# Patient Record
Sex: Female | Born: 1955
Health system: Southern US, Community
[De-identification: ages and names within clinical notes are randomized; demographics above are authoritative.]

## PROBLEM LIST (undated history)

## (undated) DIAGNOSIS — Z21 Asymptomatic human immunodeficiency virus [HIV] infection status: Secondary | ICD-10-CM

## (undated) DIAGNOSIS — I700 Atherosclerosis of aorta: Secondary | ICD-10-CM

## (undated) DIAGNOSIS — Z95828 Presence of other vascular implants and grafts: Secondary | ICD-10-CM

## (undated) DIAGNOSIS — I10 Essential (primary) hypertension: Secondary | ICD-10-CM

## (undated) DIAGNOSIS — K760 Fatty (change of) liver, not elsewhere classified: Secondary | ICD-10-CM

## (undated) DIAGNOSIS — B20 Human immunodeficiency virus [HIV] disease: Secondary | ICD-10-CM

## (undated) DIAGNOSIS — Z853 Personal history of malignant neoplasm of breast: Secondary | ICD-10-CM

## (undated) DIAGNOSIS — C50919 Malignant neoplasm of unspecified site of unspecified female breast: Secondary | ICD-10-CM

## (undated) HISTORY — DX: Malignant neoplasm of unspecified site of unspecified female breast: C50.919

## (undated) HISTORY — DX: Personal history of malignant neoplasm of breast: Z85.3

## (undated) HISTORY — DX: Essential (primary) hypertension: I10

## (undated) HISTORY — PX: BREAST LUMPECTOMY: SHX2

## (undated) HISTORY — DX: Human immunodeficiency virus (HIV) disease: B20

## (undated) HISTORY — DX: Asymptomatic human immunodeficiency virus (hiv) infection status: Z21

---

## 1991-11-03 HISTORY — PX: OTHER SURGICAL HISTORY: SHX169

## 1998-11-07 ENCOUNTER — Other Ambulatory Visit: Admission: RE | Admit: 1998-11-07 | Discharge: 1998-11-07 | Payer: Self-pay | Admitting: Obstetrics and Gynecology

## 2000-01-29 ENCOUNTER — Other Ambulatory Visit: Admission: RE | Admit: 2000-01-29 | Discharge: 2000-01-29 | Payer: Self-pay | Admitting: Obstetrics and Gynecology

## 2001-02-15 ENCOUNTER — Other Ambulatory Visit: Admission: RE | Admit: 2001-02-15 | Discharge: 2001-02-15 | Payer: Self-pay | Admitting: Obstetrics and Gynecology

## 2002-02-28 ENCOUNTER — Other Ambulatory Visit: Admission: RE | Admit: 2002-02-28 | Discharge: 2002-02-28 | Payer: Self-pay | Admitting: Obstetrics and Gynecology

## 2002-11-30 ENCOUNTER — Ambulatory Visit (HOSPITAL_COMMUNITY): Admission: RE | Admit: 2002-11-30 | Discharge: 2002-11-30 | Payer: Self-pay | Admitting: General Surgery

## 2002-11-30 ENCOUNTER — Encounter (INDEPENDENT_AMBULATORY_CARE_PROVIDER_SITE_OTHER): Payer: Self-pay | Admitting: *Deleted

## 2003-08-06 ENCOUNTER — Other Ambulatory Visit: Admission: RE | Admit: 2003-08-06 | Discharge: 2003-08-06 | Payer: Self-pay | Admitting: Obstetrics and Gynecology

## 2005-04-07 ENCOUNTER — Encounter: Admission: RE | Admit: 2005-04-07 | Discharge: 2005-04-07 | Payer: Self-pay | Admitting: Obstetrics and Gynecology

## 2005-04-27 ENCOUNTER — Ambulatory Visit: Payer: Self-pay | Admitting: Oncology

## 2005-05-18 ENCOUNTER — Ambulatory Visit (HOSPITAL_COMMUNITY): Admission: RE | Admit: 2005-05-18 | Discharge: 2005-05-18 | Payer: Self-pay | Admitting: Oncology

## 2005-05-26 ENCOUNTER — Ambulatory Visit (HOSPITAL_COMMUNITY): Admission: RE | Admit: 2005-05-26 | Discharge: 2005-05-26 | Payer: Self-pay | Admitting: Infectious Diseases

## 2005-05-26 ENCOUNTER — Ambulatory Visit: Payer: Self-pay | Admitting: Infectious Diseases

## 2005-05-26 ENCOUNTER — Encounter (INDEPENDENT_AMBULATORY_CARE_PROVIDER_SITE_OTHER): Payer: Self-pay | Admitting: *Deleted

## 2005-06-04 ENCOUNTER — Ambulatory Visit: Payer: Self-pay | Admitting: Infectious Diseases

## 2005-06-19 ENCOUNTER — Ambulatory Visit: Payer: Self-pay | Admitting: Infectious Diseases

## 2005-08-18 ENCOUNTER — Ambulatory Visit (HOSPITAL_COMMUNITY): Admission: RE | Admit: 2005-08-18 | Discharge: 2005-08-18 | Payer: Self-pay | Admitting: Infectious Diseases

## 2005-08-18 ENCOUNTER — Ambulatory Visit: Payer: Self-pay | Admitting: Infectious Diseases

## 2005-09-17 ENCOUNTER — Ambulatory Visit: Payer: Self-pay | Admitting: Infectious Diseases

## 2005-11-09 ENCOUNTER — Ambulatory Visit: Payer: Self-pay | Admitting: Infectious Diseases

## 2005-11-09 ENCOUNTER — Encounter (INDEPENDENT_AMBULATORY_CARE_PROVIDER_SITE_OTHER): Payer: Self-pay | Admitting: *Deleted

## 2005-11-09 ENCOUNTER — Ambulatory Visit (HOSPITAL_COMMUNITY): Admission: RE | Admit: 2005-11-09 | Discharge: 2005-11-09 | Payer: Self-pay | Admitting: Infectious Diseases

## 2005-11-09 LAB — CONVERTED CEMR LAB: HIV 1 RNA Quant: 399 copies/mL

## 2005-11-26 ENCOUNTER — Ambulatory Visit: Payer: Self-pay | Admitting: Infectious Diseases

## 2006-01-13 ENCOUNTER — Ambulatory Visit: Payer: Self-pay | Admitting: Infectious Diseases

## 2006-02-23 ENCOUNTER — Ambulatory Visit: Payer: Self-pay | Admitting: Infectious Diseases

## 2006-02-23 ENCOUNTER — Encounter (INDEPENDENT_AMBULATORY_CARE_PROVIDER_SITE_OTHER): Payer: Self-pay | Admitting: *Deleted

## 2006-02-23 ENCOUNTER — Encounter: Admission: RE | Admit: 2006-02-23 | Discharge: 2006-02-23 | Payer: Self-pay | Admitting: Infectious Diseases

## 2006-02-23 LAB — CONVERTED CEMR LAB: CD4 Count: 390 microliters

## 2006-03-11 ENCOUNTER — Ambulatory Visit: Payer: Self-pay | Admitting: Infectious Diseases

## 2006-04-09 ENCOUNTER — Ambulatory Visit: Payer: Self-pay | Admitting: Infectious Diseases

## 2006-06-09 ENCOUNTER — Encounter: Admission: RE | Admit: 2006-06-09 | Discharge: 2006-06-09 | Payer: Self-pay | Admitting: Infectious Diseases

## 2006-06-09 ENCOUNTER — Ambulatory Visit: Payer: Self-pay | Admitting: Infectious Diseases

## 2006-07-15 ENCOUNTER — Ambulatory Visit: Payer: Self-pay | Admitting: Infectious Diseases

## 2006-10-25 ENCOUNTER — Encounter: Admission: RE | Admit: 2006-10-25 | Discharge: 2006-10-25 | Payer: Self-pay | Admitting: Gastroenterology

## 2006-10-25 ENCOUNTER — Encounter (INDEPENDENT_AMBULATORY_CARE_PROVIDER_SITE_OTHER): Payer: Self-pay | Admitting: *Deleted

## 2006-10-25 ENCOUNTER — Ambulatory Visit: Payer: Self-pay | Admitting: Infectious Diseases

## 2006-10-25 LAB — CONVERTED CEMR LAB
AST: 28 units/L (ref 0–37)
Albumin: 4.2 g/dL (ref 3.5–5.2)
BUN: 14 mg/dL (ref 6–23)
Basophils Absolute: 0 10*3/uL (ref 0.0–0.1)
Basophils Relative: 0 % (ref 0–1)
CD4 Count: 350 microliters
CO2: 21 meq/L (ref 19–32)
Calcium: 9 mg/dL (ref 8.4–10.5)
Cholesterol: 174 mg/dL (ref 0–200)
Creatinine, Ser: 0.76 mg/dL (ref 0.40–1.20)
Glucose, Bld: 88 mg/dL (ref 70–99)
HCT: 39.1 % (ref 36.0–46.0)
HIV-1 RNA Quant, Log: <1.7 (ref <1.70)
Lymphs Abs: 1.3 10*3/uL (ref 0.7–3.3)
MCHC: 32 g/dL (ref 30.0–36.0)
Neutro Abs: 1.4 10*3/uL — ABNORMAL LOW (ref 1.7–7.7)
Platelets: 185 10*3/uL (ref 150–400)
Potassium: 3.9 meq/L (ref 3.5–5.3)
RBC: 4.41 M/uL (ref 3.87–5.11)
RDW: 14.7 % — ABNORMAL HIGH (ref 11.5–14.0)
WBC: 2.9 10*3/uL — ABNORMAL LOW (ref 4.0–10.5)

## 2006-11-11 ENCOUNTER — Ambulatory Visit: Payer: Self-pay | Admitting: Infectious Diseases

## 2006-12-27 ENCOUNTER — Encounter (INDEPENDENT_AMBULATORY_CARE_PROVIDER_SITE_OTHER): Payer: Self-pay | Admitting: *Deleted

## 2006-12-27 LAB — CONVERTED CEMR LAB

## 2007-01-09 ENCOUNTER — Encounter (INDEPENDENT_AMBULATORY_CARE_PROVIDER_SITE_OTHER): Payer: Self-pay | Admitting: *Deleted

## 2007-03-29 ENCOUNTER — Ambulatory Visit: Payer: Self-pay | Admitting: Infectious Diseases

## 2007-03-29 ENCOUNTER — Encounter: Admission: RE | Admit: 2007-03-29 | Discharge: 2007-03-29 | Payer: Self-pay | Admitting: Infectious Diseases

## 2007-03-29 LAB — CONVERTED CEMR LAB
ALT: 24 units/L (ref 0–35)
Basophils Relative: 0 % (ref 0–1)
Chloride: 107 meq/L (ref 96–112)
Eosinophils Relative: 2 % (ref 0–5)
Glucose, Bld: 99 mg/dL (ref 70–99)
HCT: 37.7 % (ref 36.0–46.0)
Lymphocytes Relative: 43 % (ref 12–46)
Lymphs Abs: 1.3 10*3/uL (ref 0.7–3.3)
MCHC: 32.9 g/dL (ref 30.0–36.0)
MCV: 87.3 fL (ref 78.0–100.0)
Monocytes Relative: 4 % (ref 3–11)
Neutro Abs: 1.5 10*3/uL — ABNORMAL LOW (ref 1.7–7.7)

## 2007-04-14 ENCOUNTER — Ambulatory Visit: Payer: Self-pay | Admitting: Infectious Diseases

## 2007-04-14 DIAGNOSIS — B20 Human immunodeficiency virus [HIV] disease: Secondary | ICD-10-CM

## 2007-04-25 ENCOUNTER — Ambulatory Visit (HOSPITAL_COMMUNITY): Admission: RE | Admit: 2007-04-25 | Discharge: 2007-04-25 | Payer: Self-pay | Admitting: Gastroenterology

## 2007-07-28 ENCOUNTER — Ambulatory Visit: Payer: Self-pay | Admitting: Infectious Diseases

## 2007-07-28 ENCOUNTER — Encounter: Admission: RE | Admit: 2007-07-28 | Discharge: 2007-07-28 | Payer: Self-pay | Admitting: Infectious Diseases

## 2007-07-28 LAB — CONVERTED CEMR LAB
ALT: 22 units/L (ref 0–35)
AST: 21 units/L (ref 0–37)
Alkaline Phosphatase: 149 units/L — ABNORMAL HIGH (ref 39–117)
CO2: 20 meq/L (ref 19–32)
Calcium: 9.3 mg/dL (ref 8.4–10.5)
Chloride: 109 meq/L (ref 96–112)
Eosinophils Absolute: 0.1 10*3/uL (ref 0.0–0.7)
Glucose, Bld: 95 mg/dL (ref 70–99)
HCT: 37.4 % (ref 36.0–46.0)
Lymphocytes Relative: 53 % — ABNORMAL HIGH (ref 12–46)
Lymphs Abs: 1.7 10*3/uL (ref 0.7–3.3)
Monocytes Relative: 6 % (ref 3–11)
Neutro Abs: 1.3 10*3/uL — ABNORMAL LOW (ref 1.7–7.7)
Neutrophils Relative %: 39 % — ABNORMAL LOW (ref 43–77)
Potassium: 4.1 meq/L (ref 3.5–5.3)
RBC: 4.36 M/uL (ref 3.87–5.11)
Sodium: 139 meq/L (ref 135–145)
Total Bilirubin: 0.5 mg/dL (ref 0.3–1.2)

## 2007-08-11 ENCOUNTER — Ambulatory Visit: Payer: Self-pay | Admitting: Infectious Diseases

## 2007-08-11 DIAGNOSIS — I10 Essential (primary) hypertension: Secondary | ICD-10-CM

## 2007-11-01 ENCOUNTER — Encounter: Payer: Self-pay | Admitting: Infectious Diseases

## 2007-12-07 ENCOUNTER — Ambulatory Visit: Payer: Self-pay | Admitting: Infectious Diseases

## 2007-12-07 ENCOUNTER — Encounter: Admission: RE | Admit: 2007-12-07 | Discharge: 2007-12-07 | Payer: Self-pay | Admitting: Infectious Diseases

## 2007-12-07 LAB — CONVERTED CEMR LAB
ALT: 26 units/L (ref 0–35)
BUN: 22 mg/dL (ref 6–23)
CO2: 25 meq/L (ref 19–32)
Cholesterol: 185 mg/dL (ref 0–200)
Eosinophils Absolute: 0.1 10*3/uL (ref 0.0–0.7)
Glucose, Bld: 89 mg/dL (ref 70–99)
HIV 1 RNA Quant: 98 copies/mL — ABNORMAL HIGH (ref <50)
HIV-1 RNA Quant, Log: 1.99 — ABNORMAL HIGH (ref <1.70)
MCHC: 33 g/dL (ref 30.0–36.0)
Monocytes Absolute: 0.2 10*3/uL (ref 0.1–1.0)
Neutro Abs: 1.9 10*3/uL (ref 1.7–7.7)
Platelets: 247 10*3/uL (ref 150–400)
Potassium: 4 meq/L (ref 3.5–5.3)
RBC: 4.48 M/uL (ref 3.87–5.11)
Total Bilirubin: 0.3 mg/dL (ref 0.3–1.2)
Total Protein: 8 g/dL (ref 6.0–8.3)
VLDL: 11 mg/dL (ref 0–40)

## 2007-12-08 ENCOUNTER — Telehealth: Payer: Self-pay | Admitting: Infectious Diseases

## 2007-12-22 ENCOUNTER — Ambulatory Visit: Payer: Self-pay | Admitting: Infectious Diseases

## 2008-06-11 ENCOUNTER — Encounter: Admission: RE | Admit: 2008-06-11 | Discharge: 2008-06-11 | Payer: Self-pay | Admitting: Infectious Diseases

## 2008-06-11 ENCOUNTER — Ambulatory Visit: Payer: Self-pay | Admitting: Infectious Diseases

## 2008-06-11 LAB — CONVERTED CEMR LAB
ALT: 23 units/L (ref 0–35)
AST: 27 units/L (ref 0–37)
Basophils Relative: 0 % (ref 0–1)
Calcium: 9 mg/dL (ref 8.4–10.5)
Chloride: 107 meq/L (ref 96–112)
Eosinophils Relative: 1 % (ref 0–5)
HCT: 36.5 % (ref 36.0–46.0)
HIV-1 RNA Quant, Log: 1.91 — ABNORMAL HIGH (ref <1.70)
Hemoglobin: 11.9 g/dL — ABNORMAL LOW (ref 12.0–15.0)
Lymphs Abs: 1.2 10*3/uL (ref 0.7–4.0)
MCV: 88.8 fL (ref 78.0–100.0)
Neutrophils Relative %: 58 % (ref 43–77)
RBC: 4.11 M/uL (ref 3.87–5.11)
Total Bilirubin: 0.2 mg/dL — ABNORMAL LOW (ref 0.3–1.2)
WBC: 3.4 10*3/uL — ABNORMAL LOW (ref 4.0–10.5)

## 2008-06-28 ENCOUNTER — Ambulatory Visit: Payer: Self-pay | Admitting: Infectious Diseases

## 2008-11-13 ENCOUNTER — Encounter: Payer: Self-pay | Admitting: Infectious Diseases

## 2008-11-13 ENCOUNTER — Encounter (INDEPENDENT_AMBULATORY_CARE_PROVIDER_SITE_OTHER): Payer: Self-pay | Admitting: *Deleted

## 2008-11-13 LAB — CONVERTED CEMR LAB: Pap Smear: NORMAL

## 2008-11-14 ENCOUNTER — Encounter: Payer: Self-pay | Admitting: Infectious Diseases

## 2009-05-02 ENCOUNTER — Ambulatory Visit: Payer: Self-pay | Admitting: Infectious Diseases

## 2009-05-02 LAB — CONVERTED CEMR LAB
BUN: 17 mg/dL (ref 6–23)
Basophils Relative: 0 % (ref 0–1)
CO2: 23 meq/L (ref 19–32)
Creatinine, Ser: 0.83 mg/dL (ref 0.40–1.20)
Eosinophils Absolute: 0 10*3/uL (ref 0.0–0.7)
Glucose, Bld: 96 mg/dL (ref 70–99)
HDL: 86 mg/dL (ref >39)
HIV-1 RNA Quant, Log: <1.68 (ref <1.68)
Hemoglobin: 11.8 g/dL — ABNORMAL LOW (ref 12.0–15.0)
LDL Cholesterol: 79 mg/dL (ref 0–99)
Lymphs Abs: 1.4 10*3/uL (ref 0.7–4.0)
Monocytes Absolute: 0.2 10*3/uL (ref 0.1–1.0)
Potassium: 4.3 meq/L (ref 3.5–5.3)
Sodium: 144 meq/L (ref 135–145)
Total Bilirubin: 0.3 mg/dL (ref 0.3–1.2)
Total CHOL/HDL Ratio: 2
Total Protein: 7.2 g/dL (ref 6.0–8.3)
Triglycerides: 40 mg/dL (ref <150)

## 2009-05-16 ENCOUNTER — Ambulatory Visit: Payer: Self-pay | Admitting: Infectious Diseases

## 2009-10-17 ENCOUNTER — Encounter (INDEPENDENT_AMBULATORY_CARE_PROVIDER_SITE_OTHER): Payer: Self-pay | Admitting: *Deleted

## 2009-10-17 ENCOUNTER — Ambulatory Visit: Payer: Self-pay | Admitting: Infectious Diseases

## 2009-10-17 LAB — CONVERTED CEMR LAB
BUN: 11 mg/dL (ref 6–23)
Basophils Absolute: 0 10*3/uL (ref 0.0–0.1)
Calcium: 8.9 mg/dL (ref 8.4–10.5)
Cholesterol: 189 mg/dL (ref 0–200)
Creatinine, Ser: 0.74 mg/dL (ref 0.40–1.20)
Glucose, Bld: 89 mg/dL (ref 70–99)
HCT: 37.3 % (ref 36.0–46.0)
HDL: 94 mg/dL (ref >39)
HIV-1 RNA Quant, Log: 2.1 — ABNORMAL HIGH (ref <1.68)
Hemoglobin: 12.7 g/dL (ref 12.0–15.0)
LDL Cholesterol: 85 mg/dL (ref 0–99)
MCV: 85.6 fL (ref >=78.0)
Neutrophils Relative %: 33 % — ABNORMAL LOW (ref 43–77)
Sodium: 142 meq/L (ref 135–145)
Total Bilirubin: 0.3 mg/dL (ref 0.3–1.2)
VLDL: 10 mg/dL (ref 0–40)

## 2010-02-18 ENCOUNTER — Ambulatory Visit: Payer: Self-pay | Admitting: Infectious Diseases

## 2010-02-18 LAB — CONVERTED CEMR LAB
AST: 30 units/L (ref 0–37)
Albumin: 4.1 g/dL (ref 3.5–5.2)
Alkaline Phosphatase: 155 units/L — ABNORMAL HIGH (ref 39–117)
CO2: 21 meq/L (ref 19–32)
Calcium: 9.1 mg/dL (ref 8.4–10.5)
Cholesterol: 204 mg/dL — ABNORMAL HIGH (ref 0–200)
Creatinine, Ser: 0.81 mg/dL (ref 0.40–1.20)
Eosinophils Absolute: 0.1 10*3/uL (ref 0.0–0.7)
Eosinophils Relative: 2 % (ref 0–5)
HIV 1 RNA Quant: <48 copies/mL (ref <48)
HIV-1 RNA Quant, Log: <1.68 (ref <1.68)
LDL Cholesterol: 97 mg/dL (ref 0–99)
Monocytes Relative: 5 % (ref 3–12)
Platelets: 214 10*3/uL (ref 150–400)
RDW: 15.8 % — ABNORMAL HIGH (ref 11.5–15.5)
Sodium: 141 meq/L (ref 135–145)
Total Bilirubin: 0.3 mg/dL (ref 0.3–1.2)
Total Protein: 7.3 g/dL (ref 6.0–8.3)
Triglycerides: 57 mg/dL (ref <150)

## 2010-03-20 ENCOUNTER — Ambulatory Visit: Payer: Self-pay | Admitting: Infectious Diseases

## 2010-11-24 ENCOUNTER — Encounter (INDEPENDENT_AMBULATORY_CARE_PROVIDER_SITE_OTHER): Payer: Self-pay | Admitting: *Deleted

## 2010-12-02 NOTE — Assessment & Plan Note (Signed)
Summary: 4month f/u/vs   CC:  follow-up visit.  History of Present Illness: Victoria Coffey continues to be very well controled on Atriple withmost recent [id number] and HIV<48 copies. Other blood work is normal and she feels perfectly healthy without complaints.   Preventive Screening-Counseling & Management  Alcohol-Tobacco     Alcohol drinks/day: 0     Smoking Status: never  Caffeine-Diet-Exercise     Caffeine use/day: 0     Does Patient Exercise: yes     Type of exercise: spinning     Exercise (avg: min/session): >60     Times/week: 5  Hep-HIV-STD-Contraception     HIV Risk: no risk noted  Safety-Violence-Falls     Seat Belt Use: yes  Comments: declined condoms      Sexual History:  currently monogamous.        Drug Use:  never.     Current Allergies (reviewed today): No known allergies  Social History: Sexual History:  currently monogamous  Vital Signs:  Patient profile:   55 year old female Menstrual status:  postmenopausal Height:      61 inches (154.94 cm) Weight:      128 pounds (58.18 kg) BMI:     24.27 Temp:     97.6 degrees F (36.44 degrees C) oral Pulse rate:   65 / minute BP sitting:   154 / 89  (left arm) Cuff size:   regular  Vitals Entered By: Jennet Maduro RN (Mar 20, 2010 9:50 AM) CC: follow-up visit Is Patient Diabetic? No Pain Assessment Patient in pain? no      Nutritional Status BMI of 19 -24 = normal Nutritional Status Detail appetite "GOOD"  Have you ever been in a relationship where you felt threatened, hurt or afraid?No   Does patient need assistance? Functional Status Self care Ambulation Normal Comments no missed doses of meds   Physical Exam  General:  alert and well-developed.   Mouth:  good dentition and pharynx pink and moist.   Neck:  supple, full ROM, and no masses.   Lungs:  normal respiratory effort, no intercostal retractions, and no accessory muscle use.   Heart:  normal rate and no murmur.     Impression &  Recommendations:  Problem # 1:  HIV DISEASE (ICD-042)  Orders: Est. Patient Level IV (04540) Est. Patient Level III (99213)Future Orders: T-CBC w/Diff ([id number]) ... 08/04/2010 T-CD4SP ([facility]) (CD4SP) ... 08/04/2010 T-Comprehensive Metabolic Panel [phone]) ... 08/04/2010 T-HIV Viral Load [phone]) ... 08/04/2010 Controlled and contiiue [name]. RTC 5 monhts.  Patient Instructions: 1)  Please schedule a follow-up appointment in 4-5 months. 2)  Be sure to return for lab work one (1) week before your next appointment as scheduled.    Process Orders Check Orders Results:     [facility]: ABN not required for this insurance Tests Sent for requisitioning (Mar 20, 2010 12:45 PM):     08/04/2010: [facility] -- T-CBC w/Diff [[id number]] (signed)     08/04/2010: [facility] -- T-Comprehensive Metabolic Panel [[id number]] (signed)     08/04/2010: [facility] -- T-HIV Viral Load [phone] (signed)

## 2010-12-02 NOTE — Miscellaneous (Signed)
Summary: Orders Update - labs  Clinical Lists Changes  Problems: Added new problem of ENCOUNTER FOR LONG-TERM USE OF OTHER MEDICATIONS (ICD-V58.69) Orders: Added new Test order of T-CBC w/Diff [phone]) - Signed Added new Test order of T-CD4SP [facility]) (CD4SP) - Signed Added new Test order of T-Comprehensive Metabolic Panel [phone]) - Signed Added new Test order of T-HIV Viral Load [phone]) - Signed Added new Test order of T-RPR (Syphilis) [phone]) - Signed Added new Test order of T-Lipid Profile ([id number]) - Signed     Process Orders Check Orders Results:     [facility]: ABN not required for this insurance Order queued for requisitioning for [facility]: February 18, 2010 8:36 AM  Tests Sent for requisitioning (February 18, 2010 8:36 AM):     02/18/2010: [facility] -- T-CBC w/Diff [[id number]] (signed)     02/18/2010: [facility] -- T-Comprehensive Metabolic Panel [[id number]] (signed)     02/18/2010: [facility] -- T-HIV Viral Load [phone] (signed)     02/18/2010: [facility] -- T-RPR (Syphilis) [phone] (signed)     02/18/2010: [facility] -- T-Lipid Profile [phone] (signed)

## 2010-12-04 NOTE — Miscellaneous (Signed)
Summary: RW Update  Clinical Lists Changes  Observations: Added new observation of INCOMESOURCE: unknown (11/24/2010 12:00) Added new observation of HOUSEINCOME: 0  (11/24/2010 12:00)

## 2011-01-20 LAB — T-HELPER CELL (CD4) - (RCID CLINIC ONLY)
CD4 % Helper T Cell: 35 % (ref 33–55)
CD4 T Cell Abs: 540 uL (ref 400–2700)

## 2011-02-02 LAB — T-HELPER CELL (CD4) - (RCID CLINIC ONLY): CD4 % Helper T Cell: 36 % (ref 33–55)

## 2011-02-08 LAB — T-HELPER CELL (CD4) - (RCID CLINIC ONLY): CD4 % Helper T Cell: 35 % (ref 33–55)

## 2011-03-20 NOTE — Op Note (Signed)
   NAME:  Victoria Coffey, Victoria Coffey                           ACCOUNT NO.:  [id number]   MEDICAL RECORD NO.:  [id number]                   PATIENT TYPE:  OIB   LOCATION:  2899                                 FACILITY:  [facility]   PHYSICIAN:  Leonie Man, M.D.                DATE OF BIRTH:  03-06-1956   DATE OF PROCEDURE:  11/30/2002  DATE OF DISCHARGE:                                 OPERATIVE REPORT   PREOPERATIVE DIAGNOSIS:  Left cervical lymphadenopathy, rule out lymphoma.   POSTOPERATIVE DIAGNOSIS:  Left cervical lymphadenopathy, rule out lymphoma.  Pathology pending.   OPERATION PERFORMED:  Excisional biopsy of left cervical lymph nodes.   SURGEON:  Leonie Man, M.D.   ASSISTANT:  Nurse.   ANESTHESIA:  General.   INDICATIONS FOR PROCEDURE:  The patient is a 55 year old woman who is status  post left-sided breast cancer approximately ten years ago and had been on  Tamoxifen and did well.  She presents now with bilateral cervical  lymphadenopathy without pain, treated briefly with antibiotics without any  resolution and comes now to the operating room for biopsy of lymph nodes on  the left side.  The risks and potential benefits of surgery have been fully  discussed with the patient and her spouse.  They understand these risks.  All questions answered and consent obtained.   DESCRIPTION OF PROCEDURE:  Following induction of satisfactory general  anesthesia with the head and neck turned to the right, the neck was prepped  and draped to be included in a sterile operative field.  I infiltrated the  region over the lymph nodes with 0.5% Marcaine with epinephrine.  I made a  transverse incision in the skin and deepened this through the skin and  subcutaneous tissue carrying dissection down to the region of the lymph  nodes.  A lymph nodes was dissected free on all sides and removed, forwarded  for pathologic evaluation.  Two separate lymph nodes were removed.  Hemostasis was assured with  electrocautery.  Sponge and instrument count  verified.  The subcutaneous tissues were then closed with interrupted 3-0  Vicryl sutures and the skin closed with a running 5-0 Monocryl.  This was  then reinforced with Steri-Strips.  A sterile dressing applied.  Anesthetic  was reversed and the patient removed from the operating room to the recovery  room in stable condition, having tolerated the procedure well.                                                Leonie Man, M.D.    PB/MEDQ  D:  11/30/2002  T:  11/30/2002  Job:  [id number]

## 2011-04-01 ENCOUNTER — Other Ambulatory Visit: Payer: Self-pay | Admitting: *Deleted

## 2011-04-01 DIAGNOSIS — B20 Human immunodeficiency virus [HIV] disease: Secondary | ICD-10-CM

## 2011-04-01 DIAGNOSIS — Z21 Asymptomatic human immunodeficiency virus [HIV] infection status: Secondary | ICD-10-CM

## 2011-04-01 MED ORDER — EFAVIRENZ-EMTRICITAB-TENOFOVIR 600-200-300 MG PO TABS: 1.0000 | ORAL_TABLET | Freq: Every day | ORAL | Status: DC

## 2011-06-25 ENCOUNTER — Other Ambulatory Visit: Payer: Self-pay | Admitting: Infectious Diseases

## 2011-06-25 ENCOUNTER — Other Ambulatory Visit: Payer: Self-pay

## 2011-06-25 DIAGNOSIS — B20 Human immunodeficiency virus [HIV] disease: Secondary | ICD-10-CM

## 2011-06-25 LAB — CBC WITH DIFFERENTIAL/PLATELET
Basophils Absolute: 0 10*3/uL (ref 0.0–0.1)
HCT: 38.7 % (ref 36.0–46.0)
Lymphocytes Relative: 40 % (ref 12–46)
Lymphs Abs: 1.3 10*3/uL (ref 0.7–4.0)
Monocytes Absolute: 0.2 10*3/uL (ref 0.1–1.0)
Neutro Abs: 1.6 10*3/uL — ABNORMAL LOW (ref 1.7–7.7)
RBC: 4.43 MIL/uL (ref 3.87–5.11)
RDW: 15.2 % (ref 11.5–15.5)
WBC: 3.2 10*3/uL — ABNORMAL LOW (ref 4.0–10.5)

## 2011-06-25 LAB — COMPLETE METABOLIC PANEL WITH GFR
ALT: 21 U/L (ref 0–35)
AST: 27 U/L (ref 0–37)
Albumin: 4 g/dL (ref 3.5–5.2)
BUN: 17 mg/dL (ref 6–23)
Calcium: 9.5 mg/dL (ref 8.4–10.5)
Chloride: 107 mEq/L (ref 96–112)
Potassium: 4.8 mEq/L (ref 3.5–5.3)

## 2011-06-26 LAB — HIV-1 RNA QUANT-NO REFLEX-BLD
HIV 1 RNA Quant: <20 copies/mL (ref <20)
HIV-1 RNA Quant, Log: <1.3 {Log} (ref <1.30)

## 2011-07-31 LAB — T-HELPER CELL (CD4) - (RCID CLINIC ONLY)
CD4 % Helper T Cell: 33
CD4 T Cell Abs: 350 — ABNORMAL LOW

## 2011-08-13 LAB — T-HELPER CELL (CD4) - (RCID CLINIC ONLY): CD4 % Helper T Cell: 36

## 2011-08-27 ENCOUNTER — Encounter: Payer: Self-pay | Admitting: Infectious Diseases

## 2011-08-27 ENCOUNTER — Ambulatory Visit: Payer: 59 | Admitting: Infectious Diseases

## 2011-08-27 VITALS — BP 158/86 | HR 66 | Temp 97.5°F | Wt 128.0 lb

## 2011-08-27 DIAGNOSIS — B20 Human immunodeficiency virus [HIV] disease: Secondary | ICD-10-CM

## 2011-08-27 NOTE — Patient Instructions (Signed)
Please return in 4-5 months, labs 2 weeks prior

## 2011-09-28 ENCOUNTER — Other Ambulatory Visit: Payer: Self-pay | Admitting: *Deleted

## 2011-09-28 DIAGNOSIS — B20 Human immunodeficiency virus [HIV] disease: Secondary | ICD-10-CM

## 2011-09-28 MED ORDER — EFAVIRENZ-EMTRICITAB-TENOFOVIR 600-200-300 MG PO TABS: 1.0000 | ORAL_TABLET | Freq: Every day | ORAL | Status: DC

## 2012-01-27 ENCOUNTER — Other Ambulatory Visit: Payer: Self-pay | Admitting: Infectious Diseases

## 2012-01-27 DIAGNOSIS — Z79899 Other long term (current) drug therapy: Secondary | ICD-10-CM

## 2012-01-27 DIAGNOSIS — Z113 Encounter for screening for infections with a predominantly sexual mode of transmission: Secondary | ICD-10-CM

## 2012-02-05 ENCOUNTER — Other Ambulatory Visit: Payer: Self-pay | Admitting: *Deleted

## 2012-02-05 DIAGNOSIS — Z113 Encounter for screening for infections with a predominantly sexual mode of transmission: Secondary | ICD-10-CM

## 2012-03-31 ENCOUNTER — Other Ambulatory Visit: Payer: Self-pay | Admitting: *Deleted

## 2012-03-31 ENCOUNTER — Other Ambulatory Visit: Payer: 59

## 2012-03-31 DIAGNOSIS — Z113 Encounter for screening for infections with a predominantly sexual mode of transmission: Secondary | ICD-10-CM

## 2012-03-31 DIAGNOSIS — B20 Human immunodeficiency virus [HIV] disease: Secondary | ICD-10-CM

## 2012-03-31 DIAGNOSIS — Z79899 Other long term (current) drug therapy: Secondary | ICD-10-CM

## 2012-03-31 LAB — CBC WITH DIFFERENTIAL/PLATELET
Basophils Absolute: 0 K/uL (ref 0.0–0.1)
Basophils Relative: 1 % (ref 0–1)
Eosinophils Absolute: 0.1 K/uL (ref 0.0–0.7)
Eosinophils Relative: 1 % (ref 0–5)
HCT: 37.9 % (ref 36.0–46.0)
Hemoglobin: 12.2 g/dL (ref 12.0–15.0)
Lymphocytes Relative: 40 % (ref 12–46)
Lymphs Abs: 1.4 K/uL (ref 0.7–4.0)
MCH: 28.6 pg (ref 26.0–34.0)
MCHC: 32.2 g/dL (ref 30.0–36.0)
MCV: 89 fL (ref 78.0–100.0)
Monocytes Absolute: 0.1 K/uL (ref 0.1–1.0)
Monocytes Relative: 3 % (ref 3–12)
Neutro Abs: 2 K/uL (ref 1.7–7.7)
Neutrophils Relative %: 55 % (ref 43–77)
Platelets: 229 K/uL (ref 150–400)
RBC: 4.26 MIL/uL (ref 3.87–5.11)
RDW: 15.6 % — ABNORMAL HIGH (ref 11.5–15.5)
WBC: 3.6 K/uL — ABNORMAL LOW (ref 4.0–10.5)

## 2012-03-31 LAB — LIPID PANEL
HDL: 77 mg/dL (ref >39)
LDL Cholesterol: 108 mg/dL — ABNORMAL HIGH (ref 0–99)
Total CHOL/HDL Ratio: 2.5 Ratio
Triglycerides: 44 mg/dL (ref <150)
VLDL: 9 mg/dL (ref 0–40)

## 2012-03-31 LAB — COMPLETE METABOLIC PANEL WITH GFR
ALT: 17 U/L (ref 0–35)
AST: 21 U/L (ref 0–37)
Albumin: 4.1 g/dL (ref 3.5–5.2)
Alkaline Phosphatase: 141 U/L — ABNORMAL HIGH (ref 39–117)
Calcium: 9 mg/dL (ref 8.4–10.5)
Chloride: 107 mEq/L (ref 96–112)
Potassium: 4.2 mEq/L (ref 3.5–5.3)
Sodium: 139 mEq/L (ref 135–145)
Total Protein: 6.9 g/dL (ref 6.0–8.3)

## 2012-03-31 MED ORDER — EFAVIRENZ-EMTRICITAB-TENOFOVIR 600-200-300 MG PO TABS: 1.0000 | ORAL_TABLET | Freq: Every day | ORAL | Status: DC

## 2012-03-31 NOTE — Telephone Encounter (Signed)
Patient in today for labs and advised she needs refills.

## 2012-04-01 LAB — HIV-1 RNA QUANT-NO REFLEX-BLD: HIV 1 RNA Quant: <20 copies/mL (ref <20)

## 2012-04-14 ENCOUNTER — Ambulatory Visit: Payer: 59 | Admitting: Infectious Diseases

## 2012-04-14 ENCOUNTER — Ambulatory Visit (INDEPENDENT_AMBULATORY_CARE_PROVIDER_SITE_OTHER): Payer: 59 | Admitting: Infectious Diseases

## 2012-04-14 ENCOUNTER — Encounter: Payer: Self-pay | Admitting: Infectious Diseases

## 2012-04-14 ENCOUNTER — Other Ambulatory Visit: Payer: Self-pay | Admitting: Infectious Diseases

## 2012-04-14 VITALS — BP 162/92 | HR 76 | Temp 97.2°F | Ht 61.0 in | Wt 130.0 lb

## 2012-04-14 DIAGNOSIS — B20 Human immunodeficiency virus [HIV] disease: Secondary | ICD-10-CM

## 2012-04-14 DIAGNOSIS — I10 Essential (primary) hypertension: Secondary | ICD-10-CM

## 2012-04-14 NOTE — Progress Notes (Signed)
Patient ID: Victoria Coffey, female   DOB: 04-07-1956, 56 y.o.   MRN: [id number] HIV f/u note   Victoria Coffey is perfectly healthy w/o complaints. She remains HIV suppressed on [name] and her screening labs and lipids are all acceptable. She does however have "white coat HTN" On arrival today her BP was 162/92 which I have documented on occasion in past and she never continued on meds. Her BPs in her gyn physician office have been more normal. She wants to wait on Rx and see if she can lose some weight. Suggested [employer] on line. Exam:BP 162/92  Pulse 76  Temp 97.2 F (36.2 C) (Oral)  Ht 5\' 1"  (1.549 m)  Wt 130 lb (58.968 kg)  BMI 24.56 kg/m2 No adenopathy. No rashes or thrush Impression Suppressed HIV and will continue [name] and f/u labs in 4 months and simply follow BP. [name]

## 2012-04-15 ENCOUNTER — Encounter: Payer: Self-pay | Admitting: *Deleted

## 2012-04-15 NOTE — Progress Notes (Signed)
Patient ID: Victoria Coffey, female   DOB: June 03, 1956, 56 y.o.   MRN: [id number] Documentation from Dr. Noland Fordyce that pt's annual PAP smear on 01/28/12 was negative.  Shared with Dr. Maurice March who signed the document.

## 2012-07-07 ENCOUNTER — Other Ambulatory Visit: Payer: 59

## 2012-07-07 DIAGNOSIS — B20 Human immunodeficiency virus [HIV] disease: Secondary | ICD-10-CM

## 2012-07-07 LAB — CBC WITH DIFFERENTIAL/PLATELET
Eosinophils Absolute: 0 10*3/uL (ref 0.0–0.7)
Eosinophils Relative: 1 % (ref 0–5)
Lymphs Abs: 1.4 10*3/uL (ref 0.7–4.0)
MCH: 28.9 pg (ref 26.0–34.0)
MCV: 86.3 fL (ref 78.0–100.0)
Monocytes Absolute: 0.2 10*3/uL (ref 0.1–1.0)
Monocytes Relative: 5 % (ref 3–12)
Platelets: 251 10*3/uL (ref 150–400)
RBC: 4.6 MIL/uL (ref 3.87–5.11)

## 2012-07-07 LAB — COMPREHENSIVE METABOLIC PANEL
Alkaline Phosphatase: 165 U/L — ABNORMAL HIGH (ref 39–117)
Glucose, Bld: 98 mg/dL (ref 70–99)
Sodium: 137 mEq/L (ref 135–145)
Total Bilirubin: 0.3 mg/dL (ref 0.3–1.2)
Total Protein: 7.2 g/dL (ref 6.0–8.3)

## 2012-07-08 LAB — T-HELPER CELL (CD4) - (RCID CLINIC ONLY): CD4 % Helper T Cell: 41 % (ref 33–55)

## 2012-07-22 ENCOUNTER — Ambulatory Visit (INDEPENDENT_AMBULATORY_CARE_PROVIDER_SITE_OTHER): Payer: 59 | Admitting: Infectious Diseases

## 2012-07-22 ENCOUNTER — Encounter: Payer: Self-pay | Admitting: Infectious Diseases

## 2012-07-22 VITALS — BP 142/91 | HR 74 | Temp 97.3°F | Ht 61.0 in | Wt 125.0 lb

## 2012-07-22 DIAGNOSIS — Z23 Encounter for immunization: Secondary | ICD-10-CM

## 2012-07-22 NOTE — Progress Notes (Signed)
Patient ID: Victoria Coffey, female   DOB: [birth year], 56 y.o.   MRN: [id number] HIV f/u  [name] has been well without any complaints and her preventive care is up to date and received flu vaccine today. She is entirely adherent to her [name] and her CD4 is 520 and HIV <20 copies which has been the pattern for the past several years. She has hypertension and takes ACEI + low dose HCTZ and visit BPs are always borderline at 140 +/80+ but are normal when taken at home.  BP 142/91  Pulse 74  Temp 97.3 F (36.3 C) (Oral)  Ht 5\' 1"  (1.549 m)  Wt 125 lb (56.7 kg)  BMI 23.62 kg/m2 Assessment HIV controlled and continue present regimen and same for hypertension. She may need more aggressive BP control as she ages. [name] will be cared for in the future by my new ID partner, Dr. Judyann Munson, anticipating my full retirement next summer. Scheduled for revisit 5-6 months with Dr. Drue Second. [name]

## 2012-07-22 NOTE — Patient Instructions (Addendum)
Please schedule 5 month ov with Dr. Drue Second, labs 2 weeks prior

## 2012-11-18 ENCOUNTER — Other Ambulatory Visit: Payer: 59

## 2012-12-02 ENCOUNTER — Ambulatory Visit: Payer: 59 | Admitting: Infectious Diseases

## 2012-12-06 ENCOUNTER — Other Ambulatory Visit (INDEPENDENT_AMBULATORY_CARE_PROVIDER_SITE_OTHER): Payer: 59

## 2012-12-06 DIAGNOSIS — B20 Human immunodeficiency virus [HIV] disease: Secondary | ICD-10-CM

## 2012-12-06 LAB — COMPREHENSIVE METABOLIC PANEL
AST: 34 U/L (ref 0–37)
Albumin: 4.1 g/dL (ref 3.5–5.2)
BUN: 15 mg/dL (ref 6–23)
Calcium: 9.6 mg/dL (ref 8.4–10.5)
Chloride: 108 mEq/L (ref 96–112)
Glucose, Bld: 92 mg/dL (ref 70–99)
Potassium: 4.8 mEq/L (ref 3.5–5.3)
Sodium: 140 mEq/L (ref 135–145)
Total Protein: 6.8 g/dL (ref 6.0–8.3)

## 2012-12-06 LAB — CBC WITH DIFFERENTIAL/PLATELET
HCT: 36.8 % (ref 36.0–46.0)
Hemoglobin: 12.4 g/dL (ref 12.0–15.0)
Lymphocytes Relative: 43 % (ref 12–46)
Monocytes Absolute: 0.2 10*3/uL (ref 0.1–1.0)
Monocytes Relative: 5 % (ref 3–12)
Neutro Abs: 1.6 10*3/uL — ABNORMAL LOW (ref 1.7–7.7)
RBC: 4.3 MIL/uL (ref 3.87–5.11)
WBC: 3.2 10*3/uL — ABNORMAL LOW (ref 4.0–10.5)

## 2012-12-07 LAB — T-HELPER CELL (CD4) - (RCID CLINIC ONLY)
CD4 % Helper T Cell: 39 % (ref 33–55)
CD4 T Cell Abs: 560 uL (ref 400–2700)

## 2012-12-07 LAB — HIV-1 RNA QUANT-NO REFLEX-BLD: HIV 1 RNA Quant: 41 copies/mL — ABNORMAL HIGH (ref <20)

## 2012-12-20 ENCOUNTER — Encounter: Payer: Self-pay | Admitting: Internal Medicine

## 2012-12-20 ENCOUNTER — Ambulatory Visit (INDEPENDENT_AMBULATORY_CARE_PROVIDER_SITE_OTHER): Payer: 59 | Admitting: Internal Medicine

## 2012-12-20 VITALS — BP 165/99 | HR 66 | Temp 97.5°F | Ht 60.0 in | Wt 133.0 lb

## 2012-12-20 DIAGNOSIS — B20 Human immunodeficiency virus [HIV] disease: Secondary | ICD-10-CM

## 2012-12-20 DIAGNOSIS — Z853 Personal history of malignant neoplasm of breast: Secondary | ICD-10-CM | POA: Insufficient documentation

## 2012-12-20 DIAGNOSIS — Z21 Asymptomatic human immunodeficiency virus [HIV] infection status: Secondary | ICD-10-CM

## 2012-12-20 MED ORDER — BENAZEPRIL-HYDROCHLOROTHIAZIDE 10-12.5 MG PO TABS: 1.0000 | ORAL_TABLET | Freq: Every day | ORAL | Status: DC

## 2012-12-20 MED ORDER — EFAVIRENZ-EMTRICITAB-TENOFOVIR 600-200-300 MG PO TABS: 1.0000 | ORAL_TABLET | Freq: Every day | ORAL | Status: DC

## 2012-12-20 NOTE — Progress Notes (Signed)
[facility]  RFV: routine visit Subjective:    Patient ID: Victoria Coffey, female    DOB: 1956-07-24, 57 y.o.   MRN: [id number]  HPI Victoria Coffey is a 57yo F with HIV and hx of breast ca,CD 4 count of 560/ VL 41, previously < 20, currently on atripla, 100% adherence. Started taking ARt at time of diagnosis. No Side effects associated with atripla.  Also has HTN, for which she has been trying to do diet control.  Increasing sterssor: her father passed away 3 years ago and her mother still having difficulty adjusting. Her mother resides in [place] but has been staying with her and her sister most recently.  Current Outpatient Prescriptions on File Prior to Visit  Medication Sig Dispense Refill  . efavirenz-emtrictabine-tenofovir ([name]) 600-200-300 MG per tablet Take 1 tablet by mouth daily.  30 tablet  11  . benazepril-hydrochlorthiazide (LOTENSIN HCT) 10-12.5 MG per tablet Take 1 tablet by mouth daily.         No current facility-administered medications on file prior to visit.   Active Ambulatory Problems    Diagnosis Date Noted  . HIV DISEASE 04/14/2007  . HYPERTENSION, BENIGN ESSENTIAL 08/11/2007   Resolved Ambulatory Problems    Diagnosis Date Noted  . No Resolved Ambulatory Problems   No Additional Past Medical History     Review of Systems     Objective:   Physical Exam BP 165/99  Pulse 66  Temp(Src) 97.5 F (36.4 C) (Oral)  Ht 5' (1.524 m)  Wt 133 lb (60.328 kg)  BMI 25.97 kg/m2 Physical Exam  Constitutional:  oriented to person, place, and time. appears well-developed and well-nourished. No distress.  HENT:  Mouth/Throat: Oropharynx is clear and moist. No oropharyngeal exudate.  Cardiovascular: Normal rate, regular rhythm and normal heart sounds. Exam reveals no gallop and no friction rub.  No murmur heard.  Pulmonary/Chest: Effort normal and breath sounds normal. No respiratory distress.  no wheezes.  Abdominal: Soft. Bowel sounds are normal. He exhibits no  distension. There is no tenderness.  Lymphadenopathy: mild subcm non tendner, mobile LN left ant cervical chain, upper Skin: Skin is warm and dry. No rash noted. No erythema.        Assessment & Plan:  HIV= continue with atripla  htn =start medication lisinopril 10/hctz 12.5. She plans to follow up with pcp in 4-6wk to adjust dose  Health maintenance = getting pap and mammo  in May; will need to get colon, prefers someone other than [facility]  rtc 6months   [facility] -> getting new pcp since her physician left/retired

## 2013-06-06 ENCOUNTER — Other Ambulatory Visit (INDEPENDENT_AMBULATORY_CARE_PROVIDER_SITE_OTHER): Payer: 59

## 2013-06-06 DIAGNOSIS — B20 Human immunodeficiency virus [HIV] disease: Secondary | ICD-10-CM

## 2013-06-06 LAB — CBC WITH DIFFERENTIAL/PLATELET
Basophils Relative: 0 % (ref 0–1)
Eosinophils Absolute: 0.1 10*3/uL (ref 0.0–0.7)
Eosinophils Relative: 3 % (ref 0–5)
Hemoglobin: 13 g/dL (ref 12.0–15.0)
MCH: 28.8 pg (ref 26.0–34.0)
MCHC: 34.2 g/dL (ref 30.0–36.0)
MCV: 84.3 fL (ref 78.0–100.0)
Monocytes Relative: 4 % (ref 3–12)
Neutrophils Relative %: 42 % — ABNORMAL LOW (ref 43–77)

## 2013-06-06 LAB — COMPLETE METABOLIC PANEL WITH GFR
ALT: 23 U/L (ref 0–35)
AST: 27 U/L (ref 0–37)
Alkaline Phosphatase: 130 U/L — ABNORMAL HIGH (ref 39–117)
Creat: 0.97 mg/dL (ref 0.50–1.10)
Total Bilirubin: 0.3 mg/dL (ref 0.3–1.2)

## 2013-06-07 LAB — HIV-1 RNA QUANT-NO REFLEX-BLD: HIV-1 RNA Quant, Log: <1.3 {Log} (ref <1.30)

## 2013-06-07 LAB — T-HELPER CELL (CD4) - (RCID CLINIC ONLY): CD4 T Cell Abs: 590 uL (ref 400–2700)

## 2013-06-20 ENCOUNTER — Encounter: Payer: Self-pay | Admitting: Internal Medicine

## 2013-06-20 ENCOUNTER — Ambulatory Visit (INDEPENDENT_AMBULATORY_CARE_PROVIDER_SITE_OTHER): Payer: 59 | Admitting: Internal Medicine

## 2013-06-20 VITALS — BP 125/76 | HR 78 | Temp 97.2°F | Wt 131.0 lb

## 2013-06-20 DIAGNOSIS — I10 Essential (primary) hypertension: Secondary | ICD-10-CM

## 2013-06-20 DIAGNOSIS — B20 Human immunodeficiency virus [HIV] disease: Secondary | ICD-10-CM

## 2013-06-20 DIAGNOSIS — Z21 Asymptomatic human immunodeficiency virus [HIV] infection status: Secondary | ICD-10-CM

## 2013-06-20 MED ORDER — EFAVIRENZ-EMTRICITAB-TENOFOVIR 600-200-300 MG PO TABS: 1.0000 | ORAL_TABLET | Freq: Every day | ORAL | Status: DC

## 2013-06-20 NOTE — Progress Notes (Signed)
[facility]  RFV: routine, 6 mo visit Subjective:    Patient ID: Victoria Coffey, female    DOB: August 21, 1956, 57 y.o.   MRN: [id number]  HPI Victoria Coffey is 57 yo F with HIV, CD 4 count of 590/VL<20, currently on [name]. Well controlled with excellent adherence. She denies any difficulties with her sleep in association with atripla. Doing well overall. No fever, chills, nightsweats, headache, weight loss, n/v/d, arthralgias  Current Outpatient Prescriptions on File Prior to Visit  Medication Sig Dispense Refill  . benazepril-hydrochlorthiazide (LOTENSIN HCT) 10-12.5 MG per tablet Take 1 tablet by mouth daily.  30 tablet  11  . efavirenz-emtricitabine-tenofovir ([name]) 600-200-300 MG per tablet Take 1 tablet by mouth daily.  60 tablet  6   No current facility-administered medications on file prior to visit.   Active Ambulatory Problems    Diagnosis Date Noted  . HIV DISEASE 04/14/2007  . HYPERTENSION, BENIGN ESSENTIAL 08/11/2007  . History of breast cancer 12/20/2012   Resolved Ambulatory Problems    Diagnosis Date Noted  . No Resolved Ambulatory Problems   No Additional Past Medical History     Review of Systems Review of Systems  Constitutional: Negative for fever, chills, diaphoresis, activity change, appetite change, fatigue and unexpected weight change.  HENT: Negative for congestion, sore throat, rhinorrhea, sneezing, trouble swallowing and sinus pressure.  Eyes: Negative for photophobia and visual disturbance.  Respiratory: Negative for cough, chest tightness, shortness of breath, wheezing and stridor.  Cardiovascular: Negative for chest pain, palpitations and leg swelling.  Gastrointestinal: Negative for nausea, vomiting, abdominal pain, diarrhea, constipation, blood in stool, abdominal distention and anal bleeding.  Genitourinary: Negative for dysuria, hematuria, flank pain and difficulty urinating.  Musculoskeletal: Negative for myalgias, back pain, joint swelling,  arthralgias and gait problem.  Skin: Negative for color change, pallor, rash and wound.  Neurological: Negative for dizziness, tremors, weakness and light-headedness.  Hematological: Negative for adenopathy. Does not bruise/bleed easily.  Psychiatric/Behavioral: Negative for behavioral problems, confusion, sleep disturbance, dysphoric mood, decreased concentration and agitation.       Objective:   Physical Exam BP 125/76  Pulse 78  Temp(Src) 97.2 F (36.2 C) (Oral)  Wt 131 lb (59.421 kg)  BMI 25.58 kg/m2  Constitutional: oriented to person, place, and time. appears well-developed and well-nourished. No distress.  HENT:  Mouth/Throat: Oropharynx is clear and moist. No oropharyngeal exudate.  Cardiovascular: Normal rate, regular rhythm and normal heart sounds. Exam reveals no gallop and no friction rub.  No murmur heard.  Pulmonary/Chest: Effort normal and breath sounds normal. No respiratory distress. no wheezes.  Abdominal: Soft. Bowel sounds are normal. exhibits no distension. There is no tenderness.  Lymphadenopathy:  no cervical adenopathy.  Neurological:  alert and oriented to person, place, and time.  Skin: Skin is warm and dry. No rash noted. No erythema.  Psychiatric:  a normal mood and affect.  behavior is normal.          Assessment & Plan:  HIV = continue with atripla  Health maintenance=  Will need to get flu vaccine in the Fall. Also will check if she is up for tetanus. Will need to schedule colonoscopy. Had 1st colonoscopy at 57yo. In setting of  Diagnosis/treatment of breast ca. Has been doing fecal occult blood cards in last 5 years.  Women's health/breast ca surveillance = getting pap and mammo through gyn/Dr. Yolanda Bonine in early Sept  HTN =well controlled on current medication  Return in 2-3 months for flu shot, then  to switch over to q 4month visits

## 2013-09-07 ENCOUNTER — Other Ambulatory Visit: Payer: Self-pay

## 2013-09-13 ENCOUNTER — Other Ambulatory Visit: Payer: 59

## 2013-09-13 DIAGNOSIS — Z113 Encounter for screening for infections with a predominantly sexual mode of transmission: Secondary | ICD-10-CM

## 2013-09-13 DIAGNOSIS — Z79899 Other long term (current) drug therapy: Secondary | ICD-10-CM

## 2013-09-13 DIAGNOSIS — B20 Human immunodeficiency virus [HIV] disease: Secondary | ICD-10-CM

## 2013-09-13 LAB — COMPREHENSIVE METABOLIC PANEL
Albumin: 4.1 g/dL (ref 3.5–5.2)
Alkaline Phosphatase: 144 U/L — ABNORMAL HIGH (ref 39–117)
CO2: 25 mEq/L (ref 19–32)
Calcium: 9.6 mg/dL (ref 8.4–10.5)
Chloride: 108 mEq/L (ref 96–112)
Glucose, Bld: 96 mg/dL (ref 70–99)
Potassium: 4.2 mEq/L (ref 3.5–5.3)
Sodium: 140 mEq/L (ref 135–145)
Total Protein: 7.1 g/dL (ref 6.0–8.3)

## 2013-09-13 LAB — CBC WITH DIFFERENTIAL/PLATELET
Basophils Absolute: 0 10*3/uL (ref 0.0–0.1)
Eosinophils Relative: 2 % (ref 0–5)
HCT: 37.3 % (ref 36.0–46.0)
Hemoglobin: 12.6 g/dL (ref 12.0–15.0)
Lymphocytes Relative: 55 % — ABNORMAL HIGH (ref 12–46)
Lymphs Abs: 1.6 10*3/uL (ref 0.7–4.0)
MCH: 29.1 pg (ref 26.0–34.0)
MCV: 86.1 fL (ref 78.0–100.0)
Monocytes Absolute: 0.2 10*3/uL (ref 0.1–1.0)
Monocytes Relative: 5 % (ref 3–12)
Neutro Abs: 1.1 10*3/uL — ABNORMAL LOW (ref 1.7–7.7)
Neutrophils Relative %: 38 % — ABNORMAL LOW (ref 43–77)
RBC: 4.33 MIL/uL (ref 3.87–5.11)

## 2013-09-13 LAB — LIPID PANEL
Cholesterol: 205 mg/dL — ABNORMAL HIGH (ref 0–200)
LDL Cholesterol: 103 mg/dL — ABNORMAL HIGH (ref 0–99)
Total CHOL/HDL Ratio: 2.2 Ratio
Triglycerides: 48 mg/dL (ref <150)

## 2013-09-13 LAB — RPR

## 2013-09-14 LAB — T-HELPER CELL (CD4) - (RCID CLINIC ONLY)
CD4 % Helper T Cell: 38 % (ref 33–55)
CD4 T Cell Abs: 670 /uL (ref 400–2700)

## 2013-09-15 LAB — HIV-1 RNA QUANT-NO REFLEX-BLD
HIV 1 RNA Quant: 45 copies/mL — ABNORMAL HIGH (ref <20)
HIV-1 RNA Quant, Log: 1.65 {Log} — ABNORMAL HIGH (ref <1.30)

## 2013-09-26 ENCOUNTER — Ambulatory Visit: Payer: 59 | Admitting: Internal Medicine

## 2013-10-04 ENCOUNTER — Encounter: Payer: Self-pay | Admitting: Internal Medicine

## 2013-10-04 ENCOUNTER — Ambulatory Visit (INDEPENDENT_AMBULATORY_CARE_PROVIDER_SITE_OTHER): Payer: 59 | Admitting: Internal Medicine

## 2013-10-04 VITALS — BP 148/88 | HR 69 | Temp 97.9°F | Wt 131.0 lb

## 2013-10-04 DIAGNOSIS — B20 Human immunodeficiency virus [HIV] disease: Secondary | ICD-10-CM

## 2013-10-04 NOTE — Progress Notes (Signed)
   Subjective:    Patient ID: Victoria Coffey, female    DOB: [birth year], 57 y.o.   MRN: [id number]  HPI Victoria Coffey is 57 yo F with HIV, CD 4 count of 670/VL<20, currently on [name]. Well controlled with excellent adherence. She denies any difficulties with her sleep in association with atripla. Doing well overall. No fever, chills, nightsweats, headache, weight loss, n/v/d, arthralgias. She has been following up with her pcp and oncologist for cancer screening. She states that her imaging has all been normal including pap smear.  Current Outpatient Prescriptions on File Prior to Visit  Medication Sig Dispense Refill  . benazepril-hydrochlorthiazide (LOTENSIN HCT) 10-12.5 MG per tablet Take 1 tablet by mouth daily.  30 tablet  11  . efavirenz-emtricitabine-tenofovir ([name]) 600-200-300 MG per tablet Take 1 tablet by mouth daily.  30 tablet  11   No current facility-administered medications on file prior to visit.   Active Ambulatory Problems    Diagnosis Date Noted  . HIV DISEASE 04/14/2007  . HYPERTENSION, BENIGN ESSENTIAL 08/11/2007  . History of breast cancer 12/20/2012   Resolved Ambulatory Problems    Diagnosis Date Noted  . No Resolved Ambulatory Problems   No Additional Past Medical History      Review of Systems   Constitutional: Negative for fever, chills, diaphoresis, activity change, appetite change, fatigue and unexpected weight change.  HENT: Negative for congestion, sore throat, rhinorrhea, sneezing, trouble swallowing and sinus pressure.  Eyes: Negative for photophobia and visual disturbance.  Respiratory: Negative for cough, chest tightness, shortness of breath, wheezing and stridor.  Cardiovascular: Negative for chest pain, palpitations and leg swelling.  Gastrointestinal: Negative for nausea, vomiting, abdominal pain, diarrhea, constipation, blood in stool, abdominal distention and anal bleeding.  Genitourinary: Negative for dysuria, hematuria, flank pain and  difficulty urinating.  Musculoskeletal: Negative for myalgias, back pain, joint swelling, arthralgias and gait problem.  Skin: Negative for color change, pallor, rash and wound.  Neurological: Negative for dizziness, tremors, weakness and light-headedness.  Hematological: Negative for adenopathy. Does not bruise/bleed easily.  Psychiatric/Behavioral: Negative for behavioral problems, confusion, sleep disturbance, dysphoric mood, decreased concentration and agitation.       Objective:   Physical Exam BP 148/88  Pulse 69  Temp(Src) 97.9 F (36.6 C) (Oral)  Wt 131 lb (59.421 kg) Physical Exam  Constitutional: oriented to person, place, and time. appears well-developed and well-nourished. No distress.  HENT:  Mouth/Throat: Oropharynx is clear and moist. No oropharyngeal exudate.  Cardiovascular: Normal rate, regular rhythm and normal heart sounds. Exam reveals no gallop and no friction rub.  No murmur heard.  Pulmonary/Chest: Effort normal and breath sounds normal. No respiratory distress. no wheezes.  Abdominal: Soft. Bowel sounds are normal. He exhibits no distension. There is no tenderness.  Lymphadenopathy: no cervical adenopathy.  Neurological:  alert and oriented to person, place, and time.  Skin: Skin is warm and dry. No rash noted. No erythema.  Psychiatric:a normal mood and affect.  behavior is normal.          Assessment & Plan:  hiv = continue on atripla. Stable.  Health maintenance = mammo and pap completed , csy pending, all arranged through PCP.   imms = flu received at pcp. tdap  Due next year. Getting through PCP

## 2013-11-06 ENCOUNTER — Telehealth: Payer: Self-pay | Admitting: *Deleted

## 2013-11-06 NOTE — Telephone Encounter (Signed)
Rash appeared on arms, legs 5 days ago. Bumpy "like a bumpy cucumber," denies SOB or swelling in face or neck.  Pt endorses eating a lot of ruby red grapefruits and using scented warming cubes in her home since Christmas.  Patient notified Rn that she is allergic to pomegranates, perfumes, shellfish, and potentially codeine.  Pt started over the counter Benadryl Friday, taking 1-2 tablets 2-3 times a day, states that the rash looks like it is drying out, but wants to know if she "can come in for a shot." Spoke with Dr. Tommy Medal.  Patient not in immediate danger, should continue the benadryl as the rash is drying up; pt should avoid so much grapefruit as it has many interactions with medications.  Offered patient appointment, given 10:15 on 1/7 with Dr. Johnnye Sima.  Patient will continue OTC benadryl, zantac as directed. Landis Gandy, RN

## 2013-11-08 ENCOUNTER — Ambulatory Visit (INDEPENDENT_AMBULATORY_CARE_PROVIDER_SITE_OTHER): Payer: 59 | Admitting: Infectious Diseases

## 2013-11-08 ENCOUNTER — Encounter: Payer: Self-pay | Admitting: Infectious Diseases

## 2013-11-08 VITALS — BP 170/93 | HR 81 | Temp 98.2°F | Ht 60.0 in | Wt 133.0 lb

## 2013-11-08 DIAGNOSIS — R21 Rash and other nonspecific skin eruption: Secondary | ICD-10-CM

## 2013-11-08 DIAGNOSIS — I10 Essential (primary) hypertension: Secondary | ICD-10-CM

## 2013-11-08 DIAGNOSIS — B20 Human immunodeficiency virus [HIV] disease: Secondary | ICD-10-CM

## 2013-11-08 DIAGNOSIS — Z853 Personal history of malignant neoplasm of breast: Secondary | ICD-10-CM

## 2013-11-08 NOTE — Assessment & Plan Note (Signed)
Had normal mammo last year

## 2013-11-08 NOTE — Assessment & Plan Note (Signed)
She will restart her rx today. Has been asx.

## 2013-11-08 NOTE — Progress Notes (Signed)
   Subjective:    Patient ID: Victoria Coffey, female    DOB: 05-27-1956, 58 y.o.   MRN: [id number]  Rash Pertinent negatives include no cough, diarrhea, fever, shortness of breath or sore throat.  58 yo F with hx of HIV+, HTN. Has been out of anit-htn (her pharmacy was out of rx). Has developed rash for last 7 days due to "too many ruby red grapefruits". No itching or burning. Just has "cucmber skin". On trunk and arms. Has tried zantac and benadryl with rash drying up somewhat.  Has been on atripla for many years. No new medications.  Had normal PAP September 2014  HIV 1 RNA Quant (copies/mL)  Date Value  09/13/2013 45*  06/06/2013 <20   12/06/2012 41*     CD4 T Cell Abs (/uL)  Date Value  09/13/2013 670   06/06/2013 590   12/06/2012 560       Review of Systems  Constitutional: Negative for fever and chills.  HENT: Negative for mouth sores and sore throat.   Respiratory: Negative for cough and shortness of breath.   Cardiovascular: Negative for chest pain.  Gastrointestinal: Negative for diarrhea and constipation.  Genitourinary: Negative for difficulty urinating.  Skin: Positive for rash.  Neurological: Negative for headaches.       Objective:   Physical Exam  Constitutional: She appears well-developed and well-nourished.  HENT:  Mouth/Throat: No oropharyngeal exudate.  Eyes: EOM are normal. Pupils are equal, round, and reactive to light.  Neck: Neck supple.  Cardiovascular: Normal rate, regular rhythm and normal heart sounds.   Pulmonary/Chest: Effort normal and breath sounds normal.  Abdominal: Soft. Bowel sounds are normal. She exhibits no distension. There is no tenderness.  Lymphadenopathy:    She has no cervical adenopathy.  Skin: Rash noted. Rash is papular.          Assessment & Plan:

## 2013-11-08 NOTE — Assessment & Plan Note (Signed)
I asked her to continue to take benadryl and pepcid for 1 week. I offered her a course of steroids, however she and I are both equivocal about this. She has no SOB, cough, dysphagia and feels her rash is improving. Given this, will defer steroids unless she has worsening. She will call if any change in sx. I have added "grapefruit" to her allergy list.

## 2013-11-08 NOTE — Assessment & Plan Note (Signed)
She is doing well. She is offered, refuses condoms. She will f/u with Dr Baxter Flattery as previously scheduled.

## 2014-03-20 ENCOUNTER — Other Ambulatory Visit (INDEPENDENT_AMBULATORY_CARE_PROVIDER_SITE_OTHER): Payer: 59

## 2014-03-20 DIAGNOSIS — B20 Human immunodeficiency virus [HIV] disease: Secondary | ICD-10-CM

## 2014-03-20 LAB — CBC WITH DIFFERENTIAL/PLATELET
BASOS PCT: 0 % (ref 0–1)
Basophils Absolute: 0 10*3/uL (ref 0.0–0.1)
EOS ABS: 0.1 10*3/uL (ref 0.0–0.7)
EOS PCT: 2 % (ref 0–5)
HEMATOCRIT: 36.7 % (ref 36.0–46.0)
Hemoglobin: 12.3 g/dL (ref 12.0–15.0)
LYMPHS ABS: 1.2 10*3/uL (ref 0.7–4.0)
Lymphocytes Relative: 45 % (ref 12–46)
MCH: 29.1 pg (ref 26.0–34.0)
MCHC: 33.5 g/dL (ref 30.0–36.0)
MCV: 86.8 fL (ref 78.0–100.0)
MONO ABS: 0.2 10*3/uL (ref 0.1–1.0)
Monocytes Relative: 8 % (ref 3–12)
Neutro Abs: 1.2 10*3/uL — ABNORMAL LOW (ref 1.7–7.7)
Neutrophils Relative %: 45 % (ref 43–77)
Platelets: 241 10*3/uL (ref 150–400)
RBC: 4.23 MIL/uL (ref 3.87–5.11)
RDW: 15.8 % — ABNORMAL HIGH (ref 11.5–15.5)
WBC: 2.7 10*3/uL — ABNORMAL LOW (ref 4.0–10.5)

## 2014-03-20 LAB — COMPLETE METABOLIC PANEL WITH GFR
ALK PHOS: 149 U/L — AB (ref 39–117)
ALT: 19 U/L (ref 0–35)
AST: 24 U/L (ref 0–37)
Albumin: 3.9 g/dL (ref 3.5–5.2)
BUN: 16 mg/dL (ref 6–23)
CO2: 28 mEq/L (ref 19–32)
CREATININE: 0.81 mg/dL (ref 0.50–1.10)
Calcium: 9.2 mg/dL (ref 8.4–10.5)
Chloride: 105 mEq/L (ref 96–112)
GFR, Est Non African American: 81 mL/min
GLUCOSE: 93 mg/dL (ref 70–99)
Potassium: 4.5 mEq/L (ref 3.5–5.3)
SODIUM: 138 meq/L (ref 135–145)
TOTAL PROTEIN: 6.9 g/dL (ref 6.0–8.3)
Total Bilirubin: 0.3 mg/dL (ref 0.2–1.2)

## 2014-03-21 LAB — HIV-1 RNA QUANT-NO REFLEX-BLD: HIV-1 RNA Quant, Log: <1.3 {Log} (ref <1.30)

## 2014-03-21 LAB — T-HELPER CELL (CD4) - (RCID CLINIC ONLY)
CD4 % Helper T Cell: 41 % (ref 33–55)
CD4 T CELL ABS: 570 /uL (ref 400–2700)

## 2014-04-03 ENCOUNTER — Ambulatory Visit (INDEPENDENT_AMBULATORY_CARE_PROVIDER_SITE_OTHER): Payer: 59 | Admitting: Internal Medicine

## 2014-04-03 ENCOUNTER — Encounter: Payer: Self-pay | Admitting: Internal Medicine

## 2014-04-03 VITALS — BP 142/88 | HR 84 | Temp 98.0°F | Wt 130.0 lb

## 2014-04-03 DIAGNOSIS — Z21 Asymptomatic human immunodeficiency virus [HIV] infection status: Secondary | ICD-10-CM

## 2014-04-03 DIAGNOSIS — B20 Human immunodeficiency virus [HIV] disease: Secondary | ICD-10-CM

## 2014-04-03 MED ORDER — EFAVIRENZ-EMTRICITAB-TENOFOVIR 600-200-300 MG PO TABS: 1.0000 | ORAL_TABLET | Freq: Every day | ORAL | Status: DC

## 2014-04-03 NOTE — Progress Notes (Signed)
   Subjective:    Patient ID: Victoria Coffey, female    DOB: [birth year], 58 y.o.   MRN: [id number]  HPI Victoria Coffey isa 58yo F with HIV, CD 4 count of 570/VL<20, on atripla, last seen six months ago. She is doing well. No complaints about her health. Did not get sick this past winter. Needs refill on ART  Current Outpatient Prescriptions on File Prior to Visit  Medication Sig Dispense Refill  . benazepril-hydrochlorthiazide (LOTENSIN HCT) 10-12.5 MG per tablet Take 1 tablet by mouth daily.  30 tablet  11  . efavirenz-emtricitabine-tenofovir ([name]) 600-200-300 MG per tablet Take 1 tablet by mouth daily.  30 tablet  11   No current facility-administered medications on file prior to visit.    Active Ambulatory Problems    Diagnosis Date Noted  . HIV DISEASE 04/14/2007  . HYPERTENSION, BENIGN ESSENTIAL 08/11/2007  . History of breast cancer 12/20/2012  . Rash and nonspecific skin eruption 11/08/2013   Resolved Ambulatory Problems    Diagnosis Date Noted  . No Resolved Ambulatory Problems   No Additional Past Medical History      Review of Systems Review of Systems  Constitutional: Negative for fever, chills, diaphoresis, activity change, appetite change, fatigue and unexpected weight change.  HENT: Negative for congestion, sore throat, rhinorrhea, sneezing, trouble swallowing and sinus pressure.  Eyes: Negative for photophobia and visual disturbance.  Respiratory: Negative for cough, chest tightness, shortness of breath, wheezing and stridor.  Cardiovascular: Negative for chest pain, palpitations and leg swelling.  Gastrointestinal: Negative for nausea, vomiting, abdominal pain, diarrhea, constipation, blood in stool, abdominal distention and anal bleeding.  Genitourinary: Negative for dysuria, hematuria, flank pain and difficulty urinating.  Musculoskeletal: Negative for myalgias, back pain, joint swelling, arthralgias and gait problem.  Skin: Negative for color change, pallor, rash  and wound.  Neurological: Negative for dizziness, tremors, weakness and light-headedness.  Hematological: Negative for adenopathy. Does not bruise/bleed easily.  Psychiatric/Behavioral: Negative for behavioral problems, confusion, sleep disturbance, dysphoric mood, decreased concentration and agitation.        Objective:   Physical Exam BP 142/88  Pulse 84  Temp(Src) 98 F (36.7 C) (Oral)  Wt 130 lb (58.968 kg)  Constitutional:  oriented to person, place, and time. appears well-developed and well-nourished. No distress.  HENT:  Mouth/Throat: Oropharynx is clear and moist. No oropharyngeal exudate.  Cardiovascular: Normal rate, regular rhythm and normal heart sounds. Exam reveals no gallop and no friction rub.  No murmur heard.  Soft. Bowel sounds are normal.  exhibits no distension. There is no tenderness.  Pulmonary/Chest: Effort normal and breath sounds normal. No respiratory distress.  has no wheezes.  Abdominal: Lymphadenopathy: no cervical adenopathy.  Neurological: alert and oriented to person, place, and time.  Skin: Skin is warm and dry. No rash noted. No erythema.  Psychiatric: a normal mood and affect.s behavior is normal.         Assessment & Plan:  hiv = well controlled. Will get refills and co pay card  Health maintenance = will need mammo in June and pap this summer, flu shot in the fall

## 2014-04-05 ENCOUNTER — Ambulatory Visit: Payer: 59 | Admitting: Internal Medicine

## 2014-08-28 ENCOUNTER — Other Ambulatory Visit (INDEPENDENT_AMBULATORY_CARE_PROVIDER_SITE_OTHER): Payer: 59

## 2014-08-28 DIAGNOSIS — B20 Human immunodeficiency virus [HIV] disease: Secondary | ICD-10-CM

## 2014-08-28 DIAGNOSIS — Z79899 Other long term (current) drug therapy: Secondary | ICD-10-CM

## 2014-08-28 DIAGNOSIS — Z113 Encounter for screening for infections with a predominantly sexual mode of transmission: Secondary | ICD-10-CM

## 2014-08-28 LAB — CBC WITH DIFFERENTIAL/PLATELET
BASOS PCT: 0 % (ref 0–1)
Basophils Absolute: 0 10*3/uL (ref 0.0–0.1)
EOS ABS: 0.1 10*3/uL (ref 0.0–0.7)
EOS PCT: 2 % (ref 0–5)
HCT: 38.2 % (ref 36.0–46.0)
Hemoglobin: 12.5 g/dL (ref 12.0–15.0)
Lymphocytes Relative: 46 % (ref 12–46)
Lymphs Abs: 1.2 10*3/uL (ref 0.7–4.0)
MCH: 28.6 pg (ref 26.0–34.0)
MCHC: 32.7 g/dL (ref 30.0–36.0)
MCV: 87.4 fL (ref 78.0–100.0)
Monocytes Absolute: 0.2 10*3/uL (ref 0.1–1.0)
Monocytes Relative: 7 % (ref 3–12)
NEUTROS PCT: 45 % (ref 43–77)
Neutro Abs: 1.2 10*3/uL — ABNORMAL LOW (ref 1.7–7.7)
PLATELETS: 262 10*3/uL (ref 150–400)
RBC: 4.37 MIL/uL (ref 3.87–5.11)
RDW: 16 % — AB (ref 11.5–15.5)
WBC: 2.7 10*3/uL — ABNORMAL LOW (ref 4.0–10.5)

## 2014-08-28 LAB — COMPLETE METABOLIC PANEL WITH GFR
ALK PHOS: 149 U/L — AB (ref 39–117)
ALT: 22 U/L (ref 0–35)
AST: 26 U/L (ref 0–37)
Albumin: 4.2 g/dL (ref 3.5–5.2)
BILIRUBIN TOTAL: 0.3 mg/dL (ref 0.2–1.2)
BUN: 15 mg/dL (ref 6–23)
CO2: 27 mEq/L (ref 19–32)
Calcium: 9.4 mg/dL (ref 8.4–10.5)
Chloride: 107 mEq/L (ref 96–112)
Creat: 0.84 mg/dL (ref 0.50–1.10)
GFR, Est African American: 89 mL/min
GFR, Est Non African American: 77 mL/min
Glucose, Bld: 92 mg/dL (ref 70–99)
Potassium: 5.1 mEq/L (ref 3.5–5.3)
Sodium: 141 mEq/L (ref 135–145)
Total Protein: 7.1 g/dL (ref 6.0–8.3)

## 2014-08-28 LAB — LIPID PANEL
CHOL/HDL RATIO: 2.3 ratio
Cholesterol: 201 mg/dL — ABNORMAL HIGH (ref 0–200)
HDL: 88 mg/dL (ref >39)
LDL CALC: 104 mg/dL — AB (ref 0–99)
TRIGLYCERIDES: 45 mg/dL (ref <150)
VLDL: 9 mg/dL (ref 0–40)

## 2014-08-28 LAB — RPR

## 2014-08-29 LAB — T-HELPER CELL (CD4) - (RCID CLINIC ONLY)
CD4 % Helper T Cell: 39 % (ref 33–55)
CD4 T Cell Abs: 590 /uL (ref 400–2700)

## 2014-08-29 LAB — HIV-1 RNA QUANT-NO REFLEX-BLD: HIV-1 RNA Quant, Log: <1.3 {Log} (ref <1.30)

## 2014-09-13 ENCOUNTER — Ambulatory Visit (INDEPENDENT_AMBULATORY_CARE_PROVIDER_SITE_OTHER): Payer: 59 | Admitting: Internal Medicine

## 2014-09-13 ENCOUNTER — Encounter: Payer: Self-pay | Admitting: Internal Medicine

## 2014-09-13 ENCOUNTER — Ambulatory Visit (INDEPENDENT_AMBULATORY_CARE_PROVIDER_SITE_OTHER): Payer: 59 | Admitting: *Deleted

## 2014-09-13 VITALS — BP 148/80 | HR 81 | Temp 97.7°F | Wt 130.0 lb

## 2014-09-13 DIAGNOSIS — Z23 Encounter for immunization: Secondary | ICD-10-CM

## 2014-09-13 DIAGNOSIS — I10 Essential (primary) hypertension: Secondary | ICD-10-CM

## 2014-09-13 DIAGNOSIS — Z21 Asymptomatic human immunodeficiency virus [HIV] infection status: Secondary | ICD-10-CM

## 2014-09-13 DIAGNOSIS — B20 Human immunodeficiency virus [HIV] disease: Secondary | ICD-10-CM

## 2014-09-13 MED ORDER — EFAVIRENZ-EMTRICITAB-TENOFOVIR 600-200-300 MG PO TABS: 1.0000 | ORAL_TABLET | Freq: Every day | ORAL | Status: DC

## 2014-09-13 MED ORDER — HYDROCHLOROTHIAZIDE 12.5 MG PO CAPS
12.5000 mg | ORAL_CAPSULE | Freq: Every day | ORAL | Status: DC
Start: 2014-09-13 — End: 2015-10-03

## 2014-09-13 NOTE — Progress Notes (Signed)
Patient ID: Victoria Coffey, female   DOB: Apr 14, 1956, 58 y.o.   MRN: [id number]       Patient ID: Victoria Coffey, female   DOB: 12-Nov-1955, 58 y.o.   MRN: [id number]  HPI Victoria Coffey is a 58yo F with hiv disease and HTN, she has well controlled her HIV disease, CD 4 count of 590/VL<20 on atripla. Stopped taking bp meds since when she checks her BP at [facility], it appears that it is normal range without meds. She also checks her bp at home and is usually 120/70s she states. She has started to increase her exercise as well as including yoga. In good health, denies any recent health problems  She recently had mammogram which she reports as normal.  She has upcoming pap next week  Outpatient Encounter Prescriptions as of 09/13/2014  Medication Sig  . benazepril-hydrochlorthiazide (LOTENSIN HCT) 10-12.5 MG per tablet Take 1 tablet by mouth daily.  Marland Kitchen efavirenz-emtricitabine-tenofovir ([name]) 600-200-300 MG per tablet Take 1 tablet by mouth daily.     Patient Active Problem List   Diagnosis Date Noted  . Rash and nonspecific skin eruption 11/08/2013  . History of breast cancer 12/20/2012  . HYPERTENSION, BENIGN ESSENTIAL 08/11/2007  . HIV DISEASE 04/14/2007     Health Maintenance Due  Topic Date Due  . TETANUS/TDAP  07/22/1975  . MAMMOGRAM  07/21/2006  . COLONOSCOPY  07/21/2006  . PAP SMEAR  11/14/2011  . INFLUENZA VACCINE  06/02/2014     Review of Systems Review of Systems  Constitutional: Negative for fever, chills, diaphoresis, activity change, appetite change, fatigue and unexpected weight change.  HENT: Negative for congestion, sore throat, rhinorrhea, sneezing, trouble swallowing and sinus pressure.  Eyes: Negative for photophobia and visual disturbance.  Respiratory: Negative for cough, chest tightness, shortness of breath, wheezing and stridor.  Cardiovascular: Negative for chest pain, palpitations and leg swelling.  Gastrointestinal: Negative for nausea, vomiting, abdominal pain,  diarrhea, constipation, blood in stool, abdominal distention and anal bleeding.  Genitourinary: Negative for dysuria, hematuria, flank pain and difficulty urinating.  Musculoskeletal: Negative for myalgias, back pain, joint swelling, arthralgias and gait problem.  Skin: Negative for color change, pallor, rash and wound.  Neurological: Negative for dizziness, tremors, weakness and light-headedness.  Hematological: Negative for adenopathy. Does not bruise/bleed easily.  Psychiatric/Behavioral: Negative for behavioral problems, confusion, sleep disturbance, dysphoric mood, decreased concentration and agitation.    Physical Exam  BP 159/94 mmHg  Pulse 81  Temp(Src) 97.7 F (36.5 C) (Oral)  Wt 130 lb (58.968 kg)  Physical Exam  Constitutional:  oriented to person, place, and time. appears well-developed and well-nourished. No distress.  HENT:  Mouth/Throat: Oropharynx is clear and moist. No oropharyngeal exudate.  Cardiovascular: Normal rate, regular rhythm and normal heart sounds. Exam reveals no gallop and no friction rub.  No murmur heard.  Pulmonary/Chest: Effort normal and breath sounds normal. No respiratory distress.  has no wheezes.  Abdominal: Soft. Bowel sounds are normal.  exhibits no distension. There is no tenderness.  Lymphadenopathy: no cervical adenopathy.  Neurological: alert and oriented to person, place, and time.  Skin: Skin is warm and dry. No rash noted. No erythema.  Psychiatric: a normal mood and affect.  behavior is normal.   Lab Results  Component Value Date   CD4TCELL 39 08/28/2014   Lab Results  Component Value Date   CD4TABS 590 08/28/2014   CD4TABS 570 03/20/2014   CD4TABS 670 09/13/2013   Lab Results  Component Value Date   HIV1RNAQUANT <  20 08/28/2014   Lab Results  Component Value Date   HEPBSAB NO 12/27/2006   No results found for: RPR  CBC Lab Results  Component Value Date   WBC 2.7* 08/28/2014   RBC 4.37 08/28/2014   HGB 12.5  08/28/2014   HCT 38.2 08/28/2014   PLT 262 08/28/2014   MCV 87.4 08/28/2014   MCH 28.6 08/28/2014   MCHC 32.7 08/28/2014   RDW 16.0* 08/28/2014   LYMPHSABS 1.2 08/28/2014   MONOABS 0.2 08/28/2014   EOSABS 0.1 08/28/2014   BASOSABS 0.0 08/28/2014   BMET Lab Results  Component Value Date   NA 141 08/28/2014   K 5.1 08/28/2014   CL 107 08/28/2014   CO2 27 08/28/2014   GLUCOSE 92 08/28/2014   BUN 15 08/28/2014   CREATININE 0.84 08/28/2014   CALCIUM 9.4 08/28/2014   GFRNONAA 77 08/28/2014   GFRAA 89 08/28/2014     Assessment and Plan   hiv =  Well controlled, continue with atripla  htn = rechecked at end of visit, and it was 148/40, and reports that she is not taking her medications since at [facility] it has been controlled for the past 2 months. We will start her on hct 12.5mg  daily. Have her come back in next 1-2 wk for bp check.  Health maintenance = will need flu shot today. She has had her mammo for the year as well as getting pap next week.  rtc in 6 months

## 2015-08-27 ENCOUNTER — Other Ambulatory Visit: Payer: Self-pay

## 2015-08-27 DIAGNOSIS — Z113 Encounter for screening for infections with a predominantly sexual mode of transmission: Secondary | ICD-10-CM

## 2015-08-27 DIAGNOSIS — Z79899 Other long term (current) drug therapy: Secondary | ICD-10-CM

## 2015-08-27 DIAGNOSIS — B20 Human immunodeficiency virus [HIV] disease: Secondary | ICD-10-CM

## 2015-08-27 LAB — CBC WITH DIFFERENTIAL/PLATELET
BASOS ABS: 0 10*3/uL (ref 0.0–0.1)
Basophils Relative: 0 % (ref 0–1)
Eosinophils Absolute: 0 10*3/uL (ref 0.0–0.7)
Eosinophils Relative: 1 % (ref 0–5)
HCT: 37.5 % (ref 36.0–46.0)
HEMOGLOBIN: 12.1 g/dL (ref 12.0–15.0)
LYMPHS ABS: 1.4 10*3/uL (ref 0.7–4.0)
LYMPHS PCT: 38 % (ref 12–46)
MCH: 28.5 pg (ref 26.0–34.0)
MCHC: 32.3 g/dL (ref 30.0–36.0)
MCV: 88.2 fL (ref 78.0–100.0)
MONOS PCT: 5 % (ref 3–12)
MPV: 10.5 fL (ref 8.6–12.4)
Monocytes Absolute: 0.2 10*3/uL (ref 0.1–1.0)
NEUTROS ABS: 2.1 10*3/uL (ref 1.7–7.7)
NEUTROS PCT: 56 % (ref 43–77)
PLATELETS: 241 10*3/uL (ref 150–400)
RBC: 4.25 MIL/uL (ref 3.87–5.11)
RDW: 15.3 % (ref 11.5–15.5)
WBC: 3.8 10*3/uL — ABNORMAL LOW (ref 4.0–10.5)

## 2015-08-27 LAB — COMPLETE METABOLIC PANEL WITH GFR
ALBUMIN: 3.9 g/dL (ref 3.6–5.1)
ALK PHOS: 151 U/L — AB (ref 33–130)
ALT: 21 U/L (ref 6–29)
AST: 28 U/L (ref 10–35)
BILIRUBIN TOTAL: 0.3 mg/dL (ref 0.2–1.2)
BUN: 21 mg/dL (ref 7–25)
CO2: 24 mmol/L (ref 20–31)
CREATININE: 0.83 mg/dL (ref 0.50–1.05)
Calcium: 9.1 mg/dL (ref 8.6–10.4)
Chloride: 107 mmol/L (ref 98–110)
GFR, EST AFRICAN AMERICAN: 89 mL/min (ref >=60)
GFR, EST NON AFRICAN AMERICAN: 77 mL/min (ref >=60)
GLUCOSE: 96 mg/dL (ref 65–99)
Potassium: 4.2 mmol/L (ref 3.5–5.3)
Sodium: 142 mmol/L (ref 135–146)
TOTAL PROTEIN: 6.8 g/dL (ref 6.1–8.1)

## 2015-08-27 LAB — LIPID PANEL
CHOL/HDL RATIO: 2 ratio (ref <=5.0)
CHOLESTEROL: 208 mg/dL — AB (ref 125–200)
HDL: 103 mg/dL (ref >=46)
LDL Cholesterol: 96 mg/dL (ref <130)
Triglycerides: 44 mg/dL (ref <150)
VLDL: 9 mg/dL (ref <30)

## 2015-08-28 LAB — HIV-1 RNA QUANT-NO REFLEX-BLD: HIV-1 RNA Quant, Log: <1.3 Log copies/mL (ref <1.30)

## 2015-08-28 LAB — RPR

## 2015-08-29 LAB — T-HELPER CELL (CD4) - (RCID CLINIC ONLY)
CD4 % Helper T Cell: 40 % (ref 33–55)
CD4 T Cell Abs: 590 /uL (ref 400–2700)

## 2015-09-04 LAB — HLA B*5701: HLA-B 5701 W/RFLX HLA-B HIGH: NEGATIVE

## 2015-09-10 ENCOUNTER — Encounter: Payer: Self-pay | Admitting: Internal Medicine

## 2015-09-10 ENCOUNTER — Ambulatory Visit (INDEPENDENT_AMBULATORY_CARE_PROVIDER_SITE_OTHER): Payer: 59 | Admitting: Internal Medicine

## 2015-09-10 VITALS — BP 139/88 | HR 69 | Temp 97.3°F | Wt 133.0 lb

## 2015-09-10 DIAGNOSIS — Z23 Encounter for immunization: Secondary | ICD-10-CM

## 2015-09-10 DIAGNOSIS — B20 Human immunodeficiency virus [HIV] disease: Secondary | ICD-10-CM

## 2015-09-10 MED ORDER — ELVITEG-COBIC-EMTRICIT-TENOFAF 150-150-200-10 MG PO TABS: 1.0000 | ORAL_TABLET | Freq: Every day | ORAL | Status: DC

## 2015-09-10 NOTE — Progress Notes (Signed)
Patient ID: Victoria Coffey, female   DOB: 02-25-1956, 59 y.o.   MRN: [id number]       Patient ID: Victoria Coffey, female   DOB: [birth year], 59 y.o.   MRN: [id number]  HPI  Verlisa is a 59yo F with HIV disease, CD 4 count 590/VL<20, on atripla. Last seen on her annual exam last Fall. She states that she has been in good health. She has not had any recent illnesses. Does not have any difficulty with medications. No insomnia relating to her atripla. No hx of depression. She brings with her copies of her paperwork from PAP and mammo. Outpatient Encounter Prescriptions as of 09/10/2015  Medication Sig  . efavirenz-emtricitabine-tenofovir ([name]) 600-200-300 MG per tablet Take 1 tablet by mouth daily.  . hydrochlorothiazide (MICROZIDE) 12.5 MG capsule Take 1 capsule (12.5 mg total) by mouth daily.   No facility-administered encounter medications on file as of 09/10/2015.     Patient Active Problem List   Diagnosis Date Noted  . Rash and nonspecific skin eruption 11/08/2013  . History of breast cancer 12/20/2012  . HYPERTENSION, BENIGN ESSENTIAL 08/11/2007  . HIV DISEASE 04/14/2007     Health Maintenance Due  Topic Date Due  . TETANUS/TDAP  07/22/1975  . MAMMOGRAM  07/21/2006  . COLONOSCOPY  07/21/2006  . PAP SMEAR  11/14/2011  . INFLUENZA VACCINE  06/03/2015     Review of Systems Review of Systems  Constitutional: Negative for fever, chills, diaphoresis, activity change, appetite change, fatigue and unexpected weight change.  HENT: Negative for congestion, sore throat, rhinorrhea, sneezing, trouble swallowing and sinus pressure.  Eyes: Negative for photophobia and visual disturbance.  Respiratory: Negative for cough, chest tightness, shortness of breath, wheezing and stridor.  Cardiovascular: Negative for chest pain, palpitations and leg swelling.  Gastrointestinal: Negative for nausea, vomiting, abdominal pain, diarrhea, constipation, blood in stool, abdominal distention and anal  bleeding.  Genitourinary: Negative for dysuria, hematuria, flank pain and difficulty urinating.  Musculoskeletal: Negative for myalgias, back pain, joint swelling, arthralgias and gait problem.  Skin: Negative for color change, pallor, rash and wound.  Neurological: Negative for dizziness, tremors, weakness and light-headedness.  Hematological: Negative for adenopathy. Does not bruise/bleed easily.  Psychiatric/Behavioral: Negative for behavioral problems, confusion, sleep disturbance, dysphoric mood, decreased concentration and agitation.    Physical Exam   BP 139/88 mmHg  Pulse 69  Temp(Src) 97.3 F (36.3 C) (Oral)  Wt 133 lb (60.328 kg) Physical Exam  Constitutional:  oriented to person, place, and time. appears well-developed and well-nourished. No distress.  HENT: [place]/AT, PERRLA, no scleral icterus Mouth/Throat: Oropharynx is clear and moist. No oropharyngeal exudate.  Cardiovascular: Normal rate, regular rhythm and normal heart sounds. Exam reveals no gallop and no friction rub.  No murmur heard.  Pulmonary/Chest: Effort normal and breath sounds normal. No respiratory distress.  has no wheezes.  Neck = supple, no JVD Lymphadenopathy: no cervical adenopathy. No axillary adenopathy Neurological: alert and oriented to person, place, and time.  Skin: Skin is warm and dry. No rash noted. No erythema.  Psychiatric: a normal mood and affect.  behavior is normal.   Lab Results  Component Value Date   CD4TCELL 40 08/27/2015   Lab Results  Component Value Date   CD4TABS 590 08/27/2015   CD4TABS 590 08/28/2014   CD4TABS 570 03/20/2014   Lab Results  Component Value Date   HIV1RNAQUANT <20 08/27/2015   Lab Results  Component Value Date   HEPBSAB NO 12/27/2006   No results found  for: RPR  CBC Lab Results  Component Value Date   WBC 3.8* 08/27/2015   RBC 4.25 08/27/2015   HGB 12.1 08/27/2015   HCT 37.5 08/27/2015   PLT 241 08/27/2015   MCV 88.2 08/27/2015   MCH 28.5  08/27/2015   MCHC 32.3 08/27/2015   RDW 15.3 08/27/2015   LYMPHSABS 1.4 08/27/2015   MONOABS 0.2 08/27/2015   EOSABS 0.0 08/27/2015   BASOSABS 0.0 08/27/2015   BMET Lab Results  Component Value Date   NA 142 08/27/2015   K 4.2 08/27/2015   CL 107 08/27/2015   CO2 24 08/27/2015   GLUCOSE 96 08/27/2015   BUN 21 08/27/2015   CREATININE 0.83 08/27/2015   CALCIUM 9.1 08/27/2015   GFRNONAA 77 08/27/2015   GFRAA 89 08/27/2015   Lipid Panel     Component Value Date/Time   CHOL 208* 08/27/2015 0939   TRIG 44 08/27/2015 0939   HDL 103 08/27/2015 0939   CHOLHDL 2.0 08/27/2015 0939   VLDL 9 08/27/2015 0939   LDLCALC 96 08/27/2015 0939    Women's health = pap normal this Fall by Dr. Pamala Hurry. Also had mammo last week which was normal  Health maintenance = had flu vaccine this am  Assessment and Plan  hiv disease = well controlled, will switch her to TAF based regimen, switch to genvoya. We will have check repeat labs in jan/feb  Health maintenance = up to date

## 2015-09-17 ENCOUNTER — Other Ambulatory Visit: Payer: Self-pay | Admitting: *Deleted

## 2015-09-17 DIAGNOSIS — B20 Human immunodeficiency virus [HIV] disease: Secondary | ICD-10-CM

## 2015-09-17 MED ORDER — ELVITEG-COBIC-EMTRICIT-TENOFAF 150-150-200-10 MG PO TABS: 1.0000 | ORAL_TABLET | Freq: Every day | ORAL | Status: DC

## 2015-10-03 ENCOUNTER — Other Ambulatory Visit: Payer: Self-pay | Admitting: Internal Medicine

## 2015-10-03 DIAGNOSIS — I10 Essential (primary) hypertension: Secondary | ICD-10-CM

## 2015-10-07 ENCOUNTER — Telehealth: Payer: Self-pay | Admitting: *Deleted

## 2015-10-07 DIAGNOSIS — B20 Human immunodeficiency virus [HIV] disease: Secondary | ICD-10-CM

## 2015-10-07 MED ORDER — ELVITEG-COBIC-EMTRICIT-TENOFAF 150-150-200-10 MG PO TABS: 1.0000 | ORAL_TABLET | Freq: Every day | ORAL | Status: DC

## 2015-10-07 MED ORDER — EFAVIRENZ-EMTRICITAB-TENOFOVIR 600-200-300 MG PO TABS: 1.0000 | ORAL_TABLET | Freq: Every day | ORAL | Status: DC

## 2015-10-07 NOTE — Telephone Encounter (Addendum)
PA approved through [facility][id number], 10/06/2017.   Pt notified of approved.  She will call for a refill for the medication throug [facility].

## 2015-10-07 NOTE — Addendum Note (Signed)
Addended by: Lorne Skeens D on: 10/07/2015 11:30 AM   Modules accepted: Orders, Medications

## 2015-10-07 NOTE — Telephone Encounter (Signed)
PA for [facility] - completed and faxed back to [facility].

## 2015-11-05 ENCOUNTER — Other Ambulatory Visit: Payer: Self-pay | Admitting: *Deleted

## 2015-11-05 ENCOUNTER — Telehealth: Payer: Self-pay | Admitting: *Deleted

## 2015-11-05 DIAGNOSIS — B20 Human immunodeficiency virus [HIV] disease: Secondary | ICD-10-CM

## 2015-11-05 MED ORDER — EFAVIRENZ-EMTRICITAB-TENOFOVIR 600-200-300 MG PO TABS: 1.0000 | ORAL_TABLET | Freq: Every day | ORAL | Status: DC

## 2015-11-05 NOTE — Telephone Encounter (Signed)
Sent rx, notified patient

## 2015-11-05 NOTE — Telephone Encounter (Signed)
Patient feels the [name] is causing her to gain weight, would like to switch back to [name].  Please advise. Landis Gandy, RN

## 2015-11-05 NOTE — Telephone Encounter (Signed)
Switch back to atripla would be fine

## 2015-12-03 ENCOUNTER — Other Ambulatory Visit: Payer: 59

## 2015-12-03 DIAGNOSIS — B20 Human immunodeficiency virus [HIV] disease: Secondary | ICD-10-CM

## 2015-12-03 LAB — COMPLETE METABOLIC PANEL WITH GFR
ALBUMIN: 3.6 g/dL (ref 3.6–5.1)
ALT: 21 U/L (ref 6–29)
AST: 25 U/L (ref 10–35)
Alkaline Phosphatase: 133 U/L — ABNORMAL HIGH (ref 33–130)
BUN: 11 mg/dL (ref 7–25)
CALCIUM: 9.1 mg/dL (ref 8.6–10.4)
CHLORIDE: 105 mmol/L (ref 98–110)
CO2: 27 mmol/L (ref 20–31)
CREATININE: 0.86 mg/dL (ref 0.50–1.05)
GFR, Est African American: 86 mL/min (ref >=60)
GFR, Est Non African American: 74 mL/min (ref >=60)
GLUCOSE: 97 mg/dL (ref 65–99)
POTASSIUM: 4.3 mmol/L (ref 3.5–5.3)
SODIUM: 140 mmol/L (ref 135–146)
Total Bilirubin: 0.3 mg/dL (ref 0.2–1.2)
Total Protein: 6.9 g/dL (ref 6.1–8.1)

## 2015-12-03 LAB — CBC WITH DIFFERENTIAL/PLATELET
Basophils Absolute: 0 10*3/uL (ref 0.0–0.1)
Basophils Relative: 0 % (ref 0–1)
EOS PCT: 1 % (ref 0–5)
Eosinophils Absolute: 0 10*3/uL (ref 0.0–0.7)
HEMATOCRIT: 38.6 % (ref 36.0–46.0)
HEMOGLOBIN: 12.5 g/dL (ref 12.0–15.0)
LYMPHS PCT: 36 % (ref 12–46)
Lymphs Abs: 1.3 10*3/uL (ref 0.7–4.0)
MCH: 28.6 pg (ref 26.0–34.0)
MCHC: 32.4 g/dL (ref 30.0–36.0)
MCV: 88.3 fL (ref 78.0–100.0)
MONO ABS: 0.2 10*3/uL (ref 0.1–1.0)
MONOS PCT: 5 % (ref 3–12)
MPV: 10.2 fL (ref 8.6–12.4)
NEUTROS ABS: 2.1 10*3/uL (ref 1.7–7.7)
Neutrophils Relative %: 58 % (ref 43–77)
Platelets: 245 10*3/uL (ref 150–400)
RBC: 4.37 MIL/uL (ref 3.87–5.11)
RDW: 16.1 % — AB (ref 11.5–15.5)
WBC: 3.7 10*3/uL — AB (ref 4.0–10.5)

## 2015-12-04 LAB — T-HELPER CELL (CD4) - (RCID CLINIC ONLY)
CD4 T CELL HELPER: 38 % (ref 33–55)
CD4 T Cell Abs: 500 /uL (ref 400–2700)

## 2015-12-04 LAB — HIV-1 RNA QUANT-NO REFLEX-BLD
HIV 1 RNA QUANT: 45 {copies}/mL — AB (ref <20)
HIV-1 RNA Quant, Log: 1.65 Log copies/mL — ABNORMAL HIGH (ref <1.30)

## 2016-03-03 ENCOUNTER — Encounter: Payer: Self-pay | Admitting: Internal Medicine

## 2016-03-03 ENCOUNTER — Ambulatory Visit (INDEPENDENT_AMBULATORY_CARE_PROVIDER_SITE_OTHER): Payer: 59 | Admitting: Internal Medicine

## 2016-03-03 VITALS — BP 155/88 | HR 72 | Temp 98.1°F | Ht 62.0 in | Wt 134.0 lb

## 2016-03-03 DIAGNOSIS — I10 Essential (primary) hypertension: Secondary | ICD-10-CM | POA: Diagnosis not present

## 2016-03-03 DIAGNOSIS — B20 Human immunodeficiency virus [HIV] disease: Secondary | ICD-10-CM | POA: Diagnosis not present

## 2016-03-03 LAB — COMPLETE METABOLIC PANEL WITH GFR
ALBUMIN: 3.9 g/dL (ref 3.6–5.1)
ALK PHOS: 142 U/L — AB (ref 33–130)
ALT: 26 U/L (ref 6–29)
AST: 37 U/L — ABNORMAL HIGH (ref 10–35)
BILIRUBIN TOTAL: 0.2 mg/dL (ref 0.2–1.2)
BUN: 16 mg/dL (ref 7–25)
CO2: 27 mmol/L (ref 20–31)
Calcium: 9.1 mg/dL (ref 8.6–10.4)
Chloride: 107 mmol/L (ref 98–110)
Creat: 0.88 mg/dL (ref 0.50–1.05)
GFR, EST AFRICAN AMERICAN: 83 mL/min (ref >=60)
GFR, EST NON AFRICAN AMERICAN: 72 mL/min (ref >=60)
Glucose, Bld: 107 mg/dL — ABNORMAL HIGH (ref 65–99)
POTASSIUM: 4.4 mmol/L (ref 3.5–5.3)
Sodium: 142 mmol/L (ref 135–146)
TOTAL PROTEIN: 6.5 g/dL (ref 6.1–8.1)

## 2016-03-03 LAB — CBC WITH DIFFERENTIAL/PLATELET
BASOS ABS: 0 {cells}/uL (ref 0–200)
Basophils Relative: 0 %
EOS ABS: 36 {cells}/uL (ref 15–500)
Eosinophils Relative: 1 %
HCT: 38.5 % (ref 35.0–45.0)
Hemoglobin: 12.4 g/dL (ref 11.7–15.5)
Lymphocytes Relative: 35 %
Lymphs Abs: 1260 cells/uL (ref 850–3900)
MCH: 28.4 pg (ref 27.0–33.0)
MCHC: 32.2 g/dL (ref 32.0–36.0)
MCV: 88.3 fL (ref 80.0–100.0)
MONOS PCT: 4 %
MPV: 10.4 fL (ref 7.5–12.5)
Monocytes Absolute: 144 cells/uL — ABNORMAL LOW (ref 200–950)
NEUTROS ABS: 2160 {cells}/uL (ref 1500–7800)
NEUTROS PCT: 60 %
PLATELETS: 239 10*3/uL (ref 140–400)
RBC: 4.36 MIL/uL (ref 3.80–5.10)
RDW: 16.1 % — ABNORMAL HIGH (ref 11.0–15.0)
WBC: 3.6 10*3/uL — ABNORMAL LOW (ref 3.8–10.8)

## 2016-03-03 MED ORDER — EFAVIRENZ-EMTRICITAB-TENOFOVIR 600-200-300 MG PO TABS: 1.0000 | ORAL_TABLET | Freq: Every day | ORAL | Status: DC

## 2016-03-03 NOTE — Progress Notes (Signed)
Patient ID: Victoria Coffey, female   DOB: Mar 16, 1956, 60 y.o.   MRN: [id number]     RFV: 6 month visit for hiv management  Patient ID: Victoria Coffey, female   DOB: 08-Jul-1956, 60 y.o.   MRN: [id number]  HPI  60yo F with Cd 4 count of 500/VL 45. On atripla. Previously tried to switch to TAF based regimen, genvoya, but she did not tolerate new regimen, thus switched back to [place] at end of the January. She states that within a week of taking genvoya, she noted to have abdominal bloating, water retention, weight gain. Since switching back to [place]. She feels back to her baseline.   Outpatient Encounter Prescriptions as of 03/03/2016  Medication Sig  . efavirenz-emtricitabine-tenofovir ([name]) 600-200-300 MG tablet Take 1 tablet by mouth at bedtime.  . hydrochlorothiazide (MICROZIDE) 12.5 MG capsule TAKE 1 CAPSULE (12.5 MG TOTAL) BY MOUTH DAILY.   No facility-administered encounter medications on file as of 03/03/2016.     Patient Active Problem List   Diagnosis Date Noted  . Rash and nonspecific skin eruption 11/08/2013  . History of breast cancer 12/20/2012  . HYPERTENSION, BENIGN ESSENTIAL 08/11/2007  . HIV DISEASE 04/14/2007     Health Maintenance Due  Topic Date Due  . TETANUS/TDAP  07/22/1975  . MAMMOGRAM  07/21/2006  . COLONOSCOPY  07/21/2006  . PAP SMEAR  11/14/2011     Review of Systems  Constitutional: Negative for fever, chills, diaphoresis, activity change, appetite change, fatigue and + weight change.  HENT: Negative for congestion, sore throat, rhinorrhea, sneezing, trouble swallowing and sinus pressure.  Eyes: Negative for photophobia and visual disturbance.  Respiratory: Negative for cough, chest tightness, shortness of breath, wheezing and stridor.  Cardiovascular: Negative for chest pain, palpitations and leg swelling.  Gastrointestinal: Negative for nausea, vomiting, abdominal pain, diarrhea, constipation, blood in stool, abdominal distention and anal bleeding.    Genitourinary: Negative for dysuria, hematuria, flank pain and difficulty urinating.  Musculoskeletal: Negative for myalgias, back pain, joint swelling, arthralgias and gait problem.  Skin: Negative for color change, pallor, rash and wound.  Neurological: Negative for dizziness, tremors, weakness and light-headedness.  Hematological: Negative for adenopathy. Does not bruise/bleed easily.  Psychiatric/Behavioral: Negative for behavioral problems, confusion, sleep disturbance, dysphoric mood, decreased concentration and agitation.    Physical Exam   BP 155/88 mmHg  Pulse 72  Temp(Src) 98.1 F (36.7 C) (Oral)  Ht 5\' 2"  (1.575 m)  Wt 134 lb (60.782 kg)  BMI 24.50 kg/m2 Physical Exam  Constitutional:  oriented to person, place, and time. appears well-developed and well-nourished. No distress.  HENT: [place]/AT, PERRLA, no scleral icterus Mouth/Throat: Oropharynx is clear and moist. No oropharyngeal exudate.  Cardiovascular: Normal rate, regular rhythm and normal heart sounds. Exam reveals no gallop and no friction rub.  No murmur heard.  Pulmonary/Chest: Effort normal and breath sounds normal. No respiratory distress.  has no wheezes.  Neck = supple, no nuchal rigidity Abdominal: Soft. Bowel sounds are normal.  exhibits no distension. There is no tenderness.  Lymphadenopathy: no cervical adenopathy. No axillary adenopathy Neurological: alert and oriented to person, place, and time.  Skin: Skin is warm and dry. No rash noted. No erythema.  Psychiatric: a normal mood and affect.  behavior is normal.   Lab Results  Component Value Date   CD4TCELL 38 12/03/2015   Lab Results  Component Value Date   CD4TABS 500 12/03/2015   CD4TABS 590 08/27/2015   CD4TABS 590 08/28/2014   Lab Results  Component Value Date   HIV1RNAQUANT 45* 12/03/2015   Lab Results  Component Value Date   HEPBSAB NO 12/27/2006   No results found for: RPR  CBC Lab Results  Component Value Date   WBC 3.7*  12/03/2015   RBC 4.37 12/03/2015   HGB 12.5 12/03/2015   HCT 38.6 12/03/2015   PLT 245 12/03/2015   MCV 88.3 12/03/2015   MCH 28.6 12/03/2015   MCHC 32.4 12/03/2015   RDW 16.1* 12/03/2015   LYMPHSABS 1.3 12/03/2015   MONOABS 0.2 12/03/2015   EOSABS 0.0 12/03/2015   BASOSABS 0.0 12/03/2015   BMET Lab Results  Component Value Date   NA 140 12/03/2015   K 4.3 12/03/2015   CL 105 12/03/2015   CO2 27 12/03/2015   GLUCOSE 97 12/03/2015   BUN 11 12/03/2015   CREATININE 0.86 12/03/2015   CALCIUM 9.1 12/03/2015   GFRNONAA 74 12/03/2015   GFRAA 86 12/03/2015     Assessment and Plan  hiv disease = unsure why she did not tolerate genvoya, possibly integrase inhibitor is also new or cobi aspect of the regimen. Since she is now back to her baseline with atripla. We will continue to monitor her viral laod as well as cr function and bone health  Health maitenance = flu vaccine in teh fall. Check status of mammo and pap at next visit  htn = above goal at this visit, but she states that she did not take her htn meds yet. Will not adjust at this time. Continue to monitor at next visit

## 2016-03-04 LAB — T-HELPER CELL (CD4) - (RCID CLINIC ONLY)
CD4 % Helper T Cell: 39 % (ref 33–55)
CD4 T Cell Abs: 510 /uL (ref 400–2700)

## 2016-03-04 LAB — HIV-1 RNA QUANT-NO REFLEX-BLD
HIV 1 RNA Quant: 114 copies/mL — ABNORMAL HIGH (ref <20)
HIV-1 RNA Quant, Log: 2.06 Log copies/mL — ABNORMAL HIGH (ref <1.30)

## 2016-03-06 LAB — VITAMIN D 1,25 DIHYDROXY
VITAMIN D3 1, 25 (OH): 73 pg/mL
Vitamin D 1, 25 (OH)2 Total: 73 pg/mL — ABNORMAL HIGH (ref 18–72)

## 2016-12-29 ENCOUNTER — Other Ambulatory Visit: Payer: Self-pay | Admitting: Internal Medicine

## 2016-12-29 ENCOUNTER — Other Ambulatory Visit: Payer: 59

## 2016-12-29 DIAGNOSIS — B20 Human immunodeficiency virus [HIV] disease: Secondary | ICD-10-CM

## 2016-12-29 LAB — CBC WITH DIFFERENTIAL/PLATELET
BASOS ABS: 0 {cells}/uL (ref 0–200)
Basophils Relative: 0 %
EOS PCT: 1 %
Eosinophils Absolute: 41 cells/uL (ref 15–500)
HCT: 41.3 % (ref 35.0–45.0)
Hemoglobin: 13.2 g/dL (ref 11.7–15.5)
LYMPHS PCT: 36 %
Lymphs Abs: 1476 cells/uL (ref 850–3900)
MCH: 28.8 pg (ref 27.0–33.0)
MCHC: 32 g/dL (ref 32.0–36.0)
MCV: 90.2 fL (ref 80.0–100.0)
MONOS PCT: 6 %
MPV: 10.4 fL (ref 7.5–12.5)
Monocytes Absolute: 246 cells/uL (ref 200–950)
NEUTROS ABS: 2337 {cells}/uL (ref 1500–7800)
NEUTROS PCT: 57 %
PLATELETS: 240 10*3/uL (ref 140–400)
RBC: 4.58 MIL/uL (ref 3.80–5.10)
RDW: 15.3 % — AB (ref 11.0–15.0)
WBC: 4.1 10*3/uL (ref 3.8–10.8)

## 2016-12-29 LAB — COMPREHENSIVE METABOLIC PANEL
ALT: 26 U/L (ref 6–29)
AST: 24 U/L (ref 10–35)
Albumin: 3.9 g/dL (ref 3.6–5.1)
Alkaline Phosphatase: 133 U/L — ABNORMAL HIGH (ref 33–130)
BUN: 17 mg/dL (ref 7–25)
CHLORIDE: 106 mmol/L (ref 98–110)
CO2: 27 mmol/L (ref 20–31)
CREATININE: 0.83 mg/dL (ref 0.50–0.99)
Calcium: 9.2 mg/dL (ref 8.6–10.4)
Glucose, Bld: 100 mg/dL — ABNORMAL HIGH (ref 65–99)
POTASSIUM: 4 mmol/L (ref 3.5–5.3)
SODIUM: 141 mmol/L (ref 135–146)
Total Bilirubin: 0.3 mg/dL (ref 0.2–1.2)
Total Protein: 7 g/dL (ref 6.1–8.1)

## 2016-12-30 LAB — RPR

## 2016-12-31 LAB — T-HELPER CELLS (CD4) COUNT (NOT AT ARMC)
CD4 % Helper T Cell: 30 % — ABNORMAL LOW (ref 33–55)
CD4 T CELL ABS: 440 /uL (ref 400–2700)

## 2016-12-31 LAB — HIV-1 RNA QUANT-NO REFLEX-BLD
HIV 1 RNA QUANT: 27 {copies}/mL — AB
HIV-1 RNA QUANT, LOG: 1.43 {Log_copies}/mL — AB

## 2017-01-19 ENCOUNTER — Ambulatory Visit: Payer: 59 | Admitting: Internal Medicine

## 2017-03-10 ENCOUNTER — Telehealth: Payer: Self-pay

## 2017-03-10 NOTE — Telephone Encounter (Signed)
Patient is calling to request samples of [name].  Her husbands business folded and they are without health insurance.  She called last month and was told what documents would be needed to apply for ADAP and or [employer]. AT this point she has not applied.  She states her husband controls the finances and she does not have access to the information.  Explained to patient financial information is confidential and she will not qualify without financial documentation which means she will be with out her life saving medication.   Laverle Patter, RN

## 2017-03-15 ENCOUNTER — Encounter: Payer: Self-pay | Admitting: Internal Medicine

## 2017-03-15 ENCOUNTER — Ambulatory Visit: Payer: Self-pay

## 2017-03-23 ENCOUNTER — Other Ambulatory Visit: Payer: Self-pay | Admitting: *Deleted

## 2017-03-23 ENCOUNTER — Telehealth: Payer: Self-pay | Admitting: Internal Medicine

## 2017-03-23 DIAGNOSIS — B20 Human immunodeficiency virus [HIV] disease: Secondary | ICD-10-CM

## 2017-03-23 MED ORDER — EFAVIRENZ-EMTRICITAB-TENOFOVIR 600-200-300 MG PO TABS: 1.0000 | ORAL_TABLET | Freq: Every day | ORAL | 1 refills | Status: DC

## 2017-03-23 NOTE — Telephone Encounter (Signed)
Sent. thanks

## 2017-03-23 NOTE — Telephone Encounter (Signed)
Patient's adap has been approved.  [facility] is needing prescription sent in please.

## 2017-05-03 ENCOUNTER — Other Ambulatory Visit: Payer: Self-pay | Admitting: Internal Medicine

## 2017-05-03 DIAGNOSIS — B20 Human immunodeficiency virus [HIV] disease: Secondary | ICD-10-CM

## 2017-05-24 ENCOUNTER — Ambulatory Visit: Payer: 59

## 2017-05-24 ENCOUNTER — Telehealth: Payer: Self-pay | Admitting: Internal Medicine

## 2017-05-24 DIAGNOSIS — B20 Human immunodeficiency virus [HIV] disease: Secondary | ICD-10-CM

## 2017-05-24 MED ORDER — EFAVIRENZ-EMTRICITAB-TENOFOVIR 600-200-300 MG PO TABS: 1.0000 | ORAL_TABLET | Freq: Every day | ORAL | 0 refills | Status: DC

## 2017-05-24 NOTE — Telephone Encounter (Signed)
Patient has no more refills left on her [name].  She goes to [facility] on [address].

## 2017-05-24 NOTE — Telephone Encounter (Signed)
Patient's last appt was 03/03/2016.  Needs lab and MD appts.

## 2017-05-27 ENCOUNTER — Encounter: Payer: Self-pay | Admitting: Internal Medicine

## 2017-06-10 ENCOUNTER — Other Ambulatory Visit: Payer: Self-pay

## 2017-06-10 DIAGNOSIS — B20 Human immunodeficiency virus [HIV] disease: Secondary | ICD-10-CM

## 2017-06-10 LAB — COMPLETE METABOLIC PANEL WITH GFR
ALT: 15 U/L (ref 6–29)
AST: 18 U/L (ref 10–35)
Albumin: 4.1 g/dL (ref 3.6–5.1)
Alkaline Phosphatase: 127 U/L (ref 33–130)
BUN: 9 mg/dL (ref 7–25)
CO2: 27 mmol/L (ref 20–32)
Calcium: 9.5 mg/dL (ref 8.6–10.4)
Chloride: 106 mmol/L (ref 98–110)
Creat: 0.86 mg/dL (ref 0.50–0.99)
GFR, EST AFRICAN AMERICAN: 85 mL/min (ref >=60)
GFR, EST NON AFRICAN AMERICAN: 74 mL/min (ref >=60)
GLUCOSE: 95 mg/dL (ref 65–99)
POTASSIUM: 3.9 mmol/L (ref 3.5–5.3)
SODIUM: 141 mmol/L (ref 135–146)
Total Bilirubin: 0.3 mg/dL (ref 0.2–1.2)
Total Protein: 6.8 g/dL (ref 6.1–8.1)

## 2017-06-10 LAB — CBC WITH DIFFERENTIAL/PLATELET
BASOS ABS: 30 {cells}/uL (ref 0–200)
Basophils Relative: 1 %
EOS ABS: 60 {cells}/uL (ref 15–500)
EOS PCT: 2 %
HCT: 39.3 % (ref 35.0–45.0)
HEMOGLOBIN: 12.7 g/dL (ref 11.7–15.5)
LYMPHS ABS: 960 {cells}/uL (ref 850–3900)
Lymphocytes Relative: 32 %
MCH: 28.9 pg (ref 27.0–33.0)
MCHC: 32.3 g/dL (ref 32.0–36.0)
MCV: 89.5 fL (ref 80.0–100.0)
MONOS PCT: 5 %
MPV: 10.4 fL (ref 7.5–12.5)
Monocytes Absolute: 150 cells/uL — ABNORMAL LOW (ref 200–950)
NEUTROS ABS: 1800 {cells}/uL (ref 1500–7800)
Neutrophils Relative %: 60 %
PLATELETS: 255 10*3/uL (ref 140–400)
RBC: 4.39 MIL/uL (ref 3.80–5.10)
RDW: 15.5 % — ABNORMAL HIGH (ref 11.0–15.0)
WBC: 3 10*3/uL — ABNORMAL LOW (ref 3.8–10.8)

## 2017-06-11 LAB — T-HELPER CELL (CD4) - (RCID CLINIC ONLY)
CD4 T CELL ABS: 390 /uL — AB (ref 400–2700)
CD4 T CELL HELPER: 37 % (ref 33–55)

## 2017-06-14 LAB — HIV-1 RNA QUANT-NO REFLEX-BLD
HIV 1 RNA QUANT: 28 {copies}/mL — AB
HIV-1 RNA Quant, Log: 1.45 Log copies/mL — ABNORMAL HIGH

## 2017-06-24 ENCOUNTER — Ambulatory Visit (INDEPENDENT_AMBULATORY_CARE_PROVIDER_SITE_OTHER): Payer: Self-pay | Admitting: Internal Medicine

## 2017-06-24 VITALS — BP 137/84 | HR 61 | Temp 98.0°F | Wt 127.0 lb

## 2017-06-24 DIAGNOSIS — Z0000 Encounter for general adult medical examination without abnormal findings: Secondary | ICD-10-CM

## 2017-06-24 DIAGNOSIS — I10 Essential (primary) hypertension: Secondary | ICD-10-CM

## 2017-06-24 DIAGNOSIS — B20 Human immunodeficiency virus [HIV] disease: Secondary | ICD-10-CM

## 2017-06-24 MED ORDER — EFAVIRENZ-EMTRICITAB-TENOFOVIR 600-200-300 MG PO TABS: 1.0000 | ORAL_TABLET | Freq: Every day | ORAL | 11 refills | Status: DC

## 2017-06-24 NOTE — Progress Notes (Signed)
Rfv: hiv disease follow up  Patient ID: Victoria Coffey, female   DOB: 01-08-1956, 61 y.o.   MRN: [id number]  HPI 61 yo F, with well controlled hiv disease, cd 4ct 390/VL 26, currently atripla. She has attempted to change to newer ART but did not tolerate taking genvoya. She is otherwise in good health. She denies any recent illnesses. Tolerates atripla without difficulty. She denies any insomnia, denies any signs of depression. She has upcoming mammogram scheduled next month.  Outpatient Encounter Prescriptions as of 06/24/2017  Medication Sig  . efavirenz-emtricitabine-tenofovir ([name]) 600-200-300 MG tablet Take 1 tablet by mouth at bedtime.  . hydrochlorothiazide (MICROZIDE) 12.5 MG capsule TAKE 1 CAPSULE (12.5 MG TOTAL) BY MOUTH DAILY.   No facility-administered encounter medications on file as of 06/24/2017.      Patient Active Problem List   Diagnosis Date Noted  . Rash and nonspecific skin eruption 11/08/2013  . History of breast cancer 12/20/2012  . HYPERTENSION, BENIGN ESSENTIAL 08/11/2007  . HIV DISEASE 04/14/2007     Health Maintenance Due  Topic Date Due  . TETANUS/TDAP  07/22/1975  . MAMMOGRAM  07/21/2006  . COLONOSCOPY  07/21/2006  . PAP SMEAR  11/14/2011  . INFLUENZA VACCINE  06/02/2017    Social History  Substance Use Topics  . Smoking status: Never Smoker  . Smokeless tobacco: Never Used  . Alcohol use No   Review of Systems Review of Systems  Constitutional: Negative for fever, chills, diaphoresis, activity change, appetite change, fatigue and unexpected weight change.  HENT: Negative for congestion, sore throat, rhinorrhea, sneezing, trouble swallowing and sinus pressure.  Eyes: Negative for photophobia and visual disturbance.  Respiratory: Negative for cough, chest tightness, shortness of breath, wheezing and stridor.  Cardiovascular: Negative for chest pain, palpitations and leg swelling.  Gastrointestinal: Negative for nausea, vomiting, abdominal  pain, diarrhea, constipation, blood in stool, abdominal distention and anal bleeding.  Genitourinary: Negative for dysuria, hematuria, flank pain and difficulty urinating.  Musculoskeletal: Negative for myalgias, back pain, joint swelling, arthralgias and gait problem.  Skin: Negative for color change, pallor, rash and wound.  Neurological: Negative for dizziness, tremors, weakness and light-headedness.  Hematological: Negative for adenopathy. Does not bruise/bleed easily.  Psychiatric/Behavioral: Negative for behavioral problems, confusion, sleep disturbance, dysphoric mood, decreased concentration and agitation.    Physical Exam   BP (!) 152/88   Wt 127 lb (57.6 kg)   BMI 23.23 kg/m  Physical Exam  Constitutional:  oriented to person, place, and time. appears well-developed and well-nourished. No distress.  HENT: [place]/AT, PERRLA, no scleral icterus Mouth/Throat: Oropharynx is clear and moist. No oropharyngeal exudate.  Cardiovascular: Normal rate, regular rhythm and normal heart sounds. Exam reveals no gallop and no friction rub.  No murmur heard.  Pulmonary/Chest: Effort normal and breath sounds normal. No respiratory distress.  has no wheezes.  Neck = supple, no nuchal rigidity Lymphadenopathy: no cervical adenopathy. No axillary adenopathy Neurological: alert and oriented to person, place, and time.  Skin: Skin is warm and dry. No rash noted. No erythema.  Psychiatric: a normal mood and affect.  behavior is normal.    Lab Results  Component Value Date   CD4TCELL 37 06/10/2017   Lab Results  Component Value Date   CD4TABS 390 (L) 06/10/2017   CD4TABS 440 12/29/2016   CD4TABS 510 03/03/2016   Lab Results  Component Value Date   HIV1RNAQUANT 28 (H) 06/10/2017   Lab Results  Component Value Date   HEPBSAB NO 12/27/2006  Lab Results  Component Value Date   LABRPR NON REAC 12/29/2016    CBC Lab Results  Component Value Date   WBC 3.0 (L) 06/10/2017   RBC 4.39  06/10/2017   HGB 12.7 06/10/2017   HCT 39.3 06/10/2017   PLT 255 06/10/2017   MCV 89.5 06/10/2017   MCH 28.9 06/10/2017   MCHC 32.3 06/10/2017   RDW 15.5 (H) 06/10/2017   LYMPHSABS 960 06/10/2017   MONOABS 150 (L) 06/10/2017   EOSABS 60 06/10/2017    BMET Lab Results  Component Value Date   NA 141 06/10/2017   K 3.9 06/10/2017   CL 106 06/10/2017   CO2 27 06/10/2017   GLUCOSE 95 06/10/2017   BUN 9 06/10/2017   CREATININE 0.86 06/10/2017   CALCIUM 9.5 06/10/2017   GFRNONAA 74 06/10/2017   GFRAA 85 06/10/2017   Assessment and Plan   HIV disease= spent the majority of hte visit discussing change in therapy predominantly getting her off of TDF and switching to TAF. Either as descovy plus efavirenz vs newer agents such as biktarvy. She will be thinking about this over the next few months.   Health maintenance = she has mammo scheduled for next month. Plan for flu shot in the Fall  prehypertension = repeat BP in clinic suggests that the initial reading was artificially elevated/white coat syndrome.  Spent 35 min with patient with greater than 50% in face to face counseling regarding hiv disease

## 2017-07-26 ENCOUNTER — Ambulatory Visit (INDEPENDENT_AMBULATORY_CARE_PROVIDER_SITE_OTHER): Payer: Self-pay | Admitting: *Deleted

## 2017-07-26 DIAGNOSIS — Z23 Encounter for immunization: Secondary | ICD-10-CM

## 2017-11-22 ENCOUNTER — Ambulatory Visit: Payer: Self-pay

## 2017-11-25 ENCOUNTER — Encounter: Payer: Self-pay | Admitting: Internal Medicine

## 2017-12-16 ENCOUNTER — Other Ambulatory Visit: Payer: Self-pay | Admitting: *Deleted

## 2017-12-16 DIAGNOSIS — B20 Human immunodeficiency virus [HIV] disease: Secondary | ICD-10-CM

## 2017-12-16 NOTE — Progress Notes (Signed)
ric

## 2017-12-21 ENCOUNTER — Other Ambulatory Visit: Payer: Self-pay

## 2017-12-21 DIAGNOSIS — B20 Human immunodeficiency virus [HIV] disease: Secondary | ICD-10-CM

## 2017-12-22 LAB — CBC WITH DIFFERENTIAL/PLATELET
BASOS ABS: 8 {cells}/uL (ref 0–200)
BASOS PCT: 0.2 %
EOS ABS: 21 {cells}/uL (ref 15–500)
Eosinophils Relative: 0.5 %
HCT: 37.7 % (ref 35.0–45.0)
Hemoglobin: 12.7 g/dL (ref 11.7–15.5)
Lymphs Abs: 1201 cells/uL (ref 850–3900)
MCH: 29.2 pg (ref 27.0–33.0)
MCHC: 33.7 g/dL (ref 32.0–36.0)
MCV: 86.7 fL (ref 80.0–100.0)
MPV: 10.8 fL (ref 7.5–12.5)
Monocytes Relative: 4.8 %
NEUTROS PCT: 65.9 %
Neutro Abs: 2768 cells/uL (ref 1500–7800)
PLATELETS: 221 10*3/uL (ref 140–400)
RBC: 4.35 10*6/uL (ref 3.80–5.10)
RDW: 13.9 % (ref 11.0–15.0)
Total Lymphocyte: 28.6 %
WBC: 4.2 10*3/uL (ref 3.8–10.8)
WBCMIX: 202 {cells}/uL (ref 200–950)

## 2017-12-22 LAB — COMPLETE METABOLIC PANEL WITH GFR
AG Ratio: 1.5 (calc) (ref 1.0–2.5)
ALKALINE PHOSPHATASE (APISO): 147 U/L — AB (ref 33–130)
ALT: 18 U/L (ref 6–29)
AST: 21 U/L (ref 10–35)
Albumin: 3.9 g/dL (ref 3.6–5.1)
BILIRUBIN TOTAL: 0.3 mg/dL (ref 0.2–1.2)
BUN: 15 mg/dL (ref 7–25)
CHLORIDE: 108 mmol/L (ref 98–110)
CO2: 26 mmol/L (ref 20–32)
CREATININE: 0.85 mg/dL (ref 0.50–0.99)
Calcium: 9.1 mg/dL (ref 8.6–10.4)
GFR, Est African American: 86 mL/min/{1.73_m2} (ref >=60)
GFR, Est Non African American: 74 mL/min/{1.73_m2} (ref >=60)
GLOBULIN: 2.6 g/dL (ref 1.9–3.7)
Glucose, Bld: 95 mg/dL (ref 65–99)
POTASSIUM: 4.5 mmol/L (ref 3.5–5.3)
SODIUM: 141 mmol/L (ref 135–146)
Total Protein: 6.5 g/dL (ref 6.1–8.1)

## 2017-12-22 LAB — T-HELPER CELL (CD4) - (RCID CLINIC ONLY)
CD4 T CELL HELPER: 35 % (ref 33–55)
CD4 T Cell Abs: 410 /uL (ref 400–2700)

## 2017-12-22 LAB — RPR: RPR: NONREACTIVE

## 2017-12-23 LAB — HIV-1 RNA QUANT-NO REFLEX-BLD
HIV 1 RNA QUANT: 40 {copies}/mL — AB
HIV-1 RNA QUANT, LOG: 1.6 {Log_copies}/mL — AB

## 2018-01-04 ENCOUNTER — Encounter: Payer: Self-pay | Admitting: Internal Medicine

## 2018-01-04 ENCOUNTER — Ambulatory Visit (INDEPENDENT_AMBULATORY_CARE_PROVIDER_SITE_OTHER): Payer: Self-pay | Admitting: Internal Medicine

## 2018-01-04 VITALS — BP 146/89 | HR 82 | Temp 98.3°F | Ht 60.0 in | Wt 138.0 lb

## 2018-01-04 DIAGNOSIS — B20 Human immunodeficiency virus [HIV] disease: Secondary | ICD-10-CM

## 2018-01-04 DIAGNOSIS — R635 Abnormal weight gain: Secondary | ICD-10-CM

## 2018-01-04 DIAGNOSIS — I10 Essential (primary) hypertension: Secondary | ICD-10-CM

## 2018-01-04 NOTE — Progress Notes (Signed)
RFV: follow up for hiv disease  Patient ID: Victoria Coffey, female   DOB: 18-Mar-1956, 62 y.o.   MRN: [id number]  HPI Yazleemar is a 62yo F with well controlled hiv disease, CD 4 count of 410/VL 40, currently on atripla. She reports having sinus infection since 3 days . Using netty pot and vicks inhaler  Hx of breast ca.   Colonoscopy in the past - 49yr.   Outpatient Encounter Medications as of 01/04/2018  Medication Sig  . efavirenz-emtricitabine-tenofovir ([name]) 600-200-300 MG tablet Take 1 tablet by mouth at bedtime.  . hydrochlorothiazide (MICROZIDE) 12.5 MG capsule TAKE 1 CAPSULE (12.5 MG TOTAL) BY MOUTH DAILY. (Patient not taking: Reported on 06/24/2017)   No facility-administered encounter medications on file as of 01/04/2018.      Patient Active Problem List   Diagnosis Date Noted  . Rash and nonspecific skin eruption 11/08/2013  . History of breast cancer 12/20/2012  . HYPERTENSION, BENIGN ESSENTIAL 08/11/2007  . HIV DISEASE 04/14/2007     Health Maintenance Due  Topic Date Due  . TETANUS/TDAP  07/22/1975  . MAMMOGRAM  07/21/2006  . COLONOSCOPY  07/21/2006  . PAP SMEAR  11/14/2011     Review of Systems Review of Systems  Constitutional: Negative for fever, chills, diaphoresis, activity change, appetite change, fatigue and unexpected weight change.  HENT: positive for congestion and sinus pressure otherwise negative for sore throat, rhinorrhea, sneezing, trouble swallowing  Eyes: Negative for photophobia and visual disturbance.  Respiratory: Negative for cough, chest tightness, shortness of breath, wheezing and stridor.  Cardiovascular: Negative for chest pain, palpitations and leg swelling.  Gastrointestinal: Negative for nausea, vomiting, abdominal pain, diarrhea, constipation, blood in stool, abdominal distention and anal bleeding.  Genitourinary: Negative for dysuria, hematuria, flank pain and difficulty urinating.  Musculoskeletal: Negative for myalgias, back  pain, joint swelling, arthralgias and gait problem.  Skin: Negative for color change, pallor, rash and wound.  Neurological: Negative for dizziness, tremors, weakness and light-headedness.  Hematological: Negative for adenopathy. Does not bruise/bleed easily.  Psychiatric/Behavioral: Negative for behavioral problems, confusion, sleep disturbance, dysphoric mood, decreased concentration and agitation.    Physical Exam   BP (!) 146/89   Pulse 82   Temp 98.3 F (36.8 C) (Oral)   Ht 5' (1.524 m)   Wt 138 lb (62.6 kg)   BMI 26.95 kg/m   Physical Exam  Constitutional:  oriented to person, place, and time. appears well-developed and well-nourished. No distress.  HENT: [place]/AT, PERRLA, no scleral icterus Mouth/Throat: Oropharynx is clear and moist. No oropharyngeal exudate.  Cardiovascular: Normal rate, regular rhythm and normal heart sounds. Exam reveals no gallop and no friction rub.  No murmur heard.  Pulmonary/Chest: Effort normal and breath sounds normal. No respiratory distress.  has no wheezes.  Neck = supple, no nuchal rigidity Abdominal: Soft. Bowel sounds are normal.  exhibits no distension. There is no tenderness.  Lymphadenopathy: no cervical adenopathy. No axillary adenopathy Neurological: alert and oriented to person, place, and time.  Skin: Skin is warm and dry. No rash noted. No erythema.  Psychiatric: a normal mood and affect.  behavior is normal.   Lab Results  Component Value Date   CD4TCELL 35 12/21/2017   Lab Results  Component Value Date   CD4TABS 410 12/21/2017   CD4TABS 390 (L) 06/10/2017   CD4TABS 440 12/29/2016   Lab Results  Component Value Date   HIV1RNAQUANT 40 (H) 12/21/2017   Lab Results  Component Value Date   HEPBSAB NO 12/27/2006  Lab Results  Component Value Date   LABRPR NON-REACTIVE 12/21/2017    CBC Lab Results  Component Value Date   WBC 4.2 12/21/2017   RBC 4.35 12/21/2017   HGB 12.7 12/21/2017   HCT 37.7 12/21/2017   PLT 221  12/21/2017   MCV 86.7 12/21/2017   MCH 29.2 12/21/2017   MCHC 33.7 12/21/2017   RDW 13.9 12/21/2017   LYMPHSABS 1,201 12/21/2017   MONOABS 150 (L) 06/10/2017   EOSABS 21 12/21/2017    BMET Lab Results  Component Value Date   NA 141 12/21/2017   K 4.5 12/21/2017   CL 108 12/21/2017   CO2 26 12/21/2017   GLUCOSE 95 12/21/2017   BUN 15 12/21/2017   CREATININE 0.85 12/21/2017   CALCIUM 9.1 12/21/2017   GFRNONAA 74 12/21/2017   GFRAA 86 12/21/2017      Assessment and Plan  weigt gain = will keep current BP meds/ plan with exercise  hiv disease = well controlled. Continue on atripla. Discussed to consider switch to biktarvy vs. TAF-Emtricitabine-EFV formulation  htn = slightly elevated but previously well controlled. thought to be due ot weight gain. Will see at next visit if it is back to normal limits with weight loss plna

## 2018-04-21 ENCOUNTER — Other Ambulatory Visit: Payer: Self-pay

## 2018-04-21 DIAGNOSIS — B20 Human immunodeficiency virus [HIV] disease: Secondary | ICD-10-CM

## 2018-04-21 LAB — CBC WITH DIFFERENTIAL/PLATELET
BASOS ABS: 9 {cells}/uL (ref 0–200)
Basophils Relative: 0.3 %
EOS ABS: 39 {cells}/uL (ref 15–500)
Eosinophils Relative: 1.3 %
HCT: 37.7 % (ref 35.0–45.0)
HEMOGLOBIN: 12.6 g/dL (ref 11.7–15.5)
Lymphs Abs: 1155 cells/uL (ref 850–3900)
MCH: 28.8 pg (ref 27.0–33.0)
MCHC: 33.4 g/dL (ref 32.0–36.0)
MCV: 86.1 fL (ref 80.0–100.0)
MPV: 11.4 fL (ref 7.5–12.5)
Monocytes Relative: 5.3 %
NEUTROS ABS: 1638 {cells}/uL (ref 1500–7800)
Neutrophils Relative %: 54.6 %
Platelets: 226 10*3/uL (ref 140–400)
RBC: 4.38 10*6/uL (ref 3.80–5.10)
RDW: 14 % (ref 11.0–15.0)
Total Lymphocyte: 38.5 %
WBC mixed population: 159 cells/uL — ABNORMAL LOW (ref 200–950)
WBC: 3 10*3/uL — ABNORMAL LOW (ref 3.8–10.8)

## 2018-04-21 LAB — COMPLETE METABOLIC PANEL WITH GFR
AG RATIO: 1.4 (calc) (ref 1.0–2.5)
ALBUMIN MSPROF: 3.9 g/dL (ref 3.6–5.1)
ALT: 18 U/L (ref 6–29)
AST: 23 U/L (ref 10–35)
Alkaline phosphatase (APISO): 159 U/L — ABNORMAL HIGH (ref 33–130)
BUN: 10 mg/dL (ref 7–25)
CALCIUM: 9.2 mg/dL (ref 8.6–10.4)
CO2: 25 mmol/L (ref 20–32)
Chloride: 107 mmol/L (ref 98–110)
Creat: 0.88 mg/dL (ref 0.50–0.99)
GFR, EST AFRICAN AMERICAN: 82 mL/min/{1.73_m2} (ref >=60)
GFR, EST NON AFRICAN AMERICAN: 71 mL/min/{1.73_m2} (ref >=60)
GLOBULIN: 2.8 g/dL (ref 1.9–3.7)
Glucose, Bld: 101 mg/dL — ABNORMAL HIGH (ref 65–99)
Potassium: 3.9 mmol/L (ref 3.5–5.3)
SODIUM: 141 mmol/L (ref 135–146)
TOTAL PROTEIN: 6.7 g/dL (ref 6.1–8.1)
Total Bilirubin: 0.3 mg/dL (ref 0.2–1.2)

## 2018-04-22 LAB — T-HELPER CELL (CD4) - (RCID CLINIC ONLY)
CD4 T CELL HELPER: 38 % (ref 33–55)
CD4 T Cell Abs: 480 /uL (ref 400–2700)

## 2018-04-25 ENCOUNTER — Other Ambulatory Visit: Payer: Self-pay

## 2018-04-25 LAB — HIV-1 RNA QUANT-NO REFLEX-BLD
HIV 1 RNA QUANT: DETECTED {copies}/mL — AB
HIV-1 RNA QUANT, LOG: DETECTED {Log_copies}/mL — AB

## 2018-05-09 ENCOUNTER — Ambulatory Visit: Payer: Self-pay

## 2018-05-09 ENCOUNTER — Ambulatory Visit (INDEPENDENT_AMBULATORY_CARE_PROVIDER_SITE_OTHER): Payer: Self-pay | Admitting: Internal Medicine

## 2018-05-09 VITALS — BP 148/85 | HR 73 | Temp 97.8°F | Wt 125.0 lb

## 2018-05-09 DIAGNOSIS — Z23 Encounter for immunization: Secondary | ICD-10-CM

## 2018-05-09 DIAGNOSIS — R634 Abnormal weight loss: Secondary | ICD-10-CM

## 2018-05-09 DIAGNOSIS — Z0000 Encounter for general adult medical examination without abnormal findings: Secondary | ICD-10-CM

## 2018-05-09 DIAGNOSIS — B20 Human immunodeficiency virus [HIV] disease: Secondary | ICD-10-CM

## 2018-05-09 DIAGNOSIS — I10 Essential (primary) hypertension: Secondary | ICD-10-CM

## 2018-05-09 MED ORDER — EMTRICITAB-RILPIVIR-TENOFOV AF 200-25-25 MG PO TABS: 1.0000 | ORAL_TABLET | Freq: Every day | ORAL | 11 refills | Status: DC

## 2018-05-09 NOTE — Progress Notes (Signed)
RFV: follow up for hiv disease  Patient ID: Victoria Coffey, female   DOB: [birth year], 62 y.o.   MRN: [id number]  HPI Victoria Coffey is a 62yo F with HIV disease, CD 4 count of 480/VL<20, on atripla. In good health. Had family stressors with mother and brother being ill. She remains to have great adherence with her atripla. She saw some "class action" advisement regarding atripla.   ROS: 12 point ros is negative  Outpatient Encounter Medications as of 05/09/2018  Medication Sig  . efavirenz-emtricitabine-tenofovir ([name]) 600-200-300 MG tablet Take 1 tablet by mouth at bedtime.  . hydrochlorothiazide (MICROZIDE) 12.5 MG capsule TAKE 1 CAPSULE (12.5 MG TOTAL) BY MOUTH DAILY. (Patient not taking: Reported on 05/09/2018)   No facility-administered encounter medications on file as of 05/09/2018.      Patient Active Problem List   Diagnosis Date Noted  . Rash and nonspecific skin eruption 11/08/2013  . History of breast cancer 12/20/2012  . HYPERTENSION, BENIGN ESSENTIAL 08/11/2007  . HIV DISEASE 04/14/2007     Health Maintenance Due  Topic Date Due  . TETANUS/TDAP  07/22/1975  . MAMMOGRAM  07/21/2006  . COLONOSCOPY  07/21/2006  . PAP SMEAR  11/14/2011    Social History   Tobacco Use  . Smoking status: Never Smoker  . Smokeless tobacco: Never Used  Substance Use Topics  . Alcohol use: No  . Drug use: No   Physical Exam   BP (!) 148/85   Pulse 73   Temp 97.8 F (36.6 C) (Oral)   Wt 125 lb (56.7 kg)   BMI 24.41 kg/m   Physical Exam  Constitutional:  oriented to person, place, and time. appears well-developed and well-nourished. No distress.  HENT: [place]/AT, PERRLA, no scleral icterus Mouth/Throat: Oropharynx is clear and moist. No oropharyngeal exudate.  Cardiovascular: Normal rate, regular rhythm and normal heart sounds. Exam reveals no gallop and no friction rub.  No murmur heard.  Pulmonary/Chest: Effort normal and breath sounds normal. No respiratory distress.  has no wheezes.    Neck = supple, no nuchal rigidity Lymphadenopathy: no cervical adenopathy. No axillary adenopathy Neurological: alert and oriented to person, place, and time.  Skin: Skin is warm and dry. No rash noted. No erythema.  Psychiatric: a normal mood and affect.  behavior is normal.   Lab Results  Component Value Date   CD4TCELL 38 04/21/2018   Lab Results  Component Value Date   CD4TABS 480 04/21/2018   CD4TABS 410 12/21/2017   CD4TABS 390 (L) 06/10/2017   Lab Results  Component Value Date   HIV1RNAQUANT <20 DETECTED (A) 04/21/2018   Lab Results  Component Value Date   HEPBSAB NO 12/27/2006   Lab Results  Component Value Date   LABRPR NON-REACTIVE 12/21/2017    CBC Lab Results  Component Value Date   WBC 3.0 (L) 04/21/2018   RBC 4.38 04/21/2018   HGB 12.6 04/21/2018   HCT 37.7 04/21/2018   PLT 226 04/21/2018   MCV 86.1 04/21/2018   MCH 28.8 04/21/2018   MCHC 33.4 04/21/2018   RDW 14.0 04/21/2018   LYMPHSABS 1,155 04/21/2018   MONOABS 150 (L) 06/10/2017   EOSABS 39 04/21/2018    BMET Lab Results  Component Value Date   NA 141 04/21/2018   K 3.9 04/21/2018   CL 107 04/21/2018   CO2 25 04/21/2018   GLUCOSE 101 (H) 04/21/2018   BUN 10 04/21/2018   CREATININE 0.88 04/21/2018   CALCIUM 9.2 04/21/2018   GFRNONAA 71 04/21/2018  GFRAA 82 04/21/2018      Assessment and Plan  HIV disease - will switch to odefsey. Has 2 more weeks of atripla. Will repeat her viral load in 2 months  HTN - slightly above goal. Will ask her to take her BP meds prior to next visit to repeat BP check and see if need to increase dose.  Weight loss, intentional - continue with decrease portion sizes, decreased carb intake. Increase exercise  Health maintenance -will get mammo scholarship - pneumovaccine-prevnar 13 today

## 2018-05-09 NOTE — Patient Instructions (Signed)
Come back to do lab work in 2 months

## 2018-05-13 ENCOUNTER — Encounter: Payer: Self-pay | Admitting: Internal Medicine

## 2018-07-06 ENCOUNTER — Other Ambulatory Visit: Payer: Self-pay | Admitting: Internal Medicine

## 2018-07-06 ENCOUNTER — Telehealth: Payer: Self-pay

## 2018-07-06 DIAGNOSIS — B20 Human immunodeficiency virus [HIV] disease: Secondary | ICD-10-CM

## 2018-07-06 NOTE — Telephone Encounter (Signed)
Patient called today requesting refills on [name]. Informed patient that she was switched to [facility] during her last visit on 7/8 with [name]. Patient stated that the pharmacy needs a prescription called in for her to get her medication.  Called [facility] on [address] to see if they received the prescription order for [facility] from 7/8. Pharmacist stated they had the prescription and will fill medication for patient to pick up today. [place]

## 2018-07-25 ENCOUNTER — Other Ambulatory Visit: Payer: Self-pay

## 2018-08-10 ENCOUNTER — Encounter: Payer: Self-pay | Admitting: Internal Medicine

## 2018-08-10 ENCOUNTER — Ambulatory Visit: Payer: Self-pay

## 2018-08-19 ENCOUNTER — Other Ambulatory Visit: Payer: Self-pay

## 2018-08-19 DIAGNOSIS — B20 Human immunodeficiency virus [HIV] disease: Secondary | ICD-10-CM

## 2018-08-19 LAB — T-HELPER CELL (CD4) - (RCID CLINIC ONLY)
CD4 T CELL HELPER: 32 % — AB (ref 33–55)
CD4 T Cell Abs: 370 /uL — ABNORMAL LOW (ref 400–2700)

## 2018-08-23 LAB — HIV-1 RNA QUANT-NO REFLEX-BLD
HIV 1 RNA QUANT: DETECTED {copies}/mL — AB
HIV-1 RNA Quant, Log: <1.3 Log copies/mL — AB

## 2018-09-12 ENCOUNTER — Encounter: Payer: Self-pay | Admitting: Internal Medicine

## 2018-09-12 ENCOUNTER — Ambulatory Visit (INDEPENDENT_AMBULATORY_CARE_PROVIDER_SITE_OTHER): Payer: Self-pay | Admitting: Internal Medicine

## 2018-09-12 VITALS — BP 153/79 | HR 76 | Temp 98.2°F | Ht 61.0 in | Wt 127.0 lb

## 2018-09-12 DIAGNOSIS — Z79899 Other long term (current) drug therapy: Secondary | ICD-10-CM

## 2018-09-12 DIAGNOSIS — J069 Acute upper respiratory infection, unspecified: Secondary | ICD-10-CM

## 2018-09-12 DIAGNOSIS — I10 Essential (primary) hypertension: Secondary | ICD-10-CM

## 2018-09-12 DIAGNOSIS — B20 Human immunodeficiency virus [HIV] disease: Secondary | ICD-10-CM

## 2018-09-12 NOTE — Progress Notes (Signed)
RFV  hiv disease Patient ID: Victoria Coffey, female   DOB: [birth year], 62 y.o.   MRN: [id number]  HPI Victoria Coffey is a 62yo F with hiv disease, on 80., CD 4 count of 370/VL<20, doing well with adherence. She reports that she has cold,congestion for nearly 2 weeks. Denies fever or chills. No productive cough. Feeling that she is improving.  Outpatient Encounter Medications as of 09/12/2018  Medication Sig  . emtricitabine-rilpivir-tenofovir AF (ODEFSEY) 200-25-25 MG TABS tablet Take 1 tablet by mouth daily with breakfast.  . hydrochlorothiazide (MICROZIDE) 12.5 MG capsule TAKE 1 CAPSULE (12.5 MG TOTAL) BY MOUTH DAILY.   No facility-administered encounter medications on file as of 09/12/2018.      Patient Active Problem List   Diagnosis Date Noted  . Rash and nonspecific skin eruption 11/08/2013  . History of breast cancer 12/20/2012  . HYPERTENSION, BENIGN ESSENTIAL 08/11/2007  . HIV DISEASE 04/14/2007     Health Maintenance Due  Topic Date Due  . TETANUS/TDAP  07/22/1975  . MAMMOGRAM  07/21/2006  . COLONOSCOPY  07/21/2006  . PAP SMEAR  11/14/2011  . INFLUENZA VACCINE  06/02/2018    Social History   Tobacco Use  . Smoking status: Never Smoker  . Smokeless tobacco: Never Used  Substance Use Topics  . Alcohol use: No  . Drug use: No   Review of Systems Review of Systems  Constitutional: Negative for fever, chills, diaphoresis, activity change, appetite change, fatigue and unexpected weight change.  HENT: nasal congestion Eyes: Negative for photophobia and visual disturbance.  Respiratory: Negative for cough, chest tightness, shortness of breath, wheezing and stridor.  Cardiovascular: Negative for chest pain, palpitations and leg swelling.  Gastrointestinal: Negative for nausea, vomiting, abdominal pain, diarrhea, constipation, blood in stool, abdominal distention and anal bleeding.  Genitourinary: Negative for dysuria, hematuria, flank pain and difficulty urinating.    Musculoskeletal: Negative for myalgias, back pain, joint swelling, arthralgias and gait problem.  Skin: Negative for color change, pallor, rash and wound.  Neurological: Negative for dizziness, tremors, weakness and light-headedness.  Hematological: Negative for adenopathy. Does not bruise/bleed easily.  Psychiatric/Behavioral: Negative for behavioral problems, confusion, sleep disturbance, dysphoric mood, decreased concentration and agitation.    Physical Exam   BP (!) 153/79   Pulse 76   Temp 98.2 F (36.8 C)   Ht 5\' 1"  (1.549 m)   Wt 127 lb (57.6 kg)   BMI 24.00 kg/m   Physical Exam  Constitutional:  oriented to person, place, and time. appears well-developed and well-nourished. No distress.  HENT: [place]/AT, PERRLA, no scleral icterus Mouth/Throat: Oropharynx is clear and moist. No oropharyngeal exudate.  Cardiovascular: Normal rate, regular rhythm and normal heart sounds. Exam reveals no gallop and no friction rub.  No murmur heard.  Pulmonary/Chest: Effort normal and breath sounds normal. No respiratory distress.  has no wheezes.  Neck = supple, no nuchal rigidity Lymphadenopathy: no cervical adenopathy. No axillary adenopathy Neurological: alert and oriented to person, place, and time.  Skin: Skin is warm and dry. No rash noted. No erythema.  Psychiatric: a normal mood and affect.  behavior is normal.   Lab Results  Component Value Date   CD4TCELL 32 (L) 08/19/2018   Lab Results  Component Value Date   CD4TABS 370 (L) 08/19/2018   CD4TABS 480 04/21/2018   CD4TABS 410 12/21/2017   Lab Results  Component Value Date   HIV1RNAQUANT <20 DETECTED (A) 08/19/2018   Lab Results  Component Value Date   HEPBSAB NO 12/27/2006  Lab Results  Component Value Date   LABRPR NON-REACTIVE 12/21/2017    CBC Lab Results  Component Value Date   WBC 3.0 (L) 04/21/2018   RBC 4.38 04/21/2018   HGB 12.6 04/21/2018   HCT 37.7 04/21/2018   PLT 226 04/21/2018   MCV 86.1  04/21/2018   MCH 28.8 04/21/2018   MCHC 33.4 04/21/2018   RDW 14.0 04/21/2018   LYMPHSABS 1,155 04/21/2018   MONOABS 150 (L) 06/10/2017   EOSABS 39 04/21/2018    BMET Lab Results  Component Value Date   NA 141 04/21/2018   K 3.9 04/21/2018   CL 107 04/21/2018   CO2 25 04/21/2018   GLUCOSE 101 (H) 04/21/2018   BUN 10 04/21/2018   CREATININE 0.88 04/21/2018   CALCIUM 9.2 04/21/2018   GFRNONAA 71 04/21/2018   GFRAA 82 04/21/2018      Assessment and Plan  [place]- slowly resolving; continue with symptomatic management with OTC meds  hiv = well controlled; tolerating odesfey- plan to refill  Long term medication management = cr is stable. Will repeat BMP to see that cr is stable  Health maintenance = has had flu shot; otherwise up to date  rtc 6 months

## 2018-11-10 ENCOUNTER — Ambulatory Visit: Payer: Self-pay

## 2018-11-30 ENCOUNTER — Encounter: Payer: Self-pay | Admitting: Internal Medicine

## 2019-02-20 ENCOUNTER — Telehealth: Payer: Self-pay | Admitting: Behavioral Health

## 2019-02-20 NOTE — Telephone Encounter (Signed)
Patient called requesting to be switched back to [name].  Patient states she has been taking Odefsey and she has gain an significant amount of weight in her Mid section and she feels it is due to [facility].  Patient states she has continued to try to control weight with Diet and Exercise but states it is not helping.  Informed her [name] would be made aware.  Also patient is aware that she has a lab appointment 03/13/2019 and an office visit appointment with [name] 04/03/2019. Pricilla Riffle RN

## 2019-03-10 ENCOUNTER — Telehealth: Payer: Self-pay | Admitting: Internal Medicine

## 2019-03-10 NOTE — Telephone Encounter (Signed)
COVID-19 Pre-Screening Questions: ° °Do you currently have a fever (>100 °F), chills or unexplained body aches? No  ° °Are you currently experiencing new cough, shortness of breath, sore throat, runny nose? No  °•  °Have you recently travelled outside the state of [place] in the last 14 days? No  °•  °1. Have you been in contact with someone that is currently pending confirmation of Covid19 testing or has been confirmed to have the Covid19 virus?  No  ° °

## 2019-03-13 ENCOUNTER — Other Ambulatory Visit: Payer: Self-pay

## 2019-03-13 DIAGNOSIS — B20 Human immunodeficiency virus [HIV] disease: Secondary | ICD-10-CM

## 2019-03-14 LAB — T-HELPER CELL (CD4) - (RCID CLINIC ONLY)
CD4 % Helper T Cell: 40 % (ref 33–65)
CD4 T Cell Abs: 422 /uL (ref 400–1790)

## 2019-03-20 LAB — COMPLETE METABOLIC PANEL WITH GFR
AG Ratio: 1.4 (calc) (ref 1.0–2.5)
ALT: 21 U/L (ref 6–29)
AST: 19 U/L (ref 10–35)
Albumin: 3.8 g/dL (ref 3.6–5.1)
Alkaline phosphatase (APISO): 114 U/L (ref 37–153)
BUN: 19 mg/dL (ref 7–25)
CO2: 27 mmol/L (ref 20–32)
Calcium: 9.2 mg/dL (ref 8.6–10.4)
Chloride: 108 mmol/L (ref 98–110)
Creat: 0.82 mg/dL (ref 0.50–0.99)
GFR, Est African American: 89 mL/min/{1.73_m2} (ref >=60)
GFR, Est Non African American: 77 mL/min/{1.73_m2} (ref >=60)
Globulin: 2.7 g/dL (calc) (ref 1.9–3.7)
Glucose, Bld: 100 mg/dL — ABNORMAL HIGH (ref 65–99)
Potassium: 4.2 mmol/L (ref 3.5–5.3)
Sodium: 140 mmol/L (ref 135–146)
Total Bilirubin: 0.3 mg/dL (ref 0.2–1.2)
Total Protein: 6.5 g/dL (ref 6.1–8.1)

## 2019-03-20 LAB — CBC WITH DIFFERENTIAL/PLATELET
Absolute Monocytes: 252 cells/uL (ref 200–950)
Basophils Absolute: 10 cells/uL (ref 0–200)
Basophils Relative: 0.3 %
Eosinophils Absolute: 61 cells/uL (ref 15–500)
Eosinophils Relative: 1.8 %
HCT: 38.8 % (ref 35.0–45.0)
Hemoglobin: 13 g/dL (ref 11.7–15.5)
Lymphs Abs: 1200 cells/uL (ref 850–3900)
MCH: 28.6 pg (ref 27.0–33.0)
MCHC: 33.5 g/dL (ref 32.0–36.0)
MCV: 85.5 fL (ref 80.0–100.0)
MPV: 11.5 fL (ref 7.5–12.5)
Monocytes Relative: 7.4 %
Neutro Abs: 1877 cells/uL (ref 1500–7800)
Neutrophils Relative %: 55.2 %
Platelets: 236 10*3/uL (ref 140–400)
RBC: 4.54 10*6/uL (ref 3.80–5.10)
RDW: 14.9 % (ref 11.0–15.0)
Total Lymphocyte: 35.3 %
WBC: 3.4 10*3/uL — ABNORMAL LOW (ref 3.8–10.8)

## 2019-03-20 LAB — HIV-1 RNA QUANT-NO REFLEX-BLD
HIV 1 RNA Quant: <20 copies/mL — AB
HIV-1 RNA Quant, Log: <1.3 Log copies/mL — AB

## 2019-03-20 LAB — RPR: RPR Ser Ql: NONREACTIVE

## 2019-04-03 ENCOUNTER — Encounter: Payer: Self-pay | Admitting: Internal Medicine

## 2019-04-03 ENCOUNTER — Other Ambulatory Visit: Payer: Self-pay

## 2019-04-03 ENCOUNTER — Ambulatory Visit (INDEPENDENT_AMBULATORY_CARE_PROVIDER_SITE_OTHER): Payer: Self-pay | Admitting: Internal Medicine

## 2019-04-03 DIAGNOSIS — B20 Human immunodeficiency virus [HIV] disease: Secondary | ICD-10-CM

## 2019-04-03 DIAGNOSIS — Z79899 Other long term (current) drug therapy: Secondary | ICD-10-CM

## 2019-04-03 DIAGNOSIS — R635 Abnormal weight gain: Secondary | ICD-10-CM

## 2019-04-03 NOTE — Progress Notes (Signed)
RFV: follow up visit for HIV disease/ tele visit  Patient ID: Victoria Coffey, female   DOB: 1956/04/24, 63 y.o.   MRN: [id number]  HPI Victoria Coffey is a 63yo F for hiv disease, cd 4 ct 422/ vl <20. Decreased activity during in quarantine.  - husband is in good health - staying in shelter 135 at home scale - 100months 127lb. Outpatient Encounter Medications as of 04/03/2019  Medication Sig  . emtricitabine-rilpivir-tenofovir AF (ODEFSEY) 200-25-25 MG TABS tablet Take 1 tablet by mouth daily with breakfast.  . hydrochlorothiazide (MICROZIDE) 12.5 MG capsule TAKE 1 CAPSULE (12.5 MG TOTAL) BY MOUTH DAILY.   No facility-administered encounter medications on file as of 04/03/2019.      Patient Active Problem List   Diagnosis Date Noted  . Rash and nonspecific skin eruption 11/08/2013  . History of breast cancer 12/20/2012  . HYPERTENSION, BENIGN ESSENTIAL 08/11/2007  . HIV DISEASE 04/14/2007     Health Maintenance Due  Topic Date Due  . TETANUS/TDAP  07/22/1975  . MAMMOGRAM  07/21/2006  . COLONOSCOPY  07/21/2006  . PAP SMEAR-Modifier  11/14/2011    Social History   Tobacco Use  . Smoking status: Never Smoker  . Smokeless tobacco: Never Used  Substance Use Topics  . Alcohol use: No  . Drug use: No   Review of Systems Review of Systems  Constitutional: + weight gain; Negative for fever, chills, diaphoresis, activity change, appetite change, fatigue and unexpected weight change.  HENT: Negative for congestion, sore throat, rhinorrhea, sneezing, trouble swallowing and sinus pressure.  Eyes: Negative for photophobia and visual disturbance.  Respiratory: Negative for cough, chest tightness, shortness of breath, wheezing and stridor.  Cardiovascular: Negative for chest pain, palpitations and leg swelling.  Gastrointestinal: Negative for nausea, vomiting, abdominal pain, diarrhea, constipation, blood in stool, abdominal distention and anal bleeding.  Genitourinary: Negative for dysuria,  hematuria, flank pain and difficulty urinating.  Musculoskeletal: Negative for myalgias, back pain, joint swelling, arthralgias and gait problem.  Skin: Negative for color change, pallor, rash and wound.  Neurological: Negative for dizziness, tremors, weakness and light-headedness.  Hematological: Negative for adenopathy. Does not bruise/bleed easily.  Psychiatric/Behavioral: Negative for behavioral problems, confusion, sleep disturbance, dysphoric mood, decreased concentration and agitation.    Physical Exam   There were no vitals taken for this visit. Physical Exam  Constitutional:  oriented to person, place, and time. appears well-developed and well-nourished. No distress.  HENT: [place]/AT, PERRLA, no scleral icterus Mouth/Throat: Oropharynx is clear and moist. No oropharyngeal exudate.  Cardiovascular: Normal rate, regular rhythm and normal heart sounds. Exam reveals no gallop and no friction rub.  No murmur heard.  Pulmonary/Chest: Effort normal and breath sounds normal. No respiratory distress.  has no wheezes.  Neck = supple, no nuchal rigidity Abdominal: Soft. Bowel sounds are normal.  exhibits no distension. There is no tenderness.  Lymphadenopathy: no cervical adenopathy. No axillary adenopathy Neurological: alert and oriented to person, place, and time.  Skin: Skin is warm and dry. No rash noted. No erythema.  Psychiatric: a normal mood and affect.  behavior is normal.   Lab Results  Component Value Date   CD4TCELL 40 03/13/2019   Lab Results  Component Value Date   CD4TABS 422 03/13/2019   CD4TABS 370 (L) 08/19/2018   CD4TABS 480 04/21/2018   Lab Results  Component Value Date   HIV1RNAQUANT <20 DETECTED (A) 03/13/2019   Lab Results  Component Value Date   HEPBSAB NO 12/27/2006   Lab Results  Component Value Date   LABRPR NON-REACTIVE 03/13/2019    CBC Lab Results  Component Value Date   WBC 3.4 (L) 03/13/2019   RBC 4.54 03/13/2019   HGB 13.0 03/13/2019    HCT 38.8 03/13/2019   PLT 236 03/13/2019   MCV 85.5 03/13/2019   MCH 28.6 03/13/2019   MCHC 33.5 03/13/2019   RDW 14.9 03/13/2019   LYMPHSABS 1,200 03/13/2019   MONOABS 150 (L) 06/10/2017   EOSABS 61 03/13/2019    BMET Lab Results  Component Value Date   NA 140 03/13/2019   K 4.2 03/13/2019   CL 108 03/13/2019   CO2 27 03/13/2019   GLUCOSE 100 (H) 03/13/2019   BUN 19 03/13/2019   CREATININE 0.82 03/13/2019   CALCIUM 9.2 03/13/2019   GFRNONAA 77 03/13/2019   GFRAA 89 03/13/2019      Assessment and Plan Weight gain = - continuing diet and increased activity, intermittent fasting   hiv disease=  Well controlled. Continue on current regimen  Long term medication management = cr is stable. No need to adjust medications  49months rtc

## 2019-05-18 ENCOUNTER — Ambulatory Visit: Payer: Self-pay

## 2019-05-18 ENCOUNTER — Other Ambulatory Visit: Payer: Self-pay

## 2019-05-24 ENCOUNTER — Other Ambulatory Visit: Payer: Self-pay | Admitting: Internal Medicine

## 2019-05-24 ENCOUNTER — Encounter: Payer: Self-pay | Admitting: Internal Medicine

## 2019-05-24 DIAGNOSIS — B20 Human immunodeficiency virus [HIV] disease: Secondary | ICD-10-CM

## 2019-07-05 ENCOUNTER — Other Ambulatory Visit: Payer: Self-pay

## 2019-07-05 ENCOUNTER — Encounter: Payer: Self-pay | Admitting: Internal Medicine

## 2019-07-05 ENCOUNTER — Ambulatory Visit (INDEPENDENT_AMBULATORY_CARE_PROVIDER_SITE_OTHER): Payer: Self-pay | Admitting: Internal Medicine

## 2019-07-05 VITALS — BP 150/89 | HR 69 | Temp 97.7°F

## 2019-07-05 DIAGNOSIS — I10 Essential (primary) hypertension: Secondary | ICD-10-CM

## 2019-07-05 DIAGNOSIS — Z23 Encounter for immunization: Secondary | ICD-10-CM

## 2019-07-05 DIAGNOSIS — B20 Human immunodeficiency virus [HIV] disease: Secondary | ICD-10-CM

## 2019-07-05 NOTE — Progress Notes (Signed)
RFV; follow up for hiv disease  Patient ID: Victoria Coffey, female   DOB: [birth year], 63 y.o.   MRN: [id number]  HPI Kikuyo is a 63yo F with hiv disease, currently on odefsey, CD4 count of 522/VL<20 ( may 2020)  Doing well with adherence. Concern about weight gain. Spoke most of the visit about situation depression since covid Outpatient Encounter Medications as of 07/05/2019  Medication Sig  . hydrochlorothiazide (MICROZIDE) 12.5 MG capsule TAKE 1 CAPSULE (12.5 MG TOTAL) BY MOUTH DAILY.  Marland Kitchen ODEFSEY 200-25-25 MG TABS tablet TAKE 1 TABLET BY MOUTH DAILY WITH REAKFAST   No facility-administered encounter medications on file as of 07/05/2019.      Patient Active Problem List   Diagnosis Date Noted  . Rash and nonspecific skin eruption 11/08/2013  . History of breast cancer 12/20/2012  . HYPERTENSION, BENIGN ESSENTIAL 08/11/2007  . HIV DISEASE 04/14/2007     Health Maintenance Due  Topic Date Due  . TETANUS/TDAP  07/22/1975  . MAMMOGRAM  07/21/2006  . COLONOSCOPY  07/21/2006  . PAP SMEAR-Modifier  11/14/2011  . INFLUENZA VACCINE  06/03/2019    Social History   Tobacco Use  . Smoking status: Never Smoker  . Smokeless tobacco: Never Used  Substance Use Topics  . Alcohol use: No  . Drug use: No    Review of Systems 12 pont ros is negative except what is mentioned above Physical Exam   BP (!) 150/89   Pulse 69   Temp 97.7 F (36.5 C) (Oral)  Physical Exam  Constitutional:  oriented to person, place, and time. appears well-developed and well-nourished. No distress.  HENT: [place]/AT, PERRLA, no scleral icterus Mouth/Throat: Oropharynx is clear and moist. No oropharyngeal exudate.  Cardiovascular: Normal rate, regular rhythm and normal heart sounds. Exam reveals no gallop and no friction rub.  No murmur heard.  Pulmonary/Chest: Effort normal and breath sounds normal. No respiratory distress.  has no wheezes.  Neck = supple, no nuchal rigidity Abdominal: Soft. Bowel sounds are  normal.  exhibits no distension. There is no tenderness.  Lymphadenopathy: no cervical adenopathy. No axillary adenopathy Neurological: alert and oriented to person, place, and time.  Skin: Skin is warm and dry. No rash noted. No erythema.  Psychiatric: a normal mood and affect.  behavior is normal.    Lab Results  Component Value Date   CD4TCELL 40 03/13/2019   Lab Results  Component Value Date   CD4TABS 422 03/13/2019   CD4TABS 370 (L) 08/19/2018   CD4TABS 480 04/21/2018   Lab Results  Component Value Date   HIV1RNAQUANT <20 DETECTED (A) 03/13/2019   Lab Results  Component Value Date   HEPBSAB NO 12/27/2006   Lab Results  Component Value Date   LABRPR NON-REACTIVE 03/13/2019    CBC Lab Results  Component Value Date   WBC 3.4 (L) 03/13/2019   RBC 4.54 03/13/2019   HGB 13.0 03/13/2019   HCT 38.8 03/13/2019   PLT 236 03/13/2019   MCV 85.5 03/13/2019   MCH 28.6 03/13/2019   MCHC 33.5 03/13/2019   RDW 14.9 03/13/2019   LYMPHSABS 1,200 03/13/2019   MONOABS 150 (L) 06/10/2017   EOSABS 61 03/13/2019    BMET Lab Results  Component Value Date   NA 140 03/13/2019   K 4.2 03/13/2019   CL 108 03/13/2019   CO2 27 03/13/2019   GLUCOSE 100 (H) 03/13/2019   BUN 19 03/13/2019   CREATININE 0.82 03/13/2019   CALCIUM 9.2 03/13/2019   GFRNONAA  77 03/13/2019   GFRAA 89 03/13/2019      Assessment and Plan  Depression = engaging more with family(therapist in her family) getting back to her old routines, exercise  hiv = will check labs  htn = will recheck and have her check at home, she is to call back in 2 wk. If still high, will increase to hctz 25 daily  Health maintenance = will get flu shot today, and to call dr bertrand's office for [facility] [place]

## 2019-07-06 LAB — T-HELPER CELL (CD4) - (RCID CLINIC ONLY)
CD4 % Helper T Cell: 41 % (ref 33–65)
CD4 T Cell Abs: 520 /uL (ref 400–1790)

## 2019-07-08 LAB — CBC WITH DIFFERENTIAL/PLATELET
Absolute Monocytes: 267 cells/uL (ref 200–950)
Basophils Absolute: 21 cells/uL (ref 0–200)
Basophils Relative: 0.5 %
Eosinophils Absolute: 78 cells/uL (ref 15–500)
Eosinophils Relative: 1.9 %
HCT: 41.6 % (ref 35.0–45.0)
Hemoglobin: 13.4 g/dL (ref 11.7–15.5)
Lymphs Abs: 1529 cells/uL (ref 850–3900)
MCH: 27.3 pg (ref 27.0–33.0)
MCHC: 32.2 g/dL (ref 32.0–36.0)
MCV: 84.9 fL (ref 80.0–100.0)
MPV: 11.4 fL (ref 7.5–12.5)
Monocytes Relative: 6.5 %
Neutro Abs: 2206 cells/uL (ref 1500–7800)
Neutrophils Relative %: 53.8 %
Platelets: 243 10*3/uL (ref 140–400)
RBC: 4.9 10*6/uL (ref 3.80–5.10)
RDW: 14.2 % (ref 11.0–15.0)
Total Lymphocyte: 37.3 %
WBC: 4.1 10*3/uL (ref 3.8–10.8)

## 2019-07-08 LAB — COMPLETE METABOLIC PANEL WITH GFR
AG Ratio: 1.3 (calc) (ref 1.0–2.5)
ALT: 16 U/L (ref 6–29)
AST: 19 U/L (ref 10–35)
Albumin: 4.3 g/dL (ref 3.6–5.1)
Alkaline phosphatase (APISO): 149 U/L (ref 37–153)
BUN: 20 mg/dL (ref 7–25)
CO2: 29 mmol/L (ref 20–32)
Calcium: 9.7 mg/dL (ref 8.6–10.4)
Chloride: 107 mmol/L (ref 98–110)
Creat: 0.84 mg/dL (ref 0.50–0.99)
GFR, Est African American: 86 mL/min/{1.73_m2} (ref >=60)
GFR, Est Non African American: 74 mL/min/{1.73_m2} (ref >=60)
Globulin: 3.2 g/dL (calc) (ref 1.9–3.7)
Glucose, Bld: 102 mg/dL — ABNORMAL HIGH (ref 65–99)
Potassium: 4.2 mmol/L (ref 3.5–5.3)
Sodium: 141 mmol/L (ref 135–146)
Total Bilirubin: 0.3 mg/dL (ref 0.2–1.2)
Total Protein: 7.5 g/dL (ref 6.1–8.1)

## 2019-07-08 LAB — HIV-1 RNA QUANT-NO REFLEX-BLD
HIV 1 RNA Quant: <20 copies/mL — AB
HIV-1 RNA Quant, Log: <1.3 Log copies/mL — AB

## 2019-10-14 ENCOUNTER — Other Ambulatory Visit: Payer: Self-pay | Admitting: Internal Medicine

## 2019-10-14 DIAGNOSIS — B20 Human immunodeficiency virus [HIV] disease: Secondary | ICD-10-CM

## 2019-11-06 ENCOUNTER — Ambulatory Visit (INDEPENDENT_AMBULATORY_CARE_PROVIDER_SITE_OTHER): Payer: Self-pay | Admitting: Internal Medicine

## 2019-11-06 ENCOUNTER — Encounter: Payer: Self-pay | Admitting: Internal Medicine

## 2019-11-06 ENCOUNTER — Other Ambulatory Visit: Payer: Self-pay

## 2019-11-06 VITALS — BP 169/98 | HR 89 | Temp 98.3°F | Wt 167.0 lb

## 2019-11-06 DIAGNOSIS — R635 Abnormal weight gain: Secondary | ICD-10-CM

## 2019-11-06 DIAGNOSIS — I10 Essential (primary) hypertension: Secondary | ICD-10-CM

## 2019-11-06 DIAGNOSIS — B20 Human immunodeficiency virus [HIV] disease: Secondary | ICD-10-CM

## 2019-11-06 MED ORDER — HYDROCHLOROTHIAZIDE 25 MG PO TABS: 25.0000 mg | ORAL_TABLET | Freq: Every day | ORAL | 11 refills | Status: DC

## 2019-11-06 MED ORDER — ODEFSEY 200-25-25 MG PO TABS: 1.0000 | ORAL_TABLET | Freq: Every day | ORAL | 11 refills | Status: AC

## 2019-11-06 NOTE — Progress Notes (Signed)
RFV: follow up for hiv disease  Patient ID: Victoria Coffey, female   DOB: [birth year], 64 y.o.   MRN: [id number]  HPI Victoria Coffey is a 64yo F with well controlled hiv disease, CD 4 count of 520/VL<20 (sep 2020) on odefsey. Doing well. She noticed some weight gain over these last few months.  No covid exposure. Quiet holidays  Outpatient Encounter Medications as of 11/06/2019  Medication Sig  . hydrochlorothiazide (MICROZIDE) 12.5 MG capsule TAKE 1 CAPSULE (12.5 MG TOTAL) BY MOUTH DAILY.  Marland Kitchen ODEFSEY 200-25-25 MG TABS tablet TAKE 1 TABLET BY MOUTH DAILY WITH REAKFAST   No facility-administered encounter medications on file as of 11/06/2019.     Patient Active Problem List   Diagnosis Date Noted  . Rash and nonspecific skin eruption 11/08/2013  . History of breast cancer 12/20/2012  . HYPERTENSION, BENIGN ESSENTIAL 08/11/2007  . HIV DISEASE 04/14/2007     Health Maintenance Due  Topic Date Due  . TETANUS/TDAP  07/22/1975  . MAMMOGRAM  07/21/2006  . COLONOSCOPY  07/21/2006  . PAP SMEAR-Modifier  11/14/2011    Social History   Tobacco Use  . Smoking status: Never Smoker  . Smokeless tobacco: Never Used  Substance Use Topics  . Alcohol use: No  . Drug use: No   Review of Systems Review of Systems  Constitutional: Negative for fever, chills, diaphoresis, activity change, appetite change, fatigue and + weight gain.  HENT: Negative for congestion, sore throat, rhinorrhea, sneezing, trouble swallowing and sinus pressure.  Eyes: Negative for photophobia and visual disturbance.  Respiratory: Negative for cough, chest tightness, shortness of breath, wheezing and stridor.  Cardiovascular: Negative for chest pain, palpitations and leg swelling.  Gastrointestinal: Negative for nausea, vomiting, abdominal pain, diarrhea, constipation, blood in stool, abdominal distention and anal bleeding.  Genitourinary: Negative for dysuria, hematuria, flank pain and difficulty urinating.    Musculoskeletal: Negative for myalgias, back pain, joint swelling, arthralgias and gait problem.  Skin: Negative for color change, pallor, rash and wound.  Neurological: Negative for dizziness, tremors, weakness and light-headedness.  Hematological: Negative for adenopathy. Does not bruise/bleed easily.  Psychiatric/Behavioral: Negative for behavioral problems, confusion, sleep disturbance, dysphoric mood, decreased concentration and agitation.    Physical Exam   BP (!) 174/102   Pulse 91   Temp 98.3 F (36.8 C) (Oral)   Wt 167 lb (75.8 kg)   BMI 31.55 kg/m   Physical Exam  Constitutional:  oriented to person, place, and time. appears well-developed and well-nourished. No distress.  HENT: [place]/AT, PERRLA, no scleral icterus Mouth/Throat: Oropharynx is clear and moist. No oropharyngeal exudate.  Cardiovascular: Normal rate, regular rhythm and normal heart sounds. Exam reveals no gallop and no friction rub.  No murmur heard.  Pulmonary/Chest: Effort normal and breath sounds normal. No respiratory distress.  has no wheezes.  Neck = supple, no nuchal rigidity Abdominal: Soft. Bowel sounds are normal.  exhibits no distension. There is no tenderness.  Lymphadenopathy: no cervical adenopathy. No axillary adenopathy Neurological: alert and oriented to person, place, and time.  Skin: Skin is warm and dry. No rash noted. No erythema.  Psychiatric: a normal mood and affect.  behavior is normal.   Lab Results  Component Value Date   CD4TCELL 41 07/05/2019   Lab Results  Component Value Date   CD4TABS 520 07/05/2019   CD4TABS 422 03/13/2019   CD4TABS 370 (L) 08/19/2018   Lab Results  Component Value Date   HIV1RNAQUANT <20 DETECTED (A) 07/05/2019   Lab Results  Component Value Date   HEPBSAB NO 12/27/2006   Lab Results  Component Value Date   LABRPR NON-REACTIVE 03/13/2019    CBC Lab Results  Component Value Date   WBC 4.1 07/05/2019   RBC 4.90 07/05/2019   HGB 13.4  07/05/2019   HCT 41.6 07/05/2019   PLT 243 07/05/2019   MCV 84.9 07/05/2019   MCH 27.3 07/05/2019   MCHC 32.2 07/05/2019   RDW 14.2 07/05/2019   LYMPHSABS 1,529 07/05/2019   MONOABS 150 (L) 06/10/2017   EOSABS 78 07/05/2019    BMET Lab Results  Component Value Date   NA 141 07/05/2019   K 4.2 07/05/2019   CL 107 07/05/2019   CO2 29 07/05/2019   GLUCOSE 102 (H) 07/05/2019   BUN 20 07/05/2019   CREATININE 0.84 07/05/2019   CALCIUM 9.7 07/05/2019   GFRNONAA 74 07/05/2019   GFRAA 86 07/05/2019      Assessment and Plan  hiv disease = continue to take odesfey, we will get hiv labs today  htn = start on hctz 56m dialy  Weight gain = discussed increasing exercise and consider intermittent fasting

## 2019-11-07 LAB — T-HELPER CELL (CD4) - (RCID CLINIC ONLY)
CD4 % Helper T Cell: 40 % (ref 33–65)
CD4 T Cell Abs: 532 /uL (ref 400–1790)

## 2019-11-10 LAB — CBC WITH DIFFERENTIAL/PLATELET
Absolute Monocytes: 258 cells/uL (ref 200–950)
Basophils Absolute: 21 cells/uL (ref 0–200)
Basophils Relative: 0.5 %
Eosinophils Absolute: 148 cells/uL (ref 15–500)
Eosinophils Relative: 3.6 %
HCT: 40.5 % (ref 35.0–45.0)
Hemoglobin: 13.2 g/dL (ref 11.7–15.5)
Lymphs Abs: 1538 cells/uL (ref 850–3900)
MCH: 27.7 pg (ref 27.0–33.0)
MCHC: 32.6 g/dL (ref 32.0–36.0)
MCV: 84.9 fL (ref 80.0–100.0)
MPV: 11.4 fL (ref 7.5–12.5)
Monocytes Relative: 6.3 %
Neutro Abs: 2136 cells/uL (ref 1500–7800)
Neutrophils Relative %: 52.1 %
Platelets: 238 10*3/uL (ref 140–400)
RBC: 4.77 10*6/uL (ref 3.80–5.10)
RDW: 13.9 % (ref 11.0–15.0)
Total Lymphocyte: 37.5 %
WBC: 4.1 10*3/uL (ref 3.8–10.8)

## 2019-11-10 LAB — COMPLETE METABOLIC PANEL WITH GFR
AG Ratio: 1.2 (calc) (ref 1.0–2.5)
ALT: 17 U/L (ref 6–29)
AST: 17 U/L (ref 10–35)
Albumin: 4.1 g/dL (ref 3.6–5.1)
Alkaline phosphatase (APISO): 136 U/L (ref 37–153)
BUN: 15 mg/dL (ref 7–25)
CO2: 26 mmol/L (ref 20–32)
Calcium: 9.8 mg/dL (ref 8.6–10.4)
Chloride: 104 mmol/L (ref 98–110)
Creat: 0.84 mg/dL (ref 0.50–0.99)
GFR, Est African American: 86 mL/min/{1.73_m2} (ref >=60)
GFR, Est Non African American: 74 mL/min/{1.73_m2} (ref >=60)
Globulin: 3.3 g/dL (calc) (ref 1.9–3.7)
Glucose, Bld: 108 mg/dL — ABNORMAL HIGH (ref 65–99)
Potassium: 4 mmol/L (ref 3.5–5.3)
Sodium: 139 mmol/L (ref 135–146)
Total Bilirubin: 0.3 mg/dL (ref 0.2–1.2)
Total Protein: 7.4 g/dL (ref 6.1–8.1)

## 2019-11-10 LAB — HIV-1 RNA QUANT-NO REFLEX-BLD
HIV 1 RNA Quant: 21 copies/mL — ABNORMAL HIGH
HIV-1 RNA Quant, Log: 1.32 Log copies/mL — ABNORMAL HIGH

## 2020-02-08 ENCOUNTER — Other Ambulatory Visit: Payer: Self-pay | Admitting: *Deleted

## 2020-02-08 DIAGNOSIS — B20 Human immunodeficiency virus [HIV] disease: Secondary | ICD-10-CM

## 2020-02-09 ENCOUNTER — Other Ambulatory Visit: Payer: Self-pay

## 2020-02-09 ENCOUNTER — Telehealth: Payer: Self-pay

## 2020-02-09 DIAGNOSIS — Z0000 Encounter for general adult medical examination without abnormal findings: Secondary | ICD-10-CM

## 2020-02-09 DIAGNOSIS — Z1231 Encounter for screening mammogram for malignant neoplasm of breast: Secondary | ICD-10-CM

## 2020-02-09 DIAGNOSIS — B20 Human immunodeficiency virus [HIV] disease: Secondary | ICD-10-CM

## 2020-02-09 LAB — T-HELPER CELL (CD4) - (RCID CLINIC ONLY)
CD4 % Helper T Cell: 37 % (ref 33–65)
CD4 T Cell Abs: 617 /uL (ref 400–1790)

## 2020-02-09 NOTE — Telephone Encounter (Signed)
Patient in office for lab work. Per patient, after speaking with [facility] mammography she needs a referral from provider to be seen for mammo. Most recent imaging from 2016 stated routine screening is appropriate. Orders placed for mammo, awaiting provider cosign.   Abbas Beyene Lorita Officer, RN

## 2020-02-11 LAB — CBC WITH DIFFERENTIAL/PLATELET
Absolute Monocytes: 369 cells/uL (ref 200–950)
Basophils Absolute: 18 cells/uL (ref 0–200)
Basophils Relative: 0.4 %
Eosinophils Absolute: 108 cells/uL (ref 15–500)
Eosinophils Relative: 2.4 %
HCT: 41.1 % (ref 35.0–45.0)
Hemoglobin: 13.6 g/dL (ref 11.7–15.5)
Lymphs Abs: 1692 cells/uL (ref 850–3900)
MCH: 28 pg (ref 27.0–33.0)
MCHC: 33.1 g/dL (ref 32.0–36.0)
MCV: 84.6 fL (ref 80.0–100.0)
MPV: 11 fL (ref 7.5–12.5)
Monocytes Relative: 8.2 %
Neutro Abs: 2313 cells/uL (ref 1500–7800)
Neutrophils Relative %: 51.4 %
Platelets: 298 10*3/uL (ref 140–400)
RBC: 4.86 10*6/uL (ref 3.80–5.10)
RDW: 14.1 % (ref 11.0–15.0)
Total Lymphocyte: 37.6 %
WBC: 4.5 10*3/uL (ref 3.8–10.8)

## 2020-02-11 LAB — COMPLETE METABOLIC PANEL WITH GFR
AG Ratio: 1.2 (calc) (ref 1.0–2.5)
ALT: 64 U/L — ABNORMAL HIGH (ref 6–29)
AST: 40 U/L — ABNORMAL HIGH (ref 10–35)
Albumin: 4.1 g/dL (ref 3.6–5.1)
Alkaline phosphatase (APISO): 159 U/L — ABNORMAL HIGH (ref 37–153)
BUN: 14 mg/dL (ref 7–25)
CO2: 26 mmol/L (ref 20–32)
Calcium: 9.5 mg/dL (ref 8.6–10.4)
Chloride: 104 mmol/L (ref 98–110)
Creat: 0.82 mg/dL (ref 0.50–0.99)
GFR, Est African American: 88 mL/min/{1.73_m2} (ref >=60)
GFR, Est Non African American: 76 mL/min/{1.73_m2} (ref >=60)
Globulin: 3.3 g/dL (calc) (ref 1.9–3.7)
Glucose, Bld: 105 mg/dL — ABNORMAL HIGH (ref 65–99)
Potassium: 3.8 mmol/L (ref 3.5–5.3)
Sodium: 140 mmol/L (ref 135–146)
Total Bilirubin: 0.2 mg/dL (ref 0.2–1.2)
Total Protein: 7.4 g/dL (ref 6.1–8.1)

## 2020-02-11 LAB — HIV-1 RNA QUANT-NO REFLEX-BLD
HIV 1 RNA Quant: 69 copies/mL — ABNORMAL HIGH
HIV-1 RNA Quant, Log: 1.84 Log copies/mL — ABNORMAL HIGH

## 2020-02-15 NOTE — Telephone Encounter (Signed)
Patient called office to follow up on referral to [facility]. States she noticed hard lump on left breast. Would like to have this looked at since she is a cancer survivor. Will forward message to MD. Victoria Coffey, [place]

## 2020-02-20 ENCOUNTER — Other Ambulatory Visit: Payer: Self-pay

## 2020-02-20 ENCOUNTER — Other Ambulatory Visit: Payer: Self-pay | Admitting: Family

## 2020-02-20 DIAGNOSIS — Z1231 Encounter for screening mammogram for malignant neoplasm of breast: Secondary | ICD-10-CM

## 2020-02-20 NOTE — Telephone Encounter (Signed)
Faxed referral slip for Mammogram Digital Diagnostic Bilateral   DX N63.0 written on Prescription pad by Terri Piedra NP to [facility] at [phone] to schedule Delcie Roch a Mammogram

## 2020-02-26 ENCOUNTER — Encounter: Payer: Self-pay | Admitting: Internal Medicine

## 2020-02-26 ENCOUNTER — Telehealth: Payer: Self-pay | Admitting: *Deleted

## 2020-02-26 ENCOUNTER — Ambulatory Visit (INDEPENDENT_AMBULATORY_CARE_PROVIDER_SITE_OTHER): Payer: Self-pay | Admitting: Internal Medicine

## 2020-02-26 ENCOUNTER — Other Ambulatory Visit: Payer: Self-pay

## 2020-02-26 VITALS — Wt 169.0 lb

## 2020-02-26 DIAGNOSIS — N6320 Unspecified lump in the left breast, unspecified quadrant: Secondary | ICD-10-CM

## 2020-02-26 DIAGNOSIS — Z853 Personal history of malignant neoplasm of breast: Secondary | ICD-10-CM

## 2020-02-26 DIAGNOSIS — B20 Human immunodeficiency virus [HIV] disease: Secondary | ICD-10-CM

## 2020-02-26 DIAGNOSIS — Z79899 Other long term (current) drug therapy: Secondary | ICD-10-CM

## 2020-02-26 NOTE — Telephone Encounter (Signed)
Patient is uninsured with a history of breast cancer and new hardening/pain in her left breast. Per Dr Baxter Flattery, RN called to get patient seen as soon as possible for evaluation. Orders entered. Per Judson Roch at [facility], patient 1) needs to be screened by [facility] program first, 2) needs all records couriered from [facility] for comparison.  RN spoke with Nevin Bloodgood at [place]. She will screen patient over the phone now and will schedule patient for [place] clinic appointment 4/27 at 8:30. [facility] will then [facility]. RN left message with local [facility] asking for a call back to request the records be couriered. [phone]. [place], [place] spoke with [facility] to see about scheduling patient with them.  [facility]S would prefer to get the referral directly from [facility].

## 2020-02-26 NOTE — Progress Notes (Signed)
RFV: follow up for hiv disease  Patient ID: Victoria Coffey, female   DOB: 1956/02/07, 64 y.o.   MRN: [id number]  HPI Victoria Coffey is a 64yo F with hiv disease and htn, [name] she started to notice breast asymmetry roughly 6-7 weeks ago then her left breast she has noticed firm, discoloration, decreasing in size due to density over the last 6 weeks ago. No drainage from nipple. Firm, shrunken tissue - now her left breast is roughly 1/4 size of right breast. Sharp pains -for which she takes 400mg  twice a day  She did not get her mammogram last year due to covid  Hx of breast ca in late 1900s where she had lumpectomy, left breast- had been on tamoxifen Dr Isaiah Blakes -  May 12th - at [place] mammography  Outpatient Encounter Medications as of 02/26/2020  Medication Sig  . emtricitabine-rilpivir-tenofovir AF (ODEFSEY) 200-25-25 MG TABS tablet Take 1 tablet by mouth daily with breakfast.  . hydrochlorothiazide (HYDRODIURIL) 25 MG tablet Take 1 tablet (25 mg total) by mouth daily.   No facility-administered encounter medications on file as of 02/26/2020.     Patient Active Problem List   Diagnosis Date Noted  . Rash and nonspecific skin eruption 11/08/2013  . History of breast cancer 12/20/2012  . HYPERTENSION, BENIGN ESSENTIAL 08/11/2007  . HIV DISEASE 04/14/2007     Health Maintenance Due  Topic Date Due  . COVID-19 Vaccine (1) Never done  . TETANUS/TDAP  Never done  . MAMMOGRAM  Never done  . COLONOSCOPY  Never done  . PAP SMEAR-Modifier  11/14/2011     Review of Systems 12 point ros is negative except what is mentioned above Physical Exam   Wt 169 lb (76.7 kg)   BMI 31.93 kg/m    Physical Exam  Constitutional:  oriented to person, place, and time. appears well-developed and well-nourished. No distress.  HENT: [place]/AT, PERRLA, no scleral icterus Mouth/Throat: Oropharynx is clear and moist. No oropharyngeal exudate.  Cardiovascular: Normal rate, regular  rhythm and normal heart sounds. Exam reveals no gallop and no friction rub.  No murmur heard.  Pulmonary/Chest: Effort normal and breath sounds normal. No respiratory distress.  has no wheezes.  Neck = supple, no nuchal rigidity Abdominal: Soft. Bowel sounds are normal.  exhibits no distension. There is no tenderness.  Lymphadenopathy: no cervical adenopathy. No axillary adenopathy Neurological: alert and oriented to person, place, and time.  Skin: Left breast is roughly 1/4th of size of right breast. Dense, hyper pigmentation, dryness to aereola, no drainage, + LN to axilla ? Peau d'orange changes noted Psychiatric: a normal mood and affect.  behavior is normal.   Lab Results  Component Value Date   CD4TCELL 37 02/09/2020   Lab Results  Component Value Date   CD4TABS 617 02/09/2020   CD4TABS 532 11/06/2019   CD4TABS 520 07/05/2019   Lab Results  Component Value Date   HIV1RNAQUANT 69 (H) 02/09/2020   Lab Results  Component Value Date   HEPBSAB NO 12/27/2006   Lab Results  Component Value Date   LABRPR NON-REACTIVE 03/13/2019    CBC Lab Results  Component Value Date   WBC 4.5 02/09/2020   RBC 4.86 02/09/2020   HGB 13.6 02/09/2020   HCT 41.1 02/09/2020   PLT 298 02/09/2020   MCV 84.6 02/09/2020   MCH 28.0 02/09/2020   MCHC 33.1 02/09/2020   RDW 14.1 02/09/2020   LYMPHSABS 1,692 02/09/2020   MONOABS 150 (  L) 06/10/2017   EOSABS 108 02/09/2020    BMET Lab Results  Component Value Date   NA 140 02/09/2020   K 3.8 02/09/2020   CL 104 02/09/2020   CO2 26 02/09/2020   GLUCOSE 105 (H) 02/09/2020   BUN 14 02/09/2020   CREATININE 0.82 02/09/2020   CALCIUM 9.5 02/09/2020   GFRNONAA 76 02/09/2020   GFRAA 88 02/09/2020      Assessment and Plan  Breast mass/changes = concern for breast cancer recurrence. Patient does not have health insurance at the time being but will be in effect next month. Will get scholarship for mammo diagnostics plus u/s  Then breast  center to refer to [place] [facility]  hiv disease = - wonder if she is having breakthrough, low level viremia at 69 copies. Will recheck VL in 1 month  Spent 60 min with patient in attempts to coordinate care for work up on breast changes in getting imaging in the setting of lack of insurance. Also discussed what work up would consist of for her.

## 2020-02-26 NOTE — Progress Notes (Signed)
Per MD called [facility] to move appointment to earlier day/time. Scheduling was able to move appointment to 4/28 at 7:45 A:m.  Aundria Rud, [place]

## 2020-02-27 ENCOUNTER — Other Ambulatory Visit: Payer: Self-pay

## 2020-02-27 ENCOUNTER — Encounter: Payer: Self-pay | Admitting: Women's Health

## 2020-02-27 ENCOUNTER — Telehealth: Payer: Self-pay

## 2020-02-27 ENCOUNTER — Ambulatory Visit: Payer: Self-pay | Admitting: Women's Health

## 2020-02-27 VITALS — BP 146/90 | Temp 98.2°F | Wt 167.0 lb

## 2020-02-27 DIAGNOSIS — Z1239 Encounter for other screening for malignant neoplasm of breast: Secondary | ICD-10-CM

## 2020-02-27 NOTE — Telephone Encounter (Signed)
(  took place @ 9:25 am) Patient came to her appointment with [facility] today, per Dr. Crissie Figures request for assistance with mammogram coverage. Patient has a history of left breast cancer, and is having discomfort, pain, hardness, decrease in size of left breast. Patient was initially scheduled to go to [facility] per Dr. Graylon Good, but was not able to be seen until 03/04/2020. I contacted Cherish @ the [facility], who states there were no immediate openings for a diagnostic today, but would call if there was cancellation. Patient then stated she had been to [facility] in the past. I contacted [facility], per [facility], patient was scheduled for diagnostic mammogram on 02/28/2020 @ 7:45 am. Patient then received a call while in office, was told per the [facility], can reschedule for 02/29/2020. I verified with [facility] @ Dr. Crissie Figures office that since patient was already going to [facility] for the last 6 years, should go to [place]. Luis verified with the RN, and was told it was okay to go to [place]. Patient elected to go to [facility], cancelled the [facility] appointment.

## 2020-02-27 NOTE — Telephone Encounter (Signed)
Received call from [facility] with the [facility] program regarding plan of care for patient. Would like to know if patient should be seen by [facility] or [facility] for [facility]. Per Md patient needs to be seen ASAP. Will see [facility] since she has appt tomorrow; Breast center cannot see until 5/3. [place]

## 2020-02-27 NOTE — Patient Instructions (Signed)
Breast Self-Awareness Breast self-awareness means being familiar with how your breasts look and feel. It involves checking your breasts regularly and reporting any changes to your health care provider. Practicing breast self-awareness is important. Sometimes changes may not be harmful (are benign), but sometimes a change in your breasts can be a sign of a serious medical problem. It is important to learn how to do this procedure correctly so that you can catch problems early, when treatment is more likely to be successful. All women should practice breast self-awareness, including women who have had breast implants. What you need:  A mirror.  A well-lit room. How to do a breast self-exam A breast self-exam is one way to learn what is normal for your breasts and whether your breasts are changing. To do a breast self-exam: Look for changes  1. Remove all the clothing above your waist. 2. Stand in front of a mirror in a room with good lighting. 3. Put your hands on your hips. 4. Push your hands firmly downward. 5. Compare your breasts in the mirror. Look for differences between them (asymmetry), such as: ? Differences in shape. ? Differences in size. ? Puckers, dips, and bumps in one breast and not the other. 6. Look at each breast for changes in the skin, such as: ? Redness. ? Scaly areas. 7. Look for changes in your nipples, such as: ? Discharge. ? Bleeding. ? Dimpling. ? Redness. ? A change in position. Feel for changes Carefully feel your breasts for lumps and changes. It is best to do this while lying on your back on the floor, and again while sitting or standing in the tub or shower with soapy water on your skin. Feel each breast in the following way: 1. Place the arm on the side of the breast you are examining above your head. 2. Feel your breast with the other hand. 3. Start in the nipple area and make -inch (2 cm) overlapping circles to feel your breast. Use the pads of your  three middle fingers to do this. Apply light pressure, then medium pressure, then firm pressure. The light pressure will allow you to feel the tissue closest to the skin. The medium pressure will allow you to feel the tissue that is a little deeper. The firm pressure will allow you to feel the tissue close to the ribs. 4. Continue the overlapping circles, moving downward over the breast until you feel your ribs below your breast. 5. Move one finger-width toward the center of the body. Continue to use the -inch (2 cm) overlapping circles to feel your breast as you move slowly up toward your collarbone. 6. Continue the up-and-down exam using all three pressures until you reach your armpit.  Write down what you find Writing down what you find can help you remember what to discuss with your health care provider. Write down:  What is normal for each breast.  Any changes that you find in each breast, including: ? The kind of changes you find. ? Any pain or tenderness. ? Size and location of any lumps.  Where you are in your menstrual cycle, if you are still menstruating. General tips and recommendations  Examine your breasts every month.  If you are breastfeeding, the best time to examine your breasts is after a feeding or after using a breast pump.  If you menstruate, the best time to examine your breasts is 5-7 days after your period. Breasts are generally lumpier during menstrual periods, and it may  be more difficult to notice changes.  With time and practice, you will become more familiar with the variations in your breasts and more comfortable with the exam. Contact a health care provider if you:  See a change in the shape or size of your breasts or nipples.  See a change in the skin of your breast or nipples, such as a reddened or scaly area.  Have unusual discharge from your nipples.  Find a lump or thick area that was not there before.  Have pain in your breasts.  Have any  concerns related to your breast health. Summary  Breast self-awareness includes looking for physical changes in your breasts, as well as feeling for any changes within your breasts.  Breast self-awareness should be performed in front of a mirror in a well-lit room.  You should examine your breasts every month. If you menstruate, the best time to examine your breasts is 5-7 days after your menstrual period.  Let your health care provider know of any changes you notice in your breasts, including changes in size, changes on the skin, pain or tenderness, or unusual fluid from your nipples. This information is not intended to replace advice given to you by your health care provider. Make sure you discuss any questions you have with your health care provider. Document Revised: 06/07/2018 Document Reviewed: 06/07/2018 Elsevier Patient Education  2020 Elsevier Inc.  

## 2020-02-27 NOTE — Progress Notes (Signed)
[name] is a 64 y.o. female who presents to [facility] clinic today with complaint of hardening of entire left breast without nipple inversion but with a red discoloration. Patient denies drainage, but reports pain that she rates 8/10. Patient does endorse a history of cancer in the left breast. Patient also endorses recent growth of right breast and decrease in size in of left breast. Patient reports these symptoms started about 2-3 weeks ago.   Pap Smear: Pap not smear completed today due to patient request. Last Pap smear was 2017 at Dr. [facility] Many clinic and was normal. Per patient has no history of an abnormal Pap smear. Last Pap smear result is not available in Epic. Patient reports she has not had another Pap since that time because she was told she did not need to return for any additional testing because of her age. Patient encouraged to schedule exam with [facility] for Pap in the next couple of months.   Physical exam: Breasts Breasts asymmetrical. Left breast significantly smaller than right, entire breast is hard, immobile, reddened skin, dry, cracked nipple and areola with prominent veins on skin surface surrounding areola. There is a scar above the left breast that patient reports is from a lumpectomy in the 90s for her initial diagnosis of breast cancer. No skin abnormalities right breast. No nipple retraction bilateral breasts. No nipple discharge bilateral breasts. No lymphadenopathy. No lumps palpated bilateral breasts.       Pelvic/Bimanual Pap is not indicated today    Smoking History: Patient has never smoked not referred to quit line.    Patient Navigation: Patient education provided. Access to services provided for patient through [facility] program. No interpreter provided. No transportation provided   Colorectal Cancer Screening: Per patient has never had colonoscopy completed, but reports FIT testing. No complaints today.    Breast and Cervical Cancer Risk  Assessment: Patient does not have family history of breast cancer, known genetic mutations, or radiation treatment to the chest before age 13. Patient does not have history of cervical dysplasia, immunocompromised, or DES exposure in-utero.  Risk Assessment    Risk Scores      02/27/2020   Last edited by: Demetrius Revel, LPN   5-year risk: 1.6 %   Lifetime risk: 6.3 %          A: [facility] exam without pap smear Complaint of hardening of entire left breast without nipple inversion but with a red discoloration. Patient denies drainage, but reports pain that she rates 8/10. Patient does endorse a history of cancer in the left breast.  P: Referred patient to the [facility] for a diagnostic mammogram and ultrasound. Appointment scheduled 03/04/2020.  Clarisa Fling, NP 02/27/2020 10:23 AM

## 2020-02-29 ENCOUNTER — Telehealth: Payer: Self-pay | Admitting: *Deleted

## 2020-02-29 ENCOUNTER — Other Ambulatory Visit: Payer: Self-pay

## 2020-02-29 NOTE — Telephone Encounter (Signed)
RN called [facility] for an update. Patient is set up for biopsy on 4/30 at 9:45 at [facility] with Dr [name]. She will call patient with results by Monday. [facility] will initiate medicaid application.  [facility] will connect patient with cancer center pending results. [facility] unable to fax the report, but will scan it into her chart this morning. Landis Gandy, RN

## 2020-03-01 ENCOUNTER — Other Ambulatory Visit: Payer: Self-pay | Admitting: Radiology

## 2020-03-04 ENCOUNTER — Telehealth: Payer: Self-pay | Admitting: Hematology

## 2020-03-04 ENCOUNTER — Other Ambulatory Visit: Payer: Self-pay

## 2020-03-04 ENCOUNTER — Telehealth: Payer: Self-pay

## 2020-03-04 NOTE — Telephone Encounter (Signed)
I called to follow-up with patient regarding her breast cancer diagnosis, and to see if patient would need to apply for [facility] Medicaid. Patient declined Medicaid, states she now has BCBS coverage.

## 2020-03-04 NOTE — Telephone Encounter (Signed)
Spoke to patient to confirm afternoon [facility] appointment for 5/5, patient will pick up packet

## 2020-03-04 NOTE — Progress Notes (Signed)
[place]   Telephone:([phone]) [phone] Fax:([phone]) [place] Note   Patient Care Team: [name], MD as PCP - General (Infectious Diseases) [name], MD as PCP - Infectious Diseases (Infectious Diseases) [name], MD as Consulting Physician (General Surgery) [name], MD as Consulting Physician (Hematology) [name], MD as Consulting Physician (Radiation Oncology) [name], RN as Oncology Nurse Navigator [name], RN as Oncology Nurse Navigator  Date of Service:  03/06/2020   CHIEF COMPLAINTS/PURPOSE OF CONSULTATION:  Newly Diagnosed Malignant neoplasm of upper-outer quadrant of left breast    Oncology History  Malignant neoplasm of upper-outer quadrant of left breast in female, estrogen receptor positive ([place])  02/28/2020 Mammogram   Diagnostic Mammogram 02/28/20  IMPRESSION The 3.9 cm ill defined mass in the left breast posterior depth central 4.3cm to the nipple seen on the mediolateral oblique view onlt with associated difssue skin thickening is highly suspicious for malignancy.    03/01/2020 Cancer Staging   Staging form: Breast, AJCC 8th Edition - Clinical stage from 03/01/2020: Stage IV (cT4, cN0, pM1, G3, ER+, PR-, HER2-) - Signed by [name], MD on 03/06/2020   03/01/2020 Initial Biopsy   Diagnosis 03/01/20  1. Breast, left, needle core biopsy, 9 o'clock, 3-4cmfn - INVASIVE DUCTAL CARCINOMA. SEE NOTE 2. Lymph node, needle/core biopsy, right axilla - INVASIVE DUCTAL CARCINOMA. SEE NOTE Diagnosis Note 1. Invasive carcinoma measures 1.5 cm in greatest linear dimension and appears grade 3. Dr. [name] reviewed the case and concurs with the diagnosis. A breast prognostic profile (ER, PR, Ki-67 and HER2) is pending and will be reported in an addendum. Dr. [name] was notified on 03/04/2020. 2. Carcinoma measures 0.6 cm in greatest linear dimension and appears grade 3. Lymphoid tissue is not identified - this may  represent a completely replaced lymph node. A breast prognostic profile (ER, PR, Ki-67 and HER2) is pending and will be reported in an addendum. Dr. [name] reviewed the case and concurs with the diagnosis. Dr. [name] was notified on 03/04/2020.   03/01/2020 Receptors her2   1. PROGNOSTIC INDICATORS Results: IMMUNOHISTOCHEMICAL AND MORPHOMETRIC ANALYSIS PERFORMED MANUALLY The tumor cells are NEGATIVE for Her2 (0). Estrogen Receptor: 20%, POSITIVE, WEAK STAINING INTENSITY Progesterone Receptor: 0%, NEGATIVE Proliferation Marker Ki67: 40% COMMENT: The negative hormone receptor study(ies) in this case has an internal positive control.    2. PROGNOSTIC INDICATORS Results: IMMUNOHISTOCHEMICAL AND MORPHOMETRIC ANALYSIS PERFORMED MANUALLY The tumor cells are NEGATIVE for Her2 (1+). Estrogen Receptor: 10%, POSITIVE, WEAK STAINING INTENSITY Progesterone Receptor: 0%, NEGATIVE Proliferation Marker Ki67: 40% COMMENT: The negative hormone receptor study(ies) in this case has no internal positive control.   03/05/2020 Initial Diagnosis   Malignant neoplasm of upper-outer quadrant of left breast in female, estrogen receptor positive ([place])      HISTORY OF PRESENTING ILLNESS:  Victoria Coffey 64 y.o. female is a here because of newly diagnosed left breast cancer. The patient presents to the clinic today accompanied by her husband. I treated her mother for anemia in the past.   She had left breast cancer in 1993 treated at [facility] in [place] by Dr. Sonny Dandy and surgeon Dr Weyman Rodney. She was treated with left lumpectomy with SNLB and cancer was in the LN. She denies lymphedema. She also had weekly chemo, RT and Tamoxifen for 5 years. She has been doing yearly mammogram at [facility] but stopped after 2017 due to loss insurance. She noticed she gained weight since COVID and her breasts were  getting bigger. She notes 3 weeks ago her left breast felt different with warm to touch, smaller and harder. Her right  breast feels bigger still. She did not feel any lump of breast pain. She was seen by Dr Graylon Good who ordered mammogram. Since her biopsy she had breast pain. She denies nay hip, back pain. She has been trying to lose weight and has baseline energy. She denies abdominal or GI issues.   Socially she is married with an adult son and daughter. She is a non-smoker and does not drink. 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   They have a PMHx of HIV in 1993 and HTN. She has been seeing Dr Graylon Good for her HIV and has been under treatment since then and well controlled. She has C-section for 2 children. She has no family history of cancer. I reviewed her medication list with her. Her last period occurred when she was on Tamoxifen.    GYN HISTORY  Menarchal: 12 LMP: Stopped after taking Tamoxifen Contraceptive: 1979-1992  HRT: No G2P2 - first at 64 years old, no breast feeding    REVIEW OF SYSTEMS:    Constitutional: Denies fevers, chills or abnormal night sweats Eyes: Denies blurriness of vision, double vision or watery eyes Ears, nose, mouth, throat, and face: Denies mucositis or sore throat Respiratory: Denies cough, dyspnea or wheezes Cardiovascular: Denies palpitation, chest discomfort or lower extremity swelling Gastrointestinal:  Denies nausea, heartburn or change in bowel habits Skin: Denies abnormal skin rashes Lymphatics: Denies new lymphadenopathy or easy bruising Neurological:Denies numbness, tingling or new weaknesses Behavioral/Psych: Mood is stable, no new changes  Breast: (+) Left breast firmness, smaller and recent pain (+) right breast bigger  All other systems were reviewed with the patient and are negative.   MEDICAL HISTORY:  Past Medical History:  Diagnosis Date  . Breast cancer ([place])    left  . History of left breast cancer   . HIV infection ([place])   . Hypertension     SURGICAL HISTORY: Past Surgical History:  Procedure Laterality Date  . BREAST  LUMPECTOMY    . CESAREAN SECTION    . left breast lumpectomy  1993    SOCIAL HISTORY: Social History   Socioeconomic History  . Marital status: Married    Spouse name: Not on file  . Number of children: 2  . Years of education: Not on file  . Highest education level: Bachelor's degree (e.g., BA, AB, BS)  Occupational History  . Not on file  Tobacco Use  . Smoking status: Never Smoker  . Smokeless tobacco: Never Used  Substance and Sexual Activity  . Alcohol use: No  . Drug use: No  . Sexual activity: Not Currently  Other Topics Concern  . Not on file  Social History Narrative  . Not on file   Social Determinants of Health   Financial Resource Strain:   . Difficulty of Paying Living Expenses:   Food Insecurity:   . Worried About Charity fundraiser in the Last Year:   . Arboriculturist in the Last Year:   Transportation Needs: No Transportation Needs  . Lack of Transportation (Medical): No  . Lack of Transportation (Non-Medical): No  Physical Activity:   . Days of Exercise per Week:   . Minutes of Exercise per Session:   Stress:   . Feeling of Stress :   Social Connections:   . Frequency of Communication with Friends and Family:   . Frequency  of Social Gatherings with Friends and Family:   . Attends Religious Services:   . Active Member of Clubs or Organizations:   . Attends Archivist Meetings:   Marland Kitchen Marital Status:   Intimate Partner Violence:   . Fear of Current or Ex-Partner:   . Emotionally Abused:   Marland Kitchen Physically Abused:   . Sexually Abused:     FAMILY HISTORY: Family History  Problem Relation Age of Onset  . Hypertension Mother   . Congestive Heart Failure Mother   . Diabetes Mother   . Congestive Heart Failure Father   . Diabetes Sister   . Diabetes Brother   . Diabetes Brother     ALLERGIES:  is allergic to codeine; other; grapefruit bioflavonoid complex; pomegranate [punica]; and shellfish allergy.  MEDICATIONS:  Current  Outpatient Medications  Medication Sig Dispense Refill  . emtricitabine-rilpivir-tenofovir AF (ODEFSEY) 200-25-25 MG TABS tablet Take 1 tablet by mouth daily with breakfast. 30 tablet 11  . hydrochlorothiazide (HYDRODIURIL) 25 MG tablet Take 1 tablet (25 mg total) by mouth daily. 30 tablet 11   No current facility-administered medications for this visit.    PHYSICAL EXAMINATION: ECOG PERFORMANCE STATUS: 0 - Asymptomatic  Vitals:   03/06/20 1246  BP: (!) 149/95  Pulse: 91  Resp: 18  Temp: 99.1 F (37.3 C)  SpO2: 99%   Filed Weights   03/06/20 1246  Weight: 165 lb 11.2 oz (75.2 kg)    GENERAL:alert, no distress and comfortable SKIN: skin color, texture, turgor are normal, no rashes or significant lesions EYES: normal, Conjunctiva are pink and non-injected, sclera clear  NECK: supple, thyroid normal size, non-tender, without nodularity LYMPH:  no palpable lymphadenopathy in the cervical, axillary  LUNGS: clear to auscultation and percussion with normal breathing effort HEART: regular rate & rhythm and no murmurs and no lower extremity edema ABDOMEN:abdomen soft, non-tender and normal bowel sounds Musculoskeletal:no cyanosis of digits and no clubbing  NEURO: alert & oriented x 3 with fluent speech, no focal motor/sensory deficits BREAST: (+) Left breast much smaller, with diffuse skin discoloration and erythema and mild skin ulcer (+) Pinkish erythema of RUQ right breast with possible lump, tenderness  LABORATORY DATA:  I have reviewed the data as listed CBC Latest Ref Rng & Units 03/06/2020 02/09/2020 11/06/2019  WBC 4.0 - 10.5 K/uL 4.4 4.5 4.1  Hemoglobin 12.0 - 15.0 g/dL 13.3 13.6 13.2  Hematocrit 36.0 - 46.0 % 40.5 41.1 40.5  Platelets 150 - 400 K/uL 288 298 238    CMP Latest Ref Rng & Units 03/06/2020 02/09/2020 11/06/2019  Glucose 70 - 99 mg/dL 102(H) 105(H) 108(H)  BUN 8 - 23 mg/dL _0 Creatinine 0.44 - 1.00 mg/dL 0.87 0.82 0.84  Sodium 135 - 145 mmol/L 138 140 139    Potassium 3.5 - 5.1 mmol/L 3.4(L) 3.8 4.0  Chloride 98 - 111 mmol/L 103 104 104  CO2 22 - 32 mmol/L _1 Calcium 8.9 - 10.3 mg/dL 9.3 9.5 9.8  Total Protein 6.5 - 8.1 g/dL 8.0 7.4 7.4  Total Bilirubin 0.3 - 1.2 mg/dL 0.3 0.2 0.3  Alkaline Phos 38 - 126 U/L 213(H) - -  AST 15 - 41 U/L 46(H) 40(H) 17  ALT 0 - 44 U/L 61(H) 64(H) 17     RADIOGRAPHIC STUDIES: I have personally reviewed the radiological images as listed and agreed with the findings in the report. No results found.  ASSESSMENT & PLAN:  TREVOR DUTY is a 64 y.o.  African American female with a history of HIV (+), HTN    1. Malignant neoplasm of upper-outer quadrant of left breast, cT4N0Mx, possible stage IV with contralateral axillary node metastasis, ER weakly+, PR/HER2-, Grade III -We discussed her image findings and the biopsy results in great details. Her Mammogram showed an at least 4cm mass of left breast. Based on initial exam she probably has skin involvement and her entire left breast is occupied by cancer. Dr. [name] may do punch biopsy of skin for further evaluation. Her mammogram also showed right LN enlargement. 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 It is not definitive if her right LN are related to her left breast cancer, or if she has a second right breast cancer   -MRI breast is recommended along with CT CAP and bone scan for further evaluation and to complete staging. She is agreeable.  -If her right LN is from her left breast cancer, her cancer would be stage IV but may still be curative with multidisciplinary treatment if her left breast cancer is resectable after neoadjuvant chemotherapy. If she does have other distant metastasis, her cancer would no longer be curable, but still treatable.  -Given the inflammatory left breast cancer, upfront surgery with mastectomy is not offered at this time.  I recommend chemotherapy as first-line treatment.  If she  did not have Adriamycin for breast cancer in 1993, I would recommend Adriamycin and Cytoxan q2weeks for 4 cycles, followed by weekly carboplatin and Taxol for 12 weeks to downstage her cancer and hopefully she will become a candidate for surgical resection --She was also seen by radiation oncologist Dr. Lisbeth Renshaw today. Given her positive right LN, she would benefit from adjuvant radiation if she undergo breast surgeries.  -Given the weekly positive ER expression she still benefit from antiestrogen therapy. She is postmenopausal, so I would recommend AI or Tamoxifen.  -Given she would be on frequent chemotherapy, PAC placement is recommended. She is agreeable.  -She will also proceed with Chemo education class before she starts chemo. If she proceeds with Adriamycin which can effect her heart functions, I would obtain echo which will be repeated during treatment. -Labs reviewed, CBC and CMP WNL except K 3.4, BG 102, AST 46, ALT 61, Alk Phos 213. I recommend she increase potassium in her diet. Will evaluate her liver on CT scan.  -Her exam indicates left breast skin involvement and suspicion for right breast cancer. Her mass may have been growing over a few months.  -I reviewed the [facility] Resources available to her. I offered her seeing our SW to discussed her recent anxiety, she declined. I also strongly recommend she have COVID19 right after her scans next week.  -F/u open    2. H/o Left breast cancer, Dx in 1993 -Treated at [facility] in [place] by Dr. Sonny Dandy and surgeon Dr Weyman Rodney. She was treated with left lumpectomy with SNLB and cancer was in the LN. She denies lymphedema. She also had weekly (unknown) chemo, Adjuvant Radiation and Tamoxifen for 5 years.    3. Genetic Testing  -She has not family history of cancer but this is her second breast cancer so she is eligible for genetic testing. She is agreeable. I will send referral.  -Her husband has a high rate of breast cancer in his family. I  recommend their daughter starts breast Mammogram, and if needed MRIs, given the early age of the patient's first breast cancer.    4.  Comorbidities: HIV(+), HTN -She was diagnosed in 1993. Her HIV has been undetectable and well controlled. She is currently being seen by ID Dr Graylon Good  -She is on HCTZ for her HTN   PLAN:  -Send Genetic referral.  -MRI Breast in 1-2 weeks -PAC placement in 1-2 weeks  -CT CAP w contrast and Bone scan in 1-2 weeks  -echo in 1-2 weeks -chemo class in 1-2 weeks  -f/u after the above work up, to start her on chemo. If not other distant metastasis, I plan to offer [facility] followed by weekly carboplatin and Taxol. -I will check with pharmacy to see if we can find her chemo regiment in 1993   No orders of the defined types were placed in this encounter.   All questions were answered. The patient knows to call the clinic with any problems, questions or concerns. The total time spent in the appointment was 70 minutes.     [name], MD 03/06/2020 5:12 PM  I, Joslyn Devon, am acting as scribe for [name], MD.   I have reviewed the above documentation for accuracy and completeness, and I agree with the above.

## 2020-03-05 ENCOUNTER — Other Ambulatory Visit: Payer: Self-pay | Admitting: *Deleted

## 2020-03-05 ENCOUNTER — Telehealth: Payer: Self-pay | Admitting: *Deleted

## 2020-03-05 ENCOUNTER — Encounter: Payer: Self-pay | Admitting: *Deleted

## 2020-03-05 DIAGNOSIS — C50412 Malignant neoplasm of upper-outer quadrant of left female breast: Secondary | ICD-10-CM | POA: Insufficient documentation

## 2020-03-05 DIAGNOSIS — Z170 Estrogen receptor positive status [ER+]: Secondary | ICD-10-CM | POA: Insufficient documentation

## 2020-03-05 NOTE — Telephone Encounter (Signed)
Called pt to discuss questions she has prior to her attending [facility] tomorrow. No answer and unable to leave vm as inbox is full.

## 2020-03-06 ENCOUNTER — Other Ambulatory Visit: Payer: Self-pay

## 2020-03-06 ENCOUNTER — Inpatient Hospital Stay: Payer: BC Managed Care – PPO

## 2020-03-06 ENCOUNTER — Ambulatory Visit: Payer: BC Managed Care – PPO | Attending: General Surgery | Admitting: Physical Therapy

## 2020-03-06 ENCOUNTER — Ambulatory Visit
Admission: RE | Admit: 2020-03-06 | Discharge: 2020-03-06 | Disposition: A | Discharge status: Home / Self Care | Payer: BC Managed Care – PPO | Source: Ambulatory Visit | Attending: Radiation Oncology | Admitting: Radiation Oncology

## 2020-03-06 ENCOUNTER — Encounter: Payer: Self-pay | Admitting: Hematology

## 2020-03-06 ENCOUNTER — Other Ambulatory Visit: Payer: Self-pay | Admitting: General Surgery

## 2020-03-06 ENCOUNTER — Encounter: Payer: Self-pay | Admitting: *Deleted

## 2020-03-06 ENCOUNTER — Inpatient Hospital Stay: Payer: BC Managed Care – PPO | Attending: Hematology | Admitting: Hematology

## 2020-03-06 ENCOUNTER — Encounter: Payer: Self-pay | Admitting: Physical Therapy

## 2020-03-06 VITALS — BP 149/95 | HR 91 | Temp 99.1°F | Resp 18 | Ht 61.0 in | Wt 165.7 lb

## 2020-03-06 DIAGNOSIS — Z170 Estrogen receptor positive status [ER+]: Secondary | ICD-10-CM

## 2020-03-06 DIAGNOSIS — Z8249 Family history of ischemic heart disease and other diseases of the circulatory system: Secondary | ICD-10-CM | POA: Insufficient documentation

## 2020-03-06 DIAGNOSIS — C50412 Malignant neoplasm of upper-outer quadrant of left female breast: Secondary | ICD-10-CM | POA: Insufficient documentation

## 2020-03-06 DIAGNOSIS — I10 Essential (primary) hypertension: Secondary | ICD-10-CM | POA: Diagnosis not present

## 2020-03-06 DIAGNOSIS — R293 Abnormal posture: Secondary | ICD-10-CM | POA: Diagnosis present

## 2020-03-06 DIAGNOSIS — Z21 Asymptomatic human immunodeficiency virus [HIV] infection status: Secondary | ICD-10-CM | POA: Diagnosis not present

## 2020-03-06 DIAGNOSIS — Z833 Family history of diabetes mellitus: Secondary | ICD-10-CM | POA: Insufficient documentation

## 2020-03-06 DIAGNOSIS — Z5189 Encounter for other specified aftercare: Secondary | ICD-10-CM | POA: Diagnosis not present

## 2020-03-06 DIAGNOSIS — Z79899 Other long term (current) drug therapy: Secondary | ICD-10-CM | POA: Insufficient documentation

## 2020-03-06 DIAGNOSIS — Z5111 Encounter for antineoplastic chemotherapy: Secondary | ICD-10-CM | POA: Diagnosis present

## 2020-03-06 DIAGNOSIS — Z853 Personal history of malignant neoplasm of breast: Secondary | ICD-10-CM | POA: Diagnosis not present

## 2020-03-06 LAB — CBC WITH DIFFERENTIAL (CANCER CENTER ONLY)
Abs Immature Granulocytes: 0.01 10*3/uL (ref 0.00–0.07)
Basophils Absolute: 0 10*3/uL (ref 0.0–0.1)
Basophils Relative: 1 %
Eosinophils Absolute: 0.1 10*3/uL (ref 0.0–0.5)
Eosinophils Relative: 2 %
HCT: 40.5 % (ref 36.0–46.0)
Hemoglobin: 13.3 g/dL (ref 12.0–15.0)
Immature Granulocytes: 0 %
Lymphocytes Relative: 35 %
Lymphs Abs: 1.5 10*3/uL (ref 0.7–4.0)
MCH: 28.1 pg (ref 26.0–34.0)
MCHC: 32.8 g/dL (ref 30.0–36.0)
MCV: 85.6 fL (ref 80.0–100.0)
Monocytes Absolute: 0.3 10*3/uL (ref 0.1–1.0)
Monocytes Relative: 8 %
Neutro Abs: 2.4 10*3/uL (ref 1.7–7.7)
Neutrophils Relative %: 54 %
Platelet Count: 288 10*3/uL (ref 150–400)
RBC: 4.73 MIL/uL (ref 3.87–5.11)
RDW: 15.8 % — ABNORMAL HIGH (ref 11.5–15.5)
WBC Count: 4.4 10*3/uL (ref 4.0–10.5)
nRBC: 0 % (ref 0.0–0.2)

## 2020-03-06 LAB — CMP (CANCER CENTER ONLY)
ALT: 61 U/L — ABNORMAL HIGH (ref 0–44)
AST: 46 U/L — ABNORMAL HIGH (ref 15–41)
Albumin: 3.7 g/dL (ref 3.5–5.0)
Alkaline Phosphatase: 213 U/L — ABNORMAL HIGH (ref 38–126)
Anion gap: 9 (ref 5–15)
BUN: 13 mg/dL (ref 8–23)
CO2: 26 mmol/L (ref 22–32)
Calcium: 9.3 mg/dL (ref 8.9–10.3)
Chloride: 103 mmol/L (ref 98–111)
Creatinine: 0.87 mg/dL (ref 0.44–1.00)
GFR, Est AFR Am: >60 mL/min (ref >60)
GFR, Estimated: >60 mL/min (ref >60)
Glucose, Bld: 102 mg/dL — ABNORMAL HIGH (ref 70–99)
Potassium: 3.4 mmol/L — ABNORMAL LOW (ref 3.5–5.1)
Sodium: 138 mmol/L (ref 135–145)
Total Bilirubin: 0.3 mg/dL (ref 0.3–1.2)
Total Protein: 8 g/dL (ref 6.5–8.1)

## 2020-03-06 LAB — GENETIC SCREENING ORDER

## 2020-03-06 NOTE — Progress Notes (Signed)
Radiation Oncology         ([phone]) [phone] ________________________________  Name: Victoria Coffey        MRN: [id number]  Date of Service: 03/06/2020 DOB: 1956-10-27  [id number], No Pcp Per  [name], MD     REFERRING PHYSICIAN: [name], MD   DIAGNOSIS: The encounter diagnosis was Malignant neoplasm of upper-outer quadrant of left breast in female, estrogen receptor positive ([place]).   HISTORY OF PRESENT ILLNESS: Victoria Coffey is a 64 y.o. female seen in the multidisciplinary breast clinic for a new diagnosis of left breast cancer. The patient was noted to have a history of left breast cancer in 1993. She had lumpectomy, radiotherapy and chemotherapy at that time. She recently presented with a rock hard left breast and skin changes of the left breast. Diagnostic imaging revealed a very firm and dense appearing breast with trabecular thickening and central mass up to 3.9 cm on mammography, but on ultrasound this was difficult to classify. There was thickening at 9:00 and her axilla was negative. On the right by mammogram, she had skin thickening along the areola and her ultrasound showed three separate 7-8 mm right axillary nodes. Biopsies on 03/01/20 that revead an invaisve ductal carcinoma of the left breast at 9:00 which was a grade 3. A right axillary node was sampled and was consistent with a grade 3 invasive ductal carcinoma, both tumors were weakly ER positive at 10-20%, and her PR was negative, and HER2 was negative. Ki 67 was 40%. Functionally her prognostic markers are felt to be consistent with triple negative disease. She's seen today to discuss treatment recommendations for her cancer.    PREVIOUS RADIATION THERAPY: Yes  1993: The left breast was treated following lumpectomy at [facility]. She cannot recall who treated her but recalls about a 6 1/2 weeks.   PAST MEDICAL HISTORY:  Past Medical History:  Diagnosis Date  . HIV infection ([place])        PAST SURGICAL  HISTORY: Past Surgical History:  Procedure Laterality Date  . BREAST LUMPECTOMY       FAMILY HISTORY:  Family History  Problem Relation Age of Onset  . Hypertension Mother   . Congestive Heart Failure Mother   . Diabetes Mother   . Congestive Heart Failure Father   . Diabetes Sister   . Diabetes Brother   . Diabetes Brother      SOCIAL HISTORY:  reports that she has never smoked. She has never used smokeless tobacco. She reports that she does not drink alcohol or use drugs. She is married and lives in [place]. 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 She is originally from [place] but has lived in [place] since the early 1980s.    ALLERGIES: Codeine, Other, Grapefruit bioflavonoid complex, Pomegranate [punica], and Shellfish allergy   MEDICATIONS:  Current Outpatient Medications  Medication Sig Dispense Refill  . emtricitabine-rilpivir-tenofovir AF (ODEFSEY) 200-25-25 MG TABS tablet Take 1 tablet by mouth daily with breakfast. 30 tablet 11  . hydrochlorothiazide (HYDRODIURIL) 25 MG tablet Take 1 tablet (25 mg total) by mouth daily. 30 tablet 11   No current facility-administered medications for this visit.     REVIEW OF SYSTEMS: On review of systems, the patient reports that she is doing well overall. She denies any chest pain, shortness of breath, cough, fevers, chills, night sweats, unintended weight changes. She denies any bowel or bladder disturbances, and denies abdominal pain, nausea or vomiting. She denies any new  musculoskeletal or joint aches or pains. A complete review of systems is obtained and is otherwise negative.     PHYSICAL EXAM:  Wt Readings from Last 3 Encounters:  02/27/20 167 lb (75.8 kg)  02/26/20 169 lb (76.7 kg)  11/06/19 167 lb (75.8 kg)   Temp Readings from Last 3 Encounters:  02/27/20 98.2 F (36.8 C) (Temporal)  11/06/19 98.3 F (36.8 C) (Oral)  07/05/19 97.7 F (36.5 C) (Oral)   BP Readings from Last 3  Encounters:  02/27/20 (!) 146/90  11/06/19 (!) 169/98  07/05/19 (!) 150/89   Pulse Readings from Last 3 Encounters:  11/06/19 89  07/05/19 69  09/12/18 76    In general this is a well appearing African American female in no acute distress. She's alert and oriented x4 and appropriate throughout the examination. Cardiopulmonary assessment is negative for acute distress and she exhibits normal effort. Bilateral breast exam is deferred.    ECOG = 1  0 - Asymptomatic (Fully active, able to carry on all predisease activities without restriction)  1 - Symptomatic but completely ambulatory (Restricted in physically strenuous activity but ambulatory and able to carry out work of a light or sedentary nature. For example, light housework, office work)  2 - Symptomatic, <50% in bed during the day (Ambulatory and capable of all self care but unable to carry out any work activities. Up and about more than 50% of waking hours)  3 - Symptomatic, >50% in bed, but not bedbound (Capable of only limited self-care, confined to bed or chair 50% or more of waking hours)  4 - Bedbound (Completely disabled. Cannot carry on any self-care. Totally confined to bed or chair)  5 - Death   Eustace Pen MM, Creech RH, Tormey DC, et al. [phone]). "Toxicity and response criteria of the [facility]". [place] Oncol. 5 (6): 649-55    LABORATORY DATA:  Lab Results  Component Value Date   WBC 4.5 02/09/2020   HGB 13.6 02/09/2020   HCT 41.1 02/09/2020   MCV 84.6 02/09/2020   PLT 298 02/09/2020   Lab Results  Component Value Date   NA 140 02/09/2020   K 3.8 02/09/2020   CL 104 02/09/2020   CO2 26 02/09/2020   Lab Results  Component Value Date   ALT 64 (H) 02/09/2020   AST 40 (H) 02/09/2020   ALKPHOS 127 06/10/2017   BILITOT 0.2 02/09/2020      RADIOGRAPHY: No results found.     IMPRESSION/PLAN: 1. Stage IV, [id number] grade 3, ER weekly positive,  invasive ductal carcinoma of the  left breast with metastatic disease to the right axilla versus recurrent left breast cancer with a new lower stage right breast cancer. Dr. Lisbeth Renshaw discusses the pathology findings and reviews the nature of node positive breast disease. The consensus from the breast conference includes an MRI and punch biopsies of both breast areas with skin thickening. She will also have staging scans to rule out further metastatic disease. Dr. Burr Medico feels that she may in fact have a new mass in the right breast by exam so further imaging of the breast will be very helpful. She would likely proceed with neoadjuvant chemotherapy followed by surgical resection which is yet to be determined. She will likely need mastectomy on the left side. Her course with radiotherapy is still to be determined, but she may benefit from radiotherapy  if she has a separate right breast primary, to the breast or chest wall and regional  nodes. We discussed the risks, benefits, short, and long term effects of radiotherapy, and the patient will be followed to determine her surgical course and we will meet back if there is a role for radiotherapy to the right site.  2 HIV. The patient continues under the care of [name] and remains with an undetectable viral load. We will follow this expectantly.  In a visit lasting 60 minutes, greater than 50% of the time was spent face to face reviewing her case, as well as in preparation of, discussing, and coordinating the patient's care.  The above documentation reflects my direct findings during this shared patient visit. Please see the separate note by Dr. Lisbeth Renshaw on this date for the remainder of the patient's plan of care.    Carola Rhine, PAC

## 2020-03-06 NOTE — Patient Instructions (Signed)

## 2020-03-06 NOTE — Therapy (Signed)
[place], [place], [address] Phone: [phone]   Fax:  [phone]  Physical Therapy Evaluation  Patient Details  Name: Victoria Coffey MRN: [id number] Date of Birth: January 28, 1956 Referring Provider (PT): Dr. [name]   Encounter Date: 03/06/2020  PT End of Session - 03/06/20 1653    Visit Number  1    Number of Visits  2    Date for PT Re-Evaluation  09/06/20    PT Start Time  1502    PT Stop Time  1550    PT Time Calculation (min)  48 min    Activity Tolerance  Patient tolerated treatment well    Behavior During Therapy  [facility] for tasks assessed/performed       Past Medical History:  Diagnosis Date  . Breast cancer ([place])    left  . History of left breast cancer   . HIV infection ([place])   . Hypertension     Past Surgical History:  Procedure Laterality Date  . BREAST LUMPECTOMY    . CESAREAN SECTION    . left breast lumpectomy  1993    There were no vitals filed for this visit.   Subjective Assessment - 03/06/20 1645    Subjective  Patient reports she is here today to be seen by her medical team for her newly diagnosed left breast cancer.    Patient is accompained by:  Family member    Pertinent History  Patient was diagnosed on 02/28/2020 with left invasive ductal carcinoma breast cancer. The mass measures 3.9 cm and is located in the upper outer quadrant. It is ER positive, PR negative, and HER2 negative with a Ki67 of 40%. Her left breast appears to be retracted with significant skin changes. She also has 3 abnormal appearing nodes in the RIGHT axilla and 1 was biopsied and found to be positive for carcinoma. She has a history of left breast cancer in 1993 when she underwent a left lumpectomy, ALND, chemotherapy and radiation.    Patient Stated Goals  reduce lymphedema risk and learn post op shoulder ROM HEP    Currently in Pain?  No/denies         [facility] PT Assessment - 03/06/20 0001       Assessment   Medical Diagnosis  Left breast cancer    Referring Provider (PT)  Dr. [name]    Onset Date/Surgical Date  02/28/20    Hand Dominance  Right    Prior Therapy  none      Precautions   Precautions  Other (comment)    Precaution Comments  active cancer; left arm lymphedema risk      Restrictions   Weight Bearing Restrictions  No      Balance Screen   Has the patient fallen in the past 6 months  No    Has the patient had a decrease in activity level because of a fear of falling?   No    Is the patient reluctant to leave their home because of a fear of falling?   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    Leisure  She walks 2x/week for 1 hour      Cognition   Overall Cognitive Status  Within Functional Limits for tasks assessed      Posture/Postural Control   Posture/Postural Control  Postural limitations    Postural Limitations  Rounded Shoulders;Forward head      ROM / Strength   AROM / PROM / Strength  AROM;Strength      AROM   Overall AROM Comments  Cervical AROM is WNL    AROM Assessment Site  Shoulder    Right/Left Shoulder  Right;Left    Right Shoulder Extension  44 Degrees    Right Shoulder Flexion  147 Degrees    Right Shoulder ABduction  145 Degrees    Right Shoulder Internal Rotation  69 Degrees    Right Shoulder External Rotation  76 Degrees    Left Shoulder Extension  48 Degrees    Left Shoulder Flexion  140 Degrees    Left Shoulder ABduction  142 Degrees    Left Shoulder Internal Rotation  78 Degrees    Left Shoulder External Rotation  81 Degrees      Strength   Overall Strength  Within functional limits for tasks  performed        LYMPHEDEMA/ONCOLOGY QUESTIONNAIRE - 03/06/20 1651      Type   Cancer Type  Left breast cancer      Lymphedema Assessments   Lymphedema Assessments  Upper extremities      Right Upper Extremity Lymphedema   10 cm Proximal to Olecranon Process  33.3 cm    Olecranon Process  26 cm    10 cm Proximal to Ulnar Styloid Process  21.6 cm    Just Proximal to Ulnar Styloid Process  16.3 cm    Across Hand at [employer]  18.8 cm    At [place] of 2nd Digit  6.5 cm      Left Upper Extremity Lymphedema   10 cm Proximal to Olecranon Process  32.6 cm    Olecranon Process  26.5 cm    10 cm Proximal to Ulnar Styloid Process  20.9 cm    Just Proximal to Ulnar Styloid Process  15.8 cm    Across Hand at [employer]  19.2 cm    At [place] of 2nd Digit  6.2 cm          Quick Dash - 03/06/20 0001    Open a tight or new jar  No difficulty    Do heavy household chores (wash walls, wash floors)  No difficulty    Carry a shopping bag or briefcase  No difficulty    Wash your back  No difficulty    Use a knife to cut food  No difficulty    Recreational activities in which you take some force or impact through your arm, shoulder, or hand (golf, hammering, tennis)  No difficulty    During the past week, to what extent has your arm, shoulder or hand problem interfered with your normal social activities with family, friends, neighbors, or groups?  Not at all    During the past week, to what extent has your arm, shoulder or hand problem limited your work or other regular daily activities  Not at all    Arm, shoulder, or hand pain.  None    Tingling (pins and needles) in your arm, shoulder, or hand  None    Difficulty Sleeping  No difficulty  DASH Score  0 %        Objective measurements completed on examination: See above findings.       Patient was instructed today in a home exercise program today for post op shoulder range of motion. These included active assist shoulder  flexion in sitting, scapular retraction, wall walking with shoulder abduction, and hands behind head external rotation.  She was encouraged to do these twice a day, holding 3 seconds and repeating 5 times when permitted by her physician.          PT Education - 03/06/20 1652    Education Details  Lymphedema risk reduction and post op shoulder ROM HEP    Person(s) Educated  Patient;Spouse    Methods  Explanation;Demonstration;Handout    Comprehension  Returned demonstration;Verbalized understanding          PT Long Term Goals - 03/06/20 1658      PT LONG TERM GOAL #1   Title  Patient will demonstrate she has regained full shoulder ROM and function post operatively compared to baselines.    Time  6    Period  Months    Status  New    Target Date  09/06/20      Breast Clinic Goals - 03/06/20 1658      Patient will be able to verbalize understanding of pertinent lymphedema risk reduction practices relevant to her diagnosis specifically related to skin care.   Time  1    Period  Days    Status  Achieved      Patient will be able to return demonstrate and/or verbalize understanding of the post-op home exercise program related to regaining shoulder range of motion.   Time  1    Period  Days    Status  Achieved      Patient will be able to verbalize understanding of the importance of attending the postoperative After Breast Cancer Class for further lymphedema risk reduction education and therapeutic exercise.   Time  1    Period  Days    Status  Achieved            Plan - 03/06/20 1654    Clinical Impression Statement  Patient was diagnosed on 02/28/2020 with left invasive ductal carcinoma breast cancer. The mass measures 3.9 cm and is located in the upper outer quadrant. It is ER positive, PR negative, and HER2 negative with a Ki67 of 40%. Her left breast appears to be retracted with significant skin changes. She also has 3 abnormal appearing nodes in the RIGHT axilla  and 1 was biopsied and found to be positive for carcinoma. She has a history of left breast cancer in 1993 when she underwent a left lumpectomy, ALND, chemotherapy and radiation. Her multidisciplinary medical team met prior ot her assessments to determine a recommended treatment plan. She is planning to undergo staging scans, neoadjuvant chemotherapy, possibly a left mastectomy and anti-estrogen therapy. It is unknown if any treatment will need to include her right side until she undergoes further testing. This will be determine after staging scans. She will benefit from a post op PT reassessment to determine needs.    Personal Factors and Comorbidities  Comorbidity 1    Comorbidities  Previous left breast cancer; unknown extent of disease    Stability/Clinical Decision Making  Stable/Uncomplicated    Clinical Decision Making  Low    Rehab Potential  Good    PT Frequency  --   Eval and 1 f/u after  surgery to determine needs   PT Treatment/Interventions  ADLs/Self Care Home Management;Therapeutic exercise;Patient/family education    PT Next Visit Plan  Post op reassessment 3-4 weeks after surgery    PT Home Exercise Plan  Post op shoulder ROM HEP    Consulted and Agree with Plan of Care  Patient;Family member/caregiver    Family Member Consulted  husband       Patient will benefit from skilled therapeutic intervention in order to improve the following deficits and impairments:  Decreased knowledge of precautions, Impaired UE functional use, Pain, Postural dysfunction, Decreased range of motion  Visit Diagnosis: Malignant neoplasm of upper-outer quadrant of left breast in female, estrogen receptor positive ([place]) - Plan: PT plan of care cert/re-cert  Abnormal posture - Plan: PT plan of care cert/re-cert   The patient was assessed using the L-Dex machine today to produce a lymphedema index baseline score. The patient will be reassessed on a regular basis (typically every 3 months) to obtain new  L-Dex scores. If the score is > 6.5 points away from his/her baseline score indicating onset of subclinical lymphedema, it will be recommended to wear a compression garment for 4 weeks, 12 hours per day and then be reassessed. If the score continues to be > 6.5 points from baseline at reassessment, we will initiate lymphedema treatment. Assessing in this manner has a 95% rate of preventing clinically significant lymphedema.  Patient will follow up at outpatient cancer rehab 3-4 weeks following surgery.  If the patient requires physical therapy at that time, a specific plan will be dictated and sent to the referring physician for approval. The patient was educated today on appropriate basic range of motion exercises to begin post operatively and the importance of attending the After Breast Cancer class following surgery.  Patient was educated today on lymphedema risk reduction practices as it pertains to recommendations that will benefit the patient immediately following surgery.  She verbalized good understanding.      Problem List Patient Active Problem List   Diagnosis Date Noted  . Malignant neoplasm of upper-outer quadrant of left breast in female, estrogen receptor positive ([place]) 03/05/2020  . Rash and nonspecific skin eruption 11/08/2013  . History of breast cancer 12/20/2012  . HYPERTENSION, BENIGN ESSENTIAL 08/11/2007  . HIV DISEASE 04/14/2007   Annia Friendly, PT 03/06/20 5:00 PM  [place], [place], [address] Phone: [phone]   Fax:  [phone]  Name: THIA OLESEN MRN: [id number] Date of Birth: April 03, 1956

## 2020-03-07 ENCOUNTER — Encounter: Payer: Self-pay | Admitting: *Deleted

## 2020-03-07 ENCOUNTER — Encounter: Payer: Self-pay | Admitting: Hematology

## 2020-03-08 ENCOUNTER — Encounter: Payer: Self-pay | Admitting: General Practice

## 2020-03-08 NOTE — Progress Notes (Signed)
[place] Psychosocial Distress Screening Spiritual Care  Met with Victoria Coffey by phone following Breast Multidisciplinary Clinic to introduce [facility] team/resources, reviewing distress screen per protocol.  The patient scored a 8 on the Psychosocial Distress Thermometer which indicates severe distress. Also assessed for distress and other psychosocial needs.   ONCBCN DISTRESS SCREENING 03/08/2020  Screening Type Initial Screening  Distress experienced in past week (1-10) 8  Emotional problem type Nervousness/Anxiety;Adjusting to illness  Physical Problem type Pain  Referral to support programs Yes    Victoria Coffey was very appreciative of follow-up call, noting that she was better able to utilize the support after processing [name]. She notes that she is "reactivating" several coping tools, such as walking in nature and working to manage her perspective, in addition to her regular prayer for meaning-making and support. Provided empathic listening, emotional support, and prayer per request.  Follow up needed: No. Per [name], no other needs at this time, but she is aware of ongoing Support Team and programming availability.   [place], [place], [facility] Pager [phone] Voicemail [phone]

## 2020-03-12 ENCOUNTER — Telehealth: Payer: Self-pay

## 2020-03-12 NOTE — Telephone Encounter (Signed)
Nutrition Assessment  Reason for Assessment:  Pt attended Breast Clinic on 03/06/20 and was given nutrition packet by nurse navigator.   ASSESSMENT:  64 year old female with left breast cancer and possible right breast cancer.  History of left breast cancer in 1993.  Planning neoadjuvant chemotherapy, surgery, adjuvant radiation, antiestrogens  Spoke with patient via phone to introduce self and service at [facility].  Patient reports that she is unable to talk at this time.    Medications:  reviewed  Labs: reviewed  Anthropometrics:   Height: 61 inches Weight: 165 lb BMI: 31   NUTRITION DIAGNOSIS: Food and nutrition related knowledge deficit related to new diagnosis of breast cancer as evidenced by no prior need for nutrition related information.  INTERVENTION:   Patient will call RD back for further discussion.  Contact information provided.    MONITORING, EVALUATION, and GOAL: Pt will consume a healthy plant based diet to maintain lean body mass throughout treatment.   Victoria Coffey B. Zenia Resides, [place], [place] Registered Dietitian [phone] (pager)

## 2020-03-14 ENCOUNTER — Encounter: Payer: Self-pay | Admitting: *Deleted

## 2020-03-14 ENCOUNTER — Telehealth: Payer: Self-pay | Admitting: *Deleted

## 2020-03-14 ENCOUNTER — Other Ambulatory Visit: Payer: Self-pay | Admitting: Hematology

## 2020-03-14 DIAGNOSIS — Z170 Estrogen receptor positive status [ER+]: Secondary | ICD-10-CM

## 2020-03-14 MED ORDER — ONDANSETRON HCL 8 MG PO TABS: 8.0000 mg | ORAL_TABLET | Freq: Two times a day (BID) | ORAL | 1 refills | Status: DC | PRN

## 2020-03-14 MED ORDER — LIDOCAINE-PRILOCAINE 2.5-2.5 % EX CREA: TOPICAL_CREAM | CUTANEOUS | 3 refills | Status: DC

## 2020-03-14 MED ORDER — PROCHLORPERAZINE MALEATE 10 MG PO TABS: 10.0000 mg | ORAL_TABLET | Freq: Four times a day (QID) | ORAL | 1 refills | Status: DC | PRN

## 2020-03-14 NOTE — Progress Notes (Signed)
START ON PATHWAY REGIMEN - Breast     A cycle is every 14 days (cycles 1-4):     Doxorubicin      Cyclophosphamide      Pegfilgrastim-xxxx    A cycle is every 21 days (cycles 5-8):     Paclitaxel      Carboplatin   **Always confirm dose/schedule in your pharmacy ordering system**  Patient Characteristics: Preoperative or Nonsurgical Candidate (Clinical Staging), Neoadjuvant Therapy followed by Surgery, Invasive Disease, Chemotherapy, HER2 Negative/Unknown/Equivocal, ER Negative/Unknown, Platinum Therapy Indicated Therapeutic Status: Preoperative or Nonsurgical Candidate (Clinical Staging) AJCC M Category: cM0 AJCC Grade: G3 Breast Surgical Plan: Neoadjuvant Therapy followed by Surgery ER Status: Negative (-) AJCC 8 Stage Grouping: IIIC HER2 Status: Negative (-) AJCC T Category: cT4 AJCC N Category: cN1 PR Status: Negative (-) Type of Therapy: Platinum Therapy Indicated Intent of Therapy: Curative Intent, Discussed with Patient

## 2020-03-14 NOTE — Telephone Encounter (Signed)
Spoke to pt concerning [place] from 5.5.21. Denies questions regarding dx or treatment care plan. Discussed and confirmed further appts as well as contrast for CT scan. Encourage pt to call with further questions or needs. Received verbal understanding.

## 2020-03-15 ENCOUNTER — Telehealth: Payer: Self-pay | Admitting: *Deleted

## 2020-03-15 NOTE — Telephone Encounter (Signed)
Call pt and discussed port and 1st chemo will be on 5/21. Informed Dr. Marlowe Aschoff office will call with new information on time and location. Received verbal understanding. Denies further questions or needs at this time.

## 2020-03-15 NOTE — Telephone Encounter (Signed)
Spoke to pt concerning future appts. Confirmed chemo dates and times. Denies further needs or questions at this time.

## 2020-03-18 ENCOUNTER — Inpatient Hospital Stay: Payer: BC Managed Care – PPO

## 2020-03-18 ENCOUNTER — Ambulatory Visit (HOSPITAL_COMMUNITY)
Admission: RE | Admit: 2020-03-18 | Discharge: 2020-03-18 | Disposition: A | Discharge status: Home / Self Care | Payer: BC Managed Care – PPO | Source: Ambulatory Visit | Attending: Hematology | Admitting: Hematology

## 2020-03-18 ENCOUNTER — Other Ambulatory Visit: Payer: Self-pay

## 2020-03-18 DIAGNOSIS — C50412 Malignant neoplasm of upper-outer quadrant of left female breast: Secondary | ICD-10-CM | POA: Insufficient documentation

## 2020-03-18 DIAGNOSIS — Z170 Estrogen receptor positive status [ER+]: Secondary | ICD-10-CM | POA: Insufficient documentation

## 2020-03-18 MED ORDER — GADOBUTROL 1 MMOL/ML IV SOLN
7.0000 mL | Freq: Once | INTRAVENOUS | Status: AC | PRN
  Administered 2020-03-18: 7.5 mL via INTRAVENOUS

## 2020-03-18 NOTE — Progress Notes (Signed)
Pharmacist Chemotherapy Monitoring - Initial Assessment    Anticipated start date: 03/22/20  Regimen:  . Are orders appropriate based on the patient's diagnosis, regimen, and cycle? Yes . Does the plan date match the patient's scheduled date? Yes . Is the sequencing of drugs appropriate? Yes . Are the premedications appropriate for the patient's regimen? Yes . Prior Authorization for treatment is: Pending o If applicable, is the correct biosimilar selected based on the patient's insurance? not applicable  Organ Function and Labs: Marland Kitchen Are dose adjustments needed based on the patient's renal function, hepatic function, or hematologic function? No . Are appropriate labs ordered prior to the start of patient's treatment? Yes . Other organ system assessment, if indicated: anthracyclines: Echo/ MUGA . The following baseline labs, if indicated, have been ordered: N/A  Dose Assessment: . Are the drug doses appropriate? Yes . Are the following correct: o Drug concentrations Yes o IV fluid compatible with drug Yes o Administration routes Yes o Timing of therapy Yes . If applicable, does the patient have documented access for treatment and/or plans for port-a-cath placement? yes . If applicable, have lifetime cumulative doses been properly documented and assessed? yes Lifetime Dose Tracking  No doses have been documented on this patient for the following tracked chemicals: Doxorubicin, Epirubicin, Idarubicin, Daunorubicin, Mitoxantrone, Bleomycin, Oxaliplatin, Carboplatin, Liposomal Doxorubicin  o   Toxicity Monitoring/Prevention: . The patient has the following take home antiemetics prescribed: Ondansetron and Prochlorperazine . The patient has the following take home medications prescribed: N/A . Medication allergies and previous infusion related reactions, if applicable, have been reviewed and addressed. Yes . The patient's current medication list has been assessed for drug-drug interactions  with their chemotherapy regimen. no significant drug-drug interactions were identified on review.  Order Review: . Are the treatment plan orders signed? No . Is the patient scheduled to see a provider prior to their treatment? Yes  I verify that I have reviewed each item in the above checklist and answered each question accordingly.  Romualdo Bolk [facility] 03/18/2020 2:49 PM

## 2020-03-18 NOTE — Progress Notes (Signed)
[facility] [id number], [place] [address] Phone: [phone] Fax: [phone]      Your procedure is scheduled on Friday, Mar 22, 2020.  Report to [facility] Main Entrance "A" at 5:30 A.M., and check in at the Admitting office.  Call this number if you have problems the morning of surgery:  [phone]  Call [phone] if you have any questions prior to your surgery date Monday-Friday 8am-4pm    Remember:  Do not eat or drink after midnight the night before your surgery  You may drink clear liquids until 4:30 AM the morning of your surgery.   Clear liquids allowed are: Water, Non-Citrus Juices (without pulp), Carbonated Beverages, Clear Tea, Black Coffee Only, and Gatorade  Please complete your PRE-SURGERY ENSURE that was provided to you by 4:30 AM the morning of surgery.  Please, if able, drink it in one setting. DO NOT SIP.     Take these medicines the morning of surgery with A SIP OF WATER:  emtricitabine-rilpivir-tenofovir AF (ODEFSEY) acetaminophen (TYLENOL) - if needed prochlorperazine (COMPAZINE) - if needed  As of today, STOP taking any Aspirin (unless otherwise instructed by your surgeon) and Aspirin containing products, Aleve, Naproxen, Ibuprofen, Motrin, Advil, Goody's, BC's, all herbal medications, fish oil, and all vitamins.                      Do not wear jewelry, make up, or nail polish            Do not wear lotions, powders, perfumes, or deodorant.            Do not shave 48 hours prior to surgery.            Do not bring valuables to the hospital.            [facility] is not responsible for any belongings or valuables.  Do NOT Smoke (Tobacco/Vapping) or drink Alcohol 24 hours prior to your procedure If you use a CPAP at night, you may bring all equipment for your overnight stay.   Contacts, glasses, dentures or bridgework may not be worn  into surgery.      For patients admitted to the hospital, discharge time will be determined by your treatment team.   Patients discharged the day of surgery will not be allowed to drive home, and someone needs to stay with them for 24 hours.    Special instructions:   [facility]- Preparing For Surgery  Before surgery, you can play an important role. Because skin is not sterile, your skin needs to be as free of germs as possible. You can reduce the number of germs on your skin by washing with CHG (chlorahexidine gluconate) Soap before surgery.  CHG is an antiseptic cleaner which kills germs and bonds with the skin to continue killing germs even after washing.    Oral Hygiene is also important to reduce your risk of infection.  Remember - BRUSH YOUR TEETH THE MORNING OF SURGERY WITH YOUR REGULAR TOOTHPASTE  Please do not use if you have an allergy to CHG or antibacterial soaps. If your skin becomes reddened/irritated stop using the CHG.  Do not shave (including legs and underarms) for at least 48 hours prior to first CHG shower. It is OK to shave your face.  Please follow these instructions carefully.   1. Shower the [employer] BEFORE SURGERY  and the MORNING OF SURGERY with CHG Soap.   2. If you chose to wash your hair, wash your hair first as usual with your normal shampoo.  3. After you shampoo, rinse your hair and body thoroughly to remove the shampoo.  4. Use CHG as you would any other liquid soap. You can apply CHG directly to the skin and wash gently with a scrungie or a clean washcloth.   5. Apply the CHG Soap to your body ONLY FROM THE NECK DOWN.  Do not use on open wounds or open sores. Avoid contact with your eyes, ears, mouth and genitals (private parts). Wash Face and genitals (private parts)  with your normal soap.   6. Wash thoroughly, paying special attention to the area where your surgery will be performed.  7. Thoroughly rinse your body with warm water from the neck  down.  8. DO NOT shower/wash with your normal soap after using and rinsing off the CHG Soap.  9. Pat yourself dry with a CLEAN TOWEL.  10. Wear CLEAN PAJAMAS to bed the night before surgery, wear comfortable clothes the morning of surgery  11. Place CLEAN SHEETS on your bed the night of your first shower and DO NOT SLEEP WITH PETS.   Day of Surgery:   Do not apply any deodorants/lotions.  Please wear clean clothes to the hospital/surgery center.   Remember to brush your teeth WITH YOUR REGULAR TOOTHPASTE.   Please read over the following fact sheets that you were given.

## 2020-03-19 ENCOUNTER — Encounter (HOSPITAL_COMMUNITY)
Admission: RE | Admit: 2020-03-19 | Discharge: 2020-03-19 | Disposition: A | Discharge status: Home / Self Care | Payer: BC Managed Care – PPO | Source: Ambulatory Visit | Attending: General Surgery | Admitting: General Surgery

## 2020-03-19 ENCOUNTER — Other Ambulatory Visit (HOSPITAL_COMMUNITY)
Admission: RE | Admit: 2020-03-19 | Discharge: 2020-03-19 | Disposition: A | Discharge status: Home / Self Care | Payer: BC Managed Care – PPO | Source: Ambulatory Visit | Attending: General Surgery | Admitting: General Surgery

## 2020-03-19 ENCOUNTER — Encounter (HOSPITAL_COMMUNITY): Payer: Self-pay

## 2020-03-19 ENCOUNTER — Other Ambulatory Visit: Payer: Self-pay

## 2020-03-19 DIAGNOSIS — Z01818 Encounter for other preprocedural examination: Secondary | ICD-10-CM | POA: Insufficient documentation

## 2020-03-19 DIAGNOSIS — Z20822 Contact with and (suspected) exposure to covid-19: Secondary | ICD-10-CM | POA: Diagnosis not present

## 2020-03-19 LAB — SARS CORONAVIRUS 2 (TAT 6-24 HRS): SARS Coronavirus 2: NEGATIVE

## 2020-03-19 LAB — BASIC METABOLIC PANEL
Anion gap: 13 (ref 5–15)
BUN: 12 mg/dL (ref 8–23)
CO2: 25 mmol/L (ref 22–32)
Calcium: 9.5 mg/dL (ref 8.9–10.3)
Chloride: 102 mmol/L (ref 98–111)
Creatinine, Ser: 0.91 mg/dL (ref 0.44–1.00)
GFR calc Af Amer: >60 mL/min (ref >60)
GFR calc non Af Amer: >60 mL/min (ref >60)
Glucose, Bld: 107 mg/dL — ABNORMAL HIGH (ref 70–99)
Potassium: 3.7 mmol/L (ref 3.5–5.1)
Sodium: 140 mmol/L (ref 135–145)

## 2020-03-19 LAB — CBC
HCT: 43.5 % (ref 36.0–46.0)
Hemoglobin: 13.9 g/dL (ref 12.0–15.0)
MCH: 28 pg (ref 26.0–34.0)
MCHC: 32 g/dL (ref 30.0–36.0)
MCV: 87.5 fL (ref 80.0–100.0)
Platelets: 367 10*3/uL (ref 150–400)
RBC: 4.97 MIL/uL (ref 3.87–5.11)
RDW: 16.4 % — ABNORMAL HIGH (ref 11.5–15.5)
WBC: 4.5 10*3/uL (ref 4.0–10.5)
nRBC: 0 % (ref 0.0–0.2)

## 2020-03-19 NOTE — H&P (Signed)
Victoria Coffey Appointment: 03/06/2020 1:00 PM Location: [facility] Patient #: [id number] DOB: 08/30/1956 Undefined / Language: English / Race: Black or African American Female   History of Present Illness (Faera Byerly MD; 03/06/2020 5:29 PM) The patient is a 63 year old female who presents with breast cancer.Pt is a 63 yo F who is referred by Dr. Snider for consultation with a new diagnosis of bilateral breast cancer. The patient has a history of left breast cancer in 1992. At that point, she had lumpectomy, ALND, chemo, and radiation. She presents with skin changes of redness and swelling and a firm breast on the left. Upon diagnostic imaging, the left breast had at least a 3.9 cm mass with skin thickening of around 6 mm. The mass was indistinct and difficult to image. Surprisingly, the right side had adenopathy seen, but no breast masses seen. It originally looked like a single node on mammo, but at ultrasound looked like three clumped nodes. The right side also had skin changes, but less than the left. She had core needle biopsy on both sides. 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 The right axillary LN biopsy was similar with grade 3 invasive ductal carcinoma that was weakly ER + /PR-, her 2-.   She has no family history of cancer. She is a G2P2 with first child at age 30. Menarche at age 12. She took hormonal contraception for 13 years, but stopped with her diagnosis of cancer. She had a colonoscopy in 1993. She has never had a bone density study, and last pap was 2017. Of note, the patient has HIV, but it is well controlled.   imaging is at [facility].  pathology is described above.  CMET and CBC performed today (03/06/2020) LFTs sl elevated (AST, ALT, Alk phos) CBC normal  HIV labs 4/9 T helper cells 617 HIV RNA 69 copies /mL    Past Surgical History (Faera Byerly, MD; 03/06/2020 5:00 PM) Breast Biopsy   Bilateral. Breast Mass; Local Excision  Left. Cesarean Section - Multiple   Diagnostic Studies History (Faera Byerly, MD; 03/06/2020 5:00 PM) Colonoscopy  >10 years ago Mammogram  >3 years ago Pap Smear  >5 years ago  Medication History (Wendy Smith, RN; 03/06/2020 7:53 AM) Medications Reconciled  Social History (Faera Byerly, MD; 03/06/2020 5:00 PM) Alcohol use  Occasional alcohol use. Caffeine use  Coffee, Tea. No drug use  Tobacco use  Never smoker.  Family History (Faera Byerly, MD; 03/06/2020 5:00 PM) Diabetes Mellitus  Mother. Heart Disease  Father, Mother. Hypertension  Father, Mother.  Pregnancy / Birth History (Faera Byerly, MD; 03/06/2020 5:00 PM) Age at menarche  12 years. Age of menopause  <45 Contraceptive History  Oral contraceptives. Gravida  2 Maternal age  26-30 Para  2  Other Problems (Faera Byerly, MD; 03/06/2020 5:00 PM) Breast Cancer  HIV-positive  Lump In Breast     Review of Systems (Faera Byerly MD; 03/06/2020 5:00 PM) General Present- Weight Gain. Not Present- Appetite Loss, Chills, Fatigue, Fever, Night Sweats and Weight Loss. Skin Not Present- Change in Wart/Mole, Dryness, Hives, Jaundice, New Lesions, Non-Healing Wounds, Rash and Ulcer. HEENT Not Present- Earache, Hearing Loss, Hoarseness, Nose Bleed, Oral Ulcers, Ringing in the Ears, Seasonal Allergies, Sinus Pain, Sore Throat, Visual Disturbances, Wears glasses/contact lenses and Yellow Eyes. Respiratory Present- Snoring. Not Present- Bloody sputum, Chronic Cough, Difficulty Breathing and Wheezing. Breast Present- Breast Mass, Breast Pain and   Skin Changes. Not Present- Nipple Discharge. Cardiovascular Not Present- Chest Pain, Difficulty Breathing Lying Down, Leg Cramps, Palpitations, Rapid Heart Rate, Shortness of Breath and Swelling of Extremities. Female Genitourinary Not Present- Frequency, Nocturia, Painful Urination, Pelvic Pain and Urgency. Musculoskeletal Not Present- Back  Pain, Joint Pain, Joint Stiffness, Muscle Pain, Muscle Weakness and Swelling of Extremities. Neurological Not Present- Decreased Memory, Fainting, Headaches, Numbness, Seizures, Tingling, Tremor, Trouble walking and Weakness. Psychiatric Not Present- Anxiety, Bipolar, Change in Sleep Pattern, Depression, Fearful and Frequent crying. Endocrine Not Present- Cold Intolerance, Excessive Hunger, Hair Changes, Heat Intolerance, Hot flashes and New Diabetes. Hematology Present- HIV. Not Present- Blood Thinners, Easy Bruising, Excessive bleeding, Gland problems and Persistent Infections.  Vitals (Faera Byerly MD; 03/06/2020 5:30 PM) 03/06/2020 5:29 PM Weight: 165.7 lb Height: 61in Body Surface Area: 1.74 m Body Mass Index: 31.31 kg/m  Temp.: 99.1F  Pulse: 91 (Regular)  Resp.: 18 (Unlabored)  BP: 149/95(Sitting, Left Arm, Standard)       Physical Exam (Faera Byerly MD; 03/06/2020 5:11 PM) General Mental Status-Alert. General Appearance-Consistent with stated age. Hydration-Well hydrated. Voice-Normal.  Head and Neck Head-normocephalic, atraumatic with no lesions or palpable masses. Trachea-midline. Thyroid Gland Characteristics - normal size and consistency.  Eye Eyeball - Bilateral-Extraocular movements intact. Sclera/Conjunctiva - Bilateral-No scleral icterus.  Chest and Lung Exam Chest and lung exam reveals -quiet, even and easy respiratory effort with no use of accessory muscles and on auscultation, normal breath sounds, no adventitious sounds and normal vocal resonance. Inspection Chest Wall - Normal. Back - normal.  Breast Note: left breast is very firm and hard. The mass feels like it is around 6 cm. The left breast is red and edematous. It is also seems fixed to the chest wall. The right breast does not have as significant skin change, but the lateral breast has some lymphedema on the right breast. There is no palpable  LAD.   Cardiovascular Cardiovascular examination reveals -normal heart sounds, regular rate and rhythm with no murmurs and normal pedal pulses bilaterally.  Abdomen Inspection Inspection of the abdomen reveals - No Hernias. Palpation/Percussion Palpation and Percussion of the abdomen reveal - Soft, Non Tender, No Rebound tenderness, No Rigidity (guarding) and No hepatosplenomegaly. Auscultation Auscultation of the abdomen reveals - Bowel sounds normal.  Neurologic Neurologic evaluation reveals -alert and oriented x 3 with no impairment of recent or remote memory. Mental Status-Normal.  Musculoskeletal Global Assessment -Note: no gross deformities.  Normal Exam - Left-Upper Extremity Strength Normal and Lower Extremity Strength Normal. Normal Exam - Right-Upper Extremity Strength Normal and Lower Extremity Strength Normal.  Lymphatic Head & Neck  General Head & Neck Lymphatics: Bilateral - Description - Normal. Axillary  General Axillary Region: Bilateral - Description - Normal. Tenderness - Non Tender. Femoral & Inguinal  Generalized Femoral & Inguinal Lymphatics: Bilateral - Description - No Generalized lymphadenopathy.    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 I will plan port placement for chemotherapy. I will plan punch biopsies at the time of port placement.  She will get staging scans and breast MR. We need to see the right breast to evaluate if there is disease in the breast and need to see if there is invasion into the pectoralis muscle on the left.  If she has widespread metastatic disease, she may not come to definitive surgery, but if she doesn't, we will   make final surgical plan at that time. Also, the punch biopsies will affect surgery if she has inflammatory breast  cancer.  The patient and her husband understand the plan. I reviewed surgery for the port including diagrams of anatomy. I discussed risks and benefits as well as recovery and restrictions.  Questions were answered. BREAST CANCER METASTASIZED TO AXILLARY LYMPH NODE, RIGHT (C50.911) Impression: See above. HIV, ASYMPTOMATIC (Z21) Impression: Well controlled.    Signed electronically by Faera Byerly, MD (03/06/2020 5:31 PM) 

## 2020-03-19 NOTE — Progress Notes (Signed)
PCP Baxter Flattery, MD Cardiologist - pt denies Oncologist - Burr Medico, MD  PPM/ICD - n/a  Chest x-ray - n/a EKG - 03/19/20 Stress Test - pt denies ECHO - scheduled 03/20/20 for pre-procedure Cardiac Cath - pt denies   Blood Thinner Instructions:n/a Aspirin Instructions:n/a  ERAS Protcol - yes PRE-SURGERY Ensure or G2- ensure  COVID TEST- 03/19/20  Coronavirus Screening  Have you experienced the following symptoms:  Cough yes/no: No Fever (>100.53F)  yes/no: No Runny nose yes/no: No Sore throat yes/no: No Difficulty breathing/shortness of breath  yes/no: No  Have you or a family member traveled in the last 14 days and where? yes/no: No   If the patient indicates "YES" to the above questions, their PAT will be rescheduled to limit the exposure to others and, the surgeon will be notified. THE PATIENT WILL NEED TO BE ASYMPTOMATIC FOR 14 DAYS.   If the patient is not experiencing any of these symptoms, the PAT nurse will instruct them to NOT bring anyone with them to their appointment since they may have these symptoms or traveled as well.   Please remind your patients and families that hospital visitation restrictions are in effect and the importance of the restrictions.   Physical exam: 02/27/20 08:30 - copied from Vernice Jefferson, NP (see notes) Breasts Breasts asymmetrical. Left breast significantly smaller than right, entire breast is hard, immobile, reddened skin, dry, cracked nipple and areola with prominent veins on skin surface surrounding areola. There is a scar above the left breast that patient reports is from a lumpectomy in the 90s for her initial diagnosis of breast cancer. No skin abnormalities right breast. No nipple retraction bilateral breasts. No nipple discharge bilateral breasts. No lymphadenopathy. No lumps palpated bilateral breasts.  Per pt documentation that was brought to PAT, surgical pathology report done 03/01/20 by Emmit Pomfret, MD @ [facility] -  biopsy of both breasts were done per document.   Upon instructions on CHG soap in PAT, pt said her breast was more irritated and "looked different" than when she got her biopsy on 4/30, she felt as if it was more painful and uncomfortable, per pt, skin felt tighter and felt more swelling than what it was prior to her biopsy. Pt breast assessed, L breast very hard, immobile, hot to the touch upon palpation, skin was reddened, areola was dry, cracked, nipples were dry and cracked, scab on 7 o'clock position on areola from biopsy per pt.  R breast has not changed much per pt. Only slight reddness developing and some warmth and hardening under the skin on palpation.   Pt was instructed to not apply CHG soap directly to her L breast prior to surgery.  Livia Snellen, RN spoken to at [facility] - pt sent over to [facility] for nurse to assess pt with Dr. [name] in the office to make sure everything was fine prior to surgery.    Anesthesia review: yes  Patient denies shortness of breath, fever, cough and chest pain at PAT appointment   All instructions explained to the patient, with a verbal understanding of the material. Patient agrees to go over the instructions while at home for a better understanding. Patient also instructed to self quarantine after being tested for COVID-19. The opportunity to ask questions was provided.

## 2020-03-20 ENCOUNTER — Encounter: Payer: Self-pay | Admitting: *Deleted

## 2020-03-20 ENCOUNTER — Ambulatory Visit (HOSPITAL_BASED_OUTPATIENT_CLINIC_OR_DEPARTMENT_OTHER)
Admission: RE | Admit: 2020-03-20 | Discharge: 2020-03-20 | Disposition: A | Discharge status: Home / Self Care | Payer: BC Managed Care – PPO | Source: Ambulatory Visit | Attending: Hematology | Admitting: Hematology

## 2020-03-20 ENCOUNTER — Encounter (HOSPITAL_COMMUNITY): Payer: Self-pay

## 2020-03-20 ENCOUNTER — Ambulatory Visit (HOSPITAL_COMMUNITY)
Admission: RE | Admit: 2020-03-20 | Discharge: 2020-03-20 | Disposition: A | Discharge status: Home / Self Care | Payer: BC Managed Care – PPO | Source: Ambulatory Visit | Attending: Hematology | Admitting: Hematology

## 2020-03-20 ENCOUNTER — Encounter (HOSPITAL_COMMUNITY)
Admission: RE | Admit: 2020-03-20 | Discharge: 2020-03-20 | Disposition: A | Discharge status: Home / Self Care | Payer: BC Managed Care – PPO | Source: Ambulatory Visit | Attending: Hematology | Admitting: Hematology

## 2020-03-20 DIAGNOSIS — C773 Secondary and unspecified malignant neoplasm of axilla and upper limb lymph nodes: Secondary | ICD-10-CM | POA: Diagnosis not present

## 2020-03-20 DIAGNOSIS — Z923 Personal history of irradiation: Secondary | ICD-10-CM | POA: Diagnosis not present

## 2020-03-20 DIAGNOSIS — C50112 Malignant neoplasm of central portion of left female breast: Secondary | ICD-10-CM | POA: Diagnosis not present

## 2020-03-20 DIAGNOSIS — C50412 Malignant neoplasm of upper-outer quadrant of left female breast: Secondary | ICD-10-CM

## 2020-03-20 DIAGNOSIS — Z9221 Personal history of antineoplastic chemotherapy: Secondary | ICD-10-CM | POA: Diagnosis not present

## 2020-03-20 DIAGNOSIS — Z170 Estrogen receptor positive status [ER+]: Secondary | ICD-10-CM

## 2020-03-20 DIAGNOSIS — E669 Obesity, unspecified: Secondary | ICD-10-CM | POA: Diagnosis not present

## 2020-03-20 DIAGNOSIS — C50911 Malignant neoplasm of unspecified site of right female breast: Secondary | ICD-10-CM | POA: Diagnosis not present

## 2020-03-20 DIAGNOSIS — Z21 Asymptomatic human immunodeficiency virus [HIV] infection status: Secondary | ICD-10-CM | POA: Diagnosis not present

## 2020-03-20 DIAGNOSIS — Z853 Personal history of malignant neoplasm of breast: Secondary | ICD-10-CM | POA: Diagnosis not present

## 2020-03-20 DIAGNOSIS — I10 Essential (primary) hypertension: Secondary | ICD-10-CM | POA: Diagnosis not present

## 2020-03-20 DIAGNOSIS — Z6830 Body mass index (BMI) 30.0-30.9, adult: Secondary | ICD-10-CM | POA: Diagnosis not present

## 2020-03-20 MED ORDER — IOHEXOL 300 MG/ML  SOLN
100.0000 mL | Freq: Once | INTRAMUSCULAR | Status: AC | PRN
  Administered 2020-03-20: 100 mL via INTRAVENOUS

## 2020-03-20 MED ORDER — SODIUM CHLORIDE (PF) 0.9 % IJ SOLN
INTRAMUSCULAR | Status: AC
  Filled 2020-03-20: qty 50

## 2020-03-20 MED ORDER — TECHNETIUM TC 99M MEDRONATE IV KIT
20.0000 | PACK | Freq: Once | INTRAVENOUS | Status: AC | PRN
  Administered 2020-03-20: 22 via INTRAVENOUS

## 2020-03-20 NOTE — Progress Notes (Signed)
  Echocardiogram 2D Echocardiogram has been performed.  Victoria Coffey 03/20/2020, 10:59 AM

## 2020-03-21 ENCOUNTER — Encounter (HOSPITAL_COMMUNITY): Payer: Self-pay | Admitting: General Surgery

## 2020-03-21 ENCOUNTER — Encounter: Payer: Self-pay | Admitting: *Deleted

## 2020-03-21 NOTE — Progress Notes (Signed)
Victoria Coffey   Telephone:([phone] Fax:([phone]) [phone]   Clinic Follow up Note   Patient Care Team: Victoria Basques, MD as PCP - General (Infectious Diseases) Victoria Basques, MD as PCP - Infectious Diseases (Infectious Diseases) Victoria Klein, MD as Consulting Physician (General Surgery) Victoria Merle, MD as Consulting Physician (Hematology) Victoria Rudd, MD as Consulting Physician (Radiation Oncology) Victoria Germany, RN as Oncology Nurse Navigator Victoria Kaufmann, RN as Oncology Nurse Navigator  Date of Service:  03/22/2020  CHIEF COMPLAINT: F/u of left breast cancer  SUMMARY OF ONCOLOGIC HISTORY: Oncology History  Malignant neoplasm of upper-outer quadrant of left breast in female, estrogen receptor positive ([place])  02/28/2020 Mammogram   Diagnostic Mammogram 02/28/20  IMPRESSION The 3.9 cm ill defined mass in the left breast posterior depth central 4.3cm to the nipple seen on the mediolateral oblique view onlt with associated difssue skin thickening is highly suspicious for malignancy.    03/01/2020 Cancer Staging   Staging form: Breast, AJCC 8th Edition - Clinical stage from 03/01/2020: Stage IV (cT4, cN0, pM1, G3, ER+, PR-, HER2-) - Signed by Victoria Merle, MD on 03/06/2020   03/01/2020 Initial Biopsy   Diagnosis 03/01/20  1. Breast, left, needle core biopsy, 9 o'clock, 3-4cmfn - INVASIVE DUCTAL CARCINOMA. SEE NOTE 2. Lymph node, needle/core biopsy, right axilla - INVASIVE DUCTAL CARCINOMA. SEE NOTE Diagnosis Note 1. Invasive carcinoma measures 1.5 cm in greatest linear dimension and appears grade 3. Dr. [name] reviewed the case and concurs with the diagnosis. A breast prognostic profile (ER, PR, Ki-67 and HER2) is pending and will be reported in an addendum. Dr. [name] was notified on 03/04/2020. 2. Carcinoma measures 0.6 cm in greatest linear dimension and appears grade 3. Lymphoid tissue is not identified - this may represent a completely replaced lymph node. A  breast prognostic profile (ER, PR, Ki-67 and HER2) is pending and will be reported in an addendum. Dr. [name] reviewed the case and concurs with the diagnosis. Dr. [name] was notified on 03/04/2020.   03/01/2020 Receptors her2   1. PROGNOSTIC INDICATORS Results: IMMUNOHISTOCHEMICAL AND MORPHOMETRIC ANALYSIS PERFORMED MANUALLY The tumor cells are NEGATIVE for Her2 (0). Estrogen Receptor: 20%, POSITIVE, WEAK STAINING INTENSITY Progesterone Receptor: 0%, NEGATIVE Proliferation Marker Ki67: 40% COMMENT: The negative hormone receptor study(ies) in this case has an internal positive control.    2. PROGNOSTIC INDICATORS Results: IMMUNOHISTOCHEMICAL AND MORPHOMETRIC ANALYSIS PERFORMED MANUALLY The tumor cells are NEGATIVE for Her2 (1+). Estrogen Receptor: 10%, POSITIVE, WEAK STAINING INTENSITY Progesterone Receptor: 0%, NEGATIVE Proliferation Marker Ki67: 40% COMMENT: The negative hormone receptor study(ies) in this case has no internal positive control.   03/05/2020 Initial Diagnosis   Malignant neoplasm of upper-outer quadrant of left breast in female, estrogen receptor positive ([place])   03/18/2020 Breast MRI   PENDING   03/20/2020 Echocardiogram   Baseline ECHO  IMPRESSIONS     1. The patient was reported to be in pain during the study and not able  to participate in this study. All that can be said is that the LV and RV  function are grossly normal. Would recommend to repeat this study when/if  the patient is able to tolerate  the exam.   2. Left ventricular ejection fraction, by estimation, is 60 to 65%. The  left ventricle has normal function. Left ventricular endocardial border  not optimally defined to evaluate regional wall motion. Left ventricular  diastolic function could not be  evaluated.   3. Right ventricular systolic function is normal. The right  ventricular  size is normal.   4. The mitral valve was not well visualized. No evidence of mitral valve  regurgitation.    5. The aortic valve was not well visualized. Aortic valve regurgitation  is not visualized.   6. The inferior vena cava is normal in size with greater than 50%  respiratory variability, suggesting right atrial pressure of 3 mmHg.    03/20/2020 Imaging   CT CAP W contrast  IMPRESSION: 1. Small right axillary/subpectoral lymph nodes. Difficult to exclude metastatic disease. 2. Subtle 6 mm low-attenuation lesion in the dome of the liver, not well seen previously. Lesion is too small to characterize. Further evaluation could be performed with MR abdomen without and with contrast, as clinically indicated. 3. Hepatic steatosis. 4. Aortic atherosclerosis (ICD10-I70.0). Coronary artery calcification.     03/20/2020 Imaging   Whole body bone scan  IMPRESSION: 1. Increased activity noted over the right breast, possibly related to the patient's right breast cancer.   2. Punctate area of increased activity over the left maxillary region, most likely related to sinus disease or dental disease. Increased activity noted over the left mandible also possibly related to dental disease. No other focal bony abnormalities to suggest metastatic disease identified.   03/22/2020 Procedure   PAC placement by Dr [name]    03/22/2020 -  Chemotherapy   AC q2weeks for 4 cycles starting 03/22/20 followed by weekly Taxol for 12 weeks.   03/22/2020 Pathology Results   Left breast skin Punch Biopsy PENDING       CURRENT THERAPY:  AC q2weeks for 4 cycles starting 03/22/20 followed by weekly Taxol for 12 weeks.  INTERVAL HISTORY:  Victoria Coffey is here for a follow up and treatment. She presents to the clinic alone. She notes her PAC placement went well. She notes new changes since last visit and no breast changes. She notes she has tried to have MRI 3 times due to issues with visuals on her right breast. She notes occasional movement of her left breast when she sleeps on or moves on her left side. She notes  she is drinking gallon of water this past week.    REVIEW OF SYSTEMS:   Constitutional: Denies fevers, chills or abnormal weight loss Eyes: Denies blurriness of vision Ears, nose, mouth, throat, and face: Denies mucositis or sore throat Respiratory: Denies cough, dyspnea or wheezes Cardiovascular: Denies palpitation, chest discomfort or lower extremity swelling Gastrointestinal:  Denies nausea, heartburn or change in bowel habits Skin: Denies abnormal skin rashes Lymphatics: Denies new lymphadenopathy or easy bruising Neurological:Denies numbness, tingling or new weaknesses Behavioral/Psych: Mood is stable, no new changes  All other systems were reviewed with the patient and are negative.  MEDICAL HISTORY:  Past Medical History:  Diagnosis Date  . Breast cancer ([place]) dx'd 1993   left  . History of left breast cancer   . HIV infection ([place])   . Hypertension     SURGICAL HISTORY: Past Surgical History:  Procedure Laterality Date  . BREAST LUMPECTOMY    . CESAREAN SECTION    . left breast lumpectomy  1993    I have reviewed the social history and family history with the patient and they are unchanged from previous note.  ALLERGIES:  is allergic to codeine; other; grapefruit bioflavonoid complex; pomegranate [punica]; and shellfish allergy.  MEDICATIONS:  Current Outpatient Medications  Medication Sig Dispense Refill  . acetaminophen (TYLENOL) 500 MG tablet Take 1,000 mg by mouth every 6 (six) hours as needed for  moderate pain.    Marland Kitchen emtricitabine-rilpivir-tenofovir AF (ODEFSEY) 200-25-25 MG TABS tablet Take 1 tablet by mouth daily with breakfast. 30 tablet 11  . hydrochlorothiazide (HYDRODIURIL) 25 MG tablet Take 1 tablet (25 mg total) by mouth daily. 30 tablet 11  . lidocaine-prilocaine (EMLA) cream Apply to affected area once 30 g 3  . ondansetron (ZOFRAN) 8 MG tablet Take 1 tablet (8 mg total) by mouth 2 (two) times daily as needed. Start on the third day after  chemotherapy. 30 tablet 1  . oxyCODONE (OXY IR/ROXICODONE) 5 MG immediate release tablet Take 1 tablet (5 mg total) by mouth every 6 (six) hours as needed for severe pain. 15 tablet 0  . prochlorperazine (COMPAZINE) 10 MG tablet Take 1 tablet (10 mg total) by mouth every 6 (six) hours as needed (Nausea or vomiting). 30 tablet 1   No current facility-administered medications for this visit.    PHYSICAL EXAMINATION: ECOG PERFORMANCE STATUS: 1 - Symptomatic but completely ambulatory  There were no vitals filed for this visit. There were no vitals filed for this visit.  Due to [place] we will limit examination to appearance. Patient had no complaints.  GENERAL:alert, no distress and comfortable SKIN: skin color normal, no rashes or significant lesions EYES: normal, Conjunctiva are pink and non-injected, sclera clear  NEURO: alert & oriented x 3 with fluent speech   LABORATORY DATA:  I have reviewed the data as listed CBC Latest Ref Rng & Units 03/22/2020 03/19/2020 03/06/2020  WBC 4.0 - 10.5 K/uL 4.2 4.5 4.4  Hemoglobin 12.0 - 15.0 g/dL 12.7 13.9 13.3  Hematocrit 36.0 - 46.0 % 38.9 43.5 40.5  Platelets 150 - 400 K/uL 324 367 288     CMP Latest Ref Rng & Units 03/22/2020 03/19/2020 03/06/2020  Glucose 70 - 99 mg/dL 129(H) 107(H) 102(H)  BUN 8 - 23 mg/dL _0 Creatinine 0.44 - 1.00 mg/dL 0.89 0.91 0.87  Sodium 135 - 145 mmol/L 140 140 138  Potassium 3.5 - 5.1 mmol/L 3.5 3.7 3.4(L)  Chloride 98 - 111 mmol/L 104 102 103  CO2 22 - 32 mmol/L _1 Calcium 8.9 - 10.3 mg/dL 9.3 9.5 9.3  Total Protein 6.5 - 8.1 g/dL 7.7 - 8.0  Total Bilirubin 0.3 - 1.2 mg/dL <0.2(L) - 0.3  Alkaline Phos 38 - 126 U/L 172(H) - 213(H)  AST 15 - 41 U/L 48(H) - 46(H)  ALT 0 - 44 U/L 52(H) - 61(H)      RADIOGRAPHIC STUDIES: I have personally reviewed the radiological images as listed and agreed with the findings in the report. DG CHEST PORT 1 VIEW  Result Date: 03/22/2020 CLINICAL DATA:  Port-A-Cath  insertion.  Breast carcinoma EXAM: PORTABLE CHEST 1 VIEW COMPARISON:  Chest CT Mar 20, 2020 FINDINGS: Port-A-Cath tip is in the superior vena cava. No pneumothorax. Lungs are clear. Heart size and pulmonary vascularity are normal. No adenopathy. Surgical clips are noted in the left axilla. No bone lesions. IMPRESSION: Port-A-Cath tip in superior vena cava. No pneumothorax. Lungs clear. Postoperative change on the left. Cardiac silhouette within normal limits. Electronically Signed   By: Lowella Grip III M.D.   On: 03/22/2020 09:32   DG Fluoro Guide CV Line-No Report  Result Date: 03/22/2020 Fluoroscopy was utilized by the requesting physician.  No radiographic interpretation.     ASSESSMENT & PLAN:  BRYNN MULGREW is a 64 y.o. female with    1. Malignant neoplasm of upper-outer quadrant of left breast, [id number],  possible stage IV with contralateral axillary node metastasis, ER weakly+, PR/HER2-, Grade III -She was recently diagnosed with left breast cancer in 02/2020. Her Mammogram showed an at least 4cm mass of left breast. Based on initial exam she probably has skin involvement and her entire left breast is occupied by cancer. Her mammogram also showed right LN enlargement. 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 It is not definitive if her right LN are related to her left breast cancer, or if she has a second right breast cancer   -Her breast MRI from 03/18/20 has not been read yet for further right breast and axillary evaluation. She may need additional biopsy based on results.  -I discussed her CT CAP and bone scan from 03/20/20 in great detail. Bone scan showed increased activity over the right breast, which will be better seen on pending MRI. Bone scan found Incidental findings of uptake in her jaw and sinus region likely dental related. Her CT scan shows 15m low density liver lesion, which appears benign to me but indeterminate. I recommend  MRI of liver for better imagine, She is agreeable.  -If her right LN is from her left breast cancer, her cancer would be stage IV but may still be curative with multidisciplinary treatment if her left breast cancer is resectable after neoadjuvant chemotherapy. -Given the likely inflammatory left breast cancer, upfront surgery with left mastectomy and right LN dissection is not offered at this time. She did have Punch biopsy done of her left breast today (03/22/20), results are pending.  -I recommend chemotherapy as first-line treatment with Adriamycin and Cytoxan q2weeks for 4 cycles, followed by weekly carboplatin and Taxol for 12 weeks to downstage her cancer and hopefully she will become a candidate for surgical resection. [facility]  -I spoke with Dr. KSonny Dandy she probably got CMF in 1993 for her breast cancer  -She had her PAC placed on 03/22/20. Baseline ECHO normal.  -Lab reviewed and adequately to proceed with cycle 1 AC today. I reviewed possible side effects and antiemetic use.  -f/u in 2 weeks for cycle 2.    2. H/o Left breast cancer, Dx in 1993 -[facility] [place] [facility] KSonny Dandy[facility] surgeon Dr BWeyman Rodney She was treated with left lumpectomy with SNLB and cancer was in the LN. She denies lymphedema. She had adjuvant chemo (probably CMF), Adjuvant Radiation and Tamoxifen for 5 years.    3. Genetic Testing  -She has not family history of cancer but this is her second breast cancer so she is eligible for genetic testing. She is agreeable. I will send referral.  -Her husband has a high rate of breast cancer in his family. I recommend their daughter starts breast Mammogram, and if needed MRIs, given the early age of the patient's first breast cancer.    4. Comorbidities: HIV(+), HTN -She was diagnosed in 1993. Her HIV has been undetectable and well controlled. She is currently being seen by ID Dr SGraylon Good -She is on HCTZ for her HTN  5.  Mild  transaminitis -Unclear etiology, presents before chemotherapy -We will continue monitoring   PLAN:  -Liver MRI in 1-2 weeks  -Breast MRI and left breast Punch biopsy is still pending.  -Labs reviewed and adequate to proceed with C1 AC today  -Lab, flush, f/u and AC in 2 weeks    No problem-specific Assessment & Plan notes found for this encounter.  Orders Placed This Encounter  Procedures  . MR Abdomen W Wo Contrast    Standing Status:   Future    Standing Expiration Date:   03/22/2021    Order Specific Question:   If indicated for the ordered procedure, I authorize the administration of contrast media per Radiology protocol    Answer:   Yes    Order Specific Question:   What is the patient's sedation requirement?    Answer:   No Sedation    Order Specific Question:   Does the patient have a pacemaker or implanted devices?    Answer:   No    Order Specific Question:   Radiology Contrast Protocol - do NOT remove file path    Answer:   [email or web address]    Order Specific Question:   Preferred imaging location?    Answer:   [facility] (table limit - 550 lbs)    Order Specific Question:   Release to patient    Answer:   Immediate   All questions were answered. The patient knows to call the clinic with any problems, questions or concerns. No barriers to learning was detected. The total time spent in the appointment was 35 minutes.     Victoria Merle, MD 03/22/2020   I, Joslyn Devon, am acting as scribe for Victoria Merle, MD.   I have reviewed the above documentation for accuracy and completeness, and I agree with the above.

## 2020-03-21 NOTE — Anesthesia Preprocedure Evaluation (Addendum)
Anesthesia Evaluation  Patient identified by MRN, date of birth, ID band Patient awake    Reviewed: Allergy & Precautions, NPO status , Patient's Chart, lab work & pertinent test results  Airway Mallampati: II  TM Distance: >3 FB Neck ROM: Full    Dental no notable dental hx. (+) Teeth Intact   Pulmonary neg pulmonary ROS,    Pulmonary exam normal breath sounds clear to auscultation       Cardiovascular hypertension, Pt. on medications Normal cardiovascular exam Rhythm:Regular Rate:Normal     Neuro/Psych negative neurological ROS  negative psych ROS   GI/Hepatic negative GI ROS, Neg liver ROS,   Endo/Other  Obesity Breast Ca  Renal/GU negative Renal ROS  negative genitourinary   Musculoskeletal negative musculoskeletal ROS (+)   Abdominal (+) + obese,   Peds  Hematology  (+) HIV,   Anesthesia Other Findings   Reproductive/Obstetrics                            Anesthesia Physical Anesthesia Plan  ASA: II  Anesthesia Plan: General   Post-op Pain Management:    Induction: Intravenous  PONV Risk Score and Plan: 4 or greater and Midazolam, Ondansetron, Dexamethasone and Treatment may vary due to age or medical condition  Airway Management Planned: LMA  Additional Equipment:   Intra-op Plan:   Post-operative Plan: Extubation in OR  Informed Consent: I have reviewed the patients History and Physical, chart, labs and discussed the procedure including the risks, benefits and alternatives for the proposed anesthesia with the patient or authorized representative who has indicated his/her understanding and acceptance.     Dental advisory given  Plan Discussed with: CRNA and Surgeon  Anesthesia Plan Comments:        Anesthesia Quick Evaluation

## 2020-03-22 ENCOUNTER — Encounter (HOSPITAL_COMMUNITY)
Admission: RE | Disposition: A | Discharge status: Home / Self Care | Payer: Self-pay | Source: Ambulatory Visit | Attending: General Surgery

## 2020-03-22 ENCOUNTER — Ambulatory Visit (HOSPITAL_COMMUNITY): Payer: BC Managed Care – PPO

## 2020-03-22 ENCOUNTER — Encounter: Payer: Self-pay | Admitting: *Deleted

## 2020-03-22 ENCOUNTER — Encounter: Payer: Self-pay | Admitting: Hematology

## 2020-03-22 ENCOUNTER — Encounter (HOSPITAL_COMMUNITY): Payer: Self-pay | Admitting: General Surgery

## 2020-03-22 ENCOUNTER — Inpatient Hospital Stay: Payer: BC Managed Care – PPO

## 2020-03-22 ENCOUNTER — Other Ambulatory Visit: Payer: BC Managed Care – PPO

## 2020-03-22 ENCOUNTER — Ambulatory Visit (HOSPITAL_COMMUNITY): Payer: BC Managed Care – PPO | Admitting: Vascular Surgery

## 2020-03-22 ENCOUNTER — Ambulatory Visit: Payer: BC Managed Care – PPO | Admitting: Hematology

## 2020-03-22 ENCOUNTER — Other Ambulatory Visit: Payer: Self-pay

## 2020-03-22 ENCOUNTER — Ambulatory Visit (HOSPITAL_COMMUNITY)
Admission: RE | Admit: 2020-03-22 | Discharge: 2020-03-22 | Disposition: A | Discharge status: Home / Self Care | Payer: BC Managed Care – PPO | Source: Ambulatory Visit | Attending: General Surgery | Admitting: General Surgery

## 2020-03-22 ENCOUNTER — Ambulatory Visit (HOSPITAL_COMMUNITY): Payer: BC Managed Care – PPO | Admitting: Certified Registered Nurse Anesthetist

## 2020-03-22 ENCOUNTER — Ambulatory Visit: Payer: BC Managed Care – PPO

## 2020-03-22 ENCOUNTER — Inpatient Hospital Stay (HOSPITAL_BASED_OUTPATIENT_CLINIC_OR_DEPARTMENT_OTHER): Payer: BC Managed Care – PPO | Admitting: Hematology

## 2020-03-22 VITALS — BP 129/78 | HR 76 | Temp 98.1°F | Resp 16

## 2020-03-22 DIAGNOSIS — Z170 Estrogen receptor positive status [ER+]: Secondary | ICD-10-CM | POA: Insufficient documentation

## 2020-03-22 DIAGNOSIS — Z853 Personal history of malignant neoplasm of breast: Secondary | ICD-10-CM

## 2020-03-22 DIAGNOSIS — C50112 Malignant neoplasm of central portion of left female breast: Secondary | ICD-10-CM | POA: Insufficient documentation

## 2020-03-22 DIAGNOSIS — E669 Obesity, unspecified: Secondary | ICD-10-CM | POA: Insufficient documentation

## 2020-03-22 DIAGNOSIS — Z21 Asymptomatic human immunodeficiency virus [HIV] infection status: Secondary | ICD-10-CM | POA: Insufficient documentation

## 2020-03-22 DIAGNOSIS — Z6830 Body mass index (BMI) 30.0-30.9, adult: Secondary | ICD-10-CM | POA: Insufficient documentation

## 2020-03-22 DIAGNOSIS — C773 Secondary and unspecified malignant neoplasm of axilla and upper limb lymph nodes: Secondary | ICD-10-CM | POA: Insufficient documentation

## 2020-03-22 DIAGNOSIS — B20 Human immunodeficiency virus [HIV] disease: Secondary | ICD-10-CM

## 2020-03-22 DIAGNOSIS — Z9221 Personal history of antineoplastic chemotherapy: Secondary | ICD-10-CM | POA: Insufficient documentation

## 2020-03-22 DIAGNOSIS — Z95828 Presence of other vascular implants and grafts: Secondary | ICD-10-CM

## 2020-03-22 DIAGNOSIS — C50412 Malignant neoplasm of upper-outer quadrant of left female breast: Secondary | ICD-10-CM | POA: Diagnosis not present

## 2020-03-22 DIAGNOSIS — I10 Essential (primary) hypertension: Secondary | ICD-10-CM | POA: Diagnosis not present

## 2020-03-22 DIAGNOSIS — Z923 Personal history of irradiation: Secondary | ICD-10-CM | POA: Insufficient documentation

## 2020-03-22 DIAGNOSIS — K769 Liver disease, unspecified: Secondary | ICD-10-CM

## 2020-03-22 DIAGNOSIS — C50911 Malignant neoplasm of unspecified site of right female breast: Secondary | ICD-10-CM | POA: Insufficient documentation

## 2020-03-22 HISTORY — PX: PORTACATH PLACEMENT: SHX2246

## 2020-03-22 HISTORY — PX: BREAST BIOPSY: SHX20

## 2020-03-22 LAB — CMP (CANCER CENTER ONLY)
ALT: 52 U/L — ABNORMAL HIGH (ref 0–44)
AST: 48 U/L — ABNORMAL HIGH (ref 15–41)
Albumin: 3.6 g/dL (ref 3.5–5.0)
Alkaline Phosphatase: 172 U/L — ABNORMAL HIGH (ref 38–126)
Anion gap: 9 (ref 5–15)
BUN: 9 mg/dL (ref 8–23)
CO2: 27 mmol/L (ref 22–32)
Calcium: 9.3 mg/dL (ref 8.9–10.3)
Chloride: 104 mmol/L (ref 98–111)
Creatinine: 0.89 mg/dL (ref 0.44–1.00)
GFR, Est AFR Am: >60 mL/min (ref >60)
GFR, Estimated: >60 mL/min (ref >60)
Glucose, Bld: 129 mg/dL — ABNORMAL HIGH (ref 70–99)
Potassium: 3.5 mmol/L (ref 3.5–5.1)
Sodium: 140 mmol/L (ref 135–145)
Total Bilirubin: <0.2 mg/dL — ABNORMAL LOW (ref 0.3–1.2)
Total Protein: 7.7 g/dL (ref 6.5–8.1)

## 2020-03-22 LAB — CBC WITH DIFFERENTIAL (CANCER CENTER ONLY)
Abs Immature Granulocytes: 0 10*3/uL (ref 0.00–0.07)
Basophils Absolute: 0 10*3/uL (ref 0.0–0.1)
Basophils Relative: 0 %
Eosinophils Absolute: 0 10*3/uL (ref 0.0–0.5)
Eosinophils Relative: 0 %
HCT: 38.9 % (ref 36.0–46.0)
Hemoglobin: 12.7 g/dL (ref 12.0–15.0)
Immature Granulocytes: 0 %
Lymphocytes Relative: 12 %
Lymphs Abs: 0.5 10*3/uL — ABNORMAL LOW (ref 0.7–4.0)
MCH: 28 pg (ref 26.0–34.0)
MCHC: 32.6 g/dL (ref 30.0–36.0)
MCV: 85.9 fL (ref 80.0–100.0)
Monocytes Absolute: 0.1 10*3/uL (ref 0.1–1.0)
Monocytes Relative: 2 %
Neutro Abs: 3.6 10*3/uL (ref 1.7–7.7)
Neutrophils Relative %: 86 %
Platelet Count: 324 10*3/uL (ref 150–400)
RBC: 4.53 MIL/uL (ref 3.87–5.11)
RDW: 15.8 % — ABNORMAL HIGH (ref 11.5–15.5)
WBC Count: 4.2 10*3/uL (ref 4.0–10.5)
nRBC: 0 % (ref 0.0–0.2)

## 2020-03-22 SURGERY — INSERTION, TUNNELED CENTRAL VENOUS DEVICE, WITH PORT
Anesthesia: General | Site: Chest | Laterality: Right

## 2020-03-22 MED ORDER — ENSURE PRE-SURGERY PO LIQD: 296.0000 mL | Freq: Once | ORAL | Status: DC

## 2020-03-22 MED ORDER — ORAL CARE MOUTH RINSE: 15.0000 mL | Freq: Once | OROMUCOSAL | Status: AC

## 2020-03-22 MED ORDER — LIDOCAINE-EPINEPHRINE 1 %-1:100000 IJ SOLN
INTRAMUSCULAR | Status: DC | PRN
  Administered 2020-03-22: 12 mL

## 2020-03-22 MED ORDER — CHLORHEXIDINE GLUCONATE CLOTH 2 % EX PADS: 6.0000 | MEDICATED_PAD | Freq: Once | CUTANEOUS | Status: DC

## 2020-03-22 MED ORDER — LIDOCAINE 2% (20 MG/ML) 5 ML SYRINGE
INTRAMUSCULAR | Status: DC | PRN
  Administered 2020-03-22: 40 mg via INTRAVENOUS

## 2020-03-22 MED ORDER — PROPOFOL 10 MG/ML IV BOLUS
INTRAVENOUS | Status: AC
  Filled 2020-03-22: qty 40

## 2020-03-22 MED ORDER — OXYCODONE HCL 5 MG PO TABS
5.0000 mg | ORAL_TABLET | Freq: Four times a day (QID) | ORAL | 0 refills | Status: DC | PRN
Start: 2020-03-22 — End: 2020-09-18

## 2020-03-22 MED ORDER — ONDANSETRON HCL 4 MG/2ML IJ SOLN: 4.0000 mg | Freq: Once | INTRAMUSCULAR | Status: DC | PRN

## 2020-03-22 MED ORDER — PHENYLEPHRINE 40 MCG/ML (10ML) SYRINGE FOR IV PUSH (FOR BLOOD PRESSURE SUPPORT)
PREFILLED_SYRINGE | INTRAVENOUS | Status: AC
  Filled 2020-03-22: qty 10

## 2020-03-22 MED ORDER — SODIUM CHLORIDE 0.9% FLUSH
10.0000 mL | INTRAVENOUS | Status: DC | PRN
  Administered 2020-03-22: 10 mL via INTRAVENOUS
  Filled 2020-03-22: qty 10

## 2020-03-22 MED ORDER — CEFAZOLIN SODIUM-DEXTROSE 2-4 GM/100ML-% IV SOLN
2.0000 g | INTRAVENOUS | Status: AC
  Administered 2020-03-22: 2 g via INTRAVENOUS
  Filled 2020-03-22: qty 100

## 2020-03-22 MED ORDER — FENTANYL CITRATE (PF) 100 MCG/2ML IJ SOLN: 25.0000 ug | INTRAMUSCULAR | Status: DC | PRN

## 2020-03-22 MED ORDER — SODIUM CHLORIDE 0.9 % IV SOLN
600.0000 mg/m2 | Freq: Once | INTRAVENOUS | Status: AC
  Administered 2020-03-22: 1080 mg via INTRAVENOUS
  Filled 2020-03-22: qty 54

## 2020-03-22 MED ORDER — SCOPOLAMINE 1 MG/3DAYS TD PT72
MEDICATED_PATCH | TRANSDERMAL | Status: AC
  Filled 2020-03-22: qty 1

## 2020-03-22 MED ORDER — HEPARIN SOD (PORK) LOCK FLUSH 100 UNIT/ML IV SOLN
INTRAVENOUS | Status: AC
  Filled 2020-03-22: qty 5

## 2020-03-22 MED ORDER — SODIUM CHLORIDE 0.9 % IV SOLN
INTRAVENOUS | Status: DC | PRN
  Administered 2020-03-22: 500 mL

## 2020-03-22 MED ORDER — HEPARIN SOD (PORK) LOCK FLUSH 100 UNIT/ML IV SOLN
500.0000 [IU] | Freq: Once | INTRAVENOUS | Status: AC | PRN
  Administered 2020-03-22: 500 [IU]
  Filled 2020-03-22: qty 5

## 2020-03-22 MED ORDER — PHENYLEPHRINE HCL (PRESSORS) 10 MG/ML IV SOLN
INTRAVENOUS | Status: DC | PRN
  Administered 2020-03-22: 80 ug via INTRAVENOUS
  Administered 2020-03-22: 120 ug via INTRAVENOUS
  Administered 2020-03-22: 40 ug via INTRAVENOUS
  Administered 2020-03-22 (×2): 80 ug via INTRAVENOUS

## 2020-03-22 MED ORDER — OXYCODONE HCL 5 MG/5ML PO SOLN: 5.0000 mg | Freq: Once | ORAL | Status: DC | PRN

## 2020-03-22 MED ORDER — FENTANYL CITRATE (PF) 250 MCG/5ML IJ SOLN
INTRAMUSCULAR | Status: DC | PRN
  Administered 2020-03-22: 50 ug via INTRAVENOUS
  Administered 2020-03-22: 25 ug via INTRAVENOUS

## 2020-03-22 MED ORDER — MIDAZOLAM HCL 5 MG/5ML IJ SOLN
INTRAMUSCULAR | Status: DC | PRN
  Administered 2020-03-22: 1 mg via INTRAVENOUS

## 2020-03-22 MED ORDER — EPHEDRINE SULFATE-NACL 50-0.9 MG/10ML-% IV SOSY
PREFILLED_SYRINGE | INTRAVENOUS | Status: DC | PRN
  Administered 2020-03-22 (×2): 5 mg via INTRAVENOUS

## 2020-03-22 MED ORDER — 0.9 % SODIUM CHLORIDE (POUR BTL) OPTIME
TOPICAL | Status: DC | PRN
  Administered 2020-03-22: 1000 mL

## 2020-03-22 MED ORDER — CHLORHEXIDINE GLUCONATE 0.12 % MT SOLN
15.0000 mL | Freq: Once | OROMUCOSAL | Status: AC
  Administered 2020-03-22: 15 mL via OROMUCOSAL
  Filled 2020-03-22: qty 15

## 2020-03-22 MED ORDER — PROPOFOL 10 MG/ML IV BOLUS
INTRAVENOUS | Status: DC | PRN
  Administered 2020-03-22: 150 mg via INTRAVENOUS

## 2020-03-22 MED ORDER — HEPARIN SOD (PORK) LOCK FLUSH 100 UNIT/ML IV SOLN
INTRAVENOUS | Status: DC | PRN
  Administered 2020-03-22: 500 [IU]

## 2020-03-22 MED ORDER — PALONOSETRON HCL INJECTION 0.25 MG/5ML
0.2500 mg | Freq: Once | INTRAVENOUS | Status: AC
  Administered 2020-03-22: 0.25 mg via INTRAVENOUS

## 2020-03-22 MED ORDER — BUPIVACAINE HCL (PF) 0.25 % IJ SOLN
INTRAMUSCULAR | Status: AC
  Filled 2020-03-22: qty 30

## 2020-03-22 MED ORDER — LIDOCAINE 2% (20 MG/ML) 5 ML SYRINGE
INTRAMUSCULAR | Status: AC
  Filled 2020-03-22: qty 5

## 2020-03-22 MED ORDER — SODIUM CHLORIDE 0.9 % IV SOLN
INTRAVENOUS | Status: AC
  Filled 2020-03-22: qty 1.2

## 2020-03-22 MED ORDER — FENTANYL CITRATE (PF) 250 MCG/5ML IJ SOLN
INTRAMUSCULAR | Status: AC
  Filled 2020-03-22: qty 5

## 2020-03-22 MED ORDER — SODIUM CHLORIDE 0.9 % IV SOLN
150.0000 mg | Freq: Once | INTRAVENOUS | Status: AC
  Administered 2020-03-22: 150 mg via INTRAVENOUS
  Filled 2020-03-22: qty 150

## 2020-03-22 MED ORDER — ONDANSETRON HCL 4 MG/2ML IJ SOLN
INTRAMUSCULAR | Status: AC
  Filled 2020-03-22: qty 2

## 2020-03-22 MED ORDER — LIDOCAINE-EPINEPHRINE 1 %-1:100000 IJ SOLN
INTRAMUSCULAR | Status: AC
  Filled 2020-03-22: qty 1

## 2020-03-22 MED ORDER — ONDANSETRON HCL 4 MG/2ML IJ SOLN
INTRAMUSCULAR | Status: DC | PRN
  Administered 2020-03-22: 4 mg via INTRAVENOUS

## 2020-03-22 MED ORDER — ACETAMINOPHEN 500 MG PO TABS
1000.0000 mg | ORAL_TABLET | ORAL | Status: DC
  Filled 2020-03-22: qty 2

## 2020-03-22 MED ORDER — PALONOSETRON HCL INJECTION 0.25 MG/5ML
INTRAVENOUS | Status: AC
  Filled 2020-03-22: qty 5

## 2020-03-22 MED ORDER — SODIUM CHLORIDE 0.9 % IV SOLN
10.0000 mg | Freq: Once | INTRAVENOUS | Status: AC
  Administered 2020-03-22: 10 mg via INTRAVENOUS
  Filled 2020-03-22: qty 10

## 2020-03-22 MED ORDER — DOXORUBICIN HCL CHEMO IV INJECTION 2 MG/ML
60.0000 mg/m2 | Freq: Once | INTRAVENOUS | Status: AC
  Administered 2020-03-22: 108 mg via INTRAVENOUS
  Filled 2020-03-22: qty 54

## 2020-03-22 MED ORDER — OXYCODONE HCL 5 MG PO TABS: 5.0000 mg | ORAL_TABLET | Freq: Once | ORAL | Status: DC | PRN

## 2020-03-22 MED ORDER — DEXAMETHASONE SODIUM PHOSPHATE 10 MG/ML IJ SOLN
INTRAMUSCULAR | Status: AC
  Filled 2020-03-22: qty 1

## 2020-03-22 MED ORDER — SODIUM CHLORIDE 0.9 % IV SOLN
Freq: Once | INTRAVENOUS | Status: AC
  Filled 2020-03-22: qty 250

## 2020-03-22 MED ORDER — LACTATED RINGERS IV SOLN: INTRAVENOUS | Status: DC | PRN

## 2020-03-22 MED ORDER — MIDAZOLAM HCL 2 MG/2ML IJ SOLN
INTRAMUSCULAR | Status: AC
  Filled 2020-03-22: qty 2

## 2020-03-22 MED ORDER — DEXAMETHASONE SODIUM PHOSPHATE 10 MG/ML IJ SOLN
INTRAMUSCULAR | Status: DC | PRN
  Administered 2020-03-22: 4 mg via INTRAVENOUS

## 2020-03-22 MED ORDER — SODIUM CHLORIDE 0.9% FLUSH
10.0000 mL | INTRAVENOUS | Status: DC | PRN
  Administered 2020-03-22: 10 mL
  Filled 2020-03-22: qty 10

## 2020-03-22 MED ORDER — SCOPOLAMINE 1 MG/3DAYS TD PT72
1.0000 | MEDICATED_PATCH | TRANSDERMAL | Status: DC
  Administered 2020-03-22: 1 via TRANSDERMAL

## 2020-03-22 SURGICAL SUPPLY — 72 items
ADH SKN CLS APL DERMABOND .7 (GAUZE/BANDAGES/DRESSINGS) ×2
APL PRP STRL LF DISP 70% ISPRP (MISCELLANEOUS) ×2
BAG DECANTER FOR FLEXI CONT (MISCELLANEOUS) ×4 IMPLANT
BINDER BREAST LRG (GAUZE/BANDAGES/DRESSINGS) IMPLANT
BINDER BREAST XLRG (GAUZE/BANDAGES/DRESSINGS) IMPLANT
BLADE SURG 10 STRL SS (BLADE) ×4 IMPLANT
BLADE SURG 11 STRL SS (BLADE) ×2 IMPLANT
BLADE SURG 15 STRL LF DISP TIS (BLADE) IMPLANT
BLADE SURG 15 STRL SS (BLADE) ×4
CANISTER SUCT 3000ML PPV (MISCELLANEOUS) ×4 IMPLANT
CHLORAPREP W/TINT 26 (MISCELLANEOUS) ×4 IMPLANT
CLIP VESOCCLUDE MED 6/CT (CLIP) ×4 IMPLANT
CLIP VESOCCLUDE SM WIDE 6/CT (CLIP) ×2 IMPLANT
CLOSURE WOUND 1/2 X4 (GAUZE/BANDAGES/DRESSINGS)
CNTNR URN SCR LID CUP LEK RST (MISCELLANEOUS) IMPLANT
CONT SPEC 4OZ STRL OR WHT (MISCELLANEOUS) ×4
COVER SURGICAL LIGHT HANDLE (MISCELLANEOUS) ×4 IMPLANT
COVER TRANSDUCER ULTRASND GEL (DRAPE) ×2 IMPLANT
COVER WAND RF STERILE (DRAPES) ×4 IMPLANT
DECANTER SPIKE VIAL GLASS SM (MISCELLANEOUS) ×8 IMPLANT
DERMABOND ADVANCED (GAUZE/BANDAGES/DRESSINGS) ×2
DERMABOND ADVANCED .7 DNX12 (GAUZE/BANDAGES/DRESSINGS) ×2 IMPLANT
DEVICE DUBIN SPECIMEN MAMMOGRA (MISCELLANEOUS) IMPLANT
DRAPE C-ARM 42X120 X-RAY (DRAPES) ×4 IMPLANT
DRAPE CHEST BREAST 15X10 FENES (DRAPES) ×4 IMPLANT
DRAPE LAPAROTOMY 100X72 PEDS (DRAPES) ×4 IMPLANT
DRAPE WARM FLUID 44X44 (DRAPES) IMPLANT
DRSG TEGADERM 4X4.75 (GAUZE/BANDAGES/DRESSINGS) ×2 IMPLANT
ELECT COATED BLADE 2.86 ST (ELECTRODE) ×4 IMPLANT
ELECT REM PT RETURN 9FT ADLT (ELECTROSURGICAL) ×4
ELECTRODE REM PT RTRN 9FT ADLT (ELECTROSURGICAL) ×2 IMPLANT
GAUZE 4X4 16PLY RFD (DISPOSABLE) ×4 IMPLANT
GAUZE SPONGE 4X4 12PLY STRL (GAUZE/BANDAGES/DRESSINGS) ×2 IMPLANT
GEL ULTRASOUND 20GR AQUASONIC (MISCELLANEOUS) IMPLANT
GLOVE BIO SURGEON STRL SZ 6 (GLOVE) ×4 IMPLANT
GLOVE INDICATOR 6.5 STRL GRN (GLOVE) ×4 IMPLANT
GOWN STRL REUS W/ TWL LRG LVL3 (GOWN DISPOSABLE) ×2 IMPLANT
GOWN STRL REUS W/TWL 2XL LVL3 (GOWN DISPOSABLE) ×8 IMPLANT
GOWN STRL REUS W/TWL LRG LVL3 (GOWN DISPOSABLE) ×4
ILLUMINATOR WAVEGUIDE N/F (MISCELLANEOUS) IMPLANT
KIT BASIN OR (CUSTOM PROCEDURE TRAY) ×4 IMPLANT
KIT MARKER MARGIN INK (KITS) IMPLANT
KIT PORT POWER 8FR ISP CVUE (Port) ×2 IMPLANT
KIT TURNOVER KIT B (KITS) ×4 IMPLANT
LIGHT WAVEGUIDE WIDE FLAT (MISCELLANEOUS) IMPLANT
NDL HYPO 25GX1X1/2 BEV (NEEDLE) ×2 IMPLANT
NEEDLE 22X1 1/2 (OR ONLY) (NEEDLE) ×4 IMPLANT
NEEDLE HYPO 25GX1X1/2 BEV (NEEDLE) ×4 IMPLANT
NS IRRIG 1000ML POUR BTL (IV SOLUTION) ×4 IMPLANT
PACK GENERAL/GYN (CUSTOM PROCEDURE TRAY) ×4 IMPLANT
PAD ARMBOARD 7.5X6 YLW CONV (MISCELLANEOUS) ×8 IMPLANT
PENCIL BUTTON HOLSTER BLD 10FT (ELECTRODE) ×4 IMPLANT
PENCIL SMOKE EVACUATOR (MISCELLANEOUS) ×4 IMPLANT
POSITIONER HEAD DONUT 9IN (MISCELLANEOUS) ×4 IMPLANT
PUNCH BIOPSY DERMAL 6MM STRL (MISCELLANEOUS) ×4 IMPLANT
SPONGE LAP 18X18 RF (DISPOSABLE) ×4 IMPLANT
STRIP CLOSURE SKIN 1/2X4 (GAUZE/BANDAGES/DRESSINGS) IMPLANT
SUT MNCRL AB 4-0 PS2 18 (SUTURE) ×4 IMPLANT
SUT MON AB 4-0 PC3 18 (SUTURE) ×4 IMPLANT
SUT PROLENE 2 0 SH DA (SUTURE) ×12 IMPLANT
SUT SILK 2 0 PERMA HAND 18 BK (SUTURE) IMPLANT
SUT VIC AB 3-0 SH 27 (SUTURE) ×4
SUT VIC AB 3-0 SH 27X BRD (SUTURE) ×2 IMPLANT
SYR 20ML LL LF (SYRINGE) ×2 IMPLANT
SYR 5ML LUER SLIP (SYRINGE) ×6 IMPLANT
SYR CONTROL 10ML LL (SYRINGE) ×4 IMPLANT
TOWEL GREEN STERILE (TOWEL DISPOSABLE) ×4 IMPLANT
TOWEL GREEN STERILE FF (TOWEL DISPOSABLE) ×4 IMPLANT
TRAY LAPAROSCOPIC MC (CUSTOM PROCEDURE TRAY) ×4 IMPLANT
TUBE CONNECTING 12'X1/4 (SUCTIONS)
TUBE CONNECTING 12X1/4 (SUCTIONS) IMPLANT
YANKAUER SUCT BULB TIP NO VENT (SUCTIONS) IMPLANT

## 2020-03-22 NOTE — Op Note (Addendum)
PREOPERATIVE DIAGNOSIS:  Bilateral breast cancer, concern for inflammatory cancer bilaterally.     POSTOPERATIVE DIAGNOSIS:  Same     PROCEDURE: Right subclavian port placement, Bard ClearVue Power Port, MRI safe, 8-French, punch biopsies bilateral breasts.      SURGEON:  [name], MD      ANESTHESIA:  General   FINDINGS:  Good venous return, easy flush, and tip of the catheter and   SVC 16 cm.      SPECIMEN:  None.      ESTIMATED BLOOD LOSS:  Minimal.      COMPLICATIONS:  None known.      PROCEDURE:  Pt was identified in the holding area and taken to   the operating room, where patient was placed supine on the operating room   table.  General anesthesia was induced.  Patient's arms were tucked and the upper   chest and neck were prepped and draped in sterile fashion.  Time-out was   performed according to the surgical safety check list.  When all was   correct, we continued.    The 4 mm punch biopsies were used to biopsy the skin of both breasts.  The right side was done first. The lateral skin around 9 o'clock was biopsied first.  A single 4-0 monocryl was used to close the defect.  The left side was then addressed with a separate punch.  This was done around 1 o'clock.  Both specimens were submitted to pathology.  The left side did not require a stitch.     Local anesthetic was administered over this   area at the angle of the clavicle.  The vein was accessed with 2 pass(es) of the needle. There was good venous return and the wire passed easily with no ectopy.   Fluoroscopy was used to confirm that the wire was in the vena cava.      The patient was placed back level and the area for the pocket was anethetized   with local anesthetic.  A 3-cm transverse incision was made with a #15   blade.  Cautery was used to divide the subcutaneous tissues down to the   pectoralis muscle.  An [employer] retractor was used to elevate the skin   while a pocket was created on top of the  pectoralis fascia.  The port   was placed into the pocket to confirm that it was of adequate size.  The   catheter was preattached to the port.  The port was then secured to the   pectoralis fascia with four 2-0 Prolene sutures.  These were clamped and   not tied down yet.    The catheter was tunneled through to the wire exit   site.  The catheter was placed along the wire to determine what length it should be to be in the SVC.  The catheter was cut at 16 cm.  The tunneler sheath and dilator were passed over the wire and the dilator and wire were removed.  The catheter was advanced through the tunneler sheath and the tunneler sheath was pulled away.  Care was taken to keep the catheter in the tunneler sheath as this occurred. This was advanced and the tunneler sheath was removed.  There was good venous   return and easy flush of the catheter.  The Prolene sutures were tied   down to the pectoral fascia.  The skin was reapproximated using 3-0   Vicryl interrupted deep dermal sutures.    Fluoroscopy  was used to re-confirm good position of the catheter.  The skin   was then closed using 4-0 Monocryl in a subcuticular fashion.  The port was flushed with concentrated heparin flush as well.  The wounds were then cleaned, dried, and dressed with Dermabond.  The breast skin biopsy sites were dressed with bandaids.    The patient was awakened from anesthesia and taken to the PACU in stable condition.  Needle, sponge, and instrument counts were correct.               [name], MD

## 2020-03-22 NOTE — Anesthesia Procedure Notes (Signed)
Procedure Name: LMA Insertion Date/Time: 03/22/2020 7:51 AM Performed by: Colin Benton, CRNA Pre-anesthesia Checklist: Patient identified, Emergency Drugs available, Suction available and Patient being monitored Patient Re-evaluated:Patient Re-evaluated prior to induction Oxygen Delivery Method: Circle system utilized Preoxygenation: Pre-oxygenation with 100% oxygen Induction Type: IV induction Ventilation: Mask ventilation without difficulty LMA: LMA inserted LMA Size: 4.0 Number of attempts: 1 Placement Confirmation: breath sounds checked- equal and bilateral and positive ETCO2 Tube secured with: Tape Dental Injury: Teeth and Oropharynx as per pre-operative assessment

## 2020-03-22 NOTE — Discharge Instructions (Signed)
[facility] °Office Phone Number [phone] ° ° POST OP INSTRUCTIONS ° °Always review your discharge instruction sheet given to you by the facility where your surgery was performed. ° °IF YOU HAVE DISABILITY OR FAMILY LEAVE FORMS, YOU MUST BRING THEM TO THE OFFICE FOR PROCESSING.  DO NOT GIVE THEM TO YOUR DOCTOR. ° °1. A prescription for pain medication may be given to you upon discharge.  Take your pain medication as prescribed, if needed.  If narcotic pain medicine is not needed, then you may take acetaminophen (Tylenol) or ibuprofen (Advil) as needed. °2. Take your usually prescribed medications unless otherwise directed °3. If you need a refill on your pain medication, please contact your pharmacy.  They will contact our office to request authorization.  Prescriptions will not be filled after 5pm or on week-ends. °4. You should eat very light the first 24 hours after surgery, such as soup, crackers, pudding, etc.  Resume your normal diet the day after surgery °5. It is common to experience some constipation if taking pain medication after surgery.  Increasing fluid intake and taking a stool softener will usually help or prevent this problem from occurring.  A mild laxative (Milk of Magnesia or Miralax) should be taken according to package directions if there are no bowel movements after 48 hours. °6. You may shower in 48 hours.  The surgical glue will flake off in 2-3 weeks.   °7. ACTIVITIES:  No strenuous activity or heavy lifting for 1 week.   °a. You may drive when you no longer are taking prescription pain medication, you can comfortably wear a seatbelt, and you can safely maneuver your car and apply brakes. °b. RETURN TO WORK:  __________to be determined._______________ °You should see your doctor in the office for a follow-up appointment approximately three-four weeks after your surgery.   ° °WHEN TO CALL YOUR DOCTOR: °1. Fever over 101.0 °2. Nausea and/or vomiting. °3. Extreme swelling  or bruising. °4. Continued bleeding from incision. °5. Increased pain, redness, or drainage from the incision. ° °The clinic staff is available to answer your questions during regular business hours.  Please don’t hesitate to call and ask to speak to one of the nurses for clinical concerns.  If you have a medical emergency, go to the nearest emergency room or call 911.  A surgeon from [facility] is always on call at the hospital. ° °For further questions, please visit [email or web address]  ° °

## 2020-03-22 NOTE — Interval H&P Note (Signed)
History and Physical Interval Note:  03/22/2020 7:34 AM  Victoria Coffey  has presented today for surgery, with the diagnosis of BREAST CANCER.  The various methods of treatment have been discussed with the patient and family. After consideration of risks, benefits and other options for treatment, the patient has consented to  Procedure(s): INSERTION PORT-A-CATH WITH ULTRASOUND GUIDANCE (N/A) BILATERAL BREAST PUNCH BIOPSIES (Bilateral) as a surgical intervention.  The patient's history has been reviewed, patient examined, no change in status, stable for surgery.  I have reviewed the patient's chart and labs.  Questions were answered to the patient's satisfaction.     [name]

## 2020-03-22 NOTE — Patient Instructions (Signed)
[facility] Discharge Instructions for Patients Receiving Chemotherapy  Today you received the following chemotherapy agents:  Adriamycin & Cytoxan  To help prevent nausea and vomiting after your treatment, we encourage you to take your nausea medication as prescribed. If you develop nausea and vomiting that is not controlled by your nausea medication, call the clinic.   BELOW ARE SYMPTOMS THAT SHOULD BE REPORTED IMMEDIATELY:  *FEVER GREATER THAN 100.5 F  *CHILLS WITH OR WITHOUT FEVER  NAUSEA AND VOMITING THAT IS NOT CONTROLLED WITH YOUR NAUSEA MEDICATION  *UNUSUAL SHORTNESS OF BREATH  *UNUSUAL BRUISING OR BLEEDING  TENDERNESS IN MOUTH AND THROAT WITH OR WITHOUT PRESENCE OF ULCERS  *URINARY PROBLEMS  *BOWEL PROBLEMS  UNUSUAL RASH Items with * indicate a potential emergency and should be followed up as soon as possible.  Feel free to call the clinic should you have any questions or concerns. The clinic phone number is [phone].  Please show the CHEMO ALERT CARD at check-in to the Emergency Department and triage nurse.  Doxorubicin injection What is this medicine? DOXORUBICIN (dox oh ROO bi sin) is a chemotherapy drug. It is used to treat many kinds of cancer like leukemia, lymphoma, neuroblastoma, sarcoma, and Wilms' tumor. It is also used to treat bladder cancer, breast cancer, lung cancer, ovarian cancer, stomach cancer, and thyroid cancer. This medicine may be used for other purposes; ask your health care provider or pharmacist if you have questions. COMMON BRAND NAME(S): Adriamycin, Adriamycin PFS, Adriamycin RDF, Rubex What should I tell my health care provider before I take this medicine? They need to know if you have any of these conditions:  heart disease  history of low blood counts caused by a medicine  liver disease  recent or ongoing radiation therapy  an unusual or allergic reaction to doxorubicin, other chemotherapy agents, other  medicines, foods, dyes, or preservatives  pregnant or trying to get pregnant  breast-feeding How should I use this medicine? This drug is given as an infusion into a vein. It is administered in a hospital or clinic by a specially trained health care professional. If you have pain, swelling, burning or any unusual feeling around the site of your injection, tell your health care professional right away. Talk to your pediatrician regarding the use of this medicine in children. Special care may be needed. Overdosage: If you think you have taken too much of this medicine contact a poison control center or emergency room at once. NOTE: This medicine is only for you. Do not share this medicine with others. What if I miss a dose? It is important not to miss your dose. Call your doctor or health care professional if you are unable to keep an appointment. What may interact with this medicine? This medicine may interact with the following medications:  6-mercaptopurine  paclitaxel  phenytoin  St. John's Wort  trastuzumab  verapamil This list may not describe all possible interactions. Give your health care provider a list of all the medicines, herbs, non-prescription drugs, or dietary supplements you use. Also tell them if you smoke, drink alcohol, or use illegal drugs. Some items may interact with your medicine. What should I watch for while using this medicine? This drug may make you feel generally unwell. This is not uncommon, as chemotherapy can affect healthy cells as well as cancer cells. Report any side effects. Continue your course of treatment even though you feel ill unless your doctor tells you to stop. There is a maximum amount of   this medicine you should receive throughout your life. The amount depends on the medical condition being treated and your overall health. Your doctor will watch how much of this medicine you receive in your lifetime. Tell your doctor if you have taken this  medicine before. You may need blood work done while you are taking this medicine. Your urine may turn red for a few days after your dose. This is not blood. If your urine is dark or brown, call your doctor. In some cases, you may be given additional medicines to help with side effects. Follow all directions for their use. Call your doctor or health care professional for advice if you get a fever, chills or sore throat, or other symptoms of a cold or flu. Do not treat yourself. This drug decreases your body's ability to fight infections. Try to avoid being around people who are sick. This medicine may increase your risk to bruise or bleed. Call your doctor or health care professional if you notice any unusual bleeding. Talk to your doctor about your risk of cancer. You may be more at risk for certain types of cancers if you take this medicine. Do not become pregnant while taking this medicine or for 6 months after stopping it. Women should inform their doctor if they wish to become pregnant or think they might be pregnant. Men should not father a child while taking this medicine and for 6 months after stopping it. There is a potential for serious side effects to an unborn child. Talk to your health care professional or pharmacist for more information. Do not breast-feed an infant while taking this medicine. This medicine has caused ovarian failure in some women and reduced sperm counts in some men This medicine may interfere with the ability to have a child. Talk with your doctor or health care professional if you are concerned about your fertility. This medicine may cause a decrease in Co-Enzyme Q-10. You should make sure that you get enough Co-Enzyme Q-10 while you are taking this medicine. Discuss the foods you eat and the vitamins you take with your health care professional. What side effects may I notice from receiving this medicine? Side effects that you should report to your doctor or health care  professional as soon as possible:  allergic reactions like skin rash, itching or hives, swelling of the face, lips, or tongue  breathing problems  chest pain  fast or irregular heartbeat  low blood counts - this medicine may decrease the number of white blood cells, red blood cells and platelets. You may be at increased risk for infections and bleeding.  pain, redness, or irritation at site where injected  signs of infection - fever or chills, cough, sore throat, pain or difficulty passing urine  signs of decreased platelets or bleeding - bruising, pinpoint red spots on the skin, black, tarry stools, blood in the urine  swelling of the ankles, feet, hands  tiredness  weakness Side effects that usually do not require medical attention (report to your doctor or health care professional if they continue or are bothersome):  diarrhea  hair loss  mouth sores  nail discoloration or damage  nausea  red colored urine  vomiting This list may not describe all possible side effects. Call your doctor for medical advice about side effects. You may report side effects to FDA at 1-800-FDA-1088. Where should I keep my medicine? This drug is given in a hospital or clinic and will not be stored at home. NOTE:  This sheet is a summary. It may not cover all possible information. If you have questions about this medicine, talk to your doctor, pharmacist, or health care provider.  2020 Elsevier/Gold Standard (2017-06-02 11:01:26)  Cyclophosphamide Injection What is this medicine? CYCLOPHOSPHAMIDE (sye kloe FOSS fa mide) is a chemotherapy drug. It slows the growth of cancer cells. This medicine is used to treat many types of cancer like lymphoma, myeloma, leukemia, breast cancer, and ovarian cancer, to name a few. This medicine may be used for other purposes; ask your health care provider or pharmacist if you have questions. COMMON BRAND NAME(S): Cytoxan, Neosar What should I tell my health  care provider before I take this medicine? They need to know if you have any of these conditions:  heart disease  history of irregular heartbeat  infection  kidney disease  liver disease  low blood counts, like white cells, platelets, or red blood cells  on hemodialysis  recent or ongoing radiation therapy  scarring or thickening of the lungs  trouble passing urine  an unusual or allergic reaction to cyclophosphamide, other medicines, foods, dyes, or preservatives  pregnant or trying to get pregnant  breast-feeding How should I use this medicine? This drug is usually given as an injection into a vein or muscle or by infusion into a vein. It is administered in a hospital or clinic by a specially trained health care professional. Talk to your pediatrician regarding the use of this medicine in children. Special care may be needed. Overdosage: If you think you have taken too much of this medicine contact a poison control center or emergency room at once. NOTE: This medicine is only for you. Do not share this medicine with others. What if I miss a dose? It is important not to miss your dose. Call your doctor or health care professional if you are unable to keep an appointment. What may interact with this medicine?  amphotericin B  azathioprine  certain antivirals for HIV or hepatitis  certain medicines for blood pressure, heart disease, irregular heart beat  certain medicines that treat or prevent blood clots like warfarin  certain other medicines for cancer  cyclosporine  etanercept  indomethacin  medicines that relax muscles for surgery  medicines to increase blood counts  metronidazole This list may not describe all possible interactions. Give your health care provider a list of all the medicines, herbs, non-prescription drugs, or dietary supplements you use. Also tell them if you smoke, drink alcohol, or use illegal drugs. Some items may interact with your  medicine. What should I watch for while using this medicine? Your condition will be monitored carefully while you are receiving this medicine. You may need blood work done while you are taking this medicine. Drink water or other fluids as directed. Urinate often, even at night. Some products may contain alcohol. Ask your health care professional if this medicine contains alcohol. Be sure to tell all health care professionals you are taking this medicine. Certain medicines, like metronidazole and disulfiram, can cause an unpleasant reaction when taken with alcohol. The reaction includes flushing, headache, nausea, vomiting, sweating, and increased thirst. The reaction can last from 30 minutes to several hours. Do not become pregnant while taking this medicine or for 1 year after stopping it. Women should inform their health care professional if they wish to become pregnant or think they might be pregnant. Men should not father a child while taking this medicine and for 4 months after stopping it. There is potential   for serious side effects to an unborn child. Talk to your health care professional for more information. Do not breast-feed an infant while taking this medicine or for 1 week after stopping it. This medicine has caused ovarian failure in some women. This medicine may make it more difficult to get pregnant. Talk to your health care professional if you are concerned about your fertility. This medicine has caused decreased sperm counts in some men. This may make it more difficult to father a child. Talk to your health care professional if you are concerned about your fertility. Call your health care professional for advice if you get a fever, chills, or sore throat, or other symptoms of a cold or flu. Do not treat yourself. This medicine decreases your body's ability to fight infections. Try to avoid being around people who are sick. Avoid taking medicines that contain aspirin, acetaminophen,  ibuprofen, naproxen, or ketoprofen unless instructed by your health care professional. These medicines may hide a fever. Talk to your health care professional about your risk of cancer. You may be more at risk for certain types of cancer if you take this medicine. If you are going to need surgery or other procedure, tell your health care professional that you are using this medicine. Be careful brushing or flossing your teeth or using a toothpick because you may get an infection or bleed more easily. If you have any dental work done, tell your dentist you are receiving this medicine. What side effects may I notice from receiving this medicine? Side effects that you should report to your doctor or health care professional as soon as possible:  allergic reactions like skin rash, itching or hives, swelling of the face, lips, or tongue  breathing problems  nausea, vomiting  signs and symptoms of bleeding such as bloody or black, tarry stools; red or dark brown urine; spitting up blood or brown material that looks like coffee grounds; red spots on the skin; unusual bruising or bleeding from the eyes, gums, or nose  signs and symptoms of heart failure like fast, irregular heartbeat, sudden weight gain; swelling of the ankles, feet, hands  signs and symptoms of infection like fever; chills; cough; sore throat; pain or trouble passing urine  signs and symptoms of kidney injury like trouble passing urine or change in the amount of urine  signs and symptoms of liver injury like dark yellow or brown urine; general ill feeling or flu-like symptoms; light-colored stools; loss of appetite; nausea; right upper belly pain; unusually weak or tired; yellowing of the eyes or skin Side effects that usually do not require medical attention (report to your doctor or health care professional if they continue or are bothersome):  confusion  decreased hearing  diarrhea  facial flushing  hair  loss  headache  loss of appetite  missed menstrual periods  signs and symptoms of low red blood cells or anemia such as unusually weak or tired; feeling faint or lightheaded; falls  skin discoloration This list may not describe all possible side effects. Call your doctor for medical advice about side effects. You may report side effects to FDA at 1-800-FDA-1088. Where should I keep my medicine? This drug is given in a hospital or clinic and will not be stored at home. NOTE: This sheet is a summary. It may not cover all possible information. If you have questions about this medicine, talk to your doctor, pharmacist, or health care provider.  2020 Elsevier/Gold Standard (2019-07-24 09:53:29)

## 2020-03-22 NOTE — Transfer of Care (Signed)
Immediate Anesthesia Transfer of Care Note  Patient: Victoria Coffey  Procedure(s) Performed: INSERTION PORT-A-CATH WITH ULTRASOUND GUIDANCE (Right Chest) BILATERAL BREAST PUNCH BIOPSIES (Bilateral Breast)  Patient Location: PACU  Anesthesia Type:General  Level of Consciousness: awake, alert , oriented and patient cooperative  Airway & Oxygen Therapy: Patient Spontanous Breathing and Patient connected to nasal cannula oxygen  Post-op Assessment: Report given to RN and Post -op Vital signs reviewed and stable  Post vital signs: Reviewed and stable  Last Vitals:  Vitals Value Taken Time  BP 127/73 03/22/20 0859  Temp    Pulse 72 03/22/20 0900  Resp 14 03/22/20 0900  SpO2 100 % 03/22/20 0900  Vitals shown include unvalidated device data.  Last Pain:  Vitals:   03/22/20 0609  PainSc: 0-No pain         Complications: No apparent anesthesia complications

## 2020-03-22 NOTE — Anesthesia Postprocedure Evaluation (Signed)
Anesthesia Post Note  Patient: SAKSHI GOODBAR  Procedure(s) Performed: INSERTION PORT-A-CATH WITH ULTRASOUND GUIDANCE (Right Chest) BILATERAL BREAST PUNCH BIOPSIES (Bilateral Breast)     Patient location during evaluation: PACU Anesthesia Type: General Level of consciousness: awake and alert and oriented Pain management: pain level controlled Vital Signs Assessment: post-procedure vital signs reviewed and stable Respiratory status: spontaneous breathing, nonlabored ventilation and respiratory function stable Cardiovascular status: blood pressure returned to baseline and stable Postop Assessment: no apparent nausea or vomiting Anesthetic complications: no    Last Vitals:  Vitals:   03/22/20 0900 03/22/20 0915  BP: 127/73 129/77  Pulse: 72 72  Resp: (!) 22 20  Temp: 36.4 C 36.7 C  SpO2: 100% 100%    Last Pain:  Vitals:   03/22/20 0900  PainSc: 0-No pain                 Mattye Verdone A.

## 2020-03-23 ENCOUNTER — Ambulatory Visit: Payer: BC Managed Care – PPO | Attending: Internal Medicine

## 2020-03-23 DIAGNOSIS — Z23 Encounter for immunization: Secondary | ICD-10-CM

## 2020-03-23 LAB — CANCER ANTIGEN 27.29: CA 27.29: 434.6 U/mL — ABNORMAL HIGH (ref 0.0–38.6)

## 2020-03-23 NOTE — Progress Notes (Signed)
   Covid-19 Vaccination Clinic  Name:  Victoria Coffey    MRN: [id number] DOB: 03-04-1956  03/23/2020  [name] was observed post Covid-19 immunization for 15 minutes without incident. She was provided with Vaccine Information Sheet and instruction to access the V-Safe system.   [name] was instructed to call 911 with any severe reactions post vaccine: Marland Kitchen Difficulty breathing  . Swelling of face and throat  . A fast heartbeat  . A bad rash all over body  . Dizziness and weakness   Immunizations Administered    Name Date Dose VIS Date Route   Pfizer COVID-19 Vaccine 03/23/2020  8:12 AM 0.3 mL 12/27/2018 Intramuscular   Manufacturer: [employer], [employer]   Lot: [id number]   [place]: [id number]

## 2020-03-25 ENCOUNTER — Other Ambulatory Visit (HOSPITAL_COMMUNITY): Payer: BC Managed Care – PPO

## 2020-03-25 ENCOUNTER — Inpatient Hospital Stay: Payer: BC Managed Care – PPO

## 2020-03-25 ENCOUNTER — Other Ambulatory Visit: Payer: Self-pay

## 2020-03-25 VITALS — BP 122/78 | HR 77 | Temp 98.9°F | Resp 18

## 2020-03-25 DIAGNOSIS — Z170 Estrogen receptor positive status [ER+]: Secondary | ICD-10-CM

## 2020-03-25 DIAGNOSIS — C50412 Malignant neoplasm of upper-outer quadrant of left female breast: Secondary | ICD-10-CM | POA: Diagnosis not present

## 2020-03-25 MED ORDER — PEGFILGRASTIM-JMDB 6 MG/0.6ML ~~LOC~~ SOSY
PREFILLED_SYRINGE | SUBCUTANEOUS | Status: AC
  Filled 2020-03-25: qty 0.6

## 2020-03-25 MED ORDER — PEGFILGRASTIM-JMDB 6 MG/0.6ML ~~LOC~~ SOSY
6.0000 mg | PREFILLED_SYRINGE | Freq: Once | SUBCUTANEOUS | Status: AC
  Administered 2020-03-25: 6 mg via SUBCUTANEOUS

## 2020-03-25 NOTE — Patient Instructions (Signed)

## 2020-03-26 ENCOUNTER — Telehealth: Payer: Self-pay | Admitting: Hematology

## 2020-03-26 ENCOUNTER — Encounter: Payer: Self-pay | Admitting: *Deleted

## 2020-03-26 NOTE — Telephone Encounter (Signed)
I called pt and discussed her breast MRI findings.  In addition to the left known breast cancer, she also has a large non-mass-like enhancement in the central upper right breast, highly suspicious for malignancy.  Dr. [name] did punch skin biopsy of both right and left breast last Friday, which all came back positive for carcinoma.  We discussed the option of right breast biopsy, versus right mastectomy.  Patient would like to proceed with bilateral mastectomy.  In that case both Dr. [name] and I feel no need for right breast mass biopsy since we already have positive skin and right axillary node biopsy which both showed cancer cells.   Patient tolerated first cycle chemotherapy last week very well, with mild nausea.  She is doing well overall.  I will see her back next week for second cycle.  [name]  03/26/2020

## 2020-03-28 ENCOUNTER — Ambulatory Visit: Admit: 2020-03-28 | Payer: BC Managed Care – PPO | Admitting: General Surgery

## 2020-03-28 ENCOUNTER — Telehealth: Payer: Self-pay | Admitting: *Deleted

## 2020-03-28 LAB — SURGICAL PATHOLOGY

## 2020-03-28 SURGERY — BREAST BIOPSY
Anesthesia: General | Site: Breast | Laterality: Bilateral

## 2020-03-28 NOTE — Telephone Encounter (Signed)
Called pt regarding MR abd/pelvis and reasoning as well as discussed and gave instructions on using EMLA cream for 6/4 infusion. Pt decided to further discuss MR abd with Dr. Burr Medico on 6/4 appt. Denies further needs or questions at this time. Relate doing well after chemo with some fatigue. No other complaints at this time.

## 2020-03-29 NOTE — Progress Notes (Signed)
Pharmacist Chemotherapy Monitoring - Follow Up Assessment    I verify that I have reviewed each item in the below checklist:  . Regimen for the patient is scheduled for the appropriate day and plan matches scheduled date. Marland Kitchen Appropriate non-routine labs are ordered dependent on drug ordered. . If applicable, additional medications reviewed and ordered per protocol based on lifetime cumulative doses and/or treatment regimen.   Plan for follow-up and/or issues identified: No . I-vent associated with next due treatment: No . MD and/or nursing notified: No  Catelynn Sparger D 03/29/2020 11:28 AM

## 2020-04-03 NOTE — Progress Notes (Signed)
[place]   [phone] [phone]   Clinic Follow up Note   Patient Care Team: [name], MD as PCP - General (Infectious Diseases) [name], MD as PCP - Infectious Diseases (Infectious Diseases) [name], MD as Consulting Physician (General Surgery) [name], MD as Consulting Physician (Hematology) [name], MD as Consulting Physician (Radiation Oncology) [name], RN as Oncology Nurse Navigator [name], RN as Oncology Nurse Navigator  Date of Service:  04/05/2020  CHIEF COMPLAINT: F/u of left breast cancer  SUMMARY OF ONCOLOGIC HISTORY: Oncology History  Malignant neoplasm of upper-outer quadrant of left breast in female, estrogen receptor positive ([place])  02/28/2020 Mammogram   Diagnostic Mammogram 02/28/20  IMPRESSION The 3.9 cm ill defined mass in the left breast posterior depth central 4.3cm to the nipple seen on the mediolateral oblique view onlt with associated difssue skin thickening is highly suspicious for malignancy.    03/01/2020 Cancer Staging   Staging form: Breast, AJCC 8th Edition - Clinical stage from 03/01/2020: Stage IV (cT4, cN0, pM1, G3, ER+, PR-, HER2-) - Signed by [name], MD on 03/06/2020   03/01/2020 Initial Biopsy   Diagnosis 03/01/20  1. Breast, left, needle core biopsy, 9 o'clock, 3-4cmfn - INVASIVE DUCTAL CARCINOMA. SEE NOTE 2. Lymph node, needle/core biopsy, right axilla - INVASIVE DUCTAL CARCINOMA. SEE NOTE Diagnosis Note 1. Invasive carcinoma measures 1.5 cm in greatest linear dimension and appears grade 3. Dr. [name] reviewed the case and concurs with the diagnosis. A breast prognostic profile (ER, PR, Ki-67 and HER2) is pending and will be reported in an addendum. Dr. [name] was notified on 03/04/2020. 2. Carcinoma measures 0.6 cm in greatest linear dimension and appears grade 3. Lymphoid tissue is not identified - this may represent a completely replaced lymph node. A  breast prognostic profile (ER, PR, Ki-67 and HER2) is pending and will be reported in an addendum. Dr. [name] reviewed the case and concurs with the diagnosis. Dr. [name] was notified on 03/04/2020.   03/01/2020 Receptors her2   1. PROGNOSTIC INDICATORS Results: IMMUNOHISTOCHEMICAL AND MORPHOMETRIC ANALYSIS PERFORMED MANUALLY The tumor cells are NEGATIVE for Her2 (0). Estrogen Receptor: 20%, POSITIVE, WEAK STAINING INTENSITY Progesterone Receptor: 0%, NEGATIVE Proliferation Marker Ki67: 40% COMMENT: The negative hormone receptor study(ies) in this case has an internal positive control.    2. PROGNOSTIC INDICATORS Results: IMMUNOHISTOCHEMICAL AND MORPHOMETRIC ANALYSIS PERFORMED MANUALLY The tumor cells are NEGATIVE for Her2 (1+). Estrogen Receptor: 10%, POSITIVE, WEAK STAINING INTENSITY Progesterone Receptor: 0%, NEGATIVE Proliferation Marker Ki67: 40% COMMENT: The negative hormone receptor study(ies) in this case has no internal positive control.   03/05/2020 Initial Diagnosis   Malignant neoplasm of upper-outer quadrant of left breast in female, estrogen receptor positive ([place])   03/18/2020 Breast MRI   IMPRESSION: 1. 7 x 9 x 6 cm area of suspicious non masslike enhancement within the central/UPPER RIGHT breast with anterior skin thickening. Given biopsy-proven metastatic RIGHT axillary lymph node, tissue sampling of the anterior and posterior aspects of this non masslike RIGHT breast enhancement is recommended to exclude malignancy. 2. 7 x 6 x 5 cm diffuse masslike and nonmasslike enhancement within the majority of the remaining LEFT breast with diffuse LEFT breast skin thickening, compatible with malignancy. Abnormal enhancement is noted along the anterior aspect of the LEFT pectoralis muscle. 3. Three abnormal RIGHT axillary lymph nodes, 1 which is biopsy proven to be metastatic disease. No abnormal LEFT axillary lymph nodes identified.   03/20/2020 Echocardiogram    Baseline  ECHO  IMPRESSIONS     1. The patient was reported to be in pain during the study and not able  to participate in this study. All that can be said is that the LV and RV  function are grossly normal. Would recommend to repeat this study when/if  the patient is able to tolerate  the exam.   2. Left ventricular ejection fraction, by estimation, is 60 to 65%. The  left ventricle has normal function. Left ventricular endocardial border  not optimally defined to evaluate regional wall motion. Left ventricular  diastolic function could not be  evaluated.   3. Right ventricular systolic function is normal. The right ventricular  size is normal.   4. The mitral valve was not well visualized. No evidence of mitral valve  regurgitation.   5. The aortic valve was not well visualized. Aortic valve regurgitation  is not visualized.   6. The inferior vena cava is normal in size with greater than 50%  respiratory variability, suggesting right atrial pressure of 3 mmHg.    03/20/2020 Imaging   CT CAP W contrast  IMPRESSION: 1. Small right axillary/subpectoral lymph nodes. Difficult to exclude metastatic disease. 2. Subtle 6 mm low-attenuation lesion in the dome of the liver, not well seen previously. Lesion is too small to characterize. Further evaluation could be performed with MR abdomen without and with contrast, as clinically indicated. 3. Hepatic steatosis. 4. Aortic atherosclerosis (ICD10-I70.0). Coronary artery calcification.     03/20/2020 Imaging   Whole body bone scan  IMPRESSION: 1. Increased activity noted over the right breast, possibly related to the patient's right breast cancer.   2. Punctate area of increased activity over the left maxillary region, most likely related to sinus disease or dental disease. Increased activity noted over the left mandible also possibly related to dental disease. No other focal bony abnormalities to suggest metastatic disease  identified.   03/22/2020 Procedure   PAC placement by Dr [name]    03/22/2020 -  Chemotherapy   AC q2weeks for 4 cycles starting 03/22/20 followed by weekly Taxol for 12 weeks.   03/22/2020 Pathology Results   FINAL MICROSCOPIC DIAGNOSIS:   A. BREAST, RIGHT, PUNCH BIOPSY:  - Carcinoma involving dermal lymphatics.  - See comment.   B. BREAST, LEFT, PUNCH BIOPSY:  - Carcinoma involving dermal lymphatics.  - See comment.   COMMENT:  The morphology is compatible with ductal breast carcinoma.    ADDENDUM:   PROGNOSTIC INDICATOR RESULTS:   Immunohistochemical and morphometric analysis performed manually   The tumor cells are NEGATIVE for Her2 (0%).   Estrogen Receptor:       NEGATIVE, 0%  Progesterone Receptor:   NEGATIVE, 0%       CURRENT THERAPY:  AC q2weeks for 4 cycles starting 03/22/20 followed by weekly Taxol for 12 weeks.  INTERVAL HISTORY:  RONNIKA COLLETT is here for a follow up and treatment. She presents to the clinic alone. She notes she continued to tolerate first cycle chemo well. She notes her diarrhea was from CT contrast. She has been doing banana and yogurt which helped. She notes her left breast is healing since punch biopsy.  She reiterated that she would like to have a b/l mastectomy when available to her.    REVIEW OF SYSTEMS:   Constitutional: Denies fevers, chills or abnormal weight loss Eyes: Denies blurriness of vision Ears, nose, mouth, throat, and face: Denies mucositis or sore throat Respiratory: Denies cough, dyspnea or wheezes Cardiovascular: Denies palpitation,  chest discomfort or lower extremity swelling Gastrointestinal:  Denies nausea, heartburn or change in bowel habits Skin: Denies abnormal skin rashes Lymphatics: Denies new lymphadenopathy or easy bruising Neurological:Denies numbness, tingling or new weaknesses Behavioral/Psych: Mood is stable, no new changes  All other systems were reviewed with the patient and are  negative.  MEDICAL HISTORY:  Past Medical History:  Diagnosis Date  . Breast cancer ([place]) dx'd 1993   left  . History of left breast cancer   . HIV infection ([place])   . Hypertension     SURGICAL HISTORY: Past Surgical History:  Procedure Laterality Date  . BREAST BIOPSY Bilateral 03/22/2020   Procedure: BILATERAL BREAST PUNCH BIOPSIES;  Surgeon: [name], MD;  Location: [facility];  Service: General;  Laterality: Bilateral;  . BREAST LUMPECTOMY    . CESAREAN SECTION    . left breast lumpectomy  1993  . PORTACATH PLACEMENT Right 03/22/2020   Procedure: INSERTION PORT-A-CATH WITH ULTRASOUND GUIDANCE;  Surgeon: [name], MD;  Location: [facility];  Service: General;  Laterality: Right;    I have reviewed the social history and family history with the patient and they are unchanged from previous note.  ALLERGIES:  is allergic to codeine; other; grapefruit bioflavonoid complex; pomegranate [punica]; and shellfish allergy.  MEDICATIONS:  Current Outpatient Medications  Medication Sig Dispense Refill  . acetaminophen (TYLENOL) 500 MG tablet Take 1,000 mg by mouth every 6 (six) hours as needed for moderate pain.    Marland Kitchen emtricitabine-rilpivir-tenofovir AF (ODEFSEY) 200-25-25 MG TABS tablet Take 1 tablet by mouth daily with breakfast. 30 tablet 11  . hydrochlorothiazide (HYDRODIURIL) 25 MG tablet Take 1 tablet (25 mg total) by mouth daily. 30 tablet 11  . lidocaine-prilocaine (EMLA) cream Apply to affected area once 30 g 3  . ondansetron (ZOFRAN) 8 MG tablet Take 1 tablet (8 mg total) by mouth 2 (two) times daily as needed. Start on the third day after chemotherapy. 30 tablet 1  . oxyCODONE (OXY IR/ROXICODONE) 5 MG immediate release tablet Take 1 tablet (5 mg total) by mouth every 6 (six) hours as needed for severe pain. 15 tablet 0  . prochlorperazine (COMPAZINE) 10 MG tablet Take 1 tablet (10 mg total) by mouth every 6 (six) hours as needed (Nausea or vomiting). 30 tablet 1   No current  facility-administered medications for this visit.    PHYSICAL EXAMINATION: ECOG PERFORMANCE STATUS: 1 - Symptomatic but completely ambulatory  Vitals:   04/05/20 0840  BP: (!) 142/93  Pulse: 77  Resp: 18  Temp: (!) 97.5 F (36.4 C)  SpO2: 100%   Filed Weights   04/05/20 0840  Weight: 161 lb 3.2 oz (73.1 kg)    GENERAL:alert, no distress and comfortable SKIN: skin color, texture, turgor are normal, no rashes or significant lesions EYES: normal, Conjunctiva are pink and non-injected, sclera clear  NECK: supple, thyroid normal size, non-tender, without nodularity LYMPH:  no palpable lymphadenopathy in the cervical, axillary  LUNGS: clear to auscultation and percussion with normal breathing effort HEART: regular rate & rhythm and no murmurs and no lower extremity edema ABDOMEN:abdomen soft, non-tender and normal bowel sounds Musculoskeletal:no cyanosis of digits and no clubbing  NEURO: alert & oriented x 3 with fluent speech, no focal motor/sensory deficits BREAST: (+) Skin thickening of her left breast with hyperpigmentation (+) Right breast 9x7cm palpable mass in RUQ with skin erythema. (+) 2cm palpable right axillary LN (+) Right breast larger than left  LABORATORY DATA:  I have reviewed the data as  listed CBC Latest Ref Rng & Units 04/05/2020 03/22/2020 03/19/2020  WBC 4.0 - 10.5 K/uL 7.1 4.2 4.5  Hemoglobin 12.0 - 15.0 g/dL 10.6(L) 12.7 13.9  Hematocrit 36.0 - 46.0 % 32.4(L) 38.9 43.5  Platelets 150 - 400 K/uL 279 324 367     CMP Latest Ref Rng & Units 04/05/2020 03/22/2020 03/19/2020  Glucose 70 - 99 mg/dL 101(H) 129(H) 107(H)  BUN 8 - 23 mg/dL _0 Creatinine 0.44 - 1.00 mg/dL 0.79 0.89 0.91  Sodium 135 - 145 mmol/L 140 140 140  Potassium 3.5 - 5.1 mmol/L 3.9 3.5 3.7  Chloride 98 - 111 mmol/L 107 104 102  CO2 22 - 32 mmol/L 21(L) 27 25  Calcium 8.9 - 10.3 mg/dL 8.9 9.3 9.5  Total Protein 6.5 - 8.1 g/dL 6.8 7.7 -  Total Bilirubin 0.3 - 1.2 mg/dL <0.2(L) <0.2(L) -    Alkaline Phos 38 - 126 U/L 239(H) 172(H) -  AST 15 - 41 U/L 63(H) 48(H) -  ALT 0 - 44 U/L 98(H) 52(H) -      RADIOGRAPHIC STUDIES: I have personally reviewed the radiological images as listed and agreed with the findings in the report. No results found.   ASSESSMENT & PLAN:  Victoria Coffey is a 64 y.o. female with    1.Left breast cancer,cT4N0M1, possible stageIVwithinflammatory to skin of B/l breast (triple negative) and right breat cancer in [facility] with axillary node metastasis, ERweakly+,PR/HER2-, Azucena Kuba -She was recently diagnosed with left breast cancer in 02/2020. Her Mammogram showed an at least 4cm mass of left breast. Based on initial exam sheprobably hasskin involvementand her entire left breast is occupied by cancer. Her mammogram also showed right LN enlargement. 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 It is not definitive if her right LN are related to her left breast cancer, or if she hasasecond right breast cancer -Her breast MRI from 03/18/20 showed left known breast cancer, she also has a large non-mass-like enhancement in the central upper right breast, highly suspicious for malignancy. Dr. [name] did punch skin biopsy of both right and left breast on 03/22/20, which all came back positive for carcinoma with ER/PR/HER2 negative markers. We discussed the option of right breast biopsy, versus right mastectomy. Patient would like to proceed with bilateral mastectomy. No need for right breast mass biopsy in this case.  -Her 03/20/20 Bone scan was negative for mets. Her CT scan shows 1m low density liver lesion, which appears benign to me but indeterminate. I recommended MRI  -I started her on neoadjuvant chemotherapy as first-line treatment with Adriamycin and Cytoxan q2weeks for 4 cycles starting 03/22/20, followed by weekly carboplatin and Taxol for 12 weeksto downstage her cancer andhopefully she will become a  candidate for surgical resection.  -She had her PAC placed on 03/22/20. Baseline ECHO normal.  -S/p C1 her physical exam shows right breast mass 9x7cm and right axillary LN 2cm and with her right breast larger than left due to her mass. Will monitor with each cycle of chemo to monitor clinical response.  -I suggested she use prosthetic bra due to asymmetrical breasts.  -Labs reviewed, Hg 10.6, mild increase in LFTs. Overall adequate to proceed with C2 AC today  -F/u in 2 weeks    2. H/o Left breast cancer, Dx in 1993 -[facility] D[facility] Dr BWeyman Rodney She was treated with left lumpectomy with SNLB and cancer was in  the LN. She denies lymphedema. She had adjuvant chemo (probably CMF), Adjuvant Radiation and Tamoxifen for 5 years.    3. Genetic Testing  -She has not family history of cancer but this is her second breast cancer so she is eligible for genetic testing. She is agreeable. I will send referral.  -Her husband has a high rate of breast cancer in his family. I recommend their daughter starts breast Mammogram, and if needed MRIs, given the early age of the patient's first breast cancer.    4. Comorbidities: HIV(+), HTN -She was diagnosed in 1993. Her HIV has been undetectable and well controlled. She is currently being seen by ID Dr Graylon Good  -She is on HCTZ for her HTN  5.  Mild transaminitis -Unclear etiology, presents before chemotherapy -Her LFTs have mildly progressed further. Will schedule her MRI abdomen to be done in 1-2 weeks.    PLAN: -MRI abdomen in 1-2 weeks  -Labs reviewed and adequate to proceed with C2 AC today  -Lab, flush, F/u and AC in 2 and 4 weeks    No problem-specific Assessment & Plan notes found for this encounter.   No orders of the defined types were placed in this encounter.  All questions were answered. The patient knows to call the clinic with any problems, questions or concerns. No barriers to  learning was detected. The total time spent in the appointment was 30 minutes.     [name], MD 04/05/2020   I, Joslyn Devon, am acting as scribe for [name], MD.   I have reviewed the above documentation for accuracy and completeness, and I agree with the above.

## 2020-04-05 ENCOUNTER — Inpatient Hospital Stay (HOSPITAL_BASED_OUTPATIENT_CLINIC_OR_DEPARTMENT_OTHER): Payer: BC Managed Care – PPO | Admitting: Hematology

## 2020-04-05 ENCOUNTER — Encounter: Payer: Self-pay | Admitting: Hematology

## 2020-04-05 ENCOUNTER — Other Ambulatory Visit: Payer: Self-pay

## 2020-04-05 ENCOUNTER — Telehealth: Payer: Self-pay | Admitting: Hematology

## 2020-04-05 ENCOUNTER — Inpatient Hospital Stay: Payer: BC Managed Care – PPO

## 2020-04-05 ENCOUNTER — Encounter: Payer: Self-pay | Admitting: *Deleted

## 2020-04-05 ENCOUNTER — Inpatient Hospital Stay: Payer: BC Managed Care – PPO | Attending: Hematology

## 2020-04-05 VITALS — BP 142/93 | HR 77 | Temp 97.5°F | Resp 18 | Ht 61.0 in | Wt 161.2 lb

## 2020-04-05 DIAGNOSIS — Z853 Personal history of malignant neoplasm of breast: Secondary | ICD-10-CM | POA: Insufficient documentation

## 2020-04-05 DIAGNOSIS — Z8249 Family history of ischemic heart disease and other diseases of the circulatory system: Secondary | ICD-10-CM | POA: Insufficient documentation

## 2020-04-05 DIAGNOSIS — Z170 Estrogen receptor positive status [ER+]: Secondary | ICD-10-CM | POA: Diagnosis not present

## 2020-04-05 DIAGNOSIS — Z21 Asymptomatic human immunodeficiency virus [HIV] infection status: Secondary | ICD-10-CM | POA: Diagnosis not present

## 2020-04-05 DIAGNOSIS — C50412 Malignant neoplasm of upper-outer quadrant of left female breast: Secondary | ICD-10-CM | POA: Insufficient documentation

## 2020-04-05 DIAGNOSIS — I10 Essential (primary) hypertension: Secondary | ICD-10-CM

## 2020-04-05 DIAGNOSIS — B20 Human immunodeficiency virus [HIV] disease: Secondary | ICD-10-CM

## 2020-04-05 DIAGNOSIS — R7401 Elevation of levels of liver transaminase levels: Secondary | ICD-10-CM | POA: Diagnosis not present

## 2020-04-05 DIAGNOSIS — Z5111 Encounter for antineoplastic chemotherapy: Secondary | ICD-10-CM | POA: Insufficient documentation

## 2020-04-05 DIAGNOSIS — Z79899 Other long term (current) drug therapy: Secondary | ICD-10-CM | POA: Insufficient documentation

## 2020-04-05 DIAGNOSIS — Z833 Family history of diabetes mellitus: Secondary | ICD-10-CM | POA: Diagnosis not present

## 2020-04-05 LAB — CBC WITH DIFFERENTIAL (CANCER CENTER ONLY)
Abs Immature Granulocytes: 0.21 10*3/uL — ABNORMAL HIGH (ref 0.00–0.07)
Basophils Absolute: 0 10*3/uL (ref 0.0–0.1)
Basophils Relative: 0 %
Eosinophils Absolute: 0 10*3/uL (ref 0.0–0.5)
Eosinophils Relative: 0 %
HCT: 32.4 % — ABNORMAL LOW (ref 36.0–46.0)
Hemoglobin: 10.6 g/dL — ABNORMAL LOW (ref 12.0–15.0)
Immature Granulocytes: 3 %
Lymphocytes Relative: 20 %
Lymphs Abs: 1.4 10*3/uL (ref 0.7–4.0)
MCH: 27.9 pg (ref 26.0–34.0)
MCHC: 32.7 g/dL (ref 30.0–36.0)
MCV: 85.3 fL (ref 80.0–100.0)
Monocytes Absolute: 0.8 10*3/uL (ref 0.1–1.0)
Monocytes Relative: 11 %
Neutro Abs: 4.7 10*3/uL (ref 1.7–7.7)
Neutrophils Relative %: 66 %
Platelet Count: 279 10*3/uL (ref 150–400)
RBC: 3.8 MIL/uL — ABNORMAL LOW (ref 3.87–5.11)
RDW: 15.9 % — ABNORMAL HIGH (ref 11.5–15.5)
WBC Count: 7.1 10*3/uL (ref 4.0–10.5)
nRBC: 0.3 % — ABNORMAL HIGH (ref 0.0–0.2)

## 2020-04-05 LAB — CMP (CANCER CENTER ONLY)
ALT: 98 U/L — ABNORMAL HIGH (ref 0–44)
AST: 63 U/L — ABNORMAL HIGH (ref 15–41)
Albumin: 3.3 g/dL — ABNORMAL LOW (ref 3.5–5.0)
Alkaline Phosphatase: 239 U/L — ABNORMAL HIGH (ref 38–126)
Anion gap: 12 (ref 5–15)
BUN: 10 mg/dL (ref 8–23)
CO2: 21 mmol/L — ABNORMAL LOW (ref 22–32)
Calcium: 8.9 mg/dL (ref 8.9–10.3)
Chloride: 107 mmol/L (ref 98–111)
Creatinine: 0.79 mg/dL (ref 0.44–1.00)
GFR, Est AFR Am: >60 mL/min (ref >60)
GFR, Estimated: >60 mL/min (ref >60)
Glucose, Bld: 101 mg/dL — ABNORMAL HIGH (ref 70–99)
Potassium: 3.9 mmol/L (ref 3.5–5.1)
Sodium: 140 mmol/L (ref 135–145)
Total Bilirubin: <0.2 mg/dL — ABNORMAL LOW (ref 0.3–1.2)
Total Protein: 6.8 g/dL (ref 6.5–8.1)

## 2020-04-05 MED ORDER — SODIUM CHLORIDE 0.9 % IV SOLN
150.0000 mg | Freq: Once | INTRAVENOUS | Status: AC
  Administered 2020-04-05: 150 mg via INTRAVENOUS
  Filled 2020-04-05: qty 150

## 2020-04-05 MED ORDER — SODIUM CHLORIDE 0.9 % IV SOLN
10.0000 mg | Freq: Once | INTRAVENOUS | Status: AC
  Administered 2020-04-05: 10 mg via INTRAVENOUS
  Filled 2020-04-05: qty 10

## 2020-04-05 MED ORDER — SODIUM CHLORIDE 0.9 % IV SOLN
Freq: Once | INTRAVENOUS | Status: AC
  Filled 2020-04-05: qty 250

## 2020-04-05 MED ORDER — PALONOSETRON HCL INJECTION 0.25 MG/5ML
0.2500 mg | Freq: Once | INTRAVENOUS | Status: AC
  Administered 2020-04-05: 0.25 mg via INTRAVENOUS

## 2020-04-05 MED ORDER — SODIUM CHLORIDE 0.9% FLUSH
10.0000 mL | INTRAVENOUS | Status: DC | PRN
  Administered 2020-04-05: 10 mL
  Filled 2020-04-05: qty 10

## 2020-04-05 MED ORDER — HEPARIN SOD (PORK) LOCK FLUSH 100 UNIT/ML IV SOLN
500.0000 [IU] | Freq: Once | INTRAVENOUS | Status: AC | PRN
  Administered 2020-04-05: 500 [IU]
  Filled 2020-04-05: qty 5

## 2020-04-05 MED ORDER — DOXORUBICIN HCL CHEMO IV INJECTION 2 MG/ML
60.0000 mg/m2 | Freq: Once | INTRAVENOUS | Status: AC
  Administered 2020-04-05: 108 mg via INTRAVENOUS
  Filled 2020-04-05: qty 54

## 2020-04-05 MED ORDER — SODIUM CHLORIDE 0.9 % IV SOLN
600.0000 mg/m2 | Freq: Once | INTRAVENOUS | Status: AC
  Administered 2020-04-05: 1080 mg via INTRAVENOUS
  Filled 2020-04-05: qty 54

## 2020-04-05 MED ORDER — PALONOSETRON HCL INJECTION 0.25 MG/5ML
INTRAVENOUS | Status: AC
  Filled 2020-04-05: qty 5

## 2020-04-05 NOTE — Progress Notes (Signed)
Okay to treat with ALT 98, per Dr. Burr Medico.

## 2020-04-05 NOTE — Telephone Encounter (Signed)
Scheduled per 6/4 sch message. RN stacy stated pt is aware of appt.

## 2020-04-05 NOTE — Patient Instructions (Signed)
[name] Discharge Instructions for Patients Receiving Chemotherapy  Today you received the following chemotherapy agents:  Adriamycin & Cytoxan  To help prevent nausea and vomiting after your treatment, we encourage you to take your nausea medication as prescribed. If you develop nausea and vomiting that is not controlled by your nausea medication, call the clinic.   BELOW ARE SYMPTOMS THAT SHOULD BE REPORTED IMMEDIATELY:  *FEVER GREATER THAN 100.5 F  *CHILLS WITH OR WITHOUT FEVER  NAUSEA AND VOMITING THAT IS NOT CONTROLLED WITH YOUR NAUSEA MEDICATION  *UNUSUAL SHORTNESS OF BREATH  *UNUSUAL BRUISING OR BLEEDING  TENDERNESS IN MOUTH AND THROAT WITH OR WITHOUT PRESENCE OF ULCERS  *URINARY PROBLEMS  *BOWEL PROBLEMS  UNUSUAL RASH Items with * indicate a potential emergency and should be followed up as soon as possible.  Feel free to call the clinic should you have any questions or concerns. The clinic phone number is [phone].  Please show the [facility] at check-in to the Emergency Department and triage nurse.  Doxorubicin injection What is this medicine? DOXORUBICIN (dox oh ROO bi sin) is a chemotherapy drug. It is used to treat many kinds of cancer like leukemia, lymphoma, neuroblastoma, sarcoma, and Wilms' tumor. It is also used to treat bladder cancer, breast cancer, lung cancer, ovarian cancer, stomach cancer, and thyroid cancer. This medicine may be used for other purposes; ask your health care provider or pharmacist if you have questions. COMMON BRAND NAME(S): Adriamycin, Adriamycin PFS, Adriamycin RDF, Rubex What should I tell my health care provider before I take this medicine? They need to know if you have any of these conditions:  heart disease  history of low blood counts caused by a medicine  liver disease  recent or ongoing radiation therapy  an unusual or allergic reaction to doxorubicin, other chemotherapy agents, other  medicines, foods, dyes, or preservatives  pregnant or trying to get pregnant  breast-feeding How should I use this medicine? This drug is given as an infusion into a vein. It is administered in a hospital or clinic by a specially trained health care professional. If you have pain, swelling, burning or any unusual feeling around the site of your injection, tell your health care professional right away. Talk to your pediatrician regarding the use of this medicine in children. Special care may be needed. Overdosage: If you think you have taken too much of this medicine contact a poison control center or emergency room at once. NOTE: This medicine is only for you. Do not share this medicine with others. What if I miss a dose? It is important not to miss your dose. Call your doctor or health care professional if you are unable to keep an appointment. What may interact with this medicine? This medicine may interact with the following medications:  6-mercaptopurine  paclitaxel  phenytoin  St. John's Wort  trastuzumab  verapamil This list may not describe all possible interactions. Give your health care provider a list of all the medicines, herbs, non-prescription drugs, or dietary supplements you use. Also tell them if you smoke, drink alcohol, or use illegal drugs. Some items may interact with your medicine. What should I watch for while using this medicine? This drug may make you feel generally unwell. This is not uncommon, as chemotherapy can affect healthy cells as well as cancer cells. Report any side effects. Continue your course of treatment even though you feel ill unless your doctor tells you to stop. There is a maximum amount of  this medicine you should receive throughout your life. The amount depends on the medical condition being treated and your overall health. Your doctor will watch how much of this medicine you receive in your lifetime. Tell your doctor if you have taken this  medicine before. You may need blood work done while you are taking this medicine. Your urine may turn red for a few days after your dose. This is not blood. If your urine is dark or brown, call your doctor. In some cases, you may be given additional medicines to help with side effects. Follow all directions for their use. Call your doctor or health care professional for advice if you get a fever, chills or sore throat, or other symptoms of a cold or flu. Do not treat yourself. This drug decreases your body's ability to fight infections. Try to avoid being around people who are sick. This medicine may increase your risk to bruise or bleed. Call your doctor or health care professional if you notice any unusual bleeding. Talk to your doctor about your risk of cancer. You may be more at risk for certain types of cancers if you take this medicine. Do not become pregnant while taking this medicine or for 6 months after stopping it. Women should inform their doctor if they wish to become pregnant or think they might be pregnant. Men should not father a child while taking this medicine and for 6 months after stopping it. There is a potential for serious side effects to an unborn child. Talk to your health care professional or pharmacist for more information. Do not breast-feed an infant while taking this medicine. This medicine has caused ovarian failure in some women and reduced sperm counts in some men This medicine may interfere with the ability to have a child. Talk with your doctor or health care professional if you are concerned about your fertility. This medicine may cause a decrease in Co-Enzyme Q-10. You should make sure that you get enough Co-Enzyme Q-10 while you are taking this medicine. Discuss the foods you eat and the vitamins you take with your health care professional. What side effects may I notice from receiving this medicine? Side effects that you should report to your doctor or health care  professional as soon as possible:  allergic reactions like skin rash, itching or hives, swelling of the face, lips, or tongue  breathing problems  chest pain  fast or irregular heartbeat  low blood counts - this medicine may decrease the number of white blood cells, red blood cells and platelets. You may be at increased risk for infections and bleeding.  pain, redness, or irritation at site where injected  signs of infection - fever or chills, cough, sore throat, pain or difficulty passing urine  signs of decreased platelets or bleeding - bruising, pinpoint red spots on the skin, black, tarry stools, blood in the urine  swelling of the ankles, feet, hands  tiredness  weakness Side effects that usually do not require medical attention (report to your doctor or health care professional if they continue or are bothersome):  diarrhea  hair loss  mouth sores  nail discoloration or damage  nausea  red colored urine  vomiting This list may not describe all possible side effects. Call your doctor for medical advice about side effects. You may report side effects to FDA at 1-800-FDA-1088. Where should I keep my medicine? This drug is given in a hospital or clinic and will not be stored at home. NOTE:  This sheet is a summary. It may not cover all possible information. If you have questions about this medicine, talk to your doctor, pharmacist, or health care provider.  2020 Elsevier/Gold Standard (2017-06-02 11:01:26)  Cyclophosphamide Injection What is this medicine? CYCLOPHOSPHAMIDE (sye kloe FOSS fa mide) is a chemotherapy drug. It slows the growth of cancer cells. This medicine is used to treat many types of cancer like lymphoma, myeloma, leukemia, breast cancer, and ovarian cancer, to name a few. This medicine may be used for other purposes; ask your health care provider or pharmacist if you have questions. COMMON BRAND NAME(S): Cytoxan, Neosar What should I tell my health  care provider before I take this medicine? They need to know if you have any of these conditions:  heart disease  history of irregular heartbeat  infection  kidney disease  liver disease  low blood counts, like white cells, platelets, or red blood cells  on hemodialysis  recent or ongoing radiation therapy  scarring or thickening of the lungs  trouble passing urine  an unusual or allergic reaction to cyclophosphamide, other medicines, foods, dyes, or preservatives  pregnant or trying to get pregnant  breast-feeding How should I use this medicine? This drug is usually given as an injection into a vein or muscle or by infusion into a vein. It is administered in a hospital or clinic by a specially trained health care professional. Talk to your pediatrician regarding the use of this medicine in children. Special care may be needed. Overdosage: If you think you have taken too much of this medicine contact a poison control center or emergency room at once. NOTE: This medicine is only for you. Do not share this medicine with others. What if I miss a dose? It is important not to miss your dose. Call your doctor or health care professional if you are unable to keep an appointment. What may interact with this medicine?  amphotericin B  azathioprine  certain antivirals for HIV or hepatitis  certain medicines for blood pressure, heart disease, irregular heart beat  certain medicines that treat or prevent blood clots like warfarin  certain other medicines for cancer  cyclosporine  etanercept  indomethacin  medicines that relax muscles for surgery  medicines to increase blood counts  metronidazole This list may not describe all possible interactions. Give your health care provider a list of all the medicines, herbs, non-prescription drugs, or dietary supplements you use. Also tell them if you smoke, drink alcohol, or use illegal drugs. Some items may interact with your  medicine. What should I watch for while using this medicine? Your condition will be monitored carefully while you are receiving this medicine. You may need blood work done while you are taking this medicine. Drink water or other fluids as directed. Urinate often, even at night. Some products may contain alcohol. Ask your health care professional if this medicine contains alcohol. Be sure to tell all health care professionals you are taking this medicine. Certain medicines, like metronidazole and disulfiram, can cause an unpleasant reaction when taken with alcohol. The reaction includes flushing, headache, nausea, vomiting, sweating, and increased thirst. The reaction can last from 30 minutes to several hours. Do not become pregnant while taking this medicine or for 1 year after stopping it. Women should inform their health care professional if they wish to become pregnant or think they might be pregnant. Men should not father a child while taking this medicine and for 4 months after stopping it. There is potential   for serious side effects to an unborn child. Talk to your health care professional for more information. Do not breast-feed an infant while taking this medicine or for 1 week after stopping it. This medicine has caused ovarian failure in some women. This medicine may make it more difficult to get pregnant. Talk to your health care professional if you are concerned about your fertility. This medicine has caused decreased sperm counts in some men. This may make it more difficult to father a child. Talk to your health care professional if you are concerned about your fertility. Call your health care professional for advice if you get a fever, chills, or sore throat, or other symptoms of a cold or flu. Do not treat yourself. This medicine decreases your body's ability to fight infections. Try to avoid being around people who are sick. Avoid taking medicines that contain aspirin, acetaminophen,  ibuprofen, naproxen, or ketoprofen unless instructed by your health care professional. These medicines may hide a fever. Talk to your health care professional about your risk of cancer. You may be more at risk for certain types of cancer if you take this medicine. If you are going to need surgery or other procedure, tell your health care professional that you are using this medicine. Be careful brushing or flossing your teeth or using a toothpick because you may get an infection or bleed more easily. If you have any dental work done, tell your dentist you are receiving this medicine. What side effects may I notice from receiving this medicine? Side effects that you should report to your doctor or health care professional as soon as possible:  allergic reactions like skin rash, itching or hives, swelling of the face, lips, or tongue  breathing problems  nausea, vomiting  signs and symptoms of bleeding such as bloody or black, tarry stools; red or dark brown urine; spitting up blood or brown material that looks like coffee grounds; red spots on the skin; unusual bruising or bleeding from the eyes, gums, or nose  signs and symptoms of heart failure like fast, irregular heartbeat, sudden weight gain; swelling of the ankles, feet, hands  signs and symptoms of infection like fever; chills; cough; sore throat; pain or trouble passing urine  signs and symptoms of kidney injury like trouble passing urine or change in the amount of urine  signs and symptoms of liver injury like dark yellow or brown urine; general ill feeling or flu-like symptoms; light-colored stools; loss of appetite; nausea; right upper belly pain; unusually weak or tired; yellowing of the eyes or skin Side effects that usually do not require medical attention (report to your doctor or health care professional if they continue or are bothersome):  confusion  decreased hearing  diarrhea  facial flushing  hair  loss  headache  loss of appetite  missed menstrual periods  signs and symptoms of low red blood cells or anemia such as unusually weak or tired; feeling faint or lightheaded; falls  skin discoloration This list may not describe all possible side effects. Call your doctor for medical advice about side effects. You may report side effects to FDA at 1-800-FDA-1088. Where should I keep my medicine? This drug is given in a hospital or clinic and will not be stored at home. NOTE: This sheet is a summary. It may not cover all possible information. If you have questions about this medicine, talk to your doctor, pharmacist, or health care provider.  2020 Elsevier/Gold Standard (2019-07-24 09:53:29)

## 2020-04-06 ENCOUNTER — Inpatient Hospital Stay: Payer: BC Managed Care – PPO

## 2020-04-06 VITALS — BP 145/75 | HR 91 | Temp 98.7°F | Resp 17

## 2020-04-06 DIAGNOSIS — Z5111 Encounter for antineoplastic chemotherapy: Secondary | ICD-10-CM | POA: Diagnosis not present

## 2020-04-06 DIAGNOSIS — Z170 Estrogen receptor positive status [ER+]: Secondary | ICD-10-CM

## 2020-04-06 MED ORDER — PEGFILGRASTIM-JMDB 6 MG/0.6ML ~~LOC~~ SOSY
6.0000 mg | PREFILLED_SYRINGE | Freq: Once | SUBCUTANEOUS | Status: AC
  Administered 2020-04-06: 6 mg via SUBCUTANEOUS

## 2020-04-06 NOTE — Patient Instructions (Signed)

## 2020-04-08 ENCOUNTER — Encounter: Payer: Self-pay | Admitting: *Deleted

## 2020-04-09 ENCOUNTER — Other Ambulatory Visit: Payer: Self-pay

## 2020-04-09 DIAGNOSIS — B20 Human immunodeficiency virus [HIV] disease: Secondary | ICD-10-CM

## 2020-04-09 DIAGNOSIS — Z79899 Other long term (current) drug therapy: Secondary | ICD-10-CM

## 2020-04-09 DIAGNOSIS — Z113 Encounter for screening for infections with a predominantly sexual mode of transmission: Secondary | ICD-10-CM

## 2020-04-10 ENCOUNTER — Encounter: Payer: Self-pay | Admitting: Internal Medicine

## 2020-04-11 ENCOUNTER — Encounter: Payer: Self-pay | Admitting: *Deleted

## 2020-04-15 ENCOUNTER — Ambulatory Visit: Payer: BC Managed Care – PPO | Attending: Internal Medicine

## 2020-04-15 DIAGNOSIS — Z23 Encounter for immunization: Secondary | ICD-10-CM

## 2020-04-15 NOTE — Progress Notes (Signed)
   Covid-19 Vaccination Clinic  Name:  Victoria Coffey    MRN: [id number] DOB: 1956/03/03  04/15/2020  [name] was observed post Covid-19 immunization for 15 minutes without incident. She was provided with Vaccine Information Sheet and instruction to access the V-Safe system.   [name] was instructed to call 911 with any severe reactions post vaccine: Marland Kitchen Difficulty breathing  . Swelling of face and throat  . A fast heartbeat  . A bad rash all over body  . Dizziness and weakness   Immunizations Administered    Name Date Dose VIS Date Route   Pfizer COVID-19 Vaccine 04/15/2020  8:15 AM 0.3 mL 12/27/2018 Intramuscular   Manufacturer: [employer], [employer]   Lot: [id number]   [place]: [id number]

## 2020-04-16 NOTE — Progress Notes (Signed)
[place] OFFICE PROGRESS NOTE  [name], MD [place] [address] [place] [address]  DIAGNOSIS: F/u of left breast cancer  Oncology History  Malignant neoplasm of upper-outer quadrant of left breast in female, estrogen receptor positive ([place])  02/28/2020 Mammogram   Diagnostic Mammogram 02/28/20  IMPRESSION The 3.9 cm ill defined mass in the left breast posterior depth central 4.3cm to the nipple seen on the mediolateral oblique view onlt with associated difssue skin thickening is highly suspicious for malignancy.    03/01/2020 Cancer Staging   Staging form: Breast, AJCC 8th Edition - Clinical stage from 03/01/2020: Stage IV (cT4, cN0, pM1, G3, ER+, PR-, HER2-) - Signed by [name], MD on 03/06/2020   03/01/2020 Initial Biopsy   Diagnosis 03/01/20  1. Breast, left, needle core biopsy, 9 o'clock, 3-4cmfn - INVASIVE DUCTAL CARCINOMA. SEE NOTE 2. Lymph node, needle/core biopsy, right axilla - INVASIVE DUCTAL CARCINOMA. SEE NOTE Diagnosis Note 1. Invasive carcinoma measures 1.5 cm in greatest linear dimension and appears grade 3. Dr. [name] reviewed the case and concurs with the diagnosis. A breast prognostic profile (ER, PR, Ki-67 and HER2) is pending and will be reported in an addendum. Dr. [name] was notified on 03/04/2020. 2. Carcinoma measures 0.6 cm in greatest linear dimension and appears grade 3. Lymphoid tissue is not identified - this may represent a completely replaced lymph node. A breast prognostic profile (ER, PR, Ki-67 and HER2) is pending and will be reported in an addendum. Dr. [name] reviewed the case and concurs with the diagnosis. Dr. [name] was notified on 03/04/2020.   03/01/2020 Receptors her2   1. PROGNOSTIC INDICATORS Results: IMMUNOHISTOCHEMICAL AND MORPHOMETRIC ANALYSIS PERFORMED MANUALLY The tumor cells are NEGATIVE for Her2 (0). Estrogen Receptor: 20%, POSITIVE, WEAK STAINING INTENSITY Progesterone Receptor: 0%,  NEGATIVE Proliferation Marker Ki67: 40% COMMENT: The negative hormone receptor study(ies) in this case has an internal positive control.    2. PROGNOSTIC INDICATORS Results: IMMUNOHISTOCHEMICAL AND MORPHOMETRIC ANALYSIS PERFORMED MANUALLY The tumor cells are NEGATIVE for Her2 (1+). Estrogen Receptor: 10%, POSITIVE, WEAK STAINING INTENSITY Progesterone Receptor: 0%, NEGATIVE Proliferation Marker Ki67: 40% COMMENT: The negative hormone receptor study(ies) in this case has no internal positive control.   03/05/2020 Initial Diagnosis   Malignant neoplasm of upper-outer quadrant of left breast in female, estrogen receptor positive ([facility])   03/18/2020 Breast MRI   IMPRESSION: 1. 7 x 9 x 6 cm area of suspicious non masslike enhancement within the central/UPPER RIGHT breast with anterior skin thickening. Given biopsy-proven metastatic RIGHT axillary lymph node, tissue sampling of the anterior and posterior aspects of this non masslike RIGHT breast enhancement is recommended to exclude malignancy. 2. 7 x 6 x 5 cm diffuse masslike and nonmasslike enhancement within the majority of the remaining LEFT breast with diffuse LEFT breast skin thickening, compatible with malignancy. Abnormal enhancement is noted along the anterior aspect of the LEFT pectoralis muscle. 3. Three abnormal RIGHT axillary lymph nodes, 1 which is biopsy proven to be metastatic disease. No abnormal LEFT axillary lymph nodes identified.   03/20/2020 Echocardiogram   Baseline ECHO  IMPRESSIONS     1. The patient was reported to be in pain during the study and not able  to participate in this study. All that can be said is that the LV and RV  function are grossly normal. Would recommend to repeat this study when/if  the patient is able to tolerate  the exam.   2. Left ventricular ejection fraction, by estimation, is  60 to 65%. The  left ventricle has normal function. Left ventricular endocardial border  not optimally  defined to evaluate regional wall motion. Left ventricular  diastolic function could not be  evaluated.   3. Right ventricular systolic function is normal. The right ventricular  size is normal.   4. The mitral valve was not well visualized. No evidence of mitral valve  regurgitation.   5. The aortic valve was not well visualized. Aortic valve regurgitation  is not visualized.   6. The inferior vena cava is normal in size with greater than 50%  respiratory variability, suggesting right atrial pressure of 3 mmHg.    03/20/2020 Imaging   CT CAP W contrast  IMPRESSION: 1. Small right axillary/subpectoral lymph nodes. Difficult to exclude metastatic disease. 2. Subtle 6 mm low-attenuation lesion in the dome of the liver, not well seen previously. Lesion is too small to characterize. Further evaluation could be performed with MR abdomen without and with contrast, as clinically indicated. 3. Hepatic steatosis. 4. Aortic atherosclerosis (ICD10-I70.0). Coronary artery calcification.     03/20/2020 Imaging   Whole body bone scan  IMPRESSION: 1. Increased activity noted over the right breast, possibly related to the patient's right breast cancer.   2. Punctate area of increased activity over the left maxillary region, most likely related to sinus disease or dental disease. Increased activity noted over the left mandible also possibly related to dental disease. No other focal bony abnormalities to suggest metastatic disease identified.   03/22/2020 Procedure   PAC placement by Dr [name]    03/22/2020 -  Chemotherapy   AC q2weeks for 4 cycles starting 03/22/20 followed by weekly Taxol for 12 weeks.   03/22/2020 Pathology Results   FINAL MICROSCOPIC DIAGNOSIS:   A. BREAST, RIGHT, PUNCH BIOPSY:  - Carcinoma involving dermal lymphatics.  - See comment.   B. BREAST, LEFT, PUNCH BIOPSY:  - Carcinoma involving dermal lymphatics.  - See comment.   COMMENT:  The morphology is compatible  with ductal breast carcinoma.    ADDENDUM:   PROGNOSTIC INDICATOR RESULTS:   Immunohistochemical and morphometric analysis performed manually   The tumor cells are NEGATIVE for Her2 (0%).   Estrogen Receptor:       NEGATIVE, 0%  Progesterone Receptor:   NEGATIVE, 0%      CURRENT THERAPY: AC q2weeks for 4 cycles starting 03/22/20 followed by weekly Taxol for 12 weeks. Status pos C2 DC.   INTERVAL HISTORY: Leeana Creer [place] 64 y.o. female returns to the clinic for a follow up visit. The patient is feeling well today without any concerning complaints. The patient continues to tolerate treatment with doxorubicin and cyclophosphamide well without any adverse effects. Denies any fever, chills, or night sweats. She has lost a few pounds. According to the patient, she is intentionally trying to lose weight by walking more, eating less fatty foods, and consuming more vegetables. She does have a decreased appetite a few days after treatment in which she drinks broth. Denies any chest pain, shortness of breath, cough, or hemoptysis. She denies any symptoms of infection. Denies any nausea, abdominal pain, jaundice, bloating/distention, vomiting, diarrhea, or constipation. Denies any headache or visual changes. Denies any rashes or skin changes. Denies peripheral neuropathy. Denies bone pain. She was given a prescription for oxycodone following her biopsy and uses this sparingly, but is requesting a refill just in case she has pain. Take claritin for neulasta. Reports to drinking a gallon of water. Denies hematuria. The patient recently had an  MRI to evaluate the small indeterminate lesion in her live. The patient is here today for evaluation prior to starting cycle # 3     MEDICAL HISTORY: Past Medical History:  Diagnosis Date  . Breast cancer ([place]) dx'd 1993   left  . History of left breast cancer   . HIV infection ([place])   . Hypertension     ALLERGIES:  is allergic to codeine, other, grapefruit  bioflavonoid complex, pomegranate [punica], and shellfish allergy.  MEDICATIONS:  Current Outpatient Medications  Medication Sig Dispense Refill  . acetaminophen (TYLENOL) 500 MG tablet Take 1,000 mg by mouth every 6 (six) hours as needed for moderate pain.    Marland Kitchen emtricitabine-rilpivir-tenofovir AF (ODEFSEY) 200-25-25 MG TABS tablet Take 1 tablet by mouth daily with breakfast. 30 tablet 11  . hydrochlorothiazide (HYDRODIURIL) 25 MG tablet Take 1 tablet (25 mg total) by mouth daily. 30 tablet 11  . lidocaine-prilocaine (EMLA) cream Apply to affected area once 30 g 3  . ondansetron (ZOFRAN) 8 MG tablet Take 1 tablet (8 mg total) by mouth 2 (two) times daily as needed. Start on the third day after chemotherapy. 30 tablet 1  . oxyCODONE (OXY IR/ROXICODONE) 5 MG immediate release tablet Take 1 tablet (5 mg total) by mouth every 6 (six) hours as needed for severe pain. 15 tablet 0  . prochlorperazine (COMPAZINE) 10 MG tablet Take 1 tablet (10 mg total) by mouth every 6 (six) hours as needed (Nausea or vomiting). 30 tablet 1   No current facility-administered medications for this visit.    SURGICAL HISTORY:  Past Surgical History:  Procedure Laterality Date  . BREAST BIOPSY Bilateral 03/22/2020   Procedure: BILATERAL BREAST PUNCH BIOPSIES;  Surgeon: [name], MD;  Location: [facility];  Service: General;  Laterality: Bilateral;  . BREAST LUMPECTOMY    . CESAREAN SECTION    . left breast lumpectomy  1993  . PORTACATH PLACEMENT Right 03/22/2020   Procedure: INSERTION PORT-A-CATH WITH ULTRASOUND GUIDANCE;  Surgeon: [name], MD;  Location: [facility];  Service: General;  Laterality: Right;    REVIEW OF SYSTEMS:   Review of Systems  Constitutional: Positive for weight loss. Negative for appetite change, chills, fatigue, and fever.   HENT: Negative for mouth sores, nosebleeds, sore throat and trouble swallowing.   Eyes: Negative for eye problems and icterus.  Respiratory: Negative for cough,  hemoptysis, shortness of breath and wheezing.   Cardiovascular: Negative for chest pain and leg swelling.  Gastrointestinal: Negative for abdominal pain, constipation, diarrhea, nausea and vomiting.  Genitourinary: Negative for bladder incontinence, difficulty urinating, dysuria, frequency and hematuria.   Musculoskeletal: Negative for back pain, gait problem, neck pain and neck stiffness.  Skin: Negative for itching and rash.  Neurological: Negative for dizziness, extremity weakness, gait problem, headaches, light-headedness and seizures.  Hematological: Negative for adenopathy. Does not bruise/bleed easily.  Psychiatric/Behavioral: Negative for confusion, depression and sleep disturbance. The patient is not nervous/anxious.     PHYSICAL EXAMINATION:  Blood pressure 128/80, pulse 87, temperature (!) 97.3 F (36.3 C), temperature source Temporal, resp. rate 18, height _0  (1.549 m), weight 156 lb 1.6 oz (70.8 kg), SpO2 100 %.  ECOG PERFORMANCE STATUS: 1 - Symptomatic but completely ambulatory  Physical Exam  Constitutional: Oriented to person, place, and time and well-developed, well-nourished, and in no distress.  HENT:  Head: Normocephalic and atraumatic.  Mouth/Throat: Oropharynx is clear and moist. No oropharyngeal exudate.  Eyes: Conjunctivae are normal. Right eye exhibits no discharge. Left eye exhibits  no discharge. No scleral icterus.  Neck: Normal range of motion. Neck supple.  Cardiovascular: Normal rate, regular rhythm, normal heart sounds and intact distal pulses.   Pulmonary/Chest: Effort normal and breath sounds normal. No respiratory distress. No wheezes. No rales.  Abdominal: Soft. Bowel sounds are normal. Exhibits no distension and no mass. There is no tenderness.  Musculoskeletal: Normal range of motion. Exhibits no edema.  Lymphadenopathy:    No cervical adenopathy.  Neurological: Alert and oriented to person, place, and time. Exhibits normal muscle tone. Gait  normal. Coordination normal.  Skin: Skin is warm and dry. No rash noted. Not diaphoretic. No erythema. No pallor.  Psychiatric: Mood, memory and judgment normal.  BREAST: (+) Skin thickening of her left breast with hyperpigmentation (+) Right breast 9x6.5 cm palpable mass in RUQ with mild skin erythema. palpable right axillary LN (+) Right breast larger than left. Patient has bandage over left breast due to mild drainage this AM.  Vitals reviewed.  LABORATORY DATA: Lab Results  Component Value Date   WBC 5.7 04/19/2020   HGB 10.4 (L) 04/19/2020   HCT 31.3 (L) 04/19/2020   MCV 85.1 04/19/2020   PLT 295 04/19/2020      Chemistry      Component Value Date/Time   NA 136 04/19/2020 0810   K 3.5 04/19/2020 0810   CL 104 04/19/2020 0810   CO2 23 04/19/2020 0810   BUN 12 04/19/2020 0810   CREATININE 0.79 04/19/2020 0810   CREATININE 0.82 02/09/2020 0855      Component Value Date/Time   CALCIUM 9.1 04/19/2020 0810   ALKPHOS 253 (H) 04/19/2020 0810   AST 37 04/19/2020 0810   ALT 49 (H) 04/19/2020 0810   BILITOT <0.2 (L) 04/19/2020 0810       RADIOGRAPHIC STUDIES:  CT CHEST W CONTRAST  Result Date: 03/20/2020 CLINICAL DATA:  New right breast mass, breast cancer staging. Remote history of left breast cancer. EXAM: CT CHEST, ABDOMEN, AND PELVIS WITH CONTRAST TECHNIQUE: Multidetector CT imaging of the chest, abdomen and pelvis was performed following the standard protocol during bolus administration of intravenous contrast. CONTRAST:  163m OMNIPAQUE IOHEXOL 300 MG/ML  SOLN COMPARISON:  05/18/2005. FINDINGS: CT CHEST FINDINGS Cardiovascular: Coronary artery calcification. Heart is at the upper limits of normal in size. No pericardial effusion. Mediastinum/Nodes: No pathologically enlarged mediastinal, hilar or internal mammary lymph nodes. Right subpectoral and right axillary lymph nodes measure up to 8 mm. No left axillary adenopathy. Surgical clips are seen in the left axilla.  Lungs/Pleura: 4 mm lingular nodule (6/85), nonspecific and possibly due to mucoid impaction. Lungs are otherwise clear. No pleural fluid. Airway is unremarkable. Musculoskeletal: No worrisome lytic or sclerotic lesions. CT ABDOMEN PELVIS FINDINGS Hepatobiliary: Liver is slightly decreased in attenuation. There is a subtle 6 mm low-attenuation lesion in the dome of the liver (2/41), not well seen on 05/18/2005. Liver and gallbladder are otherwise unremarkable. No biliary ductal dilatation. Pancreas: Negative. Spleen: Negative. Adrenals/Urinary Tract: Adrenal glands are unremarkable. Scarring in the kidneys bilaterally. Subcentimeter low-attenuation lesion in the left kidney is too small to characterize but statistically, a cyst is likely. Kidneys are otherwise unremarkable. Ureters are decompressed. Bladder is fairly low in volume which may account for slight wall thickening. Stomach/Bowel: Stomach, small bowel, appendix and colon are unremarkable. Vascular/Lymphatic: Atherosclerotic calcification of the aorta without aneurysm. No pathologically enlarged lymph nodes. Reproductive: Mildly hyperattenuating 2.2 cm lesion in the uterus is likely a fibroid. No adnexal mass. Other: Mesenteries and peritoneum are unremarkable.  No free fluid. Musculoskeletal: Skin thickening is seen in both breasts. Old left inferior pubic ramus fracture. No worrisome lytic or sclerotic lesions. Degenerative changes in the spine. IMPRESSION: 1. Small right axillary/subpectoral lymph nodes. Difficult to exclude metastatic disease. 2. Subtle 6 mm low-attenuation lesion in the dome of the liver, not well seen previously. Lesion is too small to characterize. Further evaluation could be performed with MR abdomen without and with contrast, as clinically indicated. 3. Hepatic steatosis. 4. Aortic atherosclerosis (ICD10-I70.0). Coronary artery calcification. Electronically Signed   By: Lorin Picket M.D.   On: 03/20/2020 13:28   MR Abdomen W Wo  Contrast  Result Date: 04/17/2020 CLINICAL DATA:  64 year old female with history of liver lesion noted on prior CT examination. Follow-up study. History of breast cancer. Evaluate for metastatic disease. EXAM: MRI ABDOMEN WITHOUT AND WITH CONTRAST TECHNIQUE: Multiplanar multisequence MR imaging of the abdomen was performed both before and after the administration of intravenous contrast. CONTRAST:  35m GADAVIST GADOBUTROL 1 MMOL/ML IV SOLN COMPARISON:  No prior abdominal MRI. CT the abdomen and pelvis 03/20/2020. FINDINGS: Lower chest: Unremarkable. Hepatobiliary: No suspicious cystic or solid hepatic lesions. Specifically, no lesion identified in the superior aspect of segments 7 or 8 to correspond to the perceived abnormality on prior CT the abdomen and pelvis 03/20/2020. No intra or extrahepatic biliary ductal dilatation. Amorphous material demonstrating slightly high T1 signal intensity lying dependently in the gallbladder, compatible with biliary sludge. No discrete gallstones confidently identified. Gallbladder is only moderately distended. No gallbladder wall thickening. No pericholecystic fluid or surrounding inflammatory changes. Pancreas: No pancreatic mass. No pancreatic ductal dilatation. No pancreatic or peripancreatic fluid collections or inflammatory changes. Spleen:  Unremarkable. Adrenals/Urinary Tract: Subcentimeter, nonenhancing lesion in the T1 hypointense, T2 hyperintense medial aspect of the interpolar region of the left kidney, compatible with a tiny simple cyst. Right kidney and bilateral adrenal glands are normal in appearance. No hydroureteronephrosis in the visualized portions of the abdomen. Stomach/Bowel: Visualized portions are unremarkable. Vascular/Lymphatic: No aneurysm identified in the visualized abdominal vasculature. No lymphadenopathy noted in the abdomen. Other: No significant volume of ascites noted in the visualized portions of the peritoneal cavity. Musculoskeletal: No  aggressive appearing osseous lesions are noted in the visualized portions of the skeleton. IMPRESSION: 1. No suspicious hepatic lesions or signs of metastatic disease in the abdomen. 2. Biliary sludge in the gallbladder. No findings to suggest an acute cholecystitis at this time. Electronically Signed   By: DVinnie LangtonM.D.   On: 04/17/2020 10:38   NM Bone Scan Whole Body  Result Date: 03/21/2020 CLINICAL DATA:  Right breast cancer. EXAM: NUCLEAR MEDICINE WHOLE BODY BONE SCAN TECHNIQUE: Whole body anterior and posterior images were obtained approximately 3 hours after intravenous injection of radiopharmaceutical. RADIOPHARMACEUTICALS:  22.0 mCi Technetium-963mDP IV COMPARISON:  CT 03/20/2020. FINDINGS: Bilateral renal function and excretion. Increased soft tissue activity noted over the right breast. Increased activity noted over the left maxillary region. Although metastatic disease cannot be excluded these findings are most likely related to sinus disease or dental disease. Increased activity noted over the left mandible possibly related to dental disease. No other focal bony abnormalities to suggest metastatic disease are identified. IMPRESSION: 1. Increased activity noted over the right breast, possibly related to the patient's right breast cancer. 2. Punctate area of increased activity over the left maxillary region, most likely related to sinus disease or dental disease. Increased activity noted over the left mandible also possibly related to dental disease. No other focal bony  abnormalities to suggest metastatic disease identified. Electronically Signed   By: Marcello Moores  Register   On: 03/21/2020 05:18   CT ABDOMEN PELVIS W CONTRAST  Result Date: 03/20/2020 CLINICAL DATA:  New right breast mass, breast cancer staging. Remote history of left breast cancer. EXAM: CT CHEST, ABDOMEN, AND PELVIS WITH CONTRAST TECHNIQUE: Multidetector CT imaging of the chest, abdomen and pelvis was performed following the  standard protocol during bolus administration of intravenous contrast. CONTRAST:  115m OMNIPAQUE IOHEXOL 300 MG/ML  SOLN COMPARISON:  05/18/2005. FINDINGS: CT CHEST FINDINGS Cardiovascular: Coronary artery calcification. Heart is at the upper limits of normal in size. No pericardial effusion. Mediastinum/Nodes: No pathologically enlarged mediastinal, hilar or internal mammary lymph nodes. Right subpectoral and right axillary lymph nodes measure up to 8 mm. No left axillary adenopathy. Surgical clips are seen in the left axilla. Lungs/Pleura: 4 mm lingular nodule (6/85), nonspecific and possibly due to mucoid impaction. Lungs are otherwise clear. No pleural fluid. Airway is unremarkable. Musculoskeletal: No worrisome lytic or sclerotic lesions. CT ABDOMEN PELVIS FINDINGS Hepatobiliary: Liver is slightly decreased in attenuation. There is a subtle 6 mm low-attenuation lesion in the dome of the liver (2/41), not well seen on 05/18/2005. Liver and gallbladder are otherwise unremarkable. No biliary ductal dilatation. Pancreas: Negative. Spleen: Negative. Adrenals/Urinary Tract: Adrenal glands are unremarkable. Scarring in the kidneys bilaterally. Subcentimeter low-attenuation lesion in the left kidney is too small to characterize but statistically, a cyst is likely. Kidneys are otherwise unremarkable. Ureters are decompressed. Bladder is fairly low in volume which may account for slight wall thickening. Stomach/Bowel: Stomach, small bowel, appendix and colon are unremarkable. Vascular/Lymphatic: Atherosclerotic calcification of the aorta without aneurysm. No pathologically enlarged lymph nodes. Reproductive: Mildly hyperattenuating 2.2 cm lesion in the uterus is likely a fibroid. No adnexal mass. Other: Mesenteries and peritoneum are unremarkable.  No free fluid. Musculoskeletal: Skin thickening is seen in both breasts. Old left inferior pubic ramus fracture. No worrisome lytic or sclerotic lesions. Degenerative changes  in the spine. IMPRESSION: 1. Small right axillary/subpectoral lymph nodes. Difficult to exclude metastatic disease. 2. Subtle 6 mm low-attenuation lesion in the dome of the liver, not well seen previously. Lesion is too small to characterize. Further evaluation could be performed with MR abdomen without and with contrast, as clinically indicated. 3. Hepatic steatosis. 4. Aortic atherosclerosis (ICD10-I70.0). Coronary artery calcification. Electronically Signed   By: MLorin PicketM.D.   On: 03/20/2020 13:28   DG CHEST PORT 1 VIEW  Result Date: 03/22/2020 CLINICAL DATA:  Port-A-Cath insertion.  Breast carcinoma EXAM: PORTABLE CHEST 1 VIEW COMPARISON:  Chest CT Mar 20, 2020 FINDINGS: Port-A-Cath tip is in the superior vena cava. No pneumothorax. Lungs are clear. Heart size and pulmonary vascularity are normal. No adenopathy. Surgical clips are noted in the left axilla. No bone lesions. IMPRESSION: Port-A-Cath tip in superior vena cava. No pneumothorax. Lungs clear. Postoperative change on the left. Cardiac silhouette within normal limits. Electronically Signed   By: WLowella GripIII M.D.   On: 03/22/2020 09:32   DG Fluoro Guide CV Line-No Report  Result Date: 03/22/2020 Fluoroscopy was utilized by the requesting physician.  No radiographic interpretation.   ECHOCARDIOGRAM COMPLETE  Result Date: 03/20/2020    ECHOCARDIOGRAM REPORT   Patient Name:   NKELSYE LOOMERDate of Exam: 03/20/2020 Medical Rec #:  [id number]    Height:       61.0 in Accession #:    [id number]   Weight:  162.1 lb Date of Birth:  02-04-1956     BSA:          1.727 m Patient Age:    10 years      BP:           135/97 mmHg Patient Gender: F             HR:           76 bpm. Exam Location:  Outpatient Procedure: 2D Echo, Cardiac Doppler and Color Doppler Indications:    Z51.11 Encounter for antineoplastic chemotheraphy  History:        Patient has no prior history of Echocardiogram examinations.                 Risk  Factors:Hypertension. Breast cancer.  Sonographer:    Roseanna Rainbow Referring Phys: [id number] [facility]  Sonographer Comments: Suboptimal parasternal window, suboptimal subcostal window, no apical window, Technically challenging study due to limited acoustic windows and Technically difficult study due to poor echo windows. Recent biopsy and pain in apical. Patient had pain in parasternal and apical region due to chest trauma and recent biopsy. IMPRESSIONS  1. The patient was reported to be in pain during the study and not able to participate in this study. All that can be said is that the LV and RV function are grossly normal. Would recommend to repeat this study when/if the patient is able to tolerate the exam.  2. Left ventricular ejection fraction, by estimation, is 60 to 65%. The left ventricle has normal function. Left ventricular endocardial border not optimally defined to evaluate regional wall motion. Left ventricular diastolic function could not be evaluated.  3. Right ventricular systolic function is normal. The right ventricular size is normal.  4. The mitral valve was not well visualized. No evidence of mitral valve regurgitation.  5. The aortic valve was not well visualized. Aortic valve regurgitation is not visualized.  6. The inferior vena cava is normal in size with greater than 50% respiratory variability, suggesting right atrial pressure of 3 mmHg. FINDINGS  Left Ventricle: Left ventricular ejection fraction, by estimation, is 60 to 65%. The left ventricle has normal function. Left ventricular endocardial border not optimally defined to evaluate regional wall motion. The left ventricular internal cavity size was normal in size. There is no left ventricular hypertrophy. Left ventricular diastolic function could not be evaluated. Right Ventricle: The right ventricular size is normal. No increase in right ventricular wall thickness. Right ventricular systolic function is normal. Left Atrium: Left atrial size  was normal in size. Right Atrium: Right atrial size was normal in size. Pericardium: Trivial pericardial effusion is present. Presence of pericardial fat pad. Mitral Valve: The mitral valve was not well visualized. No evidence of mitral valve regurgitation. Tricuspid Valve: The tricuspid valve is not well visualized. Tricuspid valve regurgitation is not demonstrated. Aortic Valve: The aortic valve was not well visualized. Aortic valve regurgitation is not visualized. Pulmonic Valve: The pulmonic valve was not well visualized. Pulmonic valve regurgitation is not visualized. Aorta: The aortic root and ascending aorta are structurally normal, with no evidence of dilitation. Venous: The inferior vena cava is normal in size with greater than 50% respiratory variability, suggesting right atrial pressure of 3 mmHg. IAS/Shunts: The atrial septum is grossly normal.  LEFT VENTRICLE PLAX 2D LVIDd:         3.00 cm     Diastology LVIDs:         2.00 cm  LV e' medial:   4.03 cm/s LV PW:         1.10 cm     LV E/e' medial: 13.2 LV IVS:        1.10 cm LVOT diam:     1.70 cm LVOT Area:     2.27 cm  LV Volumes (MOD) LV vol d, MOD A4C: 56.7 ml LV vol s, MOD A4C: 24.3 ml LV SV MOD A4C:     56.7 ml IVC IVC diam: 1.80 cm LEFT ATRIUM           Index       RIGHT ATRIUM          Index LA diam:      3.10 cm 1.79 cm/m  RA Area:     9.60 cm LA Vol (A4C): 22.0 ml 12.74 ml/m RA Volume:   18.90 ml 10.94 ml/m   AORTA Ao Root diam: 2.60 cm Ao Asc diam:  2.80 cm MITRAL VALVE MV Area (PHT): 3.65 cm    SHUNTS MV Decel Time: 208 msec    Systemic Diam: 1.70 cm MV E velocity: 53.30 cm/s MV A velocity: 54.20 cm/s MV E/A ratio:  0.98 Eleonore Chiquito MD Electronically signed by Eleonore Chiquito MD Signature Date/Time: 03/20/2020/6:09:37 PM    Final      ASSESSMENT/PLAN:  Kyung Muto Eunice is a 64 y.o. 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 Based on initial exam sheprobably hasskin involvementand her entire left breast is occupied by cancer. Her mammogram also showed right LN enlargement. 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 It is not definitive if her right LN are related to her left breast cancer, or if she hasasecond right breast cancer -Her breast MRIfrom 03/18/20 showed left known breast cancer, she also has a large non-mass-like enhancement in the central upper right breast, highly suspicious for malignancy. Dr. [name] did punch skin biopsy of both right and left breaston 03/22/20, whichallcame back positive for carcinoma with ER/PR/HER2 negative markers.At that time, it was discussed the option of right breast biopsy,versus right mastectomy. Patient would like to proceed with bilateral mastectomy. No need for right breast mass biopsy in this case.  -Her 5/19/21Bone scan was negative for mets. Her CT scan shows 53m low density liver lesion, which appears benign to Dr. FBurr Medicobut indeterminate. Dr. FBurr Medicorecommended MRI  -MRI performed on 04/17/20 which did not show any metastatic disease.  -Dr. FBurr Medicostarted her on neoadjuvant chemotherapy as first-line treatmentwithAdriamycin and Cytoxan q2weeks for4 cycles starting 03/22/20, followed by weekly carboplatin and Taxol for 12 weeksto downstage her cancer andhopefully she will become a candidate for surgical resection. -She had her PAC placed on 03/22/20.Baseline ECHO normal.  -S/p C1 her physical exam shows right breast mass 9x7cm and right axillary LN 2cm and with her right breast larger than left due to her mass. Will monitor with each cycle of chemo to monitor clinical response.  -S/P C2 her breast exam shows right breast mass 9 x 6.75 cm  and right axillary LN 2 cm with her right breast larger than left due to her mass.  -Dr. FBurr Medicopreviously suggested she use prosthetic bra due to asymmetrical breasts.  -Labs reviewed, Hg 10.4.  Overall adequate to proceed with C3 AC today  -F/u in 2 weeks   2. H/o Left breast cancer, Dx in 1993 -Treated at [facility] in [place] by Dr. Sonny Dandy and surgeon Dr Weyman Rodney. She was treated with left lumpectomy with SNLB and cancer was in the LN. She denies lymphedema. Shehad adjuvant chemo (probably CMF), Adjuvant Radiation and Tamoxifen for 5 years.  3. Genetic Testing  -She has not family history of cancer but this is her second breast cancer so she is eligible for genetic testing. She is agreeable. Dr. Burr Medico sent referral.  -Her husband has a high rate of breast cancer in his family. Dr. Burr Medico recommend their daughter starts breast Mammogram, and if needed MRIs, given the early age of the patient's first breast cancer.  4. Comorbidities: HIV(+), HTN -She was diagnosed in 1993. Her HIV has been undetectable and well controlled. She is currently being seen by ID Dr Graylon Good  -She is on HCTZ for her HTN   5.Mild transaminitis -Unclear etiology, presents before chemotherapy -LFTs stable.  -MRI abdomen No suspicious hepatic lesions or signs of metastatic disease in the abdomen. No etiology for mild transaminitis.     PLAN: -Labs reviewed and adequate to proceed with C3 AC today  -Lab, flush, F/u and AC in 2 weeks.  -Refill compazine -Patient requested oxycodone refill in case she experiences pain. No pain at this time. Discussed we recommend that she use tylenol if needed for pain.       No orders of the defined types were placed in this encounter.    Adonus Uselman L Lihanna Biever, PA-C 04/19/20

## 2020-04-17 ENCOUNTER — Ambulatory Visit (HOSPITAL_COMMUNITY)
Admission: RE | Admit: 2020-04-17 | Discharge: 2020-04-17 | Disposition: A | Discharge status: Home / Self Care | Payer: BC Managed Care – PPO | Source: Ambulatory Visit | Attending: Hematology | Admitting: Hematology

## 2020-04-17 ENCOUNTER — Other Ambulatory Visit: Payer: Self-pay

## 2020-04-17 ENCOUNTER — Encounter: Payer: Self-pay | Admitting: *Deleted

## 2020-04-17 DIAGNOSIS — K769 Liver disease, unspecified: Secondary | ICD-10-CM

## 2020-04-17 MED ORDER — GADOBUTROL 1 MMOL/ML IV SOLN
7.0000 mL | Freq: Once | INTRAVENOUS | Status: AC | PRN
  Administered 2020-04-17: 7 mL via INTRAVENOUS

## 2020-04-19 ENCOUNTER — Other Ambulatory Visit: Payer: BC Managed Care – PPO

## 2020-04-19 ENCOUNTER — Inpatient Hospital Stay: Payer: BC Managed Care – PPO

## 2020-04-19 ENCOUNTER — Other Ambulatory Visit: Payer: Self-pay

## 2020-04-19 ENCOUNTER — Ambulatory Visit: Payer: BC Managed Care – PPO

## 2020-04-19 ENCOUNTER — Inpatient Hospital Stay (HOSPITAL_BASED_OUTPATIENT_CLINIC_OR_DEPARTMENT_OTHER): Payer: BC Managed Care – PPO | Admitting: Physician Assistant

## 2020-04-19 ENCOUNTER — Ambulatory Visit: Payer: BC Managed Care – PPO | Admitting: Physician Assistant

## 2020-04-19 VITALS — BP 128/80 | HR 87 | Temp 97.3°F | Resp 18 | Ht 61.0 in | Wt 156.1 lb

## 2020-04-19 DIAGNOSIS — Z170 Estrogen receptor positive status [ER+]: Secondary | ICD-10-CM

## 2020-04-19 DIAGNOSIS — C50412 Malignant neoplasm of upper-outer quadrant of left female breast: Secondary | ICD-10-CM

## 2020-04-19 DIAGNOSIS — Z95828 Presence of other vascular implants and grafts: Secondary | ICD-10-CM

## 2020-04-19 DIAGNOSIS — Z5111 Encounter for antineoplastic chemotherapy: Secondary | ICD-10-CM | POA: Diagnosis not present

## 2020-04-19 LAB — CBC WITH DIFFERENTIAL (CANCER CENTER ONLY)
Abs Immature Granulocytes: 0.1 10*3/uL — ABNORMAL HIGH (ref 0.00–0.07)
Basophils Absolute: 0 10*3/uL (ref 0.0–0.1)
Basophils Relative: 0 %
Eosinophils Absolute: 0 10*3/uL (ref 0.0–0.5)
Eosinophils Relative: 0 %
HCT: 31.3 % — ABNORMAL LOW (ref 36.0–46.0)
Hemoglobin: 10.4 g/dL — ABNORMAL LOW (ref 12.0–15.0)
Immature Granulocytes: 2 %
Lymphocytes Relative: 20 %
Lymphs Abs: 1.1 10*3/uL (ref 0.7–4.0)
MCH: 28.3 pg (ref 26.0–34.0)
MCHC: 33.2 g/dL (ref 30.0–36.0)
MCV: 85.1 fL (ref 80.0–100.0)
Monocytes Absolute: 0.8 10*3/uL (ref 0.1–1.0)
Monocytes Relative: 13 %
Neutro Abs: 3.8 10*3/uL (ref 1.7–7.7)
Neutrophils Relative %: 65 %
Platelet Count: 295 10*3/uL (ref 150–400)
RBC: 3.68 MIL/uL — ABNORMAL LOW (ref 3.87–5.11)
RDW: 16.4 % — ABNORMAL HIGH (ref 11.5–15.5)
WBC Count: 5.7 10*3/uL (ref 4.0–10.5)
nRBC: 0 % (ref 0.0–0.2)

## 2020-04-19 LAB — CMP (CANCER CENTER ONLY)
ALT: 49 U/L — ABNORMAL HIGH (ref 0–44)
AST: 37 U/L (ref 15–41)
Albumin: 3.5 g/dL (ref 3.5–5.0)
Alkaline Phosphatase: 253 U/L — ABNORMAL HIGH (ref 38–126)
Anion gap: 9 (ref 5–15)
BUN: 12 mg/dL (ref 8–23)
CO2: 23 mmol/L (ref 22–32)
Calcium: 9.1 mg/dL (ref 8.9–10.3)
Chloride: 104 mmol/L (ref 98–111)
Creatinine: 0.79 mg/dL (ref 0.44–1.00)
GFR, Est AFR Am: >60 mL/min (ref >60)
GFR, Estimated: >60 mL/min (ref >60)
Glucose, Bld: 102 mg/dL — ABNORMAL HIGH (ref 70–99)
Potassium: 3.5 mmol/L (ref 3.5–5.1)
Sodium: 136 mmol/L (ref 135–145)
Total Bilirubin: <0.2 mg/dL — ABNORMAL LOW (ref 0.3–1.2)
Total Protein: 7.1 g/dL (ref 6.5–8.1)

## 2020-04-19 MED ORDER — PALONOSETRON HCL INJECTION 0.25 MG/5ML
INTRAVENOUS | Status: AC
  Filled 2020-04-19: qty 5

## 2020-04-19 MED ORDER — SODIUM CHLORIDE 0.9 % IV SOLN
Freq: Once | INTRAVENOUS | Status: AC
  Filled 2020-04-19: qty 250

## 2020-04-19 MED ORDER — SODIUM CHLORIDE 0.9 % IV SOLN
150.0000 mg | Freq: Once | INTRAVENOUS | Status: AC
  Administered 2020-04-19: 150 mg via INTRAVENOUS
  Filled 2020-04-19: qty 150

## 2020-04-19 MED ORDER — SODIUM CHLORIDE 0.9 % IV SOLN
600.0000 mg/m2 | Freq: Once | INTRAVENOUS | Status: AC
  Administered 2020-04-19: 1080 mg via INTRAVENOUS
  Filled 2020-04-19: qty 54

## 2020-04-19 MED ORDER — PROCHLORPERAZINE MALEATE 10 MG PO TABS: 10.0000 mg | ORAL_TABLET | Freq: Four times a day (QID) | ORAL | 2 refills | Status: DC | PRN

## 2020-04-19 MED ORDER — SODIUM CHLORIDE 0.9 % IV SOLN
10.0000 mg | Freq: Once | INTRAVENOUS | Status: AC
  Administered 2020-04-19: 10 mg via INTRAVENOUS
  Filled 2020-04-19: qty 10

## 2020-04-19 MED ORDER — PALONOSETRON HCL INJECTION 0.25 MG/5ML
0.2500 mg | Freq: Once | INTRAVENOUS | Status: AC
  Administered 2020-04-19: 0.25 mg via INTRAVENOUS

## 2020-04-19 MED ORDER — SODIUM CHLORIDE 0.9% FLUSH
10.0000 mL | Freq: Once | INTRAVENOUS | Status: AC
  Administered 2020-04-19: 10 mL
  Filled 2020-04-19: qty 10

## 2020-04-19 MED ORDER — HEPARIN SOD (PORK) LOCK FLUSH 100 UNIT/ML IV SOLN
500.0000 [IU] | Freq: Once | INTRAVENOUS | Status: AC | PRN
  Administered 2020-04-19: 500 [IU]
  Filled 2020-04-19: qty 5

## 2020-04-19 MED ORDER — SODIUM CHLORIDE 0.9% FLUSH
10.0000 mL | INTRAVENOUS | Status: DC | PRN
  Administered 2020-04-19: 10 mL
  Filled 2020-04-19: qty 10

## 2020-04-19 MED ORDER — DOXORUBICIN HCL CHEMO IV INJECTION 2 MG/ML
60.0000 mg/m2 | Freq: Once | INTRAVENOUS | Status: AC
  Administered 2020-04-19: 108 mg via INTRAVENOUS
  Filled 2020-04-19: qty 54

## 2020-04-19 NOTE — Patient Instructions (Signed)
[facility] Discharge Instructions for Patients Receiving Chemotherapy  Today you received the following chemotherapy agents: Adriamycin, Doxorubicin   To help prevent nausea and vomiting after your treatment, we encourage you to take your nausea medication as directed.    If you develop nausea and vomiting that is not controlled by your nausea medication, call the clinic.   BELOW ARE SYMPTOMS THAT SHOULD BE REPORTED IMMEDIATELY:  *FEVER GREATER THAN 100.5 F  *CHILLS WITH OR WITHOUT FEVER  NAUSEA AND VOMITING THAT IS NOT CONTROLLED WITH YOUR NAUSEA MEDICATION  *UNUSUAL SHORTNESS OF BREATH  *UNUSUAL BRUISING OR BLEEDING  TENDERNESS IN MOUTH AND THROAT WITH OR WITHOUT PRESENCE OF ULCERS  *URINARY PROBLEMS  *BOWEL PROBLEMS  UNUSUAL RASH Items with * indicate a potential emergency and should be followed up as soon as possible.  Feel free to call the clinic should you have any questions or concerns. The clinic phone number is [phone].  Please show the [facility] at check-in to the Emergency Department and triage nurse.

## 2020-04-20 ENCOUNTER — Inpatient Hospital Stay: Payer: BC Managed Care – PPO

## 2020-04-20 VITALS — BP 120/65 | HR 106 | Temp 98.3°F | Resp 18

## 2020-04-20 DIAGNOSIS — Z5111 Encounter for antineoplastic chemotherapy: Secondary | ICD-10-CM | POA: Diagnosis not present

## 2020-04-20 DIAGNOSIS — Z170 Estrogen receptor positive status [ER+]: Secondary | ICD-10-CM

## 2020-04-20 LAB — CANCER ANTIGEN 27.29: CA 27.29: 364 U/mL — ABNORMAL HIGH (ref 0.0–38.6)

## 2020-04-20 MED ORDER — PEGFILGRASTIM-JMDB 6 MG/0.6ML ~~LOC~~ SOSY
PREFILLED_SYRINGE | SUBCUTANEOUS | Status: AC
  Filled 2020-04-20: qty 0.6

## 2020-04-20 MED ORDER — PEGFILGRASTIM-JMDB 6 MG/0.6ML ~~LOC~~ SOSY
6.0000 mg | PREFILLED_SYRINGE | Freq: Once | SUBCUTANEOUS | Status: AC
  Administered 2020-04-20: 6 mg via SUBCUTANEOUS

## 2020-04-22 ENCOUNTER — Encounter: Payer: Self-pay | Admitting: Internal Medicine

## 2020-04-22 ENCOUNTER — Other Ambulatory Visit: Payer: Self-pay

## 2020-04-22 ENCOUNTER — Ambulatory Visit (INDEPENDENT_AMBULATORY_CARE_PROVIDER_SITE_OTHER): Payer: BC Managed Care – PPO | Admitting: Internal Medicine

## 2020-04-22 ENCOUNTER — Ambulatory Visit: Payer: BC Managed Care – PPO

## 2020-04-22 VITALS — BP 130/80 | HR 111 | Temp 98.0°F | Wt 152.0 lb

## 2020-04-22 DIAGNOSIS — B20 Human immunodeficiency virus [HIV] disease: Secondary | ICD-10-CM | POA: Diagnosis not present

## 2020-04-22 DIAGNOSIS — Z853 Personal history of malignant neoplasm of breast: Secondary | ICD-10-CM | POA: Diagnosis not present

## 2020-04-22 DIAGNOSIS — K5900 Constipation, unspecified: Secondary | ICD-10-CM

## 2020-04-22 DIAGNOSIS — R7401 Elevation of levels of liver transaminase levels: Secondary | ICD-10-CM | POA: Diagnosis not present

## 2020-04-22 NOTE — Progress Notes (Signed)
Patient ID: Victoria Coffey, female   DOB: June 25, 1956, 64 y.o.   MRN: [id number]  HPI 64yo F with HIV disease, hx of breast ca, now with relapse with Stage IV breast ca diagnosed in apri/may 2021, having q2weeks for 4 cycles starting 03/22/20 followed by weekly Taxol for 12 weeks with plans for mastectomy after chemo. Suffering with constipation. Bloating. Doing some purposeful weight loss with dieting. Chemo has affected taste buds.   No missing doses of odefsey, and takes with meals  Coming to terms with her late diagnosis of breast cancer in part not seeking routine care/covid/loss of insurance  Outpatient Encounter Medications as of 04/22/2020  Medication Sig  . acetaminophen (TYLENOL) 500 MG tablet Take 1,000 mg by mouth every 6 (six) hours as needed for moderate pain.  Marland Kitchen emtricitabine-rilpivir-tenofovir AF (ODEFSEY) 200-25-25 MG TABS tablet Take 1 tablet by mouth daily with breakfast.  . hydrochlorothiazide (HYDRODIURIL) 25 MG tablet Take 1 tablet (25 mg total) by mouth daily.  Marland Kitchen lidocaine-prilocaine (EMLA) cream Apply to affected area once  . ondansetron (ZOFRAN) 8 MG tablet Take 1 tablet (8 mg total) by mouth 2 (two) times daily as needed. Start on the third day after chemotherapy.  Marland Kitchen oxyCODONE (OXY IR/ROXICODONE) 5 MG immediate release tablet Take 1 tablet (5 mg total) by mouth every 6 (six) hours as needed for severe pain.  Marland Kitchen prochlorperazine (COMPAZINE) 10 MG tablet Take 1 tablet (10 mg total) by mouth every 6 (six) hours as needed (Nausea or vomiting).   No facility-administered encounter medications on file as of 04/22/2020.     Patient Active Problem List   Diagnosis Date Noted  . Port-A-Cath in place 04/19/2020  . Malignant neoplasm of upper-outer quadrant of left breast in female, estrogen receptor positive ([place]) 03/05/2020  . Rash and nonspecific skin eruption 11/08/2013  . History of breast cancer 12/20/2012  . HYPERTENSION, BENIGN ESSENTIAL 08/11/2007  . HIV DISEASE  04/14/2007     Health Maintenance Due  Topic Date Due  . TETANUS/TDAP  Never done  . COLONOSCOPY  Never done  . PAP SMEAR-Modifier  11/14/2011     Review of Systems 12 point ros is negative except what is mentioned above Physical Exam  BP 130/80   Pulse (!) 111   Temp 98 F (36.7 C)   Wt 152 lb (68.9 kg)   BMI 28.72 kg/m  Physical Exam  Constitutional:  oriented to person, place, and time. appears well-developed and well-nourished. No distress.  HENT: [place]/AT, PERRLA, no scleral icterus Mouth/Throat: Oropharynx is clear and moist. No oropharyngeal exudate.  Cardiovascular: Normal rate, regular rhythm and normal heart sounds. Exam reveals no gallop and no friction rub.  No murmur heard.  Chest wall: port is nontender, no surrounding erythema Pulmonary/Chest: Effort normal and breath sounds normal. No respiratory distress.  has no wheezes.  Neck = supple, no nuchal rigidity Neurological: alert and oriented to person, place, and time.  Skin: Skin is warm and dry. No rash noted. No erythema.  Psychiatric: a normal mood and affect.  behavior is normal.    Lab Results  Component Value Date   CD4TCELL 37 02/09/2020   Lab Results  Component Value Date   CD4TABS 617 02/09/2020   CD4TABS 532 11/06/2019   CD4TABS 520 07/05/2019   Lab Results  Component Value Date   HIV1RNAQUANT 69 (H) 02/09/2020   Lab Results  Component Value Date   HEPBSAB NO 12/27/2006   Lab Results  Component Value Date  LABRPR NON-REACTIVE 03/13/2019    CBC Lab Results  Component Value Date   WBC 5.7 04/19/2020   RBC 3.68 (L) 04/19/2020   HGB 10.4 (L) 04/19/2020   HCT 31.3 (L) 04/19/2020   PLT 295 04/19/2020   MCV 85.1 04/19/2020   MCH 28.3 04/19/2020   MCHC 33.2 04/19/2020   RDW 16.4 (H) 04/19/2020   LYMPHSABS 1.1 04/19/2020   MONOABS 0.8 04/19/2020   EOSABS 0.0 04/19/2020    BMET Lab Results  Component Value Date   NA 136 04/19/2020   K 3.5 04/19/2020   CL 104 04/19/2020    CO2 23 04/19/2020   GLUCOSE 102 (H) 04/19/2020   BUN 12 04/19/2020   CREATININE 0.79 04/19/2020   CALCIUM 9.1 04/19/2020   GFRNONAA >60 04/19/2020   GFRAA >60 04/19/2020      Assessment and Plan  hiv disease= has low level viremia. Will check hiv viral load at cancer center when she has labs due on July 2nd  transamnitis = mildly elevated but trended down. Had mri abdomen last week, which was essentially normal hepatic/biliary system  Breast cancer = starting to do group support with dealing with her diagnosis. Once she finishes her infusion. Then she will undergo mastectomy  Constipation = will recommend smooth move tea, increase oral intake, increase fruit in her diet. Then can try, miralax if still unsuccessful.

## 2020-04-30 NOTE — Progress Notes (Signed)
[facility]   [phone] [phone]   Clinic Follow up Note   Patient Care Team: [name], MD as PCP - General (Infectious Diseases) [name], MD as PCP - Infectious Diseases (Infectious Diseases) [name], MD as Consulting Physician (General Surgery) [name], MD as Consulting Physician (Hematology) [name], MD as Consulting Physician (Radiation Oncology) [name], RN as Oncology Nurse Navigator [name], RN as Oncology Nurse Navigator  Date of Service:  05/03/2020  CHIEF COMPLAINT: F/u of left breast cancer  SUMMARY OF ONCOLOGIC HISTORY: Oncology History  Malignant neoplasm of upper-outer quadrant of left breast in female, estrogen receptor positive ([place])  02/28/2020 Mammogram   Diagnostic Mammogram 02/28/20  IMPRESSION The 3.9 cm ill defined mass in the left breast posterior depth central 4.3cm to the nipple seen on the mediolateral oblique view onlt with associated difssue skin thickening is highly suspicious for malignancy.    03/01/2020 Cancer Staging   Staging form: Breast, AJCC 8th Edition - Clinical stage from 03/01/2020: Stage IV (cT4, cN0, pM1, G3, ER+, PR-, HER2-) - Signed by [name], MD on 03/06/2020   03/01/2020 Initial Biopsy   Diagnosis 03/01/20  1. Breast, left, needle core biopsy, 9 o'clock, 3-4cmfn - INVASIVE DUCTAL CARCINOMA. SEE NOTE 2. Lymph node, needle/core biopsy, right axilla - INVASIVE DUCTAL CARCINOMA. SEE NOTE Diagnosis Note 1. Invasive carcinoma measures 1.5 cm in greatest linear dimension and appears grade 3. Dr. [name] reviewed the case and concurs with the diagnosis. A breast prognostic profile (ER, PR, Ki-67 and HER2) is pending and will be reported in an addendum. Dr. [name] was notified on 03/04/2020. 2. Carcinoma measures 0.6 cm in greatest linear dimension and appears grade 3. Lymphoid tissue is not identified - this may represent a completely replaced lymph node. A  breast prognostic profile (ER, PR, Ki-67 and HER2) is pending and will be reported in an addendum. Dr. [name] reviewed the case and concurs with the diagnosis. Dr. [name] was notified on 03/04/2020.   03/01/2020 Receptors her2   1. PROGNOSTIC INDICATORS Results: IMMUNOHISTOCHEMICAL AND MORPHOMETRIC ANALYSIS PERFORMED MANUALLY The tumor cells are NEGATIVE for Her2 (0). Estrogen Receptor: 20%, POSITIVE, WEAK STAINING INTENSITY Progesterone Receptor: 0%, NEGATIVE Proliferation Marker Ki67: 40% COMMENT: The negative hormone receptor study(ies) in this case has an internal positive control.    2. PROGNOSTIC INDICATORS Results: IMMUNOHISTOCHEMICAL AND MORPHOMETRIC ANALYSIS PERFORMED MANUALLY The tumor cells are NEGATIVE for Her2 (1+). Estrogen Receptor: 10%, POSITIVE, WEAK STAINING INTENSITY Progesterone Receptor: 0%, NEGATIVE Proliferation Marker Ki67: 40% COMMENT: The negative hormone receptor study(ies) in this case has no internal positive control.   03/05/2020 Initial Diagnosis   Malignant neoplasm of upper-outer quadrant of left breast in female, estrogen receptor positive ([place])   03/18/2020 Breast MRI   IMPRESSION: 1. 7 x 9 x 6 cm area of suspicious non masslike enhancement within the central/UPPER RIGHT breast with anterior skin thickening. Given biopsy-proven metastatic RIGHT axillary lymph node, tissue sampling of the anterior and posterior aspects of this non masslike RIGHT breast enhancement is recommended to exclude malignancy. 2. 7 x 6 x 5 cm diffuse masslike and nonmasslike enhancement within the majority of the remaining LEFT breast with diffuse LEFT breast skin thickening, compatible with malignancy. Abnormal enhancement is noted along the anterior aspect of the LEFT pectoralis muscle. 3. Three abnormal RIGHT axillary lymph nodes, 1 which is biopsy proven to be metastatic disease. No abnormal LEFT axillary lymph nodes identified.   03/20/2020 Echocardiogram    Baseline  ECHO  IMPRESSIONS     1. The patient was reported to be in pain during the study and not able  to participate in this study. All that can be said is that the LV and RV  function are grossly normal. Would recommend to repeat this study when/if  the patient is able to tolerate  the exam.   2. Left ventricular ejection fraction, by estimation, is 60 to 65%. The  left ventricle has normal function. Left ventricular endocardial border  not optimally defined to evaluate regional wall motion. Left ventricular  diastolic function could not be  evaluated.   3. Right ventricular systolic function is normal. The right ventricular  size is normal.   4. The mitral valve was not well visualized. No evidence of mitral valve  regurgitation.   5. The aortic valve was not well visualized. Aortic valve regurgitation  is not visualized.   6. The inferior vena cava is normal in size with greater than 50%  respiratory variability, suggesting right atrial pressure of 3 mmHg.    03/20/2020 Imaging   CT CAP W contrast  IMPRESSION: 1. Small right axillary/subpectoral lymph nodes. Difficult to exclude metastatic disease. 2. Subtle 6 mm low-attenuation lesion in the dome of the liver, not well seen previously. Lesion is too small to characterize. Further evaluation could be performed with MR abdomen without and with contrast, as clinically indicated. 3. Hepatic steatosis. 4. Aortic atherosclerosis (ICD10-I70.0). Coronary artery calcification.     03/20/2020 Imaging   Whole body bone scan  IMPRESSION: 1. Increased activity noted over the right breast, possibly related to the patient's right breast cancer.   2. Punctate area of increased activity over the left maxillary region, most likely related to sinus disease or dental disease. Increased activity noted over the left mandible also possibly related to dental disease. No other focal bony abnormalities to suggest metastatic disease  identified.   03/22/2020 Procedure   PAC placement by Dr [name]    03/22/2020 -  Chemotherapy   AC q2weeks for 4 cycles starting 03/22/20-05/03/20 followed by weekly Carboplatin and Taxol for 12 weeks starting 05/17/20   03/22/2020 Pathology Results   FINAL MICROSCOPIC DIAGNOSIS:   A. BREAST, RIGHT, PUNCH BIOPSY:  - Carcinoma involving dermal lymphatics.  - See comment.   B. BREAST, LEFT, PUNCH BIOPSY:  - Carcinoma involving dermal lymphatics.  - See comment.   COMMENT:  The morphology is compatible with ductal breast carcinoma.    ADDENDUM:   PROGNOSTIC INDICATOR RESULTS:   Immunohistochemical and morphometric analysis performed manually   The tumor cells are NEGATIVE for Her2 (0%).   Estrogen Receptor:       NEGATIVE, 0%  Progesterone Receptor:   NEGATIVE, 0%       CURRENT THERAPY:  AC q2weeks for 4 cycles starting 03/22/20-05/03/20 followed by weekly Carboplatin and Taxol for 12 weeks starting 05/17/20  INTERVAL HISTORY:  Victoria Coffey is here for a follow up and treatment. She presents to the clinic alone. She notes her latest cycle of chemo she experienced fatigue for first 2 days. She tries to rest those first few days and then on her better days she is able to do house work. She continues to take care of herself adequately as she recovers. She denies significant nausea or vomiting. She denies neuropathy but has darkening of her hand, fingernails and tongue. She feels mild change of her left breast mass.     REVIEW OF SYSTEMS:   Constitutional: Denies fevers, chills  or abnormal weight loss Eyes: Denies blurriness of vision Ears, nose, mouth, throat, and face: Denies mucositis or sore throat (+) Darkening of tongue  Respiratory: Denies cough, dyspnea or wheezes Cardiovascular: Denies palpitation, chest discomfort or lower extremity swelling Gastrointestinal:  Denies nausea, heartburn or change in bowel habits Skin: Denies abnormal skin rashes (+) Skin darkening    Lymphatics: Denies new lymphadenopathy or easy bruising Neurological:Denies numbness, tingling or new weaknesses Behavioral/Psych: Mood is stable, no new changes  All other systems were reviewed with the patient and are negative.  MEDICAL HISTORY:  Past Medical History:  Diagnosis Date  . Breast cancer ([place]) dx'd 1993   left  . History of left breast cancer   . HIV infection ([place])   . Hypertension     SURGICAL HISTORY: Past Surgical History:  Procedure Laterality Date  . BREAST BIOPSY Bilateral 03/22/2020   Procedure: BILATERAL BREAST PUNCH BIOPSIES;  Surgeon: [name], MD;  Location: [facility];  Service: General;  Laterality: Bilateral;  . BREAST LUMPECTOMY    . CESAREAN SECTION    . left breast lumpectomy  1993  . PORTACATH PLACEMENT Right 03/22/2020   Procedure: INSERTION PORT-A-CATH WITH ULTRASOUND GUIDANCE;  Surgeon: [name], MD;  Location: [facility];  Service: General;  Laterality: Right;    I have reviewed the social history and family history with the patient and they are unchanged from previous note.  ALLERGIES:  is allergic to codeine, other, grapefruit bioflavonoid complex, pomegranate [punica], and shellfish allergy.  MEDICATIONS:  Current Outpatient Medications  Medication Sig Dispense Refill  . acetaminophen (TYLENOL) 500 MG tablet Take 1,000 mg by mouth every 6 (six) hours as needed for moderate pain.    Marland Kitchen emtricitabine-rilpivir-tenofovir AF (ODEFSEY) 200-25-25 MG TABS tablet Take 1 tablet by mouth daily with breakfast. 30 tablet 11  . hydrochlorothiazide (HYDRODIURIL) 25 MG tablet Take 1 tablet (25 mg total) by mouth daily. 30 tablet 11  . lidocaine-prilocaine (EMLA) cream Apply to affected area once 30 g 3  . ondansetron (ZOFRAN) 8 MG tablet Take 1 tablet (8 mg total) by mouth 2 (two) times daily as needed. Start on the third day after chemotherapy. 30 tablet 1  . oxyCODONE (OXY IR/ROXICODONE) 5 MG immediate release tablet Take 1 tablet (5 mg total) by mouth  every 6 (six) hours as needed for severe pain. 15 tablet 0  . potassium chloride SA (KLOR-CON) 20 MEQ tablet Take 1 tablet (20 mEq total) by mouth 2 (two) times daily. Take 1 tab twice daily for 3 days then 1 tab once daily 60 tablet 1  . prochlorperazine (COMPAZINE) 10 MG tablet Take 1 tablet (10 mg total) by mouth every 6 (six) hours as needed (Nausea or vomiting). 30 tablet 2   No current facility-administered medications for this visit.   Facility-Administered Medications Ordered in Other Visits  Medication Dose Route Frequency Provider Last Rate Last Admin  . cyclophosphamide (CYTOXAN) 1,080 mg in sodium chloride 0.9 % 250 mL chemo infusion  600 mg/m2 (Treatment Plan Recorded) Intravenous Once [name], MD      . dexamethasone (DECADRON) 10 mg in sodium chloride 0.9 % 50 mL IVPB  10 mg Intravenous Once [name], MD 204 mL/hr at 05/03/20 0943 10 mg at 05/03/20 0943  . DOXOrubicin (ADRIAMYCIN) chemo injection 108 mg  60 mg/m2 (Treatment Plan Recorded) Intravenous Once [name], MD      . fosaprepitant (EMEND) 150 mg in sodium chloride 0.9 % 145 mL IVPB  150 mg Intravenous Once  [name], MD      . heparin lock flush 100 unit/mL  500 Units Intracatheter Once PRN [name], MD      . potassium chloride 10 mEq in 100 mL IVPB  10 mEq Intravenous Q1 Hr x 2 [name], MD      . sodium chloride flush (NS) 0.9 % injection 10 mL  10 mL Intracatheter PRN [name], MD        PHYSICAL EXAMINATION: ECOG PERFORMANCE STATUS: 1 - Symptomatic but completely ambulatory  Vitals:   05/03/20 0834  BP: 121/82  Pulse: 93  Resp: 17  Temp: 98.1 F (36.7 C)  SpO2: 100%   Filed Weights   05/03/20 0834  Weight: 151 lb (68.5 kg)    GENERAL:alert, no distress and comfortable SKIN: skin color, texture, turgor are normal, no rashes or significant lesions EYES: normal, Conjunctiva are pink and non-injected, sclera clear  NECK: supple, thyroid normal size, non-tender, without nodularity LYMPH:  no  palpable lymphadenopathy in the cervical, axillary  LUNGS: clear to auscultation and percussion with normal breathing effort HEART: regular rate & rhythm and no murmurs and no lower extremity edema ABDOMEN:abdomen soft, non-tender and normal bowel sounds Musculoskeletal:no cyanosis of digits and no clubbing  NEURO: alert & oriented x 3 with fluent speech, no focal motor/sensory deficits BREAST: (+) Skin thickening measuring 12x9cm of her left breast with hyperpigmentation, mostly stable without skin breakdown (+) Right breast palpable mass now 5.5x5cm (previously 9x7cm) in RUQ with skin erythema. (+) 2cm palpable right axillary LN, now softer (+) Right breast larger than left   LABORATORY DATA:  I have reviewed the data as listed CBC Latest Ref Rng & Units 05/03/2020 04/19/2020 04/05/2020  WBC 4.0 - 10.5 K/uL 8.5 5.7 7.1  Hemoglobin 12.0 - 15.0 g/dL 9.7(L) 10.4(L) 10.6(L)  Hematocrit 36 - 46 % 28.8(L) 31.3(L) 32.4(L)  Platelets 150 - 400 K/uL 325 295 279     CMP Latest Ref Rng & Units 05/03/2020 04/19/2020 04/05/2020  Glucose 70 - 99 mg/dL 110(H) 102(H) 101(H)  BUN 8 - 23 mg/dL '13 12 10  ' Creatinine 0.44 - 1.00 mg/dL 0.93 0.79 0.79  Sodium 135 - 145 mmol/L 140 136 140  Potassium 3.5 - 5.1 mmol/L 3.0(LL) 3.5 3.9  Chloride 98 - 111 mmol/L 104 104 107  CO2 22 - 32 mmol/L 26 23 21(L)  Calcium 8.9 - 10.3 mg/dL 9.0 9.1 8.9  Total Protein 6.5 - 8.1 g/dL 6.9 7.1 6.8  Total Bilirubin 0.3 - 1.2 mg/dL <0.2(L) <0.2(L) <0.2(L)  Alkaline Phos 38 - 126 U/L 174(H) 253(H) 239(H)  AST 15 - 41 U/L 24 37 63(H)  ALT 0 - 44 U/L 26 49(H) 98(H)      RADIOGRAPHIC STUDIES: I have personally reviewed the radiological images as listed and agreed with the findings in the report. No results found.   ASSESSMENT & PLAN:  Victoria Coffey is a 64 y.o. female with    1.Left breast cancer,cT4N0M1, possible stageIVwithinflammatory to skin of B/l breast (triple negative) and right breat cancer in [facility] with axillary  node metastasis, ERweakly+,PR/HER2-, Azucena Kuba -She was recently diagnosed with left breast cancer in 02/2020.Her Mammogram showed an at least 4cm mass of left breast. Based on initial exam sheprobably hasskin involvementand her entire left breast is occupied by cancer. Her mammogram also showed right LN enlargement. 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 It is not definitive if her right LN are related to  her left breast cancer, or if she hasasecond right breast cancer -Her breast MRIfrom 03/18/20 showed left known breast cancer, she also has a large non-mass-like enhancement in the central upper right breast, highly suspicious for malignancy. Dr. [name] did punch skin biopsy of both right and left breaston 03/22/20, whichallcame back positive for carcinoma with ER/PR/HER2 negative markers.We discussed the option of right breast biopsy,versus right mastectomy. Patient would like to proceed with bilateral mastectomy. No need for right breast mass biopsy in this case.  -staging scans were negative for distant metastasis  -I started her on neoadjuvant chemotherapy as first-line treatmentwithAdriamycin and Cytoxan q2weeks for4 cycles starting 03/22/20, followed by weekly carboplatin and Taxol for 12 weeksto downstage her cancer andhopefully she will become a candidate for surgical resection. -S/p C3 AC her right breast mass has showed clinical response, now 5.5x5cm, her right axillary LN has mild change but her left breast has remained overall unchanged. I discussed she is overall responding slower than anticipated, but no suspicion her cancer is progressing. I discussed adding Carboplatin to her weekly Taxol for more intensive treatment. She is agreeable. If she is still not clinically responding enough I will repeat MRI breast sooner.  -Labs reviewed and overall adequate to proceed with C4 AC today. Will start weekly CT in 2 weeks.  -F/u  in 2 weeks.    2. Anemia, secondary to chemo -Her Hg is 9.7 today (05/03/20). I discussed her Hg will likely drop further after her chemo today. If very low I will recommend blood transfusion. In the meantime she can start Prenatal Vitamin.   3. Weight Loss  -Her weight has been trending down. I recommend a high calorie, high protein diet to maintain weight. I will refer her to Dietician.   4. H/o Left breast cancer, Dx in 1993, Genetic Testing has not proceed yet.   5. Comorbidities: HIV(+) Dx 1993, HTN -Her HIV has been undetectable and well controlled. She is currently being seen by ID Dr Graylon Good   6.Mild transaminitis -Unclear etiology, presents before chemotherapy -Her LFTs have mildly progressed further. Her 04/17/20 MRI abdomen was benign.     PLAN: -Labs reviewed and adequate to proceed with C4 AC today  -Lab, flush, F/u with me or NP Cassie and chemo TC in 2, 3, 4 weeks    No problem-specific Assessment & Plan notes found for this encounter.   No orders of the defined types were placed in this encounter.  All questions were answered. The patient knows to call the clinic with any problems, questions or concerns. No barriers to learning was detected. The total time spent in the appointment was 30 minutes.     [name], MD 05/03/2020   I, Joslyn Devon, am acting as scribe for [name], MD.   I have reviewed the above documentation for accuracy and completeness, and I agree with the above.

## 2020-05-03 ENCOUNTER — Telehealth: Payer: Self-pay | Admitting: Hematology

## 2020-05-03 ENCOUNTER — Encounter: Payer: Self-pay | Admitting: Hematology

## 2020-05-03 ENCOUNTER — Other Ambulatory Visit: Payer: BC Managed Care – PPO

## 2020-05-03 ENCOUNTER — Ambulatory Visit: Payer: BC Managed Care – PPO

## 2020-05-03 ENCOUNTER — Inpatient Hospital Stay (HOSPITAL_BASED_OUTPATIENT_CLINIC_OR_DEPARTMENT_OTHER): Payer: BC Managed Care – PPO | Admitting: Hematology

## 2020-05-03 ENCOUNTER — Other Ambulatory Visit: Payer: Self-pay

## 2020-05-03 ENCOUNTER — Inpatient Hospital Stay: Payer: BC Managed Care – PPO

## 2020-05-03 ENCOUNTER — Ambulatory Visit: Payer: BC Managed Care – PPO | Admitting: Hematology

## 2020-05-03 ENCOUNTER — Inpatient Hospital Stay: Payer: BC Managed Care – PPO | Attending: Hematology

## 2020-05-03 VITALS — BP 121/82 | HR 93 | Temp 98.1°F | Resp 17 | Ht 61.0 in | Wt 151.0 lb

## 2020-05-03 DIAGNOSIS — Z8249 Family history of ischemic heart disease and other diseases of the circulatory system: Secondary | ICD-10-CM | POA: Diagnosis not present

## 2020-05-03 DIAGNOSIS — I10 Essential (primary) hypertension: Secondary | ICD-10-CM | POA: Diagnosis not present

## 2020-05-03 DIAGNOSIS — Z21 Asymptomatic human immunodeficiency virus [HIV] infection status: Secondary | ICD-10-CM | POA: Insufficient documentation

## 2020-05-03 DIAGNOSIS — R634 Abnormal weight loss: Secondary | ICD-10-CM | POA: Diagnosis not present

## 2020-05-03 DIAGNOSIS — Z79899 Other long term (current) drug therapy: Secondary | ICD-10-CM | POA: Insufficient documentation

## 2020-05-03 DIAGNOSIS — Z170 Estrogen receptor positive status [ER+]: Secondary | ICD-10-CM | POA: Insufficient documentation

## 2020-05-03 DIAGNOSIS — Z833 Family history of diabetes mellitus: Secondary | ICD-10-CM | POA: Diagnosis not present

## 2020-05-03 DIAGNOSIS — E876 Hypokalemia: Secondary | ICD-10-CM

## 2020-05-03 DIAGNOSIS — Z95828 Presence of other vascular implants and grafts: Secondary | ICD-10-CM

## 2020-05-03 DIAGNOSIS — C50412 Malignant neoplasm of upper-outer quadrant of left female breast: Secondary | ICD-10-CM

## 2020-05-03 DIAGNOSIS — Z5111 Encounter for antineoplastic chemotherapy: Secondary | ICD-10-CM | POA: Insufficient documentation

## 2020-05-03 DIAGNOSIS — Z853 Personal history of malignant neoplasm of breast: Secondary | ICD-10-CM | POA: Insufficient documentation

## 2020-05-03 DIAGNOSIS — R7401 Elevation of levels of liver transaminase levels: Secondary | ICD-10-CM | POA: Diagnosis not present

## 2020-05-03 DIAGNOSIS — D6481 Anemia due to antineoplastic chemotherapy: Secondary | ICD-10-CM | POA: Diagnosis not present

## 2020-05-03 LAB — CBC WITH DIFFERENTIAL (CANCER CENTER ONLY)
Abs Immature Granulocytes: 0.11 10*3/uL — ABNORMAL HIGH (ref 0.00–0.07)
Basophils Absolute: 0 10*3/uL (ref 0.0–0.1)
Basophils Relative: 0 %
Eosinophils Absolute: 0 10*3/uL (ref 0.0–0.5)
Eosinophils Relative: 0 %
HCT: 28.8 % — ABNORMAL LOW (ref 36.0–46.0)
Hemoglobin: 9.7 g/dL — ABNORMAL LOW (ref 12.0–15.0)
Immature Granulocytes: 1 %
Lymphocytes Relative: 18 %
Lymphs Abs: 1.5 10*3/uL (ref 0.7–4.0)
MCH: 29.1 pg (ref 26.0–34.0)
MCHC: 33.7 g/dL (ref 30.0–36.0)
MCV: 86.5 fL (ref 80.0–100.0)
Monocytes Absolute: 0.8 10*3/uL (ref 0.1–1.0)
Monocytes Relative: 9 %
Neutro Abs: 6.1 10*3/uL (ref 1.7–7.7)
Neutrophils Relative %: 72 %
Platelet Count: 325 10*3/uL (ref 150–400)
RBC: 3.33 MIL/uL — ABNORMAL LOW (ref 3.87–5.11)
RDW: 17.1 % — ABNORMAL HIGH (ref 11.5–15.5)
WBC Count: 8.5 10*3/uL (ref 4.0–10.5)
nRBC: 0 % (ref 0.0–0.2)

## 2020-05-03 LAB — CMP (CANCER CENTER ONLY)
ALT: 26 U/L (ref 0–44)
AST: 24 U/L (ref 15–41)
Albumin: 3.4 g/dL — ABNORMAL LOW (ref 3.5–5.0)
Alkaline Phosphatase: 174 U/L — ABNORMAL HIGH (ref 38–126)
Anion gap: 10 (ref 5–15)
BUN: 13 mg/dL (ref 8–23)
CO2: 26 mmol/L (ref 22–32)
Calcium: 9 mg/dL (ref 8.9–10.3)
Chloride: 104 mmol/L (ref 98–111)
Creatinine: 0.93 mg/dL (ref 0.44–1.00)
GFR, Est AFR Am: >60 mL/min (ref >60)
GFR, Estimated: >60 mL/min (ref >60)
Glucose, Bld: 110 mg/dL — ABNORMAL HIGH (ref 70–99)
Potassium: 3 mmol/L — CL (ref 3.5–5.1)
Sodium: 140 mmol/L (ref 135–145)
Total Bilirubin: <0.2 mg/dL — ABNORMAL LOW (ref 0.3–1.2)
Total Protein: 6.9 g/dL (ref 6.5–8.1)

## 2020-05-03 MED ORDER — SODIUM CHLORIDE 0.9 % IV SOLN
Freq: Once | INTRAVENOUS | Status: AC
  Filled 2020-05-03: qty 250

## 2020-05-03 MED ORDER — POTASSIUM CHLORIDE CRYS ER 20 MEQ PO TBCR
20.0000 meq | EXTENDED_RELEASE_TABLET | Freq: Two times a day (BID) | ORAL | 1 refills | Status: DC
Start: 2020-05-03 — End: 2020-05-31

## 2020-05-03 MED ORDER — DOXORUBICIN HCL CHEMO IV INJECTION 2 MG/ML
60.0000 mg/m2 | Freq: Once | INTRAVENOUS | Status: AC
  Administered 2020-05-03: 108 mg via INTRAVENOUS
  Filled 2020-05-03: qty 54

## 2020-05-03 MED ORDER — SODIUM CHLORIDE 0.9 % IV SOLN
10.0000 mg | Freq: Once | INTRAVENOUS | Status: AC
  Administered 2020-05-03: 10 mg via INTRAVENOUS
  Filled 2020-05-03: qty 10

## 2020-05-03 MED ORDER — SODIUM CHLORIDE 0.9 % IV SOLN
600.0000 mg/m2 | Freq: Once | INTRAVENOUS | Status: AC
  Administered 2020-05-03: 1080 mg via INTRAVENOUS
  Filled 2020-05-03: qty 54

## 2020-05-03 MED ORDER — PALONOSETRON HCL INJECTION 0.25 MG/5ML
INTRAVENOUS | Status: AC
  Filled 2020-05-03: qty 5

## 2020-05-03 MED ORDER — SODIUM CHLORIDE 0.9% FLUSH
10.0000 mL | Freq: Once | INTRAVENOUS | Status: AC
  Administered 2020-05-03: 10 mL
  Filled 2020-05-03: qty 10

## 2020-05-03 MED ORDER — POTASSIUM CHLORIDE 10 MEQ/100ML IV SOLN
10.0000 meq | INTRAVENOUS | Status: AC
  Administered 2020-05-03 (×2): 10 meq via INTRAVENOUS

## 2020-05-03 MED ORDER — SODIUM CHLORIDE 0.9% FLUSH
10.0000 mL | INTRAVENOUS | Status: DC | PRN
  Administered 2020-05-03: 10 mL
  Filled 2020-05-03: qty 10

## 2020-05-03 MED ORDER — SODIUM CHLORIDE 0.9 % IV SOLN
150.0000 mg | Freq: Once | INTRAVENOUS | Status: AC
  Administered 2020-05-03: 150 mg via INTRAVENOUS
  Filled 2020-05-03: qty 150

## 2020-05-03 MED ORDER — POTASSIUM CHLORIDE 10 MEQ/100ML IV SOLN
INTRAVENOUS | Status: AC
  Filled 2020-05-03: qty 200

## 2020-05-03 MED ORDER — PALONOSETRON HCL INJECTION 0.25 MG/5ML
0.2500 mg | Freq: Once | INTRAVENOUS | Status: AC
  Administered 2020-05-03: 0.25 mg via INTRAVENOUS

## 2020-05-03 MED ORDER — HEPARIN SOD (PORK) LOCK FLUSH 100 UNIT/ML IV SOLN
500.0000 [IU] | Freq: Once | INTRAVENOUS | Status: AC | PRN
  Administered 2020-05-03: 500 [IU]
  Filled 2020-05-03: qty 5

## 2020-05-03 NOTE — Patient Instructions (Signed)
[facility] Discharge Instructions for Patients Receiving Chemotherapy  Today you received the following chemotherapy agents Adriamycin and Cytoxan  To help prevent nausea and vomiting after your treatment, we encourage you to take your nausea medication as directed.  If you develop nausea and vomiting that is not controlled by your nausea medication, call the clinic.   BELOW ARE SYMPTOMS THAT SHOULD BE REPORTED IMMEDIATELY:  *FEVER GREATER THAN 100.5 F  *CHILLS WITH OR WITHOUT FEVER  NAUSEA AND VOMITING THAT IS NOT CONTROLLED WITH YOUR NAUSEA MEDICATION  *UNUSUAL SHORTNESS OF BREATH  *UNUSUAL BRUISING OR BLEEDING  TENDERNESS IN MOUTH AND THROAT WITH OR WITHOUT PRESENCE OF ULCERS  *URINARY PROBLEMS  *BOWEL PROBLEMS  UNUSUAL RASH Items with * indicate a potential emergency and should be followed up as soon as possible.  Feel free to call the clinic should you have any questions or concerns. The clinic phone number is [phone].  Please show the CHEMO ALERT CARD at check-in to the Emergency Department and triage nurse.   

## 2020-05-03 NOTE — Telephone Encounter (Signed)
Scheduled per los. Gave avs and calendar  

## 2020-05-03 NOTE — Progress Notes (Signed)
Per Dr. Burr Medico ok to treat with K of 3.0

## 2020-05-04 ENCOUNTER — Inpatient Hospital Stay: Payer: BC Managed Care – PPO

## 2020-05-04 ENCOUNTER — Other Ambulatory Visit: Payer: Self-pay | Admitting: Medical Oncology

## 2020-05-04 VITALS — BP 117/63 | HR 95 | Temp 98.1°F | Resp 16

## 2020-05-04 DIAGNOSIS — Z170 Estrogen receptor positive status [ER+]: Secondary | ICD-10-CM

## 2020-05-04 DIAGNOSIS — Z5111 Encounter for antineoplastic chemotherapy: Secondary | ICD-10-CM | POA: Diagnosis not present

## 2020-05-04 MED ORDER — PEGFILGRASTIM-JMDB 6 MG/0.6ML ~~LOC~~ SOSY
PREFILLED_SYRINGE | SUBCUTANEOUS | Status: AC
  Filled 2020-05-04: qty 0.6

## 2020-05-04 MED ORDER — PEGFILGRASTIM-JMDB 6 MG/0.6ML ~~LOC~~ SOSY
6.0000 mg | PREFILLED_SYRINGE | Freq: Once | SUBCUTANEOUS | Status: AC
  Administered 2020-05-04: 6 mg via SUBCUTANEOUS

## 2020-05-04 NOTE — Patient Instructions (Signed)

## 2020-05-09 ENCOUNTER — Encounter: Payer: Self-pay | Admitting: *Deleted

## 2020-05-15 ENCOUNTER — Telehealth: Payer: Self-pay

## 2020-05-15 NOTE — Progress Notes (Signed)
[place]   Telephone:([phone]) [phone] [phone]) [phone]   Clinic Follow up Note   Patient Care Team: [name], MD as PCP - General (Infectious Diseases) [name], MD as PCP - Infectious Diseases (Infectious Diseases) [name], MD as Consulting Physician (General Surgery) [name], MD as Consulting Physician (Hematology) [name], MD as Consulting Physician (Radiation Oncology) [name], RN as Oncology Nurse Navigator [name], RN as Oncology Nurse Navigator  Date of Service:  05/17/2020  CHIEF COMPLAINT: F/u of left breast cancer  SUMMARY OF ONCOLOGIC HISTORY: Oncology History  Malignant neoplasm of upper-outer quadrant of left breast in female, estrogen receptor positive ([place])  02/28/2020 Mammogram   Diagnostic Mammogram 02/28/20  IMPRESSION The 3.9 cm ill defined mass in the left breast posterior depth central 4.3cm to the nipple seen on the mediolateral oblique view onlt with associated difssue skin thickening is highly suspicious for malignancy.    03/01/2020 Cancer Staging   Staging form: Breast, AJCC 8th Edition - Clinical stage from 03/01/2020: Stage IV (cT4, cN0, pM1, G3, ER+, PR-, HER2-) - Signed by [name], MD on 03/06/2020   03/01/2020 Initial Biopsy   Diagnosis 03/01/20  1. Breast, left, needle core biopsy, 9 o'clock, 3-4cmfn - INVASIVE DUCTAL CARCINOMA. SEE NOTE 2. Lymph node, needle/core biopsy, right axilla - INVASIVE DUCTAL CARCINOMA. SEE NOTE Diagnosis Note 1. Invasive carcinoma measures 1.5 cm in greatest linear dimension and appears grade 3. Dr. [name] reviewed the case and concurs with the diagnosis. A breast prognostic profile (ER, PR, Ki-67 and HER2) is pending and will be reported in an addendum. Dr. [name] was notified on 03/04/2020. 2. Carcinoma measures 0.6 cm in greatest linear dimension and appears grade 3. Lymphoid tissue is not identified - this may represent a completely replaced lymph node. A  breast prognostic profile (ER, PR, Ki-67 and HER2) is pending and will be reported in an addendum. Dr. [name] reviewed the case and concurs with the diagnosis. Dr. [name] was notified on 03/04/2020.   03/01/2020 Receptors her2   1. PROGNOSTIC INDICATORS Results: IMMUNOHISTOCHEMICAL AND MORPHOMETRIC ANALYSIS PERFORMED MANUALLY The tumor cells are NEGATIVE for Her2 (0). Estrogen Receptor: 20%, POSITIVE, WEAK STAINING INTENSITY Progesterone Receptor: 0%, NEGATIVE Proliferation Marker Ki67: 40% COMMENT: The negative hormone receptor study(ies) in this case has an internal positive control.    2. PROGNOSTIC INDICATORS Results: IMMUNOHISTOCHEMICAL AND MORPHOMETRIC ANALYSIS PERFORMED MANUALLY The tumor cells are NEGATIVE for Her2 (1+). Estrogen Receptor: 10%, POSITIVE, WEAK STAINING INTENSITY Progesterone Receptor: 0%, NEGATIVE Proliferation Marker Ki67: 40% COMMENT: The negative hormone receptor study(ies) in this case has no internal positive control.   03/05/2020 Initial Diagnosis   Malignant neoplasm of upper-outer quadrant of left breast in female, estrogen receptor positive ([place])   03/18/2020 Breast MRI   IMPRESSION: 1. 7 x 9 x 6 cm area of suspicious non masslike enhancement within the central/UPPER RIGHT breast with anterior skin thickening. Given biopsy-proven metastatic RIGHT axillary lymph node, tissue sampling of the anterior and posterior aspects of this non masslike RIGHT breast enhancement is recommended to exclude malignancy. 2. 7 x 6 x 5 cm diffuse masslike and nonmasslike enhancement within the majority of the remaining LEFT breast with diffuse LEFT breast skin thickening, compatible with malignancy. Abnormal enhancement is noted along the anterior aspect of the LEFT pectoralis muscle. 3. Three abnormal RIGHT axillary lymph nodes, 1 which is biopsy proven to be metastatic disease. No abnormal LEFT axillary lymph nodes identified.   03/20/2020 Echocardiogram    Baseline  ECHO  IMPRESSIONS     1. The patient was reported to be in pain during the study and not able  to participate in this study. All that can be said is that the LV and RV  function are grossly normal. Would recommend to repeat this study when/if  the patient is able to tolerate  the exam.   2. Left ventricular ejection fraction, by estimation, is 60 to 65%. The  left ventricle has normal function. Left ventricular endocardial border  not optimally defined to evaluate regional wall motion. Left ventricular  diastolic function could not be  evaluated.   3. Right ventricular systolic function is normal. The right ventricular  size is normal.   4. The mitral valve was not well visualized. No evidence of mitral valve  regurgitation.   5. The aortic valve was not well visualized. Aortic valve regurgitation  is not visualized.   6. The inferior vena cava is normal in size with greater than 50%  respiratory variability, suggesting right atrial pressure of 3 mmHg.    03/20/2020 Imaging   CT CAP W contrast  IMPRESSION: 1. Small right axillary/subpectoral lymph nodes. Difficult to exclude metastatic disease. 2. Subtle 6 mm low-attenuation lesion in the dome of the liver, not well seen previously. Lesion is too small to characterize. Further evaluation could be performed with MR abdomen without and with contrast, as clinically indicated. 3. Hepatic steatosis. 4. Aortic atherosclerosis (ICD10-I70.0). Coronary artery calcification.     03/20/2020 Imaging   Whole body bone scan  IMPRESSION: 1. Increased activity noted over the right breast, possibly related to the patient's right breast cancer.   2. Punctate area of increased activity over the left maxillary region, most likely related to sinus disease or dental disease. Increased activity noted over the left mandible also possibly related to dental disease. No other focal bony abnormalities to suggest metastatic disease  identified.   03/22/2020 Procedure   PAC placement by Dr [name]    03/22/2020 -  Chemotherapy   AC q2weeks for 4 cycles 03/22/20-05/03/20 followed by weekly Carboplatin and Taxol for 12 weeks starting 05/17/20   03/22/2020 Pathology Results   FINAL MICROSCOPIC DIAGNOSIS:   A. BREAST, RIGHT, PUNCH BIOPSY:  - Carcinoma involving dermal lymphatics.  - See comment.   B. BREAST, LEFT, PUNCH BIOPSY:  - Carcinoma involving dermal lymphatics.  - See comment.   COMMENT:  The morphology is compatible with ductal breast carcinoma.    ADDENDUM:   PROGNOSTIC INDICATOR RESULTS:   Immunohistochemical and morphometric analysis performed manually   The tumor cells are NEGATIVE for Her2 (0%).   Estrogen Receptor:       NEGATIVE, 0%  Progesterone Receptor:   NEGATIVE, 0%       CURRENT THERAPY:  AC q2weeks for 4 cycles 03/22/20-05/03/20 followed by weekly Carboplatin and Taxol for 12 weeks starting 05/17/20.   INTERVAL HISTORY:  Victoria Coffey is here for a follow up and treatment. She presents to the clinic alone. She notes her last cycle chemo went well. She notes she has felt mostly just tired which lasted 2 days. She was able to recover adequately. She notes she was fine with losing weight by reducing fatty foods in her diet and increasing more vegetables. She also notes with lower appetite she has been taking more smoothies. She notes she is lactose intolerant. She notes some types of food will get stuck in her throat but she thinks it is mental. She has been able to swallow  softer foods before. She did not have this issue before starting chemo and denies tightness and pain.  She feels her left breast continues to soften.     REVIEW OF SYSTEMS:   Constitutional: Denies fevers, chills (+) Purposeful weight loss, low appetite   Eyes: Denies blurriness of vision Ears, nose, mouth, throat, and face: Denies mucositis or sore throat Respiratory: Denies cough, dyspnea or wheezes Cardiovascular:  Denies palpitation, chest discomfort or lower extremity swelling Gastrointestinal:  Denies nausea, heartburn or change in bowel habits Skin: Denies abnormal skin rashes Lymphatics: Denies new lymphadenopathy or easy bruising Neurological:Denies numbness, tingling or new weaknesses Behavioral/Psych: Mood is stable, no new changes  All other systems were reviewed with the patient and are negative.  MEDICAL HISTORY:  Past Medical History:  Diagnosis Date  . Breast cancer ([place]) dx'd 1993   left  . History of left breast cancer   . HIV infection ([place])   . Hypertension     SURGICAL HISTORY: Past Surgical History:  Procedure Laterality Date  . BREAST BIOPSY Bilateral 03/22/2020   Procedure: BILATERAL BREAST PUNCH BIOPSIES;  Surgeon: [name], MD;  Location: [facility];  Service: General;  Laterality: Bilateral;  . BREAST LUMPECTOMY    . CESAREAN SECTION    . left breast lumpectomy  1993  . PORTACATH PLACEMENT Right 03/22/2020   Procedure: INSERTION PORT-A-CATH WITH ULTRASOUND GUIDANCE;  Surgeon: [name], MD;  Location: [facility];  Service: General;  Laterality: Right;    I have reviewed the social history and family history with the patient and they are unchanged from previous note.  ALLERGIES:  is allergic to codeine, other, grapefruit bioflavonoid complex, pomegranate [punica], and shellfish allergy.  MEDICATIONS:  Current Outpatient Medications  Medication Sig Dispense Refill  . acetaminophen (TYLENOL) 500 MG tablet Take 1,000 mg by mouth every 6 (six) hours as needed for moderate pain.    Marland Kitchen emtricitabine-rilpivir-tenofovir AF (ODEFSEY) 200-25-25 MG TABS tablet Take 1 tablet by mouth daily with breakfast. 30 tablet 11  . hydrochlorothiazide (HYDRODIURIL) 25 MG tablet Take 1 tablet (25 mg total) by mouth daily. 30 tablet 11  . lidocaine-prilocaine (EMLA) cream Apply to affected area once 30 g 3  . ondansetron (ZOFRAN) 8 MG tablet Take 1 tablet (8 mg total) by mouth 2 (two) times  daily as needed. Start on the third day after chemotherapy. 30 tablet 2  . oxyCODONE (OXY IR/ROXICODONE) 5 MG immediate release tablet Take 1 tablet (5 mg total) by mouth every 6 (six) hours as needed for severe pain. 15 tablet 0  . potassium chloride SA (KLOR-CON) 20 MEQ tablet Take 1 tablet (20 mEq total) by mouth 2 (two) times daily. Take 1 tab twice daily for 3 days then 1 tab once daily 60 tablet 1  . prochlorperazine (COMPAZINE) 10 MG tablet Take 1 tablet (10 mg total) by mouth every 6 (six) hours as needed (Nausea or vomiting). 30 tablet 2   Current Facility-Administered Medications  Medication Dose Route Frequency Provider Last Rate Last Admin  . 0.9 %  sodium chloride infusion   Intravenous Continuous [name], MD 500 mL/hr at 05/17/20 0950 New Bag at 05/17/20 0950   Facility-Administered Medications Ordered in Other Visits  Medication Dose Route Frequency Provider Last Rate Last Admin  . CARBOplatin (PARAPLATIN) 380 mg in sodium chloride 0.9 % 100 mL chemo infusion  380 mg Intravenous Once [name], MD      . dexamethasone (DECADRON) 10 mg in sodium chloride 0.9 % 50 mL  IVPB  10 mg Intravenous Once [name], MD      . famotidine (PEPCID) IVPB 20 mg premix  20 mg Intravenous Once [name], MD 200 mL/hr at 05/17/20 1002 20 mg at 05/17/20 1002  . fosaprepitant (EMEND) 150 mg in sodium chloride 0.9 % 145 mL IVPB  150 mg Intravenous Once [name], MD      . heparin lock flush 100 unit/mL  500 Units Intracatheter Once PRN [name], MD      . PACLitaxel (TAXOL) 132 mg in sodium chloride 0.9 % 250 mL chemo infusion (</= 2m/m2)  132 mg Intravenous Once FTruitt Merle MD      . sodium chloride flush (NS) 0.9 % injection 10 mL  10 mL Intracatheter PRN FTruitt Merle MD        PHYSICAL EXAMINATION: ECOG PERFORMANCE STATUS: 1 - Symptomatic but completely ambulatory  Vitals:   05/17/20 0837  BP: 117/70  Pulse: 99  Resp: 17  Temp: (!) 97.5 F (36.4 C)  SpO2: 100%   Filed Weights    05/17/20 0837  Weight: 142 lb 9.6 oz (64.7 kg)    GENERAL:alert, no distress and comfortable SKIN: skin color, texture, turgor are normal, no rashes or significant lesions EYES: normal, Conjunctiva are pink and non-injected, sclera clear  NECK: supple, thyroid normal size, non-tender, without nodularity LYMPH:  no palpable lymphadenopathy in the cervical, axillary  LUNGS: clear to auscultation and percussion with normal breathing effort HEART: regular rate & rhythm and no murmurs and no lower extremity edema ABDOMEN:abdomen soft, non-tender and normal bowel sounds Musculoskeletal:no cyanosis of digits and no clubbing  NEURO: alert & oriented x 3 with fluent speech, no focal motor/sensory deficits BREAST:(+) Skin thickening measuring now 10x7cm (previously 12x9cm) of her left breast with hyperpigmentation, mostly stable without skin breakdown (+) Right breast palpable mass now 4.5x3cm (previously 5.5x5cm) in RUQ with skin erythema. (+) 2cm palpable right axillary LN, now softer (+) Right breast larger than left    LABORATORY DATA:  I have reviewed the data as listed CBC Latest Ref Rng & Units 05/17/2020 05/03/2020 04/19/2020  WBC 4.0 - 10.5 K/uL 5.5 8.5 5.7  Hemoglobin 12.0 - 15.0 g/dL 8.0(L) 9.7(L) 10.4(L)  Hematocrit 36 - 46 % 23.7(L) 28.8(L) 31.3(L)  Platelets 150 - 400 K/uL 185 325 295     CMP Latest Ref Rng & Units 05/17/2020 05/03/2020 04/19/2020  Glucose 70 - 99 mg/dL 112(H) 110(H) 102(H)  BUN 8 - 23 mg/dL '11 13 12  ' Creatinine 0.44 - 1.00 mg/dL 1.17(H) 0.93 0.79  Sodium 135 - 145 mmol/L 136 140 136  Potassium 3.5 - 5.1 mmol/L 3.3(L) 3.0(LL) 3.5  Chloride 98 - 111 mmol/L 101 104 104  CO2 22 - 32 mmol/L '25 26 23  ' Calcium 8.9 - 10.3 mg/dL 8.8(L) 9.0 9.1  Total Protein 6.5 - 8.1 g/dL 7.0 6.9 7.1  Total Bilirubin 0.3 - 1.2 mg/dL 0.3 <0.2(L) <0.2(L)  Alkaline Phos 38 - 126 U/L 148(H) 174(H) 253(H)  AST 15 - 41 U/L 18 24 37  ALT 0 - 44 U/L 15 26 49(H)      RADIOGRAPHIC  STUDIES: I have personally reviewed the radiological images as listed and agreed with the findings in the report. No results found.   ASSESSMENT & PLAN:  NKHAMYA TOPPis a 64y.o. female with     1.Left breast cancer,cT4N0M1, possible stageIVwithinflammatory to skin of B/l breast (triple negative) andright breat cancer in [facility] withaxillary node metastasis, ERweakly+,PR/HER2-, GAzucena Kuba-She  was recently diagnosed with left breast cancer in 02/2020.Her Mammogram showed an at least 4cm mass of left breast. Based on initial exam sheprobably hasskin involvementand her entire left breast is occupied by cancer. Her mammogram also showed right LN enlargement. 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 It is not definitive if her right LN are related to her left breast cancer, or if she hasasecond right breast cancer -Her breast MRIfrom 5/17/21showedleft known breast cancer, she also has a large non-mass-like enhancement in the central upper right breast, highly suspicious for malignancy. Dr. [name] did punch skin biopsy of both right and left breaston 03/22/20, whichallcame back positive for carcinomawith ER/PR/HER2 negative markers.We discussed the option of right breast biopsy,versus right mastectomy. Patient would like to proceed with bilateral mastectomy.No need for right breast mass biopsy in this case. -staging scans were negative for distant metastasis  -Istarted her on neoadjuvantchemotherapy as first-line treatmentwithAdriamycin and Cytoxan. which she completed 4 cycles5/21/21-05/03/20. Plan to proceed with weekly Taxol and carboplatin every 3 weeks for 12 weeksstarting 05/17/20. Goal of therapy is to downstage her cancer andhopefully she will become a candidate for surgical resection. -she is tolerating chemo moderately well overall, right breast mass has decreased in size on exam, no significant change of left breast  cancer  -Labs reviewed, Hg 8, K 3.3, BG 112, Cr 1.17, Ca 8.8, albumin 3.2, Alk Phos 148. Overall adequate to proceed with start of TC today. Will monitor her blood counts closely.  -Her breast exam today indicates slow, but good response to [facility]. If she is not clinically responding to [facility] will repeat MRI sooner.  -F/u next week   2. Anemia, secondary to chemo  -Will give blood transfusion for Hg <7.  -She is currently on prenatal vitamin.  -Her Hg is 8 today (05/03/20). She may need a blood transfusion in the next few week. I encouraged her to watch for fatigue.   3. Weight Loss, Low appetite  -Her weight has been trending down. She notes she has been fine with lose weight. She has lost 10 pounds in the past month. She has increased vegetables, smoothies in diet and less fatty foods less solid food overall.  -I discussed focusing on maintaining weight instead of losing weight while on chemo so she is strong enough to continue.  -I encouraged her to add starches and increase protein in diet to keep up daily calories.  -She will continue to f/u with Dietician.   4. H/o Left breast cancer, Dx in 1993, Genetic Testing has not been done yet.   5. Comorbidities: HIV(+) Dx 1993, HTN -Her HIV has been undetectable and well controlled. She is currently being seen by ID Dr Graylon Good   6.Mild transaminitis  -Unclear etiology, presents before chemotherapy -Her LFTs have mildly progressed further. Her 04/17/20 MRI abdomenwas benign. -Her LFTs has normalized except mildly elevated Alk Phos (05/17/20)  7. Elevated Cr  -Her Cr is elevated today at 1.17 (05/17/20). She notes she drinks about 48 ounces of water a day.  -I will give IV Fluids today, she is agreeable.   8. Hypokalemia  -Secondary to chemo  -She has been on potassium once daily.  -K at 3.3 today, I encouraged her to increase to BID.    PLAN: -I refilled her Zofran today  -IV fluids today 591m over 1 hr  -Labs reviewed and  adequate to proceed with week 1 TC today, carbo AUC 5  -Lab, flush, F/u  with me or NP Cassie and chemo TC in 1, 2, 3 weeks    No problem-specific Assessment & Plan notes found for this encounter.   No orders of the defined types were placed in this encounter.  All questions were answered. The patient knows to call the clinic with any problems, questions or concerns. No barriers to learning was detected. The total time spent in the appointment was 30 minutes.     [name], MD 05/17/2020   I, Joslyn Devon, am acting as scribe for [name], MD.   I have reviewed the above documentation for accuracy and completeness, and I agree with the above.

## 2020-05-15 NOTE — Telephone Encounter (Signed)
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  Patient currently receiving carboplatin and taxol.  Past medical history of HTN and HIV.    Spoke with patient via phone.  Patient reports decreased appetite and ability to eat solid/full meals.  Reports that smoothies are working better for her right now.  Has been making kale, spinach and fruit smoothies with coconut water and protein powder.  Has been eating cucumber and tomatoes and other vegetables.  She has no appetite for meats. Also reports taste alterations. Reports that over the last 2 weeks nausea has improved.  Has been snacking on crackers and peanut butter as well. Likes nuts.  Dairy foods upset her stomach.      Medications: reviewed   Labs: K 3.0   Anthropometrics:   Height: 61 inches Weight: 151 lb UBW: 161 lb in 6/4 1/4 167 lb BMI: 28  6% weight loss in the last month, signficant  Estimated Energy Needs  Kcals: 1700-2000 Protein: 85-100 g Fluid: > 1.7 L   NUTRITION DIAGNOSIS: Inadequate oral intake related to cancer related treatment side effects as evidenced by    INTERVENTION:  Encouraged small frequent meals/snacks Reviewed protein powders and discussed alternative protein sources other than meats.  Will provide recipes for savory soups/smoothies that patient can try.  Discussed ways to add calories and protein into current eating pattern.   Contact information provided  MONITORING, EVALUATION, GOAL: weight trends, intake   Next Visit: Thursday, August 12th in infusion  Yamel Bale B. Zenia Resides, [place], [place] Registered Dietitian [phone] (pager)

## 2020-05-17 ENCOUNTER — Other Ambulatory Visit: Payer: Self-pay | Admitting: Hematology

## 2020-05-17 ENCOUNTER — Inpatient Hospital Stay: Payer: BC Managed Care – PPO

## 2020-05-17 ENCOUNTER — Inpatient Hospital Stay (HOSPITAL_BASED_OUTPATIENT_CLINIC_OR_DEPARTMENT_OTHER): Payer: BC Managed Care – PPO | Admitting: Hematology

## 2020-05-17 ENCOUNTER — Other Ambulatory Visit: Payer: Self-pay

## 2020-05-17 ENCOUNTER — Encounter: Payer: Self-pay | Admitting: Hematology

## 2020-05-17 ENCOUNTER — Encounter: Payer: Self-pay | Admitting: *Deleted

## 2020-05-17 VITALS — BP 117/70 | HR 99 | Temp 97.5°F | Resp 17 | Ht 61.0 in | Wt 142.6 lb

## 2020-05-17 VITALS — BP 116/72 | HR 89 | Temp 98.4°F | Resp 18

## 2020-05-17 DIAGNOSIS — Z170 Estrogen receptor positive status [ER+]: Secondary | ICD-10-CM

## 2020-05-17 DIAGNOSIS — Z5111 Encounter for antineoplastic chemotherapy: Secondary | ICD-10-CM | POA: Diagnosis not present

## 2020-05-17 DIAGNOSIS — Z95828 Presence of other vascular implants and grafts: Secondary | ICD-10-CM

## 2020-05-17 DIAGNOSIS — C50412 Malignant neoplasm of upper-outer quadrant of left female breast: Secondary | ICD-10-CM

## 2020-05-17 LAB — CBC WITH DIFFERENTIAL (CANCER CENTER ONLY)
Abs Immature Granulocytes: 0.04 10*3/uL (ref 0.00–0.07)
Basophils Absolute: 0 10*3/uL (ref 0.0–0.1)
Basophils Relative: 0 %
Eosinophils Absolute: 0 10*3/uL (ref 0.0–0.5)
Eosinophils Relative: 0 %
HCT: 23.7 % — ABNORMAL LOW (ref 36.0–46.0)
Hemoglobin: 8 g/dL — ABNORMAL LOW (ref 12.0–15.0)
Immature Granulocytes: 1 %
Lymphocytes Relative: 11 %
Lymphs Abs: 0.6 10*3/uL — ABNORMAL LOW (ref 0.7–4.0)
MCH: 29 pg (ref 26.0–34.0)
MCHC: 33.8 g/dL (ref 30.0–36.0)
MCV: 85.9 fL (ref 80.0–100.0)
Monocytes Absolute: 0.9 10*3/uL (ref 0.1–1.0)
Monocytes Relative: 16 %
Neutro Abs: 4 10*3/uL (ref 1.7–7.7)
Neutrophils Relative %: 72 %
Platelet Count: 185 10*3/uL (ref 150–400)
RBC: 2.76 MIL/uL — ABNORMAL LOW (ref 3.87–5.11)
RDW: 17.2 % — ABNORMAL HIGH (ref 11.5–15.5)
WBC Count: 5.5 10*3/uL (ref 4.0–10.5)
nRBC: 0 % (ref 0.0–0.2)

## 2020-05-17 LAB — CMP (CANCER CENTER ONLY)
ALT: 15 U/L (ref 0–44)
AST: 18 U/L (ref 15–41)
Albumin: 3.2 g/dL — ABNORMAL LOW (ref 3.5–5.0)
Alkaline Phosphatase: 148 U/L — ABNORMAL HIGH (ref 38–126)
Anion gap: 10 (ref 5–15)
BUN: 11 mg/dL (ref 8–23)
CO2: 25 mmol/L (ref 22–32)
Calcium: 8.8 mg/dL — ABNORMAL LOW (ref 8.9–10.3)
Chloride: 101 mmol/L (ref 98–111)
Creatinine: 1.17 mg/dL — ABNORMAL HIGH (ref 0.44–1.00)
GFR, Est AFR Am: 57 mL/min — ABNORMAL LOW (ref >60)
GFR, Estimated: 50 mL/min — ABNORMAL LOW (ref >60)
Glucose, Bld: 112 mg/dL — ABNORMAL HIGH (ref 70–99)
Potassium: 3.3 mmol/L — ABNORMAL LOW (ref 3.5–5.1)
Sodium: 136 mmol/L (ref 135–145)
Total Bilirubin: 0.3 mg/dL (ref 0.3–1.2)
Total Protein: 7 g/dL (ref 6.5–8.1)

## 2020-05-17 MED ORDER — SODIUM CHLORIDE 0.9 % IV SOLN
133.0000 mg | Freq: Once | INTRAVENOUS | Status: AC
  Administered 2020-05-17: 132 mg via INTRAVENOUS
  Filled 2020-05-17: qty 22

## 2020-05-17 MED ORDER — SODIUM CHLORIDE 0.9 % IV SOLN
380.0000 mg | Freq: Once | INTRAVENOUS | Status: AC
  Administered 2020-05-17: 380 mg via INTRAVENOUS
  Filled 2020-05-17: qty 38

## 2020-05-17 MED ORDER — SODIUM CHLORIDE 0.9 % IV SOLN
INTRAVENOUS | Status: DC
  Filled 2020-05-17 (×2): qty 250

## 2020-05-17 MED ORDER — FAMOTIDINE IN NACL 20-0.9 MG/50ML-% IV SOLN
20.0000 mg | Freq: Once | INTRAVENOUS | Status: AC
  Administered 2020-05-17: 20 mg via INTRAVENOUS

## 2020-05-17 MED ORDER — DIPHENHYDRAMINE HCL 50 MG/ML IJ SOLN
INTRAMUSCULAR | Status: AC
  Filled 2020-05-17: qty 1

## 2020-05-17 MED ORDER — PALONOSETRON HCL INJECTION 0.25 MG/5ML
0.2500 mg | Freq: Once | INTRAVENOUS | Status: AC
  Administered 2020-05-17: 0.25 mg via INTRAVENOUS

## 2020-05-17 MED ORDER — SODIUM CHLORIDE 0.9% FLUSH
10.0000 mL | INTRAVENOUS | Status: DC | PRN
  Administered 2020-05-17: 10 mL
  Filled 2020-05-17: qty 10

## 2020-05-17 MED ORDER — PROCHLORPERAZINE MALEATE 10 MG PO TABS: 10.0000 mg | ORAL_TABLET | Freq: Four times a day (QID) | ORAL | 2 refills | Status: DC | PRN

## 2020-05-17 MED ORDER — ONDANSETRON HCL 8 MG PO TABS: 8.0000 mg | ORAL_TABLET | Freq: Two times a day (BID) | ORAL | 2 refills | Status: DC | PRN

## 2020-05-17 MED ORDER — SODIUM CHLORIDE 0.9 % IV SOLN
10.0000 mg | Freq: Once | INTRAVENOUS | Status: AC
  Administered 2020-05-17: 10 mg via INTRAVENOUS
  Filled 2020-05-17: qty 10

## 2020-05-17 MED ORDER — SODIUM CHLORIDE 0.9 % IV SOLN
Freq: Once | INTRAVENOUS | Status: AC
  Filled 2020-05-17: qty 250

## 2020-05-17 MED ORDER — SODIUM CHLORIDE 0.9 % IV SOLN
150.0000 mg | Freq: Once | INTRAVENOUS | Status: AC
  Administered 2020-05-17: 150 mg via INTRAVENOUS
  Filled 2020-05-17: qty 150

## 2020-05-17 MED ORDER — HEPARIN SOD (PORK) LOCK FLUSH 100 UNIT/ML IV SOLN
500.0000 [IU] | Freq: Once | INTRAVENOUS | Status: AC | PRN
  Administered 2020-05-17: 500 [IU]
  Filled 2020-05-17: qty 5

## 2020-05-17 MED ORDER — DIPHENHYDRAMINE HCL 50 MG/ML IJ SOLN
50.0000 mg | Freq: Once | INTRAMUSCULAR | Status: AC
  Administered 2020-05-17: 50 mg via INTRAVENOUS

## 2020-05-17 MED ORDER — PALONOSETRON HCL INJECTION 0.25 MG/5ML
INTRAVENOUS | Status: AC
  Filled 2020-05-17: qty 5

## 2020-05-17 MED ORDER — FAMOTIDINE IN NACL 20-0.9 MG/50ML-% IV SOLN
INTRAVENOUS | Status: AC
  Filled 2020-05-17: qty 50

## 2020-05-17 MED ORDER — SODIUM CHLORIDE 0.9% FLUSH
10.0000 mL | Freq: Once | INTRAVENOUS | Status: AC
  Administered 2020-05-17: 10 mL
  Filled 2020-05-17: qty 10

## 2020-05-17 NOTE — Patient Instructions (Signed)

## 2020-05-17 NOTE — Patient Instructions (Addendum)
[facility] Discharge Instructions for Patients Receiving Chemotherapy  Today you received the following chemotherapy agents: Taxol/Carbo.  To help prevent nausea and vomiting after your treatment, we encourage you to take your nausea medication as directed.   If you develop nausea and vomiting that is not controlled by your nausea medication, call the clinic.   BELOW ARE SYMPTOMS THAT SHOULD BE REPORTED IMMEDIATELY:  *FEVER GREATER THAN 100.5 F  *CHILLS WITH OR WITHOUT FEVER  NAUSEA AND VOMITING THAT IS NOT CONTROLLED WITH YOUR NAUSEA MEDICATION  *UNUSUAL SHORTNESS OF BREATH  *UNUSUAL BRUISING OR BLEEDING  TENDERNESS IN MOUTH AND THROAT WITH OR WITHOUT PRESENCE OF ULCERS  *URINARY PROBLEMS  *BOWEL PROBLEMS  UNUSUAL RASH Items with * indicate a potential emergency and should be followed up as soon as possible.  Feel free to call the clinic should you have any questions or concerns. The clinic phone number is [phone].  Please show the [facility] at check-in to the Emergency Department and triage nurse.  Paclitaxel injection What is this medicine? PACLITAXEL (PAK li TAX el) is a chemotherapy drug. It targets fast dividing cells, like cancer cells, and causes these cells to die. This medicine is used to treat ovarian cancer, breast cancer, lung cancer, Kaposi's sarcoma, and other cancers. This medicine may be used for other purposes; ask your health care provider or pharmacist if you have questions. COMMON BRAND NAME(S): Onxol, Taxol What should I tell my health care provider before I take this medicine? They need to know if you have any of these conditions:  history of irregular heartbeat  liver disease  low blood counts, like low white cell, platelet, or red cell counts  lung or breathing disease, like asthma  tingling of the fingers or toes, or other nerve disorder  an unusual or allergic reaction to paclitaxel, alcohol, polyoxyethylated  castor oil, other chemotherapy, other medicines, foods, dyes, or preservatives  pregnant or trying to get pregnant  breast-feeding How should I use this medicine? This drug is given as an infusion into a vein. It is administered in a hospital or clinic by a specially trained health care professional. Talk to your pediatrician regarding the use of this medicine in children. Special care may be needed. Overdosage: If you think you have taken too much of this medicine contact a poison control center or emergency room at once. NOTE: This medicine is only for you. Do not share this medicine with others. What if I miss a dose? It is important not to miss your dose. Call your doctor or health care professional if you are unable to keep an appointment. What may interact with this medicine? Do not take this medicine with any of the following medications:  disulfiram  metronidazole This medicine may also interact with the following medications:  antiviral medicines for hepatitis, HIV or AIDS  certain antibiotics like erythromycin and clarithromycin  certain medicines for fungal infections like ketoconazole and itraconazole  certain medicines for seizures like carbamazepine, phenobarbital, phenytoin  gemfibrozil  nefazodone  rifampin  St. John's wort This list may not describe all possible interactions. Give your health care provider a list of all the medicines, herbs, non-prescription drugs, or dietary supplements you use. Also tell them if you smoke, drink alcohol, or use illegal drugs. Some items may interact with your medicine. What should I watch for while using this medicine? Your condition will be monitored carefully while you are receiving this medicine. You will need important blood work  done while you are taking this medicine. This medicine can cause serious allergic reactions. To reduce your risk you will need to take other medicine(s) before treatment with this medicine. If you  experience allergic reactions like skin rash, itching or hives, swelling of the face, lips, or tongue, tell your doctor or health care professional right away. In some cases, you may be given additional medicines to help with side effects. Follow all directions for their use. This drug may make you feel generally unwell. This is not uncommon, as chemotherapy can affect healthy cells as well as cancer cells. Report any side effects. Continue your course of treatment even though you feel ill unless your doctor tells you to stop. Call your doctor or health care professional for advice if you get a fever, chills or sore throat, or other symptoms of a cold or flu. Do not treat yourself. This drug decreases your body's ability to fight infections. Try to avoid being around people who are sick. This medicine may increase your risk to bruise or bleed. Call your doctor or health care professional if you notice any unusual bleeding. Be careful brushing and flossing your teeth or using a toothpick because you may get an infection or bleed more easily. If you have any dental work done, tell your dentist you are receiving this medicine. Avoid taking products that contain aspirin, acetaminophen, ibuprofen, naproxen, or ketoprofen unless instructed by your doctor. These medicines may hide a fever. Do not become pregnant while taking this medicine. Women should inform their doctor if they wish to become pregnant or think they might be pregnant. There is a potential for serious side effects to an unborn child. Talk to your health care professional or pharmacist for more information. Do not breast-feed an infant while taking this medicine. Men are advised not to father a child while receiving this medicine. This product may contain alcohol. Ask your pharmacist or healthcare provider if this medicine contains alcohol. Be sure to tell all healthcare providers you are taking this medicine. Certain medicines, like metronidazole  and disulfiram, can cause an unpleasant reaction when taken with alcohol. The reaction includes flushing, headache, nausea, vomiting, sweating, and increased thirst. The reaction can last from 30 minutes to several hours. What side effects may I notice from receiving this medicine? Side effects that you should report to your doctor or health care professional as soon as possible:  allergic reactions like skin rash, itching or hives, swelling of the face, lips, or tongue  breathing problems  changes in vision  fast, irregular heartbeat  high or low blood pressure  mouth sores  pain, tingling, numbness in the hands or feet  signs of decreased platelets or bleeding - bruising, pinpoint red spots on the skin, black, tarry stools, blood in the urine  signs of decreased red blood cells - unusually weak or tired, feeling faint or lightheaded, falls  signs of infection - fever or chills, cough, sore throat, pain or difficulty passing urine  signs and symptoms of liver injury like dark yellow or brown urine; general ill feeling or flu-like symptoms; light-colored stools; loss of appetite; nausea; right upper belly pain; unusually weak or tired; yellowing of the eyes or skin  swelling of the ankles, feet, hands  unusually slow heartbeat Side effects that usually do not require medical attention (report to your doctor or health care professional if they continue or are bothersome):  diarrhea  hair loss  loss of appetite  muscle or joint pain    nausea, vomiting  pain, redness, or irritation at site where injected  tiredness This list may not describe all possible side effects. Call your doctor for medical advice about side effects. You may report side effects to FDA at 1-800-FDA-1088. Where should I keep my medicine? This drug is given in a hospital or clinic and will not be stored at home. NOTE: This sheet is a summary. It may not cover all possible information. If you have  questions about this medicine, talk to your doctor, pharmacist, or health care provider.  2020 Elsevier/Gold Standard (2017-06-22 13:14:55)  Carboplatin injection What is this medicine? CARBOPLATIN (KAR boe pla tin) is a chemotherapy drug. It targets fast dividing cells, like cancer cells, and causes these cells to die. This medicine is used to treat ovarian cancer and many other cancers. This medicine may be used for other purposes; ask your health care provider or pharmacist if you have questions. COMMON BRAND NAME(S): Paraplatin What should I tell my health care provider before I take this medicine? They need to know if you have any of these conditions:  blood disorders  hearing problems  kidney disease  recent or ongoing radiation therapy  an unusual or allergic reaction to carboplatin, cisplatin, other chemotherapy, other medicines, foods, dyes, or preservatives  pregnant or trying to get pregnant  breast-feeding How should I use this medicine? This drug is usually given as an infusion into a vein. It is administered in a hospital or clinic by a specially trained health care professional. Talk to your pediatrician regarding the use of this medicine in children. Special care may be needed. Overdosage: If you think you have taken too much of this medicine contact a poison control center or emergency room at once. NOTE: This medicine is only for you. Do not share this medicine with others. What if I miss a dose? It is important not to miss a dose. Call your doctor or health care professional if you are unable to keep an appointment. What may interact with this medicine?  medicines for seizures  medicines to increase blood counts like filgrastim, pegfilgrastim, sargramostim  some antibiotics like amikacin, gentamicin, neomycin, streptomycin, tobramycin  vaccines Talk to your doctor or health care professional before taking any of these  medicines:  acetaminophen  aspirin  ibuprofen  ketoprofen  naproxen This list may not describe all possible interactions. Give your health care provider a list of all the medicines, herbs, non-prescription drugs, or dietary supplements you use. Also tell them if you smoke, drink alcohol, or use illegal drugs. Some items may interact with your medicine. What should I watch for while using this medicine? Your condition will be monitored carefully while you are receiving this medicine. You will need important blood work done while you are taking this medicine. This drug may make you feel generally unwell. This is not uncommon, as chemotherapy can affect healthy cells as well as cancer cells. Report any side effects. Continue your course of treatment even though you feel ill unless your doctor tells you to stop. In some cases, you may be given additional medicines to help with side effects. Follow all directions for their use. Call your doctor or health care professional for advice if you get a fever, chills or sore throat, or other symptoms of a cold or flu. Do not treat yourself. This drug decreases your body's ability to fight infections. Try to avoid being around people who are sick. This medicine may increase your risk to bruise or bleed.  Call your doctor or health care professional if you notice any unusual bleeding. Be careful brushing and flossing your teeth or using a toothpick because you may get an infection or bleed more easily. If you have any dental work done, tell your dentist you are receiving this medicine. Avoid taking products that contain aspirin, acetaminophen, ibuprofen, naproxen, or ketoprofen unless instructed by your doctor. These medicines may hide a fever. Do not become pregnant while taking this medicine. Women should inform their doctor if they wish to become pregnant or think they might be pregnant. There is a potential for serious side effects to an unborn child. Talk  to your health care professional or pharmacist for more information. Do not breast-feed an infant while taking this medicine. What side effects may I notice from receiving this medicine? Side effects that you should report to your doctor or health care professional as soon as possible:  allergic reactions like skin rash, itching or hives, swelling of the face, lips, or tongue  signs of infection - fever or chills, cough, sore throat, pain or difficulty passing urine  signs of decreased platelets or bleeding - bruising, pinpoint red spots on the skin, black, tarry stools, nosebleeds  signs of decreased red blood cells - unusually weak or tired, fainting spells, lightheadedness  breathing problems  changes in hearing  changes in vision  chest pain  high blood pressure  low blood counts - This drug may decrease the number of white blood cells, red blood cells and platelets. You may be at increased risk for infections and bleeding.  nausea and vomiting  pain, swelling, redness or irritation at the injection site  pain, tingling, numbness in the hands or feet  problems with balance, talking, walking  trouble passing urine or change in the amount of urine Side effects that usually do not require medical attention (report to your doctor or health care professional if they continue or are bothersome):  hair loss  loss of appetite  metallic taste in the mouth or changes in taste This list may not describe all possible side effects. Call your doctor for medical advice about side effects. You may report side effects to FDA at 1-800-FDA-1088. Where should I keep my medicine? This drug is given in a hospital or clinic and will not be stored at home. NOTE: This sheet is a summary. It may not cover all possible information. If you have questions about this medicine, talk to your doctor, pharmacist, or health care provider.  2020 [employer]vier/Gold Standard (2008-01-24 14:38:05)  

## 2020-05-18 LAB — CANCER ANTIGEN 27.29: CA 27.29: 215.2 U/mL — ABNORMAL HIGH (ref 0.0–38.6)

## 2020-05-20 ENCOUNTER — Telehealth: Payer: Self-pay | Admitting: Hematology

## 2020-05-20 ENCOUNTER — Telehealth: Payer: Self-pay

## 2020-05-20 NOTE — Telephone Encounter (Signed)
No 7/16 los

## 2020-05-20 NOTE — Telephone Encounter (Signed)
I spoke with Ms Lisbon, She denied any s/s of chemotherapy over the weekend.  I reminded her to let us know if she develops numbness/tingleing in hands and/or feet.  She verbalized understanding.

## 2020-05-20 NOTE — Progress Notes (Signed)
[place]   [phone] [phone]   Clinic Follow up Note   Patient Care Team: [name], MD as PCP - General (Infectious Diseases) [name], MD as PCP - Infectious Diseases (Infectious Diseases) [name], MD as Consulting Physician (General Surgery) [name], MD as Consulting Physician (Hematology) [name], MD as Consulting Physician (Radiation Oncology) [name], RN as Oncology Nurse Navigator [name], RN as Oncology Nurse Navigator  Date of Service:  05/24/2020  CHIEF COMPLAINT: F/u of left breast cancer  SUMMARY OF ONCOLOGIC HISTORY: Oncology History Overview Note  Cancer Staging Malignant neoplasm of upper-outer quadrant of left breast in female, estrogen receptor positive ([place]) Staging form: Breast, AJCC 8th Edition - Clinical stage from 03/01/2020: Stage IV (cT4, cN0, pM1, G3, ER+, PR-, HER2-) - Signed by [name], MD on 03/06/2020    Malignant neoplasm of upper-outer quadrant of left breast in female, estrogen receptor positive ([place])  02/28/2020 Mammogram   Diagnostic Mammogram 02/28/20  IMPRESSION The 3.9 cm ill defined mass in the left breast posterior depth central 4.3cm to the nipple seen on the mediolateral oblique view onlt with associated difssue skin thickening is highly suspicious for malignancy.    03/01/2020 Cancer Staging   Staging form: Breast, AJCC 8th Edition - Clinical stage from 03/01/2020: Stage IV (cT4, cN0, pM1, G3, ER+, PR-, HER2-) - Signed by [name], MD on 03/06/2020   03/01/2020 Initial Biopsy   Diagnosis 03/01/20  1. Breast, left, needle core biopsy, 9 o'clock, 3-4cmfn - INVASIVE DUCTAL CARCINOMA. SEE NOTE 2. Lymph node, needle/core biopsy, right axilla - INVASIVE DUCTAL CARCINOMA. SEE NOTE Diagnosis Note 1. Invasive carcinoma measures 1.5 cm in greatest linear dimension and appears grade 3. Dr. [name] reviewed the case and concurs with the diagnosis. A breast prognostic  profile (ER, PR, Ki-67 and HER2) is pending and will be reported in an addendum. Dr. [name] was notified on 03/04/2020. 2. Carcinoma measures 0.6 cm in greatest linear dimension and appears grade 3. Lymphoid tissue is not identified - this may represent a completely replaced lymph node. A breast prognostic profile (ER, PR, Ki-67 and HER2) is pending and will be reported in an addendum. Dr. [name] reviewed the case and concurs with the diagnosis. Dr. [name] was notified on 03/04/2020.   03/01/2020 Receptors her2   1. PROGNOSTIC INDICATORS Results: IMMUNOHISTOCHEMICAL AND MORPHOMETRIC ANALYSIS PERFORMED MANUALLY The tumor cells are NEGATIVE for Her2 (0). Estrogen Receptor: 20%, POSITIVE, WEAK STAINING INTENSITY Progesterone Receptor: 0%, NEGATIVE Proliferation Marker Ki67: 40% COMMENT: The negative hormone receptor study(ies) in this case has an internal positive control.    2. PROGNOSTIC INDICATORS Results: IMMUNOHISTOCHEMICAL AND MORPHOMETRIC ANALYSIS PERFORMED MANUALLY The tumor cells are NEGATIVE for Her2 (1+). Estrogen Receptor: 10%, POSITIVE, WEAK STAINING INTENSITY Progesterone Receptor: 0%, NEGATIVE Proliferation Marker Ki67: 40% COMMENT: The negative hormone receptor study(ies) in this case has no internal positive control.   03/05/2020 Initial Diagnosis   Malignant neoplasm of upper-outer quadrant of left breast in female, estrogen receptor positive ([place])   03/18/2020 Breast MRI   IMPRESSION: 1. 7 x 9 x 6 cm area of suspicious non masslike enhancement within the central/UPPER RIGHT breast with anterior skin thickening. Given biopsy-proven metastatic RIGHT axillary lymph node, tissue sampling of the anterior and posterior aspects of this non masslike RIGHT breast enhancement is recommended to exclude malignancy. 2. 7 x 6 x 5 cm diffuse masslike and nonmasslike enhancement within the majority of the remaining LEFT breast with diffuse LEFT breast  skin thickening, compatible  with malignancy. Abnormal enhancement is noted along the anterior aspect of the LEFT pectoralis muscle. 3. Three abnormal RIGHT axillary lymph nodes, 1 which is biopsy proven to be metastatic disease. No abnormal LEFT axillary lymph nodes identified.   03/20/2020 Echocardiogram   Baseline ECHO  IMPRESSIONS     1. The patient was reported to be in pain during the study and not able  to participate in this study. All that can be said is that the LV and RV  function are grossly normal. Would recommend to repeat this study when/if  the patient is able to tolerate  the exam.   2. Left ventricular ejection fraction, by estimation, is 60 to 65%. The  left ventricle has normal function. Left ventricular endocardial border  not optimally defined to evaluate regional wall motion. Left ventricular  diastolic function could not be  evaluated.   3. Right ventricular systolic function is normal. The right ventricular  size is normal.   4. The mitral valve was not well visualized. No evidence of mitral valve  regurgitation.   5. The aortic valve was not well visualized. Aortic valve regurgitation  is not visualized.   6. The inferior vena cava is normal in size with greater than 50%  respiratory variability, suggesting right atrial pressure of 3 mmHg.    03/20/2020 Imaging   CT CAP W contrast  IMPRESSION: 1. Small right axillary/subpectoral lymph nodes. Difficult to exclude metastatic disease. 2. Subtle 6 mm low-attenuation lesion in the dome of the liver, not well seen previously. Lesion is too small to characterize. Further evaluation could be performed with MR abdomen without and with contrast, as clinically indicated. 3. Hepatic steatosis. 4. Aortic atherosclerosis (ICD10-I70.0). Coronary artery calcification.     03/20/2020 Imaging   Whole body bone scan  IMPRESSION: 1. Increased activity noted over the right breast, possibly related to the patient's right breast cancer.   2.  Punctate area of increased activity over the left maxillary region, most likely related to sinus disease or dental disease. Increased activity noted over the left mandible also possibly related to dental disease. No other focal bony abnormalities to suggest metastatic disease identified.   03/22/2020 Procedure   PAC placement by Dr Donell Beers    03/22/2020 -  Chemotherapy   AC q2weeks for 4 cycles 03/22/20-05/03/20 followed by weekly Taxol and Carboplatin q3weeks for 12 weeks starting 05/17/20.    03/22/2020 Pathology Results   FINAL MICROSCOPIC DIAGNOSIS:   A. BREAST, RIGHT, PUNCH BIOPSY:  - Carcinoma involving dermal lymphatics.  - See comment.   B. BREAST, LEFT, PUNCH BIOPSY:  - Carcinoma involving dermal lymphatics.  - See comment.   COMMENT:  The morphology is compatible with ductal breast carcinoma.    ADDENDUM:   PROGNOSTIC INDICATOR RESULTS:   Immunohistochemical and morphometric analysis performed manually   The tumor cells are NEGATIVE for Her2 (0%).   Estrogen Receptor:       NEGATIVE, 0%  Progesterone Receptor:   NEGATIVE, 0%       CURRENT THERAPY:  AC q2weeks for 4 cycles 03/22/20-05/03/20 followed by weekly Taxol and Carboplatin q3weeks for 12 weeks starting 05/17/20.   INTERVAL HISTORY:  ALESIA OSHIELDS is here for a follow up and treatment. She presents to the clinic alone. She notes she tolerated CT well. She notes she had enough energy. She denies neuropathy so far and had minimal side effects overall. She notes she did not start her cryotherapy yet, but is willing  to. She notes carbohydrates still taste like cardboard. She has not been eating carbohydrates as much lately and weight is trending down. She was able to eat [place] food last week, but otherwise pastas she does not eat. She notes she has met with dietician. She has been adding protein in her diet. She takes oral potassium BID and Prenatal Vitamin.     REVIEW OF SYSTEMS:   Constitutional: Denies fevers,  chills (+) Mild weight loss Eyes: Denies blurriness of vision Ears, nose, mouth, throat, and face: Denies mucositis or sore throat Respiratory: Denies cough, dyspnea or wheezes Cardiovascular: Denies palpitation, chest discomfort or lower extremity swelling Gastrointestinal:  Denies nausea, heartburn or change in bowel habits Skin: Denies abnormal skin rashes Lymphatics: Denies new lymphadenopathy or easy bruising Neurological:Denies numbness, tingling or new weaknesses Behavioral/Psych: Mood is stable, no new changes  All other systems were reviewed with the patient and are negative.  MEDICAL HISTORY:  Past Medical History:  Diagnosis Date  . Breast cancer (HCC) dx'd 1993   left  . History of left breast cancer   . HIV infection (HCC)   . Hypertension     SURGICAL HISTORY: Past Surgical History:  Procedure Laterality Date  . BREAST BIOPSY Bilateral 03/22/2020   Procedure: BILATERAL BREAST PUNCH BIOPSIES;  Surgeon: Almond Lint, MD;  Location: [facility] OR;  Service: General;  Laterality: Bilateral;  . BREAST LUMPECTOMY    . CESAREAN SECTION    . left breast lumpectomy  1993  . PORTACATH PLACEMENT Right 03/22/2020   Procedure: INSERTION PORT-A-CATH WITH ULTRASOUND GUIDANCE;  Surgeon: Almond Lint, MD;  Location: [facility] OR;  Service: General;  Laterality: Right;    I have reviewed the social history and family history with the patient and they are unchanged from previous note.  ALLERGIES:  is allergic to codeine, other, grapefruit bioflavonoid complex, pomegranate [punica], and shellfish allergy.  MEDICATIONS:  Current Outpatient Medications  Medication Sig Dispense Refill  . acetaminophen (TYLENOL) 500 MG tablet Take 1,000 mg by mouth every 6 (six) hours as needed for moderate pain.    Marland Kitchen emtricitabine-rilpivir-tenofovir AF (ODEFSEY) 200-25-25 MG TABS tablet Take 1 tablet by mouth daily with breakfast. 30 tablet 11  . hydrochlorothiazide (HYDRODIURIL) 25 MG tablet Take 1 tablet (25 mg  total) by mouth daily. 30 tablet 11  . lidocaine-prilocaine (EMLA) cream Apply to affected area once 30 g 3  . ondansetron (ZOFRAN) 8 MG tablet Take 1 tablet (8 mg total) by mouth 2 (two) times daily as needed. Start on the third day after chemotherapy. 30 tablet 2  . potassium chloride SA (KLOR-CON) 20 MEQ tablet Take 1 tablet (20 mEq total) by mouth 2 (two) times daily. Take 1 tab twice daily for 3 days then 1 tab once daily 60 tablet 1  . prochlorperazine (COMPAZINE) 10 MG tablet Take 1 tablet (10 mg total) by mouth every 6 (six) hours as needed (Nausea or vomiting). 30 tablet 2  . oxyCODONE (OXY IR/ROXICODONE) 5 MG immediate release tablet Take 1 tablet (5 mg total) by mouth every 6 (six) hours as needed for severe pain. (Patient not taking: Reported on 05/24/2020) 15 tablet 0   No current facility-administered medications for this visit.    PHYSICAL EXAMINATION: ECOG PERFORMANCE STATUS: 2 - Symptomatic, <50% confined to bed  Vitals:   05/24/20 0826  BP: 125/74  Pulse: 103  Resp: 18  Temp: 98.9 F (37.2 C)  SpO2: 100%   Filed Weights   05/24/20 0826  Weight: 140 lb 14.4  oz (63.9 kg)    GENERAL:alert, no distress and comfortable SKIN: skin color, texture, turgor are normal, no rashes or significant lesions EYES: normal, Conjunctiva are pink and non-injected, sclera clear  NECK: supple, thyroid normal size, non-tender, without nodularity LYMPH:  no palpable lymphadenopathy in the cervical, axillary  LUNGS: clear to auscultation and percussion with normal breathing effort HEART: regular rate & rhythm and no murmurs and no lower extremity edema ABDOMEN:abdomen soft, non-tender and normal bowel sounds Musculoskeletal:no cyanosis of digits and no clubbing  NEURO: alert & oriented x 3 with fluent speech, no focal motor/sensory deficits BREAST:(+) Skin thickeningmeasuring now 8x10cm (previously 10x7cm)of her left breast with hyperpigmentation, mostly stable without skin  breakdown(+) Right breast palpable mass is softer now 5x2.5cm (previously 4.5x3cm)in RUQ with skin erythema. (+) 2cm palpable right axillary LN, now softer(+) Right breast larger than left   LABORATORY DATA:  I have reviewed the data as listed CBC Latest Ref Rng & Units 05/24/2020 05/17/2020 05/03/2020  WBC 4.0 - 10.5 K/uL 3.2(L) 5.5 8.5  Hemoglobin 12.0 - 15.0 g/dL 7.6(L) 8.0(L) 9.7(L)  Hematocrit 36 - 46 % 22.6(L) 23.7(L) 28.8(L)  Platelets 150 - 400 K/uL 225 185 325     CMP Latest Ref Rng & Units 05/24/2020 05/17/2020 05/03/2020  Glucose 70 - 99 mg/dL 112(H) 112(H) 110(H)  BUN 8 - 23 mg/dL _0 Creatinine 0.44 - 1.00 mg/dL 1.00 1.17(H) 0.93  Sodium 135 - 145 mmol/L 136 136 140  Potassium 3.5 - 5.1 mmol/L 3.2(L) 3.3(L) 3.0(LL)  Chloride 98 - 111 mmol/L 100 101 104  CO2 22 - 32 mmol/L _1 Calcium 8.9 - 10.3 mg/dL 9.7 8.8(L) 9.0  Total Protein 6.5 - 8.1 g/dL 7.4 7.0 6.9  Total Bilirubin 0.3 - 1.2 mg/dL 0.4 0.3 <0.2(L)  Alkaline Phos 38 - 126 U/L 124 148(H) 174(H)  AST 15 - 41 U/L _2 ALT 0 - 44 U/L _3 RADIOGRAPHIC STUDIES: I have personally reviewed the radiological images as listed and agreed with the findings in the report. No results found.   ASSESSMENT & PLAN:  Victoria Coffey is a 64 y.o. female with    1.Left breast cancer,cT4N0M1, possible stageIVwithinflammatory to skin of B/l breast (triple negative) andright breat cancer in [facility] withaxillary node metastasis, ERweakly+,PR/HER2-, Azucena Kuba -She was recently diagnosed with left breast cancer in 02/2020.Her Mammogram showed an at least 4cm mass of left breast. Based on initial exam sheprobably hasskin involvementand her entire left breast is occupied by cancer. Her mammogram also showed right LN enlargement. 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 It is not definitive if her right LN are related to her left breast cancer, or if she  hasasecond right breast cancer -Her breast MRIfrom 5/17/21showedleft known breast cancer, she also has a large non-mass-like enhancement in the central upper right breast, highly suspicious for malignancy. Dr. [name] did punch skin biopsy of both right and left breaston 03/22/20, whichallcame back positive for carcinomawith ER/PR/HER2 negative markers.We discussed the option of right breast biopsy,versus right mastectomy. Patient would like to proceed with bilateral mastectomy.No need for right breast mass biopsy in this case. -Staging scans were negative for distant metastasis -Istarted her on neoadjuvantchemotherapy as first-line treatmentwithAdriamycin and Cytoxan which she completed 4 cycles5/21/21-05/03/20. She then proceeded with weekly Taxol and carboplatin every 3 weeks for 12 weeksstarting 05/17/20. Goal of therapy is to downstage her cancer andhopefully  she will become a candidate for surgical resection. -After 4 cycles of AC chemo her right breast mass has decreased in size on exam, no significant change of left breast cancer. Will monitor on weekly TC chemo.  -She tolerated week 1 CT well with minimal side effects and she was able to maintain energy. She will start using cryotherapy with week 2 treatment.  -On physical exam her breast masses continue to get softer. Labs reviewed, WBC 3.2 and Hg 7.6, otherwise stable. Overall adequate to proceed with week 2 Taxol today. Continue weekly.  -F/u next week.   2. Anemia, secondary to chemo  -Will give blood transfusion for Hg <7.  -She is currently on prenatal vitamin.  -Her Hg did further decrease to 7.6 today (05/24/20). Will give Blood transfusion tomorrow, she is agreeable.  consent obtained   3. Weight Loss, Low appetite  -Her weight has been trending down. She notes she has been fine with lose weight. She has lost 10 pounds over a month. She has increased vegetables, smoothies in diet and less fatty foods less solid  food overall.  -I encouraged her to focus on maintaining weight instead of losing weight while on chemo so she is strong enough to continue. -Due to taste change she has not been consuming much carbohydrates. She had increased protein in her diet. I also encouraged her to increase calories.  -She will continue to f/u with Dietician.   4. H/o Left breast cancer, Dx in 1993,Genetic Testinghas not been done yet.  5. Comorbidities: HIV(+)Dx 1993, HTN -Her HIV has been undetectable and well controlled. She is currently being seen by ID Dr Graylon Good  -BP has been normal lately and given her lower potassium, will hold HCTZ for now (05/24/20).   6.Mild transaminitis  -Unclear etiology, presents before chemotherapy -Her LFTs have mildly progressed further.Her6/16/21 MRIabdomenwas benign. -Her LFTs have resolved (05/24/20)  7. Elevated Cr  -Her Cr is elevated at 1.17 (05/17/20). -I encouraged her to continue about 48 ounces of water a day.   -Cr did improved and normal today  8. Hypokalemia  -Secondary to chemo  -Currently on oral potassium BID.  -K at 3.2 today (05/24/20). I recommend she increase potassium to TID, she is agreeable.    PLAN: -Blood transfusion 2u today or tomorrow  -Lab reviewed, adequate to proceed with week 2 Taxol today  -Hold HCTZ for now or reduced to half dose and increase Oral potassium to TID  -Lab, flush, F/uand chemo next week    No problem-specific Assessment & Plan notes found for this encounter.   Orders Placed This Encounter  Procedures  . Informed Consent Details: Physician/Practitioner Attestation; Transcribe to consent form and obtain patient signature    Order Specific Question:   Physician/Practitioner attestation of informed consent for blood and or blood product transfusion    Answer:   I, the physician/practitioner, attest that I have discussed with the patient the benefits, risks, side effects, alternatives, likelihood of achieving  goals and potential problems during recovery for the procedure that I have provided informed consent.    Order Specific Question:   Product(s)    Answer:   All Product(s)   All questions were answered. The patient knows to call the clinic with any problems, questions or concerns. No barriers to learning was detected. The total time spent in the appointment was 30 minutes.     [name], MD 05/24/2020   I, Joslyn Devon, am acting as scribe for [name], MD.  I have reviewed the above documentation for accuracy and completeness, and I agree with the above.       

## 2020-05-20 NOTE — Telephone Encounter (Signed)
-----   Message from Wylene Men, RN sent at 05/17/2020  4:24 PM EDT ----- Regarding: FENG 1ST TIME TAXOL/CARBO Patient received 1st  time Taxol/carbo.  Tolerated well. No c/o or s/s of discomfort or distress.

## 2020-05-21 ENCOUNTER — Other Ambulatory Visit: Payer: Self-pay

## 2020-05-21 ENCOUNTER — Ambulatory Visit: Payer: BC Managed Care – PPO

## 2020-05-24 ENCOUNTER — Telehealth: Payer: Self-pay | Admitting: *Deleted

## 2020-05-24 ENCOUNTER — Inpatient Hospital Stay: Payer: BC Managed Care – PPO

## 2020-05-24 ENCOUNTER — Encounter: Payer: Self-pay | Admitting: Hematology

## 2020-05-24 ENCOUNTER — Encounter: Payer: Self-pay | Admitting: *Deleted

## 2020-05-24 ENCOUNTER — Other Ambulatory Visit: Payer: Self-pay

## 2020-05-24 ENCOUNTER — Other Ambulatory Visit: Payer: Self-pay | Admitting: *Deleted

## 2020-05-24 ENCOUNTER — Ambulatory Visit: Payer: BC Managed Care – PPO

## 2020-05-24 ENCOUNTER — Inpatient Hospital Stay (HOSPITAL_BASED_OUTPATIENT_CLINIC_OR_DEPARTMENT_OTHER): Payer: BC Managed Care – PPO | Admitting: Hematology

## 2020-05-24 VITALS — BP 125/74 | HR 103 | Temp 98.9°F | Resp 18 | Ht 61.0 in | Wt 140.9 lb

## 2020-05-24 DIAGNOSIS — Z95828 Presence of other vascular implants and grafts: Secondary | ICD-10-CM

## 2020-05-24 DIAGNOSIS — C50412 Malignant neoplasm of upper-outer quadrant of left female breast: Secondary | ICD-10-CM

## 2020-05-24 DIAGNOSIS — D649 Anemia, unspecified: Secondary | ICD-10-CM | POA: Diagnosis not present

## 2020-05-24 DIAGNOSIS — Z5111 Encounter for antineoplastic chemotherapy: Secondary | ICD-10-CM | POA: Diagnosis not present

## 2020-05-24 DIAGNOSIS — Z170 Estrogen receptor positive status [ER+]: Secondary | ICD-10-CM

## 2020-05-24 LAB — CBC WITH DIFFERENTIAL (CANCER CENTER ONLY)
Abs Immature Granulocytes: 0.03 10*3/uL (ref 0.00–0.07)
Basophils Absolute: 0 10*3/uL (ref 0.0–0.1)
Basophils Relative: 0 %
Eosinophils Absolute: 0 10*3/uL (ref 0.0–0.5)
Eosinophils Relative: 0 %
HCT: 22.6 % — ABNORMAL LOW (ref 36.0–46.0)
Hemoglobin: 7.6 g/dL — ABNORMAL LOW (ref 12.0–15.0)
Immature Granulocytes: 1 %
Lymphocytes Relative: 19 %
Lymphs Abs: 0.6 10*3/uL — ABNORMAL LOW (ref 0.7–4.0)
MCH: 29.6 pg (ref 26.0–34.0)
MCHC: 33.6 g/dL (ref 30.0–36.0)
MCV: 87.9 fL (ref 80.0–100.0)
Monocytes Absolute: 0.4 10*3/uL (ref 0.1–1.0)
Monocytes Relative: 13 %
Neutro Abs: 2.2 10*3/uL (ref 1.7–7.7)
Neutrophils Relative %: 67 %
Platelet Count: 225 10*3/uL (ref 150–400)
RBC: 2.57 MIL/uL — ABNORMAL LOW (ref 3.87–5.11)
RDW: 17.2 % — ABNORMAL HIGH (ref 11.5–15.5)
WBC Count: 3.2 10*3/uL — ABNORMAL LOW (ref 4.0–10.5)
nRBC: 0 % (ref 0.0–0.2)

## 2020-05-24 LAB — CMP (CANCER CENTER ONLY)
ALT: 21 U/L (ref 0–44)
AST: 26 U/L (ref 15–41)
Albumin: 3.2 g/dL — ABNORMAL LOW (ref 3.5–5.0)
Alkaline Phosphatase: 124 U/L (ref 38–126)
Anion gap: 11 (ref 5–15)
BUN: 13 mg/dL (ref 8–23)
CO2: 25 mmol/L (ref 22–32)
Calcium: 9.7 mg/dL (ref 8.9–10.3)
Chloride: 100 mmol/L (ref 98–111)
Creatinine: 1 mg/dL (ref 0.44–1.00)
GFR, Est AFR Am: >60 mL/min (ref >60)
GFR, Estimated: 60 mL/min — ABNORMAL LOW (ref >60)
Glucose, Bld: 112 mg/dL — ABNORMAL HIGH (ref 70–99)
Potassium: 3.2 mmol/L — ABNORMAL LOW (ref 3.5–5.1)
Sodium: 136 mmol/L (ref 135–145)
Total Bilirubin: 0.4 mg/dL (ref 0.3–1.2)
Total Protein: 7.4 g/dL (ref 6.5–8.1)

## 2020-05-24 LAB — SAMPLE TO BLOOD BANK

## 2020-05-24 LAB — ABO/RH: ABO/RH(D): O POS

## 2020-05-24 MED ORDER — HEPARIN SOD (PORK) LOCK FLUSH 100 UNIT/ML IV SOLN
500.0000 [IU] | Freq: Once | INTRAVENOUS | Status: AC | PRN
  Administered 2020-05-24: 500 [IU]
  Filled 2020-05-24: qty 5

## 2020-05-24 MED ORDER — DIPHENHYDRAMINE HCL 50 MG/ML IJ SOLN
INTRAMUSCULAR | Status: AC
  Filled 2020-05-24: qty 1

## 2020-05-24 MED ORDER — FAMOTIDINE IN NACL 20-0.9 MG/50ML-% IV SOLN
20.0000 mg | Freq: Once | INTRAVENOUS | Status: AC
  Administered 2020-05-24: 20 mg via INTRAVENOUS

## 2020-05-24 MED ORDER — SODIUM CHLORIDE 0.9% FLUSH
10.0000 mL | Freq: Once | INTRAVENOUS | Status: AC
  Administered 2020-05-24: 10 mL
  Filled 2020-05-24: qty 10

## 2020-05-24 MED ORDER — SODIUM CHLORIDE 0.9 % IV SOLN
Freq: Once | INTRAVENOUS | Status: AC
  Filled 2020-05-24: qty 250

## 2020-05-24 MED ORDER — SODIUM CHLORIDE 0.9 % IV SOLN
80.0000 mg/m2 | Freq: Once | INTRAVENOUS | Status: AC
  Administered 2020-05-24: 132 mg via INTRAVENOUS
  Filled 2020-05-24: qty 22

## 2020-05-24 MED ORDER — DIPHENHYDRAMINE HCL 50 MG/ML IJ SOLN
50.0000 mg | Freq: Once | INTRAMUSCULAR | Status: AC
  Administered 2020-05-24: 50 mg via INTRAVENOUS

## 2020-05-24 MED ORDER — SODIUM CHLORIDE 0.9 % IV SOLN
20.0000 mg | Freq: Once | INTRAVENOUS | Status: AC
  Administered 2020-05-24: 20 mg via INTRAVENOUS
  Filled 2020-05-24: qty 20

## 2020-05-24 MED ORDER — SODIUM CHLORIDE 0.9% FLUSH
10.0000 mL | INTRAVENOUS | Status: DC | PRN
  Administered 2020-05-24: 10 mL
  Filled 2020-05-24: qty 10

## 2020-05-24 MED ORDER — FAMOTIDINE IN NACL 20-0.9 MG/50ML-% IV SOLN
INTRAVENOUS | Status: AC
  Filled 2020-05-24: qty 50

## 2020-05-24 NOTE — Patient Instructions (Signed)
[facility] Discharge Instructions for Patients Receiving Chemotherapy  Today you received the following chemotherapy agent: Taxol.  To help prevent nausea and vomiting after your treatment, we encourage you to take your nausea medication as directed.  If you develop nausea and vomiting that is not controlled by your nausea medication, call the clinic.   BELOW ARE SYMPTOMS THAT SHOULD BE REPORTED IMMEDIATELY:  *FEVER GREATER THAN 100.5 F  *CHILLS WITH OR WITHOUT FEVER  NAUSEA AND VOMITING THAT IS NOT CONTROLLED WITH YOUR NAUSEA MEDICATION  *UNUSUAL SHORTNESS OF BREATH  *UNUSUAL BRUISING OR BLEEDING  TENDERNESS IN MOUTH AND THROAT WITH OR WITHOUT PRESENCE OF ULCERS  *URINARY PROBLEMS  *BOWEL PROBLEMS  UNUSUAL RASH Items with * indicate a potential emergency and should be followed up as soon as possible.  Feel free to call the clinic should you have any questions or concerns. The clinic phone number is [phone].  Please show the CHEMO ALERT CARD at check-in to the Emergency Department and triage nurse.   

## 2020-05-24 NOTE — Telephone Encounter (Signed)
Confirmed appt for blood transfusion for 7/26 at 8am. Denies further questions or needs. Relate treatment went well this morning.

## 2020-05-24 NOTE — Progress Notes (Signed)
OK per MD Burr Medico to proceed with treatment today (7/23) with low Hgb, pt getting blood transfusion on Monday (7/26).  Pt aware to keep blue blood bracelet on for transfusion on Monday.  Second blood sample drawn from patient at end of treatment per blood bank protocol.  Pt aware to expect call from scheduling or [name] office regarding time for blood transfusion appt on Monday.

## 2020-05-27 ENCOUNTER — Other Ambulatory Visit: Payer: Self-pay

## 2020-05-27 ENCOUNTER — Inpatient Hospital Stay: Payer: BC Managed Care – PPO

## 2020-05-27 ENCOUNTER — Telehealth: Payer: Self-pay | Admitting: Hematology

## 2020-05-27 DIAGNOSIS — Z170 Estrogen receptor positive status [ER+]: Secondary | ICD-10-CM

## 2020-05-27 DIAGNOSIS — Z5111 Encounter for antineoplastic chemotherapy: Secondary | ICD-10-CM | POA: Diagnosis not present

## 2020-05-27 DIAGNOSIS — D649 Anemia, unspecified: Secondary | ICD-10-CM

## 2020-05-27 LAB — PREPARE RBC (CROSSMATCH)

## 2020-05-27 MED ORDER — SODIUM CHLORIDE 0.9% IV SOLUTION
250.0000 mL | Freq: Once | INTRAVENOUS | Status: AC
  Administered 2020-05-27: 250 mL via INTRAVENOUS
  Filled 2020-05-27: qty 250

## 2020-05-27 MED ORDER — SODIUM CHLORIDE 0.9% FLUSH
10.0000 mL | INTRAVENOUS | Status: AC | PRN
  Administered 2020-05-27: 10 mL
  Filled 2020-05-27: qty 10

## 2020-05-27 MED ORDER — HEPARIN SOD (PORK) LOCK FLUSH 100 UNIT/ML IV SOLN
500.0000 [IU] | Freq: Every day | INTRAVENOUS | Status: AC | PRN
  Administered 2020-05-27: 500 [IU]
  Filled 2020-05-27: qty 5

## 2020-05-27 NOTE — Progress Notes (Signed)
[place]   [phone] [phone]   Clinic Follow up Note   Patient Care Team: [name], MD as PCP - General (Infectious Diseases) [name], MD as PCP - Infectious Diseases (Infectious Diseases) [name], MD as Consulting Physician (General Surgery) [name], MD as Consulting Physician (Hematology) [name], MD as Consulting Physician (Radiation Oncology) [name], RN as Oncology Nurse Navigator [name], RN as Oncology Nurse Navigator  Date of Service:  05/31/2020  CHIEF COMPLAINT: F/u of left breast cancer  SUMMARY OF ONCOLOGIC HISTORY: Oncology History Overview Note  Cancer Staging Malignant neoplasm of upper-outer quadrant of left breast in female, estrogen receptor positive ([place]) Staging form: Breast, AJCC 8th Edition - Clinical stage from 03/01/2020: Stage IV (cT4, cN0, pM1, G3, ER+, PR-, HER2-) - Signed by [name], MD on 03/06/2020    Malignant neoplasm of upper-outer quadrant of left breast in female, estrogen receptor positive ([place])  02/28/2020 Mammogram   Diagnostic Mammogram 02/28/20  IMPRESSION The 3.9 cm ill defined mass in the left breast posterior depth central 4.3cm to the nipple seen on the mediolateral oblique view onlt with associated difssue skin thickening is highly suspicious for malignancy.    03/01/2020 Cancer Staging   Staging form: Breast, AJCC 8th Edition - Clinical stage from 03/01/2020: Stage IV (cT4, cN0, pM1, G3, ER+, PR-, HER2-) - Signed by [name], MD on 03/06/2020   03/01/2020 Initial Biopsy   Diagnosis 03/01/20  1. Breast, left, needle core biopsy, 9 o'clock, 3-4cmfn - INVASIVE DUCTAL CARCINOMA. SEE NOTE 2. Lymph node, needle/core biopsy, right axilla - INVASIVE DUCTAL CARCINOMA. SEE NOTE Diagnosis Note 1. Invasive carcinoma measures 1.5 cm in greatest linear dimension and appears grade 3. Dr. [name] reviewed the case and concurs with the diagnosis. A breast prognostic  profile (ER, PR, Ki-67 and HER2) is pending and will be reported in an addendum. Dr. [name] was notified on 03/04/2020. 2. Carcinoma measures 0.6 cm in greatest linear dimension and appears grade 3. Lymphoid tissue is not identified - this may represent a completely replaced lymph node. A breast prognostic profile (ER, PR, Ki-67 and HER2) is pending and will be reported in an addendum. Dr. [name] reviewed the case and concurs with the diagnosis. Dr. [name] was notified on 03/04/2020.   03/01/2020 Receptors her2   1. PROGNOSTIC INDICATORS Results: IMMUNOHISTOCHEMICAL AND MORPHOMETRIC ANALYSIS PERFORMED MANUALLY The tumor cells are NEGATIVE for Her2 (0). Estrogen Receptor: 20%, POSITIVE, WEAK STAINING INTENSITY Progesterone Receptor: 0%, NEGATIVE Proliferation Marker Ki67: 40% COMMENT: The negative hormone receptor study(ies) in this case has an internal positive control.    2. PROGNOSTIC INDICATORS Results: IMMUNOHISTOCHEMICAL AND MORPHOMETRIC ANALYSIS PERFORMED MANUALLY The tumor cells are NEGATIVE for Her2 (1+). Estrogen Receptor: 10%, POSITIVE, WEAK STAINING INTENSITY Progesterone Receptor: 0%, NEGATIVE Proliferation Marker Ki67: 40% COMMENT: The negative hormone receptor study(ies) in this case has no internal positive control.   03/05/2020 Initial Diagnosis   Malignant neoplasm of upper-outer quadrant of left breast in female, estrogen receptor positive ([place])   03/18/2020 Breast MRI   IMPRESSION: 1. 7 x 9 x 6 cm area of suspicious non masslike enhancement within the central/UPPER RIGHT breast with anterior skin thickening. Given biopsy-proven metastatic RIGHT axillary lymph node, tissue sampling of the anterior and posterior aspects of this non masslike RIGHT breast enhancement is recommended to exclude malignancy. 2. 7 x 6 x 5 cm diffuse masslike and nonmasslike enhancement within the majority of the remaining LEFT breast with diffuse LEFT breast  skin thickening, compatible  with malignancy. Abnormal enhancement is noted along the anterior aspect of the LEFT pectoralis muscle. 3. Three abnormal RIGHT axillary lymph nodes, 1 which is biopsy proven to be metastatic disease. No abnormal LEFT axillary lymph nodes identified.   03/20/2020 Echocardiogram   Baseline ECHO  IMPRESSIONS     1. The patient was reported to be in pain during the study and not able  to participate in this study. All that can be said is that the LV and RV  function are grossly normal. Would recommend to repeat this study when/if  the patient is able to tolerate  the exam.   2. Left ventricular ejection fraction, by estimation, is 60 to 65%. The  left ventricle has normal function. Left ventricular endocardial border  not optimally defined to evaluate regional wall motion. Left ventricular  diastolic function could not be  evaluated.   3. Right ventricular systolic function is normal. The right ventricular  size is normal.   4. The mitral valve was not well visualized. No evidence of mitral valve  regurgitation.   5. The aortic valve was not well visualized. Aortic valve regurgitation  is not visualized.   6. The inferior vena cava is normal in size with greater than 50%  respiratory variability, suggesting right atrial pressure of 3 mmHg.    03/20/2020 Imaging   CT CAP W contrast  IMPRESSION: 1. Small right axillary/subpectoral lymph nodes. Difficult to exclude metastatic disease. 2. Subtle 6 mm low-attenuation lesion in the dome of the liver, not well seen previously. Lesion is too small to characterize. Further evaluation could be performed with MR abdomen without and with contrast, as clinically indicated. 3. Hepatic steatosis. 4. Aortic atherosclerosis (ICD10-I70.0). Coronary artery calcification.     03/20/2020 Imaging   Whole body bone scan  IMPRESSION: 1. Increased activity noted over the right breast, possibly related to the patient's right breast cancer.   2.  Punctate area of increased activity over the left maxillary region, most likely related to sinus disease or dental disease. Increased activity noted over the left mandible also possibly related to dental disease. No other focal bony abnormalities to suggest metastatic disease identified.   03/22/2020 Procedure   PAC placement by Dr [name]    03/22/2020 -  Chemotherapy   AC q2weeks for 4 cycles 03/22/20-05/03/20 followed by weekly Taxol and Carboplatin q3weeks for 12 weeks starting 05/17/20.    03/22/2020 Pathology Results   FINAL MICROSCOPIC DIAGNOSIS:   A. BREAST, RIGHT, PUNCH BIOPSY:  - Carcinoma involving dermal lymphatics.  - See comment.   B. BREAST, LEFT, PUNCH BIOPSY:  - Carcinoma involving dermal lymphatics.  - See comment.   COMMENT:  The morphology is compatible with ductal breast carcinoma.    ADDENDUM:   PROGNOSTIC INDICATOR RESULTS:   Immunohistochemical and morphometric analysis performed manually   The tumor cells are NEGATIVE for Her2 (0%).   Estrogen Receptor:       NEGATIVE, 0%  Progesterone Receptor:   NEGATIVE, 0%       CURRENT THERAPY:  AC q2weeks for 4 cycles 03/22/20-05/03/20 followed by weekly Taxol and Carboplatin q3weeks for 12 weeks starting 05/17/20.Week 2 Taxol reduced to half dose and CT was dose reduced starting with week 4 due to thrombocytopenia.   INTERVAL HISTORY:  Victoria Coffey is here for a follow up and treatment. She presents to the clinic alone. She notes she tolerated her blood transfusion and felt better afterward. She notes her last chemo cycle  went well with no tingling and overall better with Taxol alone. She had less fatigue. She notes gum bleeding before each treatment and recent gum bleeding did not increase for her. She denies nay other obvious bleeding. She notes her breast has not changed for her. She notes she has been doing HCTZ every other day before stopping completely. She has been able to gain weight with lactate ice cream in  her smoothies.    REVIEW OF SYSTEMS:   Constitutional: Denies fevers, chills or abnormal weight loss Eyes: Denies blurriness of vision Ears, nose, mouth, throat, and face: Denies mucositis or sore throat Respiratory: Denies cough, dyspnea or wheezes Cardiovascular: Denies palpitation, chest discomfort or lower extremity swelling Gastrointestinal:  Denies nausea, heartburn or change in bowel habits Skin: Denies abnormal skin rashes Lymphatics: Denies new lymphadenopathy or easy bruising Neurological:Denies numbness, tingling or new weaknesses Behavioral/Psych: Mood is stable, no new changes  All other systems were reviewed with the patient and are negative.  MEDICAL HISTORY:  Past Medical History:  Diagnosis Date  . Breast cancer ([place]) dx'd 1993   left  . History of left breast cancer   . HIV infection ([place])   . Hypertension     SURGICAL HISTORY: Past Surgical History:  Procedure Laterality Date  . BREAST BIOPSY Bilateral 03/22/2020   Procedure: BILATERAL BREAST PUNCH BIOPSIES;  Surgeon: [name], MD;  Location: [facility];  Service: General;  Laterality: Bilateral;  . BREAST LUMPECTOMY    . CESAREAN SECTION    . left breast lumpectomy  1993  . PORTACATH PLACEMENT Right 03/22/2020   Procedure: INSERTION PORT-A-CATH WITH ULTRASOUND GUIDANCE;  Surgeon: [name], MD;  Location: [facility];  Service: General;  Laterality: Right;    I have reviewed the social history and family history with the patient and they are unchanged from previous note.  ALLERGIES:  is allergic to codeine, other, grapefruit bioflavonoid complex, pomegranate [punica], and shellfish allergy.  MEDICATIONS:  Current Outpatient Medications  Medication Sig Dispense Refill  . acetaminophen (TYLENOL) 500 MG tablet Take 1,000 mg by mouth every 6 (six) hours as needed for moderate pain.    Marland Kitchen emtricitabine-rilpivir-tenofovir AF (ODEFSEY) 200-25-25 MG TABS tablet Take 1 tablet by mouth daily with breakfast. 30  tablet 11  . hydrochlorothiazide (HYDRODIURIL) 25 MG tablet Take 1 tablet (25 mg total) by mouth daily. 30 tablet 11  . lidocaine-prilocaine (EMLA) cream Apply to affected area once 30 g 3  . ondansetron (ZOFRAN) 8 MG tablet Take 1 tablet (8 mg total) by mouth 2 (two) times daily as needed. Start on the third day after chemotherapy. 30 tablet 2  . oxyCODONE (OXY IR/ROXICODONE) 5 MG immediate release tablet Take 1 tablet (5 mg total) by mouth every 6 (six) hours as needed for severe pain. 15 tablet 0  . potassium chloride SA (KLOR-CON) 20 MEQ tablet Take 1 tablet (20 mEq total) by mouth 2 (two) times daily. Take 1 tab twice daily for 3 days then 1 tab once daily 60 tablet 1  . prochlorperazine (COMPAZINE) 10 MG tablet Take 1 tablet (10 mg total) by mouth every 6 (six) hours as needed (Nausea or vomiting). 30 tablet 2   No current facility-administered medications for this visit.    PHYSICAL EXAMINATION: ECOG PERFORMANCE STATUS: 1 - Symptomatic but completely ambulatory  Vitals:   05/31/20 0833  BP: 127/78  Pulse: 96  Resp: 18  Temp: 97.8 F (36.6 C)  SpO2: 100%   Filed Weights   05/31/20 2297  Weight: 145 lb 12.8 oz (66.1 kg)    GENERAL:alert, no distress and comfortable SKIN: skin color, texture, turgor are normal, no rashes or significant lesions EYES: normal, Conjunctiva are pink and non-injected, sclera clear  NECK: supple, thyroid normal size, non-tender, without nodularity LYMPH:  no palpable lymphadenopathy in the cervical, axillary  LUNGS: clear to auscultation and percussion with normal breathing effort HEART: regular rate & rhythm and no murmurs and no lower extremity edema ABDOMEN:abdomen soft, non-tender and normal bowel sounds Musculoskeletal:no cyanosis of digits and no clubbing  NEURO: alert & oriented x 3 with fluent speech, no focal motor/sensory deficits BREAST:(+) Skin thickeningis softer and measuringnow 9x7cm (previously 8x10cm)of her left breast with  hyperpigmentation, mostly stable without skin breakdown(+) Right breast palpable mass is softer now4x5cm (previously 5x2.5cm)in RUQ with skin erythema. (+) 2cm palpable right axillary LN, now softer(+) Right breast larger than left   LABORATORY DATA:  I have reviewed the data as listed CBC Latest Ref Rng & Units 05/31/2020 05/24/2020 05/17/2020  WBC 4.0 - 10.5 K/uL 2.8(L) 3.2(L) 5.5  Hemoglobin 12.0 - 15.0 g/dL 10.1(L) 7.6(L) 8.0(L)  Hematocrit 36 - 46 % 29.7(L) 22.6(L) 23.7(L)  Platelets 150 - 400 K/uL 80(L) 225 185     CMP Latest Ref Rng & Units 05/31/2020 05/24/2020 05/17/2020  Glucose 70 - 99 mg/dL 98 112(H) 112(H)  BUN 8 - 23 mg/dL '11 13 11  ' Creatinine 0.44 - 1.00 mg/dL 0.92 1.00 1.17(H)  Sodium 135 - 145 mmol/L 138 136 136  Potassium 3.5 - 5.1 mmol/L 3.9 3.2(L) 3.3(L)  Chloride 98 - 111 mmol/L 108 100 101  CO2 22 - 32 mmol/L '22 25 25  ' Calcium 8.9 - 10.3 mg/dL 9.4 9.7 8.8(L)  Total Protein 6.5 - 8.1 g/dL 6.6 7.4 7.0  Total Bilirubin 0.3 - 1.2 mg/dL 0.3 0.4 0.3  Alkaline Phos 38 - 126 U/L 140(H) 124 148(H)  AST 15 - 41 U/L '20 26 18  ' ALT 0 - 44 U/L '22 21 15      ' RADIOGRAPHIC STUDIES: I have personally reviewed the radiological images as listed and agreed with the findings in the report. No results found.   ASSESSMENT & PLAN:  JOSS FRIEDEL is a 64 y.o. female with    1.Left breast cancer,cT4N0M1, possible stageIVwithinflammatory to skin of B/l breast (triple negative) andright breat cancer in [facility] withaxillary node metastasis, ERweakly+,PR/HER2-, Victoria Coffey -She was recently diagnosed with left breast cancer in 02/2020.Her Mammogram showed an at least 4cm mass of left breast. Based on initial exam sheprobably hasskin involvementand her entire left breast is occupied by cancer. Her mammogram also showed right LN enlargement. 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 It is not definitive if her right LN are related to  her left breast cancer, or if she hasasecond right breast cancer -Her breast MRIfrom 5/17/21showedleft known breast cancer, she also has a large non-mass-like enhancement in the central upper right breast, highly suspicious for malignancy. Dr. [name] did punch skin biopsy of both right and left breaston 03/22/20, whichallcame back positive for carcinomawith ER/PR/HER2 negative markers.We discussed the option of right breast biopsy,versus right mastectomy. Patient would like to proceed with bilateral mastectomy.No need for right breast mass biopsy in this case. -Staging scans were negative for distant metastasis -Istarted her on neoadjuvantchemotherapy as first-line treatmentwithAdriamycin and Cytoxan which she completed 4cycles5/21/21-05/03/20. She then proceeded with weekly Taxoland carboplatinevery 3 weeksfor 12 weeksstarting 05/17/20. Goal of therapy isto downstage her cancer andhopefully  she will become a candidate for surgical resection. -After 4 cycles of AC chemo her right breast mass has decreased in size on exam, no significant change of left breast cancer. On weekly TC her masses are getting smaller and softer slowly. Will continue to monitor. -S/p week 2 she tolerated Taxol alone much better.  -Labs reviewed, WBC 2.8, Hg 10.1, plt 80K, albumin 2.9, Alk Phos 140. Given her moderate thrombocytopenia, I discussed holding treatment this week of reducing her chemo by half. She chose to proceed with week 3 Taxol today at half dose.  -Will reduce her dose her CT dose on week 4.  -F/u next week  2. Thrombocytopenia  -Secondary to chemo -After week 2 of CT her plt decreased to 80K on 05/31/20.  -Will given half dose taxol today and reduce CT dose starting with week 4.  -I encouraged her to watch for significant bleeding and avoid falls or injury.   3. Anemia, secondary to chemo -Will give blood transfusion for Hg <7.  -She is currently on prenatal vitamin. -Her Hg did  further decrease to 7.6on 05/24/20. She was treated with blood transfusion on 05/27/20 -Anemia responded to blood transfusion. Hg at 10.1 today (05/31/20)  4. Weight Loss, Low appetite -Her weight has been trending down.She notes she has been fine with lose weight. She has lost 10 pounds over a month.  -I encouraged her to focus on maintaining weight instead of losing weight while on chemo so she is strong enough to continue. -Due to taste change she has not been consuming much carbohydrates and due to diet change she eats less fatty foods. She had increased protein and vegetables in her diet. I also encouraged her to increase calorie intake.  -She has been able to gain weight with lactacid ice cream  -She will continue to f/u with Dietician.  5. H/o Left breast cancer, Dx in 1993,Genetic Testinghas not been done yet.  6. Comorbidities: HIV(+)Dx 1993, HTN -Her HIV has been undetectable and well controlled. She is currently being seen by ID Dr Graylon Good  -BP has been normal lately and given her lower potassium, we have held HCTZ since 05/31/20.    7.Mild transaminitis  -Unclear etiology, presents before chemotherapy. Her6/16/21 MRIabdomenwas benign. -Her LFTs have resolved (05/24/20) with mildly elevated Alk Phos.   8. Hypokalemia  -Secondary to chemo  -Currently on oral potassium BID. -K improved to 3.9 today (05/31/20). I advised her to stop HCTZ and reduce potassium to once daily.    PLAN: -I refilled potassium and antiemetics today   -Lab reviewed, adequate to proceed with week 3 Taxol today at half dose given moderate thrombocytopenia  -Lab and flush on 8/2 to monitor CBC -Lab, flush, F/uand chemo CT next week, will reduce carbo to AUC 4.5 due to her cytopenias      No problem-specific Assessment & Plan notes found for this encounter.   No orders of the defined types were placed in this encounter.  All questions were answered. The patient knows to call the  clinic with any problems, questions or concerns. No barriers to learning was detected. The total time spent in the appointment was 30 minutes.     [name], MD 05/31/2020   I, Joslyn Devon, am acting as scribe for [name], MD.   I have reviewed the above documentation for accuracy and completeness, and I agree with the above.

## 2020-05-27 NOTE — Patient Instructions (Signed)

## 2020-05-27 NOTE — Telephone Encounter (Signed)
No 7/23 los

## 2020-05-28 LAB — BPAM RBC
Blood Product Expiration Date: 202108242359
Blood Product Expiration Date: 202108242359
ISSUE DATE / TIME: 202107260917
ISSUE DATE / TIME: 202107260917
Unit Type and Rh: 5100
Unit Type and Rh: 5100

## 2020-05-28 LAB — TYPE AND SCREEN
ABO/RH(D): O POS
Antibody Screen: NEGATIVE
Unit division: 0
Unit division: 0

## 2020-05-31 ENCOUNTER — Inpatient Hospital Stay: Payer: BC Managed Care – PPO

## 2020-05-31 ENCOUNTER — Encounter: Payer: Self-pay | Admitting: Hematology

## 2020-05-31 ENCOUNTER — Other Ambulatory Visit: Payer: Self-pay

## 2020-05-31 ENCOUNTER — Telehealth: Payer: Self-pay | Admitting: Hematology

## 2020-05-31 ENCOUNTER — Inpatient Hospital Stay (HOSPITAL_BASED_OUTPATIENT_CLINIC_OR_DEPARTMENT_OTHER): Payer: BC Managed Care – PPO | Admitting: Hematology

## 2020-05-31 VITALS — BP 127/78 | HR 96 | Temp 97.8°F | Resp 18 | Ht 61.0 in | Wt 145.8 lb

## 2020-05-31 DIAGNOSIS — Z95828 Presence of other vascular implants and grafts: Secondary | ICD-10-CM

## 2020-05-31 DIAGNOSIS — C50412 Malignant neoplasm of upper-outer quadrant of left female breast: Secondary | ICD-10-CM

## 2020-05-31 DIAGNOSIS — Z170 Estrogen receptor positive status [ER+]: Secondary | ICD-10-CM

## 2020-05-31 DIAGNOSIS — Z5111 Encounter for antineoplastic chemotherapy: Secondary | ICD-10-CM | POA: Diagnosis not present

## 2020-05-31 DIAGNOSIS — B20 Human immunodeficiency virus [HIV] disease: Secondary | ICD-10-CM

## 2020-05-31 LAB — CBC WITH DIFFERENTIAL (CANCER CENTER ONLY)
Abs Immature Granulocytes: 0.01 10*3/uL (ref 0.00–0.07)
Basophils Absolute: 0 10*3/uL (ref 0.0–0.1)
Basophils Relative: 0 %
Eosinophils Absolute: 0 10*3/uL (ref 0.0–0.5)
Eosinophils Relative: 0 %
HCT: 29.7 % — ABNORMAL LOW (ref 36.0–46.0)
Hemoglobin: 10.1 g/dL — ABNORMAL LOW (ref 12.0–15.0)
Immature Granulocytes: 0 %
Lymphocytes Relative: 20 %
Lymphs Abs: 0.6 10*3/uL — ABNORMAL LOW (ref 0.7–4.0)
MCH: 29.8 pg (ref 26.0–34.0)
MCHC: 34 g/dL (ref 30.0–36.0)
MCV: 87.6 fL (ref 80.0–100.0)
Monocytes Absolute: 0.3 10*3/uL (ref 0.1–1.0)
Monocytes Relative: 12 %
Neutro Abs: 1.9 10*3/uL (ref 1.7–7.7)
Neutrophils Relative %: 68 %
Platelet Count: 80 10*3/uL — ABNORMAL LOW (ref 150–400)
RBC: 3.39 MIL/uL — ABNORMAL LOW (ref 3.87–5.11)
RDW: 16.9 % — ABNORMAL HIGH (ref 11.5–15.5)
WBC Count: 2.8 10*3/uL — ABNORMAL LOW (ref 4.0–10.5)
nRBC: 0 % (ref 0.0–0.2)

## 2020-05-31 LAB — CMP (CANCER CENTER ONLY)
ALT: 22 U/L (ref 0–44)
AST: 20 U/L (ref 15–41)
Albumin: 2.9 g/dL — ABNORMAL LOW (ref 3.5–5.0)
Alkaline Phosphatase: 140 U/L — ABNORMAL HIGH (ref 38–126)
Anion gap: 8 (ref 5–15)
BUN: 11 mg/dL (ref 8–23)
CO2: 22 mmol/L (ref 22–32)
Calcium: 9.4 mg/dL (ref 8.9–10.3)
Chloride: 108 mmol/L (ref 98–111)
Creatinine: 0.92 mg/dL (ref 0.44–1.00)
GFR, Est AFR Am: >60 mL/min (ref >60)
GFR, Estimated: >60 mL/min (ref >60)
Glucose, Bld: 98 mg/dL (ref 70–99)
Potassium: 3.9 mmol/L (ref 3.5–5.1)
Sodium: 138 mmol/L (ref 135–145)
Total Bilirubin: 0.3 mg/dL (ref 0.3–1.2)
Total Protein: 6.6 g/dL (ref 6.5–8.1)

## 2020-05-31 MED ORDER — SODIUM CHLORIDE 0.9 % IV SOLN
40.0000 mg/m2 | Freq: Once | INTRAVENOUS | Status: AC
  Administered 2020-05-31: 66 mg via INTRAVENOUS
  Filled 2020-05-31: qty 11

## 2020-05-31 MED ORDER — METHYLPREDNISOLONE SODIUM SUCC 125 MG IJ SOLR
INTRAMUSCULAR | Status: AC
  Filled 2020-05-31: qty 2

## 2020-05-31 MED ORDER — SODIUM CHLORIDE 0.9% FLUSH
10.0000 mL | Freq: Once | INTRAVENOUS | Status: AC
  Administered 2020-05-31: 10 mL
  Filled 2020-05-31: qty 10

## 2020-05-31 MED ORDER — SODIUM CHLORIDE 0.9% FLUSH
10.0000 mL | INTRAVENOUS | Status: DC | PRN
  Administered 2020-05-31: 10 mL
  Filled 2020-05-31: qty 10

## 2020-05-31 MED ORDER — FAMOTIDINE IN NACL 20-0.9 MG/50ML-% IV SOLN
20.0000 mg | Freq: Once | INTRAVENOUS | Status: AC
  Administered 2020-05-31: 20 mg via INTRAVENOUS

## 2020-05-31 MED ORDER — DIPHENHYDRAMINE HCL 50 MG/ML IJ SOLN
50.0000 mg | Freq: Once | INTRAMUSCULAR | Status: AC
  Administered 2020-05-31: 50 mg via INTRAVENOUS

## 2020-05-31 MED ORDER — SODIUM CHLORIDE 0.9 % IV SOLN
Freq: Once | INTRAVENOUS | Status: AC
  Filled 2020-05-31: qty 250

## 2020-05-31 MED ORDER — POTASSIUM CHLORIDE CRYS ER 20 MEQ PO TBCR: 20.0000 meq | EXTENDED_RELEASE_TABLET | Freq: Two times a day (BID) | ORAL | 1 refills | Status: DC

## 2020-05-31 MED ORDER — METHYLPREDNISOLONE SODIUM SUCC 125 MG IJ SOLR
100.0000 mg | Freq: Once | INTRAMUSCULAR | Status: AC
  Administered 2020-05-31: 100 mg via INTRAVENOUS

## 2020-05-31 MED ORDER — SODIUM CHLORIDE 0.9 % IV SOLN
20.0000 mg | Freq: Once | INTRAVENOUS | Status: DC
  Filled 2020-05-31: qty 2

## 2020-05-31 MED ORDER — DIPHENHYDRAMINE HCL 50 MG/ML IJ SOLN
INTRAMUSCULAR | Status: AC
  Filled 2020-05-31: qty 1

## 2020-05-31 MED ORDER — HEPARIN SOD (PORK) LOCK FLUSH 100 UNIT/ML IV SOLN
500.0000 [IU] | Freq: Once | INTRAVENOUS | Status: AC | PRN
  Administered 2020-05-31: 500 [IU]
  Filled 2020-05-31: qty 5

## 2020-05-31 MED ORDER — FAMOTIDINE IN NACL 20-0.9 MG/50ML-% IV SOLN
INTRAVENOUS | Status: AC
  Filled 2020-05-31: qty 50

## 2020-05-31 MED ORDER — PROCHLORPERAZINE MALEATE 10 MG PO TABS: 10.0000 mg | ORAL_TABLET | Freq: Four times a day (QID) | ORAL | 2 refills | Status: DC | PRN

## 2020-05-31 MED ORDER — ONDANSETRON HCL 8 MG PO TABS: 8.0000 mg | ORAL_TABLET | Freq: Two times a day (BID) | ORAL | 2 refills | Status: DC | PRN

## 2020-05-31 NOTE — Telephone Encounter (Signed)
Scheduled per 7/30 los. Pt is aware of appt time and date.

## 2020-05-31 NOTE — Patient Instructions (Signed)
[facility] Discharge Instructions for Patients Receiving Chemotherapy  Today you received the following chemotherapy agents:  Taxol.  To help prevent nausea and vomiting after your treatment, we encourage you to take your nausea medication as directed.   If you develop nausea and vomiting that is not controlled by your nausea medication, call the clinic.   BELOW ARE SYMPTOMS THAT SHOULD BE REPORTED IMMEDIATELY:  *FEVER GREATER THAN 100.5 F  *CHILLS WITH OR WITHOUT FEVER  NAUSEA AND VOMITING THAT IS NOT CONTROLLED WITH YOUR NAUSEA MEDICATION  *UNUSUAL SHORTNESS OF BREATH  *UNUSUAL BRUISING OR BLEEDING  TENDERNESS IN MOUTH AND THROAT WITH OR WITHOUT PRESENCE OF ULCERS  *URINARY PROBLEMS  *BOWEL PROBLEMS  UNUSUAL RASH Items with * indicate a potential emergency and should be followed up as soon as possible.  Feel free to call the clinic should you have any questions or concerns. The clinic phone number is [phone].  Please show the CHEMO ALERT CARD at check-in to the Emergency Department and triage nurse.   

## 2020-05-31 NOTE — Progress Notes (Signed)
Per MD okay to treat with PLT 80

## 2020-06-01 ENCOUNTER — Encounter: Payer: Self-pay | Admitting: Hematology

## 2020-06-01 LAB — HIV-1 RNA QUANT-NO REFLEX-BLD
HIV 1 RNA Quant: <20 copies/mL
LOG10 HIV-1 RNA: UNDETERMINED log10copy/mL

## 2020-06-03 ENCOUNTER — Inpatient Hospital Stay: Payer: BC Managed Care – PPO

## 2020-06-03 ENCOUNTER — Other Ambulatory Visit: Payer: Self-pay

## 2020-06-03 ENCOUNTER — Inpatient Hospital Stay: Payer: BC Managed Care – PPO | Attending: Hematology

## 2020-06-03 DIAGNOSIS — Z95828 Presence of other vascular implants and grafts: Secondary | ICD-10-CM

## 2020-06-03 DIAGNOSIS — I10 Essential (primary) hypertension: Secondary | ICD-10-CM | POA: Diagnosis not present

## 2020-06-03 DIAGNOSIS — Z170 Estrogen receptor positive status [ER+]: Secondary | ICD-10-CM | POA: Insufficient documentation

## 2020-06-03 DIAGNOSIS — R7401 Elevation of levels of liver transaminase levels: Secondary | ICD-10-CM | POA: Diagnosis not present

## 2020-06-03 DIAGNOSIS — R634 Abnormal weight loss: Secondary | ICD-10-CM | POA: Insufficient documentation

## 2020-06-03 DIAGNOSIS — Z21 Asymptomatic human immunodeficiency virus [HIV] infection status: Secondary | ICD-10-CM | POA: Insufficient documentation

## 2020-06-03 DIAGNOSIS — Z853 Personal history of malignant neoplasm of breast: Secondary | ICD-10-CM | POA: Insufficient documentation

## 2020-06-03 DIAGNOSIS — Z5111 Encounter for antineoplastic chemotherapy: Secondary | ICD-10-CM | POA: Diagnosis present

## 2020-06-03 DIAGNOSIS — E876 Hypokalemia: Secondary | ICD-10-CM | POA: Insufficient documentation

## 2020-06-03 DIAGNOSIS — Z79899 Other long term (current) drug therapy: Secondary | ICD-10-CM | POA: Diagnosis not present

## 2020-06-03 DIAGNOSIS — Z8249 Family history of ischemic heart disease and other diseases of the circulatory system: Secondary | ICD-10-CM | POA: Diagnosis not present

## 2020-06-03 DIAGNOSIS — C50412 Malignant neoplasm of upper-outer quadrant of left female breast: Secondary | ICD-10-CM

## 2020-06-03 DIAGNOSIS — D6481 Anemia due to antineoplastic chemotherapy: Secondary | ICD-10-CM | POA: Diagnosis not present

## 2020-06-03 DIAGNOSIS — Z833 Family history of diabetes mellitus: Secondary | ICD-10-CM | POA: Insufficient documentation

## 2020-06-03 LAB — CMP (CANCER CENTER ONLY)
ALT: 23 U/L (ref 0–44)
AST: 23 U/L (ref 15–41)
Albumin: 3 g/dL — ABNORMAL LOW (ref 3.5–5.0)
Alkaline Phosphatase: 137 U/L — ABNORMAL HIGH (ref 38–126)
Anion gap: 9 (ref 5–15)
BUN: 8 mg/dL (ref 8–23)
CO2: 22 mmol/L (ref 22–32)
Calcium: 9.6 mg/dL (ref 8.9–10.3)
Chloride: 108 mmol/L (ref 98–111)
Creatinine: 0.82 mg/dL (ref 0.44–1.00)
GFR, Est AFR Am: >60 mL/min (ref >60)
GFR, Estimated: >60 mL/min (ref >60)
Glucose, Bld: 95 mg/dL (ref 70–99)
Potassium: 3.8 mmol/L (ref 3.5–5.1)
Sodium: 139 mmol/L (ref 135–145)
Total Bilirubin: 0.2 mg/dL — ABNORMAL LOW (ref 0.3–1.2)
Total Protein: 6.8 g/dL (ref 6.5–8.1)

## 2020-06-03 LAB — CBC WITH DIFFERENTIAL (CANCER CENTER ONLY)
Abs Immature Granulocytes: 0.01 10*3/uL (ref 0.00–0.07)
Basophils Absolute: 0 10*3/uL (ref 0.0–0.1)
Basophils Relative: 0 %
Eosinophils Absolute: 0 10*3/uL (ref 0.0–0.5)
Eosinophils Relative: 0 %
HCT: 31.3 % — ABNORMAL LOW (ref 36.0–46.0)
Hemoglobin: 10.6 g/dL — ABNORMAL LOW (ref 12.0–15.0)
Immature Granulocytes: 0 %
Lymphocytes Relative: 18 %
Lymphs Abs: 0.6 10*3/uL — ABNORMAL LOW (ref 0.7–4.0)
MCH: 29.6 pg (ref 26.0–34.0)
MCHC: 33.9 g/dL (ref 30.0–36.0)
MCV: 87.4 fL (ref 80.0–100.0)
Monocytes Absolute: 0.2 10*3/uL (ref 0.1–1.0)
Monocytes Relative: 7 %
Neutro Abs: 2.5 10*3/uL (ref 1.7–7.7)
Neutrophils Relative %: 75 %
Platelet Count: 60 10*3/uL — ABNORMAL LOW (ref 150–400)
RBC: 3.58 MIL/uL — ABNORMAL LOW (ref 3.87–5.11)
RDW: 17.2 % — ABNORMAL HIGH (ref 11.5–15.5)
WBC Count: 3.3 10*3/uL — ABNORMAL LOW (ref 4.0–10.5)
nRBC: 0 % (ref 0.0–0.2)

## 2020-06-03 MED ORDER — SODIUM CHLORIDE 0.9% FLUSH
10.0000 mL | Freq: Once | INTRAVENOUS | Status: AC
  Administered 2020-06-03: 10 mL
  Filled 2020-06-03: qty 10

## 2020-06-03 MED ORDER — HEPARIN SOD (PORK) LOCK FLUSH 100 UNIT/ML IV SOLN
500.0000 [IU] | Freq: Once | INTRAVENOUS | Status: AC
  Administered 2020-06-03: 500 [IU]
  Filled 2020-06-03: qty 5

## 2020-06-04 ENCOUNTER — Encounter: Payer: Self-pay | Admitting: Hematology

## 2020-06-05 NOTE — Progress Notes (Signed)
[facility] OFFICE PROGRESS NOTE  [name], MD [place] [address] [place] [address]  DIAGNOSIS: F/U Breast Cancer  Oncology History Overview Note  Cancer Staging Malignant neoplasm of upper-outer quadrant of left breast in female, estrogen receptor positive ([place]) Staging form: Breast, AJCC 8th Edition - Clinical stage from 03/01/2020: Stage IV (cT4, cN0, pM1, G3, ER+, PR-, HER2-) - Signed by [name], MD on 03/06/2020    Malignant neoplasm of upper-outer quadrant of left breast in female, estrogen receptor positive ([place])  02/28/2020 Mammogram   Diagnostic Mammogram 02/28/20  IMPRESSION The 3.9 cm ill defined mass in the left breast posterior depth central 4.3cm to the nipple seen on the mediolateral oblique view onlt with associated difssue skin thickening is highly suspicious for malignancy.    03/01/2020 Cancer Staging   Staging form: Breast, AJCC 8th Edition - Clinical stage from 03/01/2020: Stage IV (cT4, cN0, pM1, G3, ER+, PR-, HER2-) - Signed by [name], MD on 03/06/2020   03/01/2020 Initial Biopsy   Diagnosis 03/01/20  1. Breast, left, needle core biopsy, 9 o'clock, 3-4cmfn - INVASIVE DUCTAL CARCINOMA. SEE NOTE 2. Lymph node, needle/core biopsy, right axilla - INVASIVE DUCTAL CARCINOMA. SEE NOTE Diagnosis Note 1. Invasive carcinoma measures 1.5 cm in greatest linear dimension and appears grade 3. Dr. [name] reviewed the case and concurs with the diagnosis. A breast prognostic profile (ER, PR, Ki-67 and HER2) is pending and will be reported in an addendum. Dr. [name] was notified on 03/04/2020. 2. Carcinoma measures 0.6 cm in greatest linear dimension and appears grade 3. Lymphoid tissue is not identified - this may represent a completely replaced lymph node. A breast prognostic profile (ER, PR, Ki-67 and HER2) is pending and will be reported in an addendum. Dr. [name] reviewed the case and concurs with the diagnosis. Dr. [name] was notified on  03/04/2020.   03/01/2020 Receptors her2   1. PROGNOSTIC INDICATORS Results: IMMUNOHISTOCHEMICAL AND MORPHOMETRIC ANALYSIS PERFORMED MANUALLY The tumor cells are NEGATIVE for Her2 (0). Estrogen Receptor: 20%, POSITIVE, WEAK STAINING INTENSITY Progesterone Receptor: 0%, NEGATIVE Proliferation Marker Ki67: 40% COMMENT: The negative hormone receptor study(ies) in this case has an internal positive control.    2. PROGNOSTIC INDICATORS Results: IMMUNOHISTOCHEMICAL AND MORPHOMETRIC ANALYSIS PERFORMED MANUALLY The tumor cells are NEGATIVE for Her2 (1+). Estrogen Receptor: 10%, POSITIVE, WEAK STAINING INTENSITY Progesterone Receptor: 0%, NEGATIVE Proliferation Marker Ki67: 40% COMMENT: The negative hormone receptor study(ies) in this case has no internal positive control.   03/05/2020 Initial Diagnosis   Malignant neoplasm of upper-outer quadrant of left breast in female, estrogen receptor positive ([place])   03/18/2020 Breast MRI   IMPRESSION: 1. 7 x 9 x 6 cm area of suspicious non masslike enhancement within the central/UPPER RIGHT breast with anterior skin thickening. Given biopsy-proven metastatic RIGHT axillary lymph node, tissue sampling of the anterior and posterior aspects of this non masslike RIGHT breast enhancement is recommended to exclude malignancy. 2. 7 x 6 x 5 cm diffuse masslike and nonmasslike enhancement within the majority of the remaining LEFT breast with diffuse LEFT breast skin thickening, compatible with malignancy. Abnormal enhancement is noted along the anterior aspect of the LEFT pectoralis muscle. 3. Three abnormal RIGHT axillary lymph nodes, 1 which is biopsy proven to be metastatic disease. No abnormal LEFT axillary lymph nodes identified.   03/20/2020 Echocardiogram   Baseline ECHO  IMPRESSIONS     1. The patient was reported to be in pain during the study and not able  to  participate in this study. All that can be said is that the LV and RV  function are  grossly normal. Would recommend to repeat this study when/if  the patient is able to tolerate  the exam.   2. Left ventricular ejection fraction, by estimation, is 60 to 65%. The  left ventricle has normal function. Left ventricular endocardial border  not optimally defined to evaluate regional wall motion. Left ventricular  diastolic function could not be  evaluated.   3. Right ventricular systolic function is normal. The right ventricular  size is normal.   4. The mitral valve was not well visualized. No evidence of mitral valve  regurgitation.   5. The aortic valve was not well visualized. Aortic valve regurgitation  is not visualized.   6. The inferior vena cava is normal in size with greater than 50%  respiratory variability, suggesting right atrial pressure of 3 mmHg.    03/20/2020 Imaging   CT CAP W contrast  IMPRESSION: 1. Small right axillary/subpectoral lymph nodes. Difficult to exclude metastatic disease. 2. Subtle 6 mm low-attenuation lesion in the dome of the liver, not well seen previously. Lesion is too small to characterize. Further evaluation could be performed with MR abdomen without and with contrast, as clinically indicated. 3. Hepatic steatosis. 4. Aortic atherosclerosis (ICD10-I70.0). Coronary artery calcification.     03/20/2020 Imaging   Whole body bone scan  IMPRESSION: 1. Increased activity noted over the right breast, possibly related to the patient's right breast cancer.   2. Punctate area of increased activity over the left maxillary region, most likely related to sinus disease or dental disease. Increased activity noted over the left mandible also possibly related to dental disease. No other focal bony abnormalities to suggest metastatic disease identified.   03/22/2020 Procedure   PAC placement by Dr [name]    03/22/2020 -  Chemotherapy   AC q2weeks for 4 cycles 03/22/20-05/03/20 followed by weekly Taxol and Carboplatin q3weeks for 12 weeks  starting 05/17/20.    03/22/2020 Pathology Results   FINAL MICROSCOPIC DIAGNOSIS:   A. BREAST, RIGHT, PUNCH BIOPSY:  - Carcinoma involving dermal lymphatics.  - See comment.   B. BREAST, LEFT, PUNCH BIOPSY:  - Carcinoma involving dermal lymphatics.  - See comment.   COMMENT:  The morphology is compatible with ductal breast carcinoma.    ADDENDUM:   PROGNOSTIC INDICATOR RESULTS:   Immunohistochemical and morphometric analysis performed manually   The tumor cells are NEGATIVE for Her2 (0%).   Estrogen Receptor:       NEGATIVE, 0%  Progesterone Receptor:   NEGATIVE, 0%      CURRENT THERAPY: AC q2weeks for 4 cycles 03/22/20-05/03/20 followed by weekly Taxoland Carboplatin q3weeksfor 12 weeks starting 05/17/20.Week 2 Taxol reduced to half dose and CT was dose reduced starting with week 4 due to thrombocytopenia  INTERVAL HISTORY: Rei Contee Stangler 64 y.o. female returns to the clinic today for a follow up visit. The patient is feeling well today without any concerning complaints. At the patient's last appointment, she had moderate thrombocytopenia. She was given the option of proceeding with treatment with half the dose of taxol or delaying treatment by 1 week. She decided to proceed with cycle #4 at a reduced dose with single agent taxol. She tolerated it well. She denies any abnormal bleeding or bruising including epistaxis, gingival bleeding, melena, hemoptysis, hematuria, or hematemesis today.   She denies any fever, chills, or night sweats. She notes some taste changes which affects her desire to  eat. She has been drinking smoothies as well as using lactate ice cream. She denies nausea, vomiting, diarrhea, or constipation. She denies chest pain, shortness of breath, cough or hemoptysis. Her breast lump is stable. She believes it may be softer than before. Denies peripheral neuropathy. She is here for evaluation before starting cycle #4 of weekly carboplatin and taxol.   MEDICAL  HISTORY: Past Medical History:  Diagnosis Date  . Breast cancer ([place]) dx'd 1993   left  . History of left breast cancer   . HIV infection ([place])   . Hypertension     ALLERGIES:  is allergic to codeine, other, grapefruit bioflavonoid complex, pomegranate [punica], and shellfish allergy.  MEDICATIONS:  Current Outpatient Medications  Medication Sig Dispense Refill  . acetaminophen (TYLENOL) 500 MG tablet Take 1,000 mg by mouth every 6 (six) hours as needed for moderate pain.    Marland Kitchen emtricitabine-rilpivir-tenofovir AF (ODEFSEY) 200-25-25 MG TABS tablet Take 1 tablet by mouth daily with breakfast. 30 tablet 11  . hydrochlorothiazide (HYDRODIURIL) 25 MG tablet Take 1 tablet (25 mg total) by mouth daily. 30 tablet 11  . lidocaine-prilocaine (EMLA) cream Apply to affected area once 30 g 3  . loratadine (CLARITIN) 10 MG tablet Take 10 mg by mouth daily. Takes 3-4 days before and after treatment    . ondansetron (ZOFRAN) 8 MG tablet Take 1 tablet (8 mg total) by mouth 2 (two) times daily as needed. Start on the third day after chemotherapy. 30 tablet 2  . oxyCODONE (OXY IR/ROXICODONE) 5 MG immediate release tablet Take 1 tablet (5 mg total) by mouth every 6 (six) hours as needed for severe pain. 15 tablet 0  . potassium chloride SA (KLOR-CON) 20 MEQ tablet Take 1 tablet (20 mEq total) by mouth 2 (two) times daily. Take 1 tab twice daily for 3 days then 1 tab once daily 60 tablet 1  . prochlorperazine (COMPAZINE) 10 MG tablet Take 1 tablet (10 mg total) by mouth every 6 (six) hours as needed (Nausea or vomiting). 30 tablet 2   Current Facility-Administered Medications  Medication Dose Route Frequency Provider Last Rate Last Admin  . sodium chloride flush (NS) 0.9 % injection 10 mL  10 mL Intravenous PRN Clarence Dunsmore L, PA-C   10 mL at 06/07/20 0910    SURGICAL HISTORY:  Past Surgical History:  Procedure Laterality Date  . BREAST BIOPSY Bilateral 03/22/2020   Procedure: BILATERAL  BREAST PUNCH BIOPSIES;  Surgeon: [name], MD;  Location: [facility];  Service: General;  Laterality: Bilateral;  . BREAST LUMPECTOMY    . CESAREAN SECTION    . left breast lumpectomy  1993  . PORTACATH PLACEMENT Right 03/22/2020   Procedure: INSERTION PORT-A-CATH WITH ULTRASOUND GUIDANCE;  Surgeon: [name], MD;  Location: [facility];  Service: General;  Laterality: Right;    REVIEW OF SYSTEMS:   Review of Systems  Constitutional: Positive for decreased appetite and weight loss. Negative for chills, fatigue, and fever.  HENT:   Negative for mouth sores, nosebleeds, sore throat and trouble swallowing.   Eyes: Negative for eye problems and icterus.  Respiratory: Negative for cough, hemoptysis, shortness of breath and wheezing.   Cardiovascular: Negative for chest pain and leg swelling.  Gastrointestinal: Negative for abdominal pain, constipation, diarrhea, nausea and vomiting.  Genitourinary: Negative for bladder incontinence, difficulty urinating, dysuria, frequency and hematuria.   Musculoskeletal: Negative for back pain, gait problem, neck pain and neck stiffness.  Skin: Negative for itching and rash.  Neurological: Negative for  dizziness, extremity weakness, gait problem, headaches, light-headedness and seizures.  Hematological: Negative for adenopathy. Does not bruise/bleed easily.  Psychiatric/Behavioral: Negative for confusion, depression and sleep disturbance. The patient is not nervous/anxious.     PHYSICAL EXAMINATION:  Blood pressure 139/83, pulse 83, temperature 97.6 F (36.4 C), temperature source Temporal, resp. rate 18, height _0  (1.549 m), weight 142 lb 9.6 oz (64.7 kg), SpO2 100 %.  ECOG PERFORMANCE STATUS: 1 - Symptomatic but completely ambulatory  Physical Exam  Constitutional: Oriented to person, place, and time and well-developed, well-nourished, and in no distress.  HENT:  Head: Normocephalic and atraumatic.  Mouth/Throat: Oropharynx is clear and moist. No  oropharyngeal exudate.  Eyes: Conjunctivae are normal. Right eye exhibits no discharge. Left eye exhibits no discharge. No scleral icterus.  Neck: Normal range of motion. Neck supple.  Cardiovascular: Normal rate, regular rhythm, normal heart sounds and intact distal pulses.   Pulmonary/Chest: Effort normal and breath sounds normal. No respiratory distress. No wheezes. No rales.  Abdominal: Soft. Bowel sounds are normal. Exhibits no distension and no mass. There is no tenderness.  Musculoskeletal: Normal range of motion. Exhibits no edema.  Lymphadenopathy:    No cervical adenopathy.  Neurological: Alert and oriented to person, place, and time. Exhibits normal muscle tone. Gait normal. Coordination normal.  Skin: Skin is warm and dry. No rash noted. Not diaphoretic. No erythema. No pallor.  Psychiatric: Mood, memory and judgment normal.  BREAST:(+) Skin thickening of her left breast with hyperpigmentation (+). ~10.5x7.5 cm. Right breast 4x3 cm palpable mass in RUQ with mild skin erythema. Right breast larger than left.  LABORATORY DATA: Lab Results  Component Value Date   WBC 2.8 (L) 06/07/2020   HGB 10.2 (L) 06/07/2020   HCT 29.8 (L) 06/07/2020   MCV 86.9 06/07/2020   PLT 51 (L) 06/07/2020      Chemistry      Component Value Date/Time   NA 136 06/07/2020 0810   K 3.5 06/07/2020 0810   CL 103 06/07/2020 0810   CO2 24 06/07/2020 0810   BUN 15 06/07/2020 0810   CREATININE 0.85 06/07/2020 0810   CREATININE 0.82 02/09/2020 0855      Component Value Date/Time   CALCIUM 9.6 06/07/2020 0810   ALKPHOS 168 (H) 06/07/2020 0810   AST 28 06/07/2020 0810   ALT 27 06/07/2020 0810   BILITOT 0.2 (L) 06/07/2020 0810       RADIOGRAPHIC STUDIES:  No results found.   ASSESSMENT/PLAN:  Victoria Coffey is a 64 y.o. female with   1.Left breast cancer,cT4N0M1, possible stageIVwithinflammatory to skin of B/l breast (triple negative) andright breat cancer in [facility] withaxillary node  metastasis, ERweakly+,PR/HER2-, Azucena Kuba -She was recently diagnosed with left breast cancer in 02/2020.Her Mammogram showed an at least 4cm mass of left breast. Based on initial exam sheprobably hasskin involvementand her entire left breast is occupied by cancer. Her mammogram also showed right LN enlargement. 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 It is not definitive if her right LN are related to her left breast cancer, or if she hasasecond right breast cancer -Her breast MRIfrom 5/17/21showedleft known breast cancer, she also has a large non-mass-like enhancement in the central upper right breast, highly suspicious for malignancy. Dr. [name] did punch skin biopsy of both right and left breaston 03/22/20, whichallcame back positive for carcinomawith ER/PR/HER2 negative markers.Dr. Burr Medico previously discussed the option of right breast biopsy,versus right mastectomy. Patient would like to proceed  with bilateral mastectomy.No need for right breast mass biopsy in this case. -Staging scans were negative for distant metastasis -Dr. Verline Lema her on neoadjuvantchemotherapy as first-line treatmentwithAdriamycin and Cytoxanwhich she completed 4cycles5/21/21-05/03/20.She then proceeded with weekly Taxoland carboplatinevery 3 weeksfor 12 weeksstarting 05/17/20. Goal of therapy isto downstage her cancer andhopefully she will become a candidate for surgical resection. -After 4 cycles of AC chemo herright breast mass has decreased in size on exam, no significant change of left breast cancer. On weekly TC her masses are getting smaller and softer slowly. Will continue to monitor. She does not notice a significant difference compared to last week.  -S/p week 3 she tolerated Taxol alone much better.  -Labs reviewed, WBC 2.8 , Hg 10.2, plt 51K, albumin 3.1, Alk Phos 168 -We will delay her treatment by 1 week to allow time for her  thrombocytopenia to improve.  -F/u next week  2. Thrombocytopenia  -Secondary to chemo -After week 2 of CT her plt decreased to 80K on 05/31/20.  -Half dose taxol given on 05/31/20 and reduce CT dose starting with week 4.  -Platelets 51k today. Denies bleeding at this time -She was encouraged her to watch for significant bleeding and avoid falls or injury.   3. Anemia, secondary to chemo -Will give blood transfusion for Hg <7.  -She is currently on prenatal vitamin. -Her Hgdid further decrease to 7.6on 05/24/20. She was treated with blood transfusion on 05/27/20 -Anemia responded to blood transfusion. Hg at 10.2 today (06/07/20)  4. Weight Loss, Low appetite -Her weight has been trending down.She notes she has been trying to lose weight. She has lost 10 poundsover amonth.  -Dr. Cleotis Nipper her to focuson maintaining weight instead of losing weight while on chemo so she is strong enough to continue. -Due to taste change she has not been consuming much carbohydrates and due to diet change she eats less fatty foods. She had increased protein and vegetables in her diet. Dr. Burr Medico also encouraged her to increase calorie intake.  -She has been able to gain weight with lactacid ice cream  -She will continue to f/u with Dietician.  5. H/o Left breast cancer, Dx in 1993,Genetic Testinghas not been done yet.  6. Comorbidities: HIV(+)Dx 1993, HTN -Her HIV has been undetectable and well controlled. She is currently being seen by ID Dr Graylon Good -BP has been normal lately and given her lower potassium, we have held HCTZ since 05/31/20.   7.Mild transaminitis  -Unclear etiology, presents before chemotherapy. Her6/16/21 MRIabdomenwas benign. -Her LFTshave resolved (05/24/20) with mildly elevated Alk Phos.   8. Hypokalemia  -Secondary to chemo  -Currently on oral potassiumonce daily. -Dr. Burr Medico advised her to stop HCTZ and reduce potassium to once daily. -K 3.5 today  (06/07/2020).   Plan -Delay treatment by 1 week -Monitor for signs and symptoms of bleeding. -F/U with Dr. Burr Medico in 1 week   No orders of the defined types were placed in this encounter.    Mallerie Blok L Maree Ainley, PA-C 06/07/20

## 2020-06-06 ENCOUNTER — Encounter: Payer: Self-pay | Admitting: *Deleted

## 2020-06-07 ENCOUNTER — Inpatient Hospital Stay: Payer: BC Managed Care – PPO

## 2020-06-07 ENCOUNTER — Other Ambulatory Visit: Payer: Self-pay

## 2020-06-07 ENCOUNTER — Inpatient Hospital Stay (HOSPITAL_BASED_OUTPATIENT_CLINIC_OR_DEPARTMENT_OTHER): Payer: BC Managed Care – PPO | Admitting: Physician Assistant

## 2020-06-07 VITALS — BP 139/83 | HR 83 | Temp 97.6°F | Resp 18 | Ht 61.0 in | Wt 142.6 lb

## 2020-06-07 DIAGNOSIS — Z95828 Presence of other vascular implants and grafts: Secondary | ICD-10-CM

## 2020-06-07 DIAGNOSIS — D696 Thrombocytopenia, unspecified: Secondary | ICD-10-CM | POA: Diagnosis not present

## 2020-06-07 DIAGNOSIS — Z170 Estrogen receptor positive status [ER+]: Secondary | ICD-10-CM | POA: Diagnosis not present

## 2020-06-07 DIAGNOSIS — C50412 Malignant neoplasm of upper-outer quadrant of left female breast: Secondary | ICD-10-CM

## 2020-06-07 DIAGNOSIS — Z5111 Encounter for antineoplastic chemotherapy: Secondary | ICD-10-CM | POA: Diagnosis not present

## 2020-06-07 LAB — CMP (CANCER CENTER ONLY)
ALT: 27 U/L (ref 0–44)
AST: 28 U/L (ref 15–41)
Albumin: 3.1 g/dL — ABNORMAL LOW (ref 3.5–5.0)
Alkaline Phosphatase: 168 U/L — ABNORMAL HIGH (ref 38–126)
Anion gap: 9 (ref 5–15)
BUN: 15 mg/dL (ref 8–23)
CO2: 24 mmol/L (ref 22–32)
Calcium: 9.6 mg/dL (ref 8.9–10.3)
Chloride: 103 mmol/L (ref 98–111)
Creatinine: 0.85 mg/dL (ref 0.44–1.00)
GFR, Est AFR Am: >60 mL/min (ref >60)
GFR, Estimated: >60 mL/min (ref >60)
Glucose, Bld: 98 mg/dL (ref 70–99)
Potassium: 3.5 mmol/L (ref 3.5–5.1)
Sodium: 136 mmol/L (ref 135–145)
Total Bilirubin: 0.2 mg/dL — ABNORMAL LOW (ref 0.3–1.2)
Total Protein: 7 g/dL (ref 6.5–8.1)

## 2020-06-07 LAB — CBC WITH DIFFERENTIAL (CANCER CENTER ONLY)
Abs Immature Granulocytes: 0 10*3/uL (ref 0.00–0.07)
Basophils Absolute: 0 10*3/uL (ref 0.0–0.1)
Basophils Relative: 0 %
Eosinophils Absolute: 0 10*3/uL (ref 0.0–0.5)
Eosinophils Relative: 0 %
HCT: 29.8 % — ABNORMAL LOW (ref 36.0–46.0)
Hemoglobin: 10.2 g/dL — ABNORMAL LOW (ref 12.0–15.0)
Immature Granulocytes: 0 %
Lymphocytes Relative: 25 %
Lymphs Abs: 0.7 10*3/uL (ref 0.7–4.0)
MCH: 29.7 pg (ref 26.0–34.0)
MCHC: 34.2 g/dL (ref 30.0–36.0)
MCV: 86.9 fL (ref 80.0–100.0)
Monocytes Absolute: 0.2 10*3/uL (ref 0.1–1.0)
Monocytes Relative: 7 %
Neutro Abs: 1.9 10*3/uL (ref 1.7–7.7)
Neutrophils Relative %: 68 %
Platelet Count: 51 10*3/uL — ABNORMAL LOW (ref 150–400)
RBC: 3.43 MIL/uL — ABNORMAL LOW (ref 3.87–5.11)
RDW: 17.2 % — ABNORMAL HIGH (ref 11.5–15.5)
WBC Count: 2.8 10*3/uL — ABNORMAL LOW (ref 4.0–10.5)
nRBC: 0 % (ref 0.0–0.2)

## 2020-06-07 MED ORDER — SODIUM CHLORIDE 0.9% FLUSH
10.0000 mL | Freq: Once | INTRAVENOUS | Status: AC
  Administered 2020-06-07: 10 mL
  Filled 2020-06-07: qty 10

## 2020-06-07 MED ORDER — HEPARIN SOD (PORK) LOCK FLUSH 100 UNIT/ML IV SOLN
500.0000 [IU] | Freq: Once | INTRAVENOUS | Status: AC
  Administered 2020-06-07: 500 [IU] via INTRAVENOUS
  Filled 2020-06-07: qty 5

## 2020-06-07 MED ORDER — SODIUM CHLORIDE 0.9% FLUSH
10.0000 mL | INTRAVENOUS | Status: DC | PRN
  Administered 2020-06-07: 10 mL via INTRAVENOUS
  Filled 2020-06-07: qty 10

## 2020-06-07 NOTE — Patient Instructions (Signed)

## 2020-06-08 LAB — CANCER ANTIGEN 27.29: CA 27.29: 129.2 U/mL — ABNORMAL HIGH (ref 0.0–38.6)

## 2020-06-10 ENCOUNTER — Ambulatory Visit: Payer: BC Managed Care – PPO

## 2020-06-10 NOTE — Progress Notes (Signed)
[place]   [phone] Fax:([phone]) [phone]   Clinic Follow up Note   Patient Care Team: [name], MD as PCP - General (Infectious Diseases) [name], MD as PCP - Infectious Diseases (Infectious Diseases) [name], MD as Consulting Physician (General Surgery) [name], MD as Consulting Physician (Hematology) [name], MD as Consulting Physician (Radiation Oncology) [name], RN as Oncology Nurse Navigator [name], RN as Oncology Nurse Navigator  Date of Service:  06/13/2020  CHIEF COMPLAINT: F/u of left breast cancer  SUMMARY OF ONCOLOGIC HISTORY: Oncology History Overview Note  Cancer Staging Malignant neoplasm of upper-outer quadrant of left breast in female, estrogen receptor positive ([place]) Staging form: Breast, AJCC 8th Edition - Clinical stage from 03/01/2020: Stage IV (cT4, cN0, pM1, G3, ER+, PR-, HER2-) - Signed by [name], MD on 03/06/2020    Malignant neoplasm of upper-outer quadrant of left breast in female, estrogen receptor positive ([place])  02/28/2020 Mammogram   Diagnostic Mammogram 02/28/20  IMPRESSION The 3.9 cm ill defined mass in the left breast posterior depth central 4.3cm to the nipple seen on the mediolateral oblique view onlt with associated difssue skin thickening is highly suspicious for malignancy.    03/01/2020 Cancer Staging   Staging form: Breast, AJCC 8th Edition - Clinical stage from 03/01/2020: Stage IV (cT4, cN0, pM1, G3, ER+, PR-, HER2-) - Signed by [name], MD on 03/06/2020   03/01/2020 Initial Biopsy   Diagnosis 03/01/20  1. Breast, left, needle core biopsy, 9 o'clock, 3-4cmfn - INVASIVE DUCTAL CARCINOMA. SEE NOTE 2. Lymph node, needle/core biopsy, right axilla - INVASIVE DUCTAL CARCINOMA. SEE NOTE Diagnosis Note 1. Invasive carcinoma measures 1.5 cm in greatest linear dimension and appears grade 3. Dr. [name] reviewed the case and concurs with the diagnosis. A breast prognostic  profile (ER, PR, Ki-67 and HER2) is pending and will be reported in an addendum. Dr. [name] was notified on 03/04/2020. 2. Carcinoma measures 0.6 cm in greatest linear dimension and appears grade 3. Lymphoid tissue is not identified - this may represent a completely replaced lymph node. A breast prognostic profile (ER, PR, Ki-67 and HER2) is pending and will be reported in an addendum. Dr. [name] reviewed the case and concurs with the diagnosis. Dr. [name] was notified on 03/04/2020.   03/01/2020 Receptors her2   1. PROGNOSTIC INDICATORS Results: IMMUNOHISTOCHEMICAL AND MORPHOMETRIC ANALYSIS PERFORMED MANUALLY The tumor cells are NEGATIVE for Her2 (0). Estrogen Receptor: 20%, POSITIVE, WEAK STAINING INTENSITY Progesterone Receptor: 0%, NEGATIVE Proliferation Marker Ki67: 40% COMMENT: The negative hormone receptor study(ies) in this case has an internal positive control.    2. PROGNOSTIC INDICATORS Results: IMMUNOHISTOCHEMICAL AND MORPHOMETRIC ANALYSIS PERFORMED MANUALLY The tumor cells are NEGATIVE for Her2 (1+). Estrogen Receptor: 10%, POSITIVE, WEAK STAINING INTENSITY Progesterone Receptor: 0%, NEGATIVE Proliferation Marker Ki67: 40% COMMENT: The negative hormone receptor study(ies) in this case has no internal positive control.   03/05/2020 Initial Diagnosis   Malignant neoplasm of upper-outer quadrant of left breast in female, estrogen receptor positive ([name])   03/18/2020 Breast MRI   IMPRESSION: 1. 7 x 9 x 6 cm area of suspicious non masslike enhancement within the central/UPPER RIGHT breast with anterior skin thickening. Given biopsy-proven metastatic RIGHT axillary lymph node, tissue sampling of the anterior and posterior aspects of this non masslike RIGHT breast enhancement is recommended to exclude malignancy. 2. 7 x 6 x 5 cm diffuse masslike and nonmasslike enhancement within the majority of the remaining LEFT breast with diffuse LEFT breast  skin thickening, compatible  with malignancy. Abnormal enhancement is noted along the anterior aspect of the LEFT pectoralis muscle. 3. Three abnormal RIGHT axillary lymph nodes, 1 which is biopsy proven to be metastatic disease. No abnormal LEFT axillary lymph nodes identified.   03/20/2020 Echocardiogram   Baseline ECHO  IMPRESSIONS     1. The patient was reported to be in pain during the study and not able  to participate in this study. All that can be said is that the LV and RV  function are grossly normal. Would recommend to repeat this study when/if  the patient is able to tolerate  the exam.   2. Left ventricular ejection fraction, by estimation, is 60 to 65%. The  left ventricle has normal function. Left ventricular endocardial border  not optimally defined to evaluate regional wall motion. Left ventricular  diastolic function could not be  evaluated.   3. Right ventricular systolic function is normal. The right ventricular  size is normal.   4. The mitral valve was not well visualized. No evidence of mitral valve  regurgitation.   5. The aortic valve was not well visualized. Aortic valve regurgitation  is not visualized.   6. The inferior vena cava is normal in size with greater than 50%  respiratory variability, suggesting right atrial pressure of 3 mmHg.    03/20/2020 Imaging   CT CAP W contrast  IMPRESSION: 1. Small right axillary/subpectoral lymph nodes. Difficult to exclude metastatic disease. 2. Subtle 6 mm low-attenuation lesion in the dome of the liver, not well seen previously. Lesion is too small to characterize. Further evaluation could be performed with MR abdomen without and with contrast, as clinically indicated. 3. Hepatic steatosis. 4. Aortic atherosclerosis (ICD10-I70.0). Coronary artery calcification.     03/20/2020 Imaging   Whole body bone scan  IMPRESSION: 1. Increased activity noted over the right breast, possibly related to the patient's right breast cancer.   2.  Punctate area of increased activity over the left maxillary region, most likely related to sinus disease or dental disease. Increased activity noted over the left mandible also possibly related to dental disease. No other focal bony abnormalities to suggest metastatic disease identified.   03/22/2020 Procedure   PAC placement by Dr [name]    03/22/2020 -  Chemotherapy   AC q2weeks for 4 cycles 03/22/20-05/03/20 followed by weekly Taxol and Carboplatin q3weeks for 12 weeks starting 05/17/20.    03/22/2020 Pathology Results   FINAL MICROSCOPIC DIAGNOSIS:   A. BREAST, RIGHT, PUNCH BIOPSY:  - Carcinoma involving dermal lymphatics.  - See comment.   B. BREAST, LEFT, PUNCH BIOPSY:  - Carcinoma involving dermal lymphatics.  - See comment.   COMMENT:  The morphology is compatible with ductal breast carcinoma.    ADDENDUM:   PROGNOSTIC INDICATOR RESULTS:   Immunohistochemical and morphometric analysis performed manually   The tumor cells are NEGATIVE for Her2 (0%).   Estrogen Receptor:       NEGATIVE, 0%  Progesterone Receptor:   NEGATIVE, 0%       CURRENT THERAPY:  AC q2weeks for 4 cycles 03/22/20-05/03/20 followed by weekly Taxoland Carboplatin q3weeksfor 12 weeks starting 05/17/20.Week 2 Taxol reduced to half dose and CT was dose reduced starting with week 4 due to thrombocytopenia. Given pancytopenia will change Carboplatin to 50% dose weekly starting with week 7 (06/13/20)  INTERVAL HISTORY:  Harmonii Karle Hartfield is here for a follow up and treatment. She presents to the clinic alone. She notes she feels better with  an extra week off chemo due to her thrombocytopenia. She denies any bleeding, fever or chills. She notes her energy has improved. She has been eating and drinking adequately and able to maintain her weight during treatment. She notes no change in her left breast since her last visit. She denies breast pain.     REVIEW OF SYSTEMS:   Constitutional: Denies fevers, chills or  abnormal weight loss Eyes: Denies blurriness of vision Ears, nose, mouth, throat, and face: Denies mucositis or sore throat Respiratory: Denies cough, dyspnea or wheezes Cardiovascular: Denies palpitation, chest discomfort or lower extremity swelling Gastrointestinal:  Denies nausea, heartburn or change in bowel habits Skin: Denies abnormal skin rashes Lymphatics: Denies new lymphadenopathy or easy bruising Neurological:Denies numbness, tingling or new weaknesses Behavioral/Psych: Mood is stable, no new changes  All other systems were reviewed with the patient and are negative.  MEDICAL HISTORY:  Past Medical History:  Diagnosis Date  . Breast cancer ([place]) dx'd 1993   left  . History of left breast cancer   . HIV infection ([place])   . Hypertension     SURGICAL HISTORY: Past Surgical History:  Procedure Laterality Date  . BREAST BIOPSY Bilateral 03/22/2020   Procedure: BILATERAL BREAST PUNCH BIOPSIES;  Surgeon: [name], MD;  Location: [facility];  Service: General;  Laterality: Bilateral;  . BREAST LUMPECTOMY    . CESAREAN SECTION    . left breast lumpectomy  1993  . PORTACATH PLACEMENT Right 03/22/2020   Procedure: INSERTION PORT-A-CATH WITH ULTRASOUND GUIDANCE;  Surgeon: [name], MD;  Location: [facility];  Service: General;  Laterality: Right;    I have reviewed the social history and family history with the patient and they are unchanged from previous note.  ALLERGIES:  is allergic to codeine, other, grapefruit bioflavonoid complex, pomegranate [punica], and shellfish allergy.  MEDICATIONS:  Current Outpatient Medications  Medication Sig Dispense Refill  . acetaminophen (TYLENOL) 500 MG tablet Take 1,000 mg by mouth every 6 (six) hours as needed for moderate pain.    Marland Kitchen emtricitabine-rilpivir-tenofovir AF (ODEFSEY) 200-25-25 MG TABS tablet Take 1 tablet by mouth daily with breakfast. 30 tablet 11  . hydrochlorothiazide (HYDRODIURIL) 25 MG tablet Take 1 tablet (25 mg total)  by mouth daily. 30 tablet 11  . lidocaine-prilocaine (EMLA) cream Apply to affected area once 30 g 3  . loratadine (CLARITIN) 10 MG tablet Take 10 mg by mouth daily. Takes 3-4 days before and after treatment    . ondansetron (ZOFRAN) 8 MG tablet Take 1 tablet (8 mg total) by mouth 2 (two) times daily as needed. Start on the third day after chemotherapy. 30 tablet 2  . oxyCODONE (OXY IR/ROXICODONE) 5 MG immediate release tablet Take 1 tablet (5 mg total) by mouth every 6 (six) hours as needed for severe pain. 15 tablet 0  . potassium chloride SA (KLOR-CON) 20 MEQ tablet Take 1 tablet (20 mEq total) by mouth 2 (two) times daily. Take 1 tab twice daily for 3 days then 1 tab once daily 60 tablet 1  . prochlorperazine (COMPAZINE) 10 MG tablet Take 1 tablet (10 mg total) by mouth every 6 (six) hours as needed (Nausea or vomiting). 30 tablet 2   No current facility-administered medications for this visit.    PHYSICAL EXAMINATION: ECOG PERFORMANCE STATUS: 1 - Symptomatic but completely ambulatory  Vitals:   06/13/20 0830  BP: 136/90  Pulse: 86  Resp: 18  Temp: (!) 96.9 F (36.1 C)  SpO2: 100%   Filed Weights  06/13/20 0830  Weight: 142 lb 6.4 oz (64.6 kg)    GENERAL:alert, no distress and comfortable SKIN: skin color, texture, turgor are normal, no rashes or significant lesions EYES: normal, Conjunctiva are pink and non-injected, sclera clear  NECK: supple, thyroid normal size, non-tender, without nodularity LYMPH:  no palpable lymphadenopathy in the cervical, axillary  LUNGS: clear to auscultation and percussion with normal breathing effort HEART: regular rate & rhythm and no murmurs and no lower extremity edema ABDOMEN:abdomen soft, non-tender and normal bowel sounds Musculoskeletal:no cyanosis of digits and no clubbing  NEURO: alert & oriented x 3 with fluent speech, no focal motor/sensory deficits BREAST:(+) Skin thickeningis softer and measuringnow 8x10cm (previously 9x7cm)of  her left breast with hyperpigmentation, mostly stable without skin breakdown(+) Right breast palpable massis softernow 5x3cm (previously 4x5cm)in RUQ with skin erythema. (+) 2cm palpable right axillary LN, now softer(+) Right breast larger than left   LABORATORY DATA:  I have reviewed the data as listed CBC Latest Ref Rng & Units 06/13/2020 06/07/2020 06/03/2020  WBC 4.0 - 10.5 K/uL 2.5(L) 2.8(L) 3.3(L)  Hemoglobin 12.0 - 15.0 g/dL 10.4(L) 10.2(L) 10.6(L)  Hematocrit 36 - 46 % 30.8(L) 29.8(L) 31.3(L)  Platelets 150 - 400 K/uL 110(L) 51(L) 60(L)     CMP Latest Ref Rng & Units 06/13/2020 06/07/2020 06/03/2020  Glucose 70 - 99 mg/dL 98 98 95  BUN 8 - 23 mg/dL _0 Creatinine 0.44 - 1.00 mg/dL 0.87 0.85 0.82  Sodium 135 - 145 mmol/L 139 136 139  Potassium 3.5 - 5.1 mmol/L 3.7 3.5 3.8  Chloride 98 - 111 mmol/L 105 103 108  CO2 22 - 32 mmol/L _1 Calcium 8.9 - 10.3 mg/dL 9.8 9.6 9.6  Total Protein 6.5 - 8.1 g/dL 7.1 7.0 6.8  Total Bilirubin 0.3 - 1.2 mg/dL 0.2(L) 0.2(L) 0.2(L)  Alkaline Phos 38 - 126 U/L 173(H) 168(H) 137(H)  AST 15 - 41 U/L _2 ALT 0 - 44 U/L _3 RADIOGRAPHIC STUDIES: I have personally reviewed the radiological images as listed and agreed with the findings in the report. No results found.   ASSESSMENT & PLAN:  YAMARIS CUMMINGS is a 64 y.o. female with    1.Left breast cancer,cT4N0M1, possible stageIVwithinflammatory to skin of B/l breast (triple negative) andright breat cancer in [facility] withaxillary node metastasis, ERweakly+,PR/HER2-, Azucena Kuba -She was recently diagnosed with left breast cancer in 02/2020.Her Mammogram showed an at least 4cm mass of left breast. Based on initial exam sheprobably hasskin involvementand her entire left breast is occupied by cancer. Her mammogram also showed right LN enlargement. 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 It is not definitive if her  right LN are related to her left breast cancer, or if she hasasecond right breast cancer -Her breast MRIfrom 5/17/21showedleft known breast cancer, she also has a large non-mass-like enhancement in the central upper right breast, highly suspicious for malignancy. Dr. [name] did punch skin biopsy of both right and left breaston 03/22/20, whichallcame back positive for carcinomawith ER/PR/HER2 negative markers.We discussed the option of right breast biopsy,versus right mastectomy. Patient would like to proceed with bilateral mastectomy.No need for right breast mass biopsy in this case. -Staging scans were negative for distant metastasis -Istarted her on neoadjuvantchemotherapy as first-line treatmentwithAdriamycin and Cytoxanwhich she completed 4cycles5/21/21-05/03/20.She then proceeded with weekly Taxoland carboplatinevery 3 weeksfor 12 weeksstarting 05/17/20.  -Cycle 2 chemotherapy was postponed due to  significant thrombocytopenia.  She has recovered a better, will change Carboplatin to AUC=2 today for cycle 2. If not enough will d/c carboplatin.  -Labs reviewed, WBC 2.5, Hg 10.4, plt 110K, ANC 1.4. Overall adequate to proceed with Taxol and 50% dose Carboplatin today.  -F/u next week.   2. Thrombocytopenia  -Secondary to chemo -After week 2 of CT her plt decreased to 80K on 05/31/20.  -With week 3 I gave half dose taxol and reduced CT dose starting with week 4. Week 4 was postponed 1 week.  -I encouraged her to watch for significant bleeding and avoid falls or injury.  -After holding chemo 1 week, her plt did improve to 110K (06/13/20).   3. Anemia, secondary to chemo -Will give blood transfusion for Hg <7.  -She is currently on prenatal vitamin. -Her Hgdid further decrease to 7.6on 05/24/20. She was treated with blood transfusion on 05/27/20 -Anemia responded to blood transfusion. Her anemia has been mild and stable in 10 range lately on treatment.   4. Weight Loss,  Low appetite -Her weight has been trending down.She notes she has been fine with lose weight. She has lost 10 poundsover amonth.  -Iencouraged her to focuson maintaining weight instead of losing weight while on chemo so she is strong enough to continue. -Due to taste change she has not been consuming much carbohydrates and due to diet change she eats less fatty foods. She had increased protein and vegetables in her diet. I also encouraged her to increase calorie intake.  -She has been able to gain weight with lactacid ice cream.  -She continues to eat and drink adequately and maintain her weight. She will continue to f/u with Dietician.  5. H/o Left breast cancer, Dx in 1993,Genetic Testinghas not been done yet.  6. Comorbidities: HIV(+)Dx 1993, HTN -Her HIV has been undetectable and well controlled. She is currently being seen by ID Dr Graylon Good -BP has been normal lately and given her lower potassium, we have held HCTZ since 05/31/20.   7.Mild transaminitis  -Unclear etiology, presents before chemotherapy. Her6/16/21 MRIabdomenwas benign. -Her LFTshave resolved (05/24/20) with mildly elevated Alk Phos.   8. Hypokalemia  -Secondary to chemo  -Due to improved potassium, I advised her to stop HCTZ and reduce potassium to once daily (05/31/20)   PLAN: -Lab reviewed, adequate to proceed with week 7 Taxoland Carboplatin with dose reduction to AUC=2 today  -Lab, flush, F/uand chemo next week   No problem-specific Assessment & Plan notes found for this encounter.   No orders of the defined types were placed in this encounter.  All questions were answered. The patient knows to call the clinic with any problems, questions or concerns. No barriers to learning was detected. The total time spent in the appointment was 30 minutes.     [name], MD 06/13/2020   I, Joslyn Devon, am acting as scribe for [name], MD.   I have reviewed the above documentation for  accuracy and completeness, and I agree with the above.

## 2020-06-13 ENCOUNTER — Inpatient Hospital Stay (HOSPITAL_BASED_OUTPATIENT_CLINIC_OR_DEPARTMENT_OTHER): Payer: BC Managed Care – PPO | Admitting: Hematology

## 2020-06-13 ENCOUNTER — Other Ambulatory Visit: Payer: Self-pay

## 2020-06-13 ENCOUNTER — Inpatient Hospital Stay: Payer: BC Managed Care – PPO

## 2020-06-13 ENCOUNTER — Inpatient Hospital Stay: Payer: BC Managed Care – PPO | Admitting: Nutrition

## 2020-06-13 ENCOUNTER — Telehealth: Payer: Self-pay | Admitting: Hematology

## 2020-06-13 ENCOUNTER — Encounter: Payer: Self-pay | Admitting: Hematology

## 2020-06-13 VITALS — BP 136/90 | HR 86 | Temp 96.9°F | Resp 18 | Ht 61.0 in | Wt 142.4 lb

## 2020-06-13 DIAGNOSIS — C50412 Malignant neoplasm of upper-outer quadrant of left female breast: Secondary | ICD-10-CM

## 2020-06-13 DIAGNOSIS — Z95828 Presence of other vascular implants and grafts: Secondary | ICD-10-CM

## 2020-06-13 DIAGNOSIS — Z170 Estrogen receptor positive status [ER+]: Secondary | ICD-10-CM

## 2020-06-13 DIAGNOSIS — Z5111 Encounter for antineoplastic chemotherapy: Secondary | ICD-10-CM | POA: Diagnosis not present

## 2020-06-13 LAB — CBC WITH DIFFERENTIAL (CANCER CENTER ONLY)
Abs Immature Granulocytes: 0.01 10*3/uL (ref 0.00–0.07)
Basophils Absolute: 0 10*3/uL (ref 0.0–0.1)
Basophils Relative: 0 %
Eosinophils Absolute: 0 10*3/uL (ref 0.0–0.5)
Eosinophils Relative: 1 %
HCT: 30.8 % — ABNORMAL LOW (ref 36.0–46.0)
Hemoglobin: 10.4 g/dL — ABNORMAL LOW (ref 12.0–15.0)
Immature Granulocytes: 0 %
Lymphocytes Relative: 32 %
Lymphs Abs: 0.8 10*3/uL (ref 0.7–4.0)
MCH: 29.2 pg (ref 26.0–34.0)
MCHC: 33.8 g/dL (ref 30.0–36.0)
MCV: 86.5 fL (ref 80.0–100.0)
Monocytes Absolute: 0.3 10*3/uL (ref 0.1–1.0)
Monocytes Relative: 11 %
Neutro Abs: 1.4 10*3/uL — ABNORMAL LOW (ref 1.7–7.7)
Neutrophils Relative %: 56 %
Platelet Count: 110 10*3/uL — ABNORMAL LOW (ref 150–400)
RBC: 3.56 MIL/uL — ABNORMAL LOW (ref 3.87–5.11)
RDW: 17.8 % — ABNORMAL HIGH (ref 11.5–15.5)
WBC Count: 2.5 10*3/uL — ABNORMAL LOW (ref 4.0–10.5)
nRBC: 0 % (ref 0.0–0.2)

## 2020-06-13 LAB — CMP (CANCER CENTER ONLY)
ALT: 25 U/L (ref 0–44)
AST: 24 U/L (ref 15–41)
Albumin: 3.3 g/dL — ABNORMAL LOW (ref 3.5–5.0)
Alkaline Phosphatase: 173 U/L — ABNORMAL HIGH (ref 38–126)
Anion gap: 11 (ref 5–15)
BUN: 12 mg/dL (ref 8–23)
CO2: 23 mmol/L (ref 22–32)
Calcium: 9.8 mg/dL (ref 8.9–10.3)
Chloride: 105 mmol/L (ref 98–111)
Creatinine: 0.87 mg/dL (ref 0.44–1.00)
GFR, Est AFR Am: >60 mL/min (ref >60)
GFR, Estimated: >60 mL/min (ref >60)
Glucose, Bld: 98 mg/dL (ref 70–99)
Potassium: 3.7 mmol/L (ref 3.5–5.1)
Sodium: 139 mmol/L (ref 135–145)
Total Bilirubin: 0.2 mg/dL — ABNORMAL LOW (ref 0.3–1.2)
Total Protein: 7.1 g/dL (ref 6.5–8.1)

## 2020-06-13 MED ORDER — SODIUM CHLORIDE 0.9 % IV SOLN
Freq: Once | INTRAVENOUS | Status: AC
  Filled 2020-06-13: qty 250

## 2020-06-13 MED ORDER — HEPARIN SOD (PORK) LOCK FLUSH 100 UNIT/ML IV SOLN
500.0000 [IU] | Freq: Once | INTRAVENOUS | Status: AC | PRN
  Administered 2020-06-13: 500 [IU]
  Filled 2020-06-13: qty 5

## 2020-06-13 MED ORDER — METHYLPREDNISOLONE SODIUM SUCC 125 MG IJ SOLR
50.0000 mg | Freq: Once | INTRAMUSCULAR | Status: AC
  Administered 2020-06-13: 50 mg via INTRAVENOUS

## 2020-06-13 MED ORDER — PALONOSETRON HCL INJECTION 0.25 MG/5ML
INTRAVENOUS | Status: AC
  Filled 2020-06-13: qty 5

## 2020-06-13 MED ORDER — SODIUM CHLORIDE 0.9% FLUSH
10.0000 mL | INTRAVENOUS | Status: DC | PRN
  Administered 2020-06-13: 10 mL
  Filled 2020-06-13: qty 10

## 2020-06-13 MED ORDER — DIPHENHYDRAMINE HCL 50 MG/ML IJ SOLN
50.0000 mg | Freq: Once | INTRAMUSCULAR | Status: AC
  Administered 2020-06-13: 50 mg via INTRAVENOUS

## 2020-06-13 MED ORDER — METHYLPREDNISOLONE SODIUM SUCC 125 MG IJ SOLR
INTRAMUSCULAR | Status: AC
  Filled 2020-06-13: qty 2

## 2020-06-13 MED ORDER — FAMOTIDINE IN NACL 20-0.9 MG/50ML-% IV SOLN
20.0000 mg | Freq: Once | INTRAVENOUS | Status: AC
  Administered 2020-06-13: 20 mg via INTRAVENOUS

## 2020-06-13 MED ORDER — SODIUM CHLORIDE 0.9% FLUSH
10.0000 mL | Freq: Once | INTRAVENOUS | Status: AC
  Administered 2020-06-13: 10 mL
  Filled 2020-06-13: qty 10

## 2020-06-13 MED ORDER — DIPHENHYDRAMINE HCL 50 MG/ML IJ SOLN
INTRAMUSCULAR | Status: AC
  Filled 2020-06-13: qty 1

## 2020-06-13 MED ORDER — SODIUM CHLORIDE 0.9 % IV SOLN
80.0000 mg/m2 | Freq: Once | INTRAVENOUS | Status: AC
  Administered 2020-06-13: 132 mg via INTRAVENOUS
  Filled 2020-06-13: qty 22

## 2020-06-13 MED ORDER — PALONOSETRON HCL INJECTION 0.25 MG/5ML
0.2500 mg | Freq: Once | INTRAVENOUS | Status: AC
  Administered 2020-06-13: 0.25 mg via INTRAVENOUS

## 2020-06-13 MED ORDER — SODIUM CHLORIDE 0.9 % IV SOLN
185.2000 mg | Freq: Once | INTRAVENOUS | Status: AC
  Administered 2020-06-13: 190 mg via INTRAVENOUS
  Filled 2020-06-13: qty 19

## 2020-06-13 MED ORDER — SODIUM CHLORIDE 0.9 % IV SOLN
150.0000 mg | Freq: Once | INTRAVENOUS | Status: AC
  Administered 2020-06-13: 150 mg via INTRAVENOUS
  Filled 2020-06-13: qty 5
  Filled 2020-06-13: qty 150

## 2020-06-13 MED ORDER — FAMOTIDINE IN NACL 20-0.9 MG/50ML-% IV SOLN
INTRAVENOUS | Status: AC
  Filled 2020-06-13: qty 50

## 2020-06-13 NOTE — Telephone Encounter (Signed)
Scheduled per los. Gave avs and calendar  

## 2020-06-13 NOTE — Patient Instructions (Signed)
   [facility] Discharge Instructions for Patients Receiving Chemotherapy  Today you received the following chemotherapy agents Taxol and Carboplatin   To help prevent nausea and vomiting after your treatment, we encourage you to take your nausea medication as directed.    If you develop nausea and vomiting that is not controlled by your nausea medication, call the clinic.   BELOW ARE SYMPTOMS THAT SHOULD BE REPORTED IMMEDIATELY:  *FEVER GREATER THAN 100.5 F  *CHILLS WITH OR WITHOUT FEVER  NAUSEA AND VOMITING THAT IS NOT CONTROLLED WITH YOUR NAUSEA MEDICATION  *UNUSUAL SHORTNESS OF BREATH  *UNUSUAL BRUISING OR BLEEDING  TENDERNESS IN MOUTH AND THROAT WITH OR WITHOUT PRESENCE OF ULCERS  *URINARY PROBLEMS  *BOWEL PROBLEMS  UNUSUAL RASH Items with * indicate a potential emergency and should be followed up as soon as possible.  Feel free to call the clinic should you have any questions or concerns. The clinic phone number is [phone].  Please show the CHEMO ALERT CARD at check-in to the Emergency Department and triage nurse.   

## 2020-06-13 NOTE — Progress Notes (Signed)
Nutrition follow-up completed with patient during infusion. 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 Burr Medico. Weight is stable at 142.4 pounds on August 12. Labs were reviewed. Reports nausea and taste have improved. Reports she is eating a little better.  She has been making smoothies with a protein powder which provides an additional 27 g of protein. Patient still does not tolerate meats or dairy very well.  Nutrition diagnosis: Inadequate oral intake improved.  Intervention: Encourage patient to choose small frequent meals and snacks and be sure she has protein at every meal and snack. Provided additional fact sheet on ways to add calories and protein. Provided support and encouragement. Encouraged weight maintenance.  Monitoring, evaluation, goals: Patient will tolerate adequate calories and protein for weight maintenance.  Next visit: Friday, September 10 during infusion.  Patient has my contact information if she has questions before next visit.  Please consult RD if patient has nutrition needs identified sooner.  **Disclaimer: This note was dictated with voice recognition software. Similar sounding words can inadvertently be transcribed and this note may contain transcription errors which may not have been corrected upon publication of note.**

## 2020-06-13 NOTE — Patient Instructions (Signed)

## 2020-06-20 ENCOUNTER — Encounter: Payer: Self-pay | Admitting: *Deleted

## 2020-06-20 NOTE — Progress Notes (Signed)
[place]   [phone] [phone]   Clinic Follow up Note   Patient Care Team: [name], MD as PCP - General (Infectious Diseases) [name], MD as PCP - Infectious Diseases (Infectious Diseases) [name], MD as Consulting Physician (General Surgery) [name], MD as Consulting Physician (Hematology) [name], MD as Consulting Physician (Radiation Oncology) [name], RN as Oncology Nurse Navigator [name], RN as Oncology Nurse Navigator  Date of Service:  06/21/2020  CHIEF COMPLAINT: F/u of left breast cancer  SUMMARY OF ONCOLOGIC HISTORY: Oncology History Overview Note  Cancer Staging Malignant neoplasm of upper-outer quadrant of left breast in female, estrogen receptor positive ([place]) Staging form: Breast, AJCC 8th Edition - Clinical stage from 03/01/2020: Stage IV (cT4, cN0, pM1, G3, ER+, PR-, HER2-) - Signed by [name], MD on 03/06/2020    Malignant neoplasm of upper-outer quadrant of left breast in female, estrogen receptor positive ([place])  02/28/2020 Mammogram   Diagnostic Mammogram 02/28/20  IMPRESSION The 3.9 cm ill defined mass in the left breast posterior depth central 4.3cm to the nipple seen on the mediolateral oblique view onlt with associated difssue skin thickening is highly suspicious for malignancy.    03/01/2020 Cancer Staging   Staging form: Breast, AJCC 8th Edition - Clinical stage from 03/01/2020: Stage IV (cT4, cN0, pM1, G3, ER+, PR-, HER2-) - Signed by [name], MD on 03/06/2020   03/01/2020 Initial Biopsy   Diagnosis 03/01/20  1. Breast, left, needle core biopsy, 9 o'clock, 3-4cmfn - INVASIVE DUCTAL CARCINOMA. SEE NOTE 2. Lymph node, needle/core biopsy, right axilla - INVASIVE DUCTAL CARCINOMA. SEE NOTE Diagnosis Note 1. Invasive carcinoma measures 1.5 cm in greatest linear dimension and appears grade 3. Dr. [name] reviewed the case and concurs with the diagnosis. A breast prognostic  profile (ER, PR, Ki-67 and HER2) is pending and will be reported in an addendum. Dr. [name] was notified on 03/04/2020. 2. Carcinoma measures 0.6 cm in greatest linear dimension and appears grade 3. Lymphoid tissue is not identified - this may represent a completely replaced lymph node. A breast prognostic profile (ER, PR, Ki-67 and HER2) is pending and will be reported in an addendum. Dr. [name] reviewed the case and concurs with the diagnosis. Dr. [name] was notified on 03/04/2020.   03/01/2020 Receptors her2   1. PROGNOSTIC INDICATORS Results: IMMUNOHISTOCHEMICAL AND MORPHOMETRIC ANALYSIS PERFORMED MANUALLY The tumor cells are NEGATIVE for Her2 (0). Estrogen Receptor: 20%, POSITIVE, WEAK STAINING INTENSITY Progesterone Receptor: 0%, NEGATIVE Proliferation Marker Ki67: 40% COMMENT: The negative hormone receptor study(ies) in this case has an internal positive control.    2. PROGNOSTIC INDICATORS Results: IMMUNOHISTOCHEMICAL AND MORPHOMETRIC ANALYSIS PERFORMED MANUALLY The tumor cells are NEGATIVE for Her2 (1+). Estrogen Receptor: 10%, POSITIVE, WEAK STAINING INTENSITY Progesterone Receptor: 0%, NEGATIVE Proliferation Marker Ki67: 40% COMMENT: The negative hormone receptor study(ies) in this case has no internal positive control.   03/05/2020 Initial Diagnosis   Malignant neoplasm of upper-outer quadrant of left breast in female, estrogen receptor positive ([place])   03/18/2020 Breast MRI   IMPRESSION: 1. 7 x 9 x 6 cm area of suspicious non masslike enhancement within the central/UPPER RIGHT breast with anterior skin thickening. Given biopsy-proven metastatic RIGHT axillary lymph node, tissue sampling of the anterior and posterior aspects of this non masslike RIGHT breast enhancement is recommended to exclude malignancy. 2. 7 x 6 x 5 cm diffuse masslike and nonmasslike enhancement within the majority of the remaining LEFT breast with diffuse LEFT breast  skin thickening, compatible  with malignancy. Abnormal enhancement is noted along the anterior aspect of the LEFT pectoralis muscle. 3. Three abnormal RIGHT axillary lymph nodes, 1 which is biopsy proven to be metastatic disease. No abnormal LEFT axillary lymph nodes identified.   03/20/2020 Echocardiogram   Baseline ECHO  IMPRESSIONS     1. The patient was reported to be in pain during the study and not able  to participate in this study. All that can be said is that the LV and RV  function are grossly normal. Would recommend to repeat this study when/if  the patient is able to tolerate  the exam.   2. Left ventricular ejection fraction, by estimation, is 60 to 65%. The  left ventricle has normal function. Left ventricular endocardial border  not optimally defined to evaluate regional wall motion. Left ventricular  diastolic function could not be  evaluated.   3. Right ventricular systolic function is normal. The right ventricular  size is normal.   4. The mitral valve was not well visualized. No evidence of mitral valve  regurgitation.   5. The aortic valve was not well visualized. Aortic valve regurgitation  is not visualized.   6. The inferior vena cava is normal in size with greater than 50%  respiratory variability, suggesting right atrial pressure of 3 mmHg.    03/20/2020 Imaging   CT CAP W contrast  IMPRESSION: 1. Small right axillary/subpectoral lymph nodes. Difficult to exclude metastatic disease. 2. Subtle 6 mm low-attenuation lesion in the dome of the liver, not well seen previously. Lesion is too small to characterize. Further evaluation could be performed with MR abdomen without and with contrast, as clinically indicated. 3. Hepatic steatosis. 4. Aortic atherosclerosis (ICD10-I70.0). Coronary artery calcification.     03/20/2020 Imaging   Whole body bone scan  IMPRESSION: 1. Increased activity noted over the right breast, possibly related to the patient's right breast cancer.   2.  Punctate area of increased activity over the left maxillary region, most likely related to sinus disease or dental disease. Increased activity noted over the left mandible also possibly related to dental disease. No other focal bony abnormalities to suggest metastatic disease identified.   03/22/2020 Procedure   PAC placement by Dr [name]    03/22/2020 -  Chemotherapy   AC q2weeks for 4 cycles 03/22/20-05/03/20 followed by weekly Taxol and Carboplatin q3weeks for 12 weeks starting 05/17/20.    03/22/2020 Pathology Results   FINAL MICROSCOPIC DIAGNOSIS:   A. BREAST, RIGHT, PUNCH BIOPSY:  - Carcinoma involving dermal lymphatics.  - See comment.   B. BREAST, LEFT, PUNCH BIOPSY:  - Carcinoma involving dermal lymphatics.  - See comment.   COMMENT:  The morphology is compatible with ductal breast carcinoma.    ADDENDUM:   PROGNOSTIC INDICATOR RESULTS:   Immunohistochemical and morphometric analysis performed manually   The tumor cells are NEGATIVE for Her2 (0%).   Estrogen Receptor:       NEGATIVE, 0%  Progesterone Receptor:   NEGATIVE, 0%       CURRENT THERAPY:  AC q2weeks for 4 cycles 03/22/20-05/03/20 followed by weekly Taxoland Carboplatin q3weeksfor 12 weeks starting 05/17/20.Week 2 Taxol reduced to half dose and CT was dose reduced starting with week 4 due to thrombocytopenia.Given pancytopenia, will change Carboplatin to 50% dose weekly with Taxol 41m/m2 starting with week 7 (06/13/20). Will hold Carboplatin as needed based on labs   INTERVAL HISTORY:  Victoria PIATTis here for a follow up and treatment. She  presents to the clinic alone. She notes she is doing well. She notes her dose reduced CT last week was much more tolerate than full dose. She denies significant fatigue and energy was average. She notes she is interested in COVID booster vaccine here in our clinic.     REVIEW OF SYSTEMS:   Constitutional: Denies fevers, chills or abnormal weight loss Eyes: Denies  blurriness of vision Ears, nose, mouth, throat, and face: Denies mucositis or sore throat Respiratory: Denies cough, dyspnea or wheezes Cardiovascular: Denies palpitation, chest discomfort or lower extremity swelling Gastrointestinal:  Denies nausea, heartburn or change in bowel habits Skin: Denies abnormal skin rashes Lymphatics: Denies new lymphadenopathy or easy bruising Neurological:Denies numbness, tingling or new weaknesses Behavioral/Psych: Mood is stable, no new changes  All other systems were reviewed with the patient and are negative.  MEDICAL HISTORY:  Past Medical History:  Diagnosis Date  . Breast cancer ([place]) dx'd 1993   left  . History of left breast cancer   . HIV infection ([place])   . Hypertension     SURGICAL HISTORY: Past Surgical History:  Procedure Laterality Date  . BREAST BIOPSY Bilateral 03/22/2020   Procedure: BILATERAL BREAST PUNCH BIOPSIES;  Surgeon: [name], MD;  Location: [facility];  Service: General;  Laterality: Bilateral;  . BREAST LUMPECTOMY    . CESAREAN SECTION    . left breast lumpectomy  1993  . PORTACATH PLACEMENT Right 03/22/2020   Procedure: INSERTION PORT-A-CATH WITH ULTRASOUND GUIDANCE;  Surgeon: [name], MD;  Location: [facility];  Service: General;  Laterality: Right;    I have reviewed the social history and family history with the patient and they are unchanged from previous note.  ALLERGIES:  is allergic to codeine, other, grapefruit bioflavonoid complex, pomegranate [punica], and shellfish allergy.  MEDICATIONS:  Current Outpatient Medications  Medication Sig Dispense Refill  . acetaminophen (TYLENOL) 500 MG tablet Take 1,000 mg by mouth every 6 (six) hours as needed for moderate pain.    Marland Kitchen emtricitabine-rilpivir-tenofovir AF (ODEFSEY) 200-25-25 MG TABS tablet Take 1 tablet by mouth daily with breakfast. 30 tablet 11  . hydrochlorothiazide (HYDRODIURIL) 25 MG tablet Take 1 tablet (25 mg total) by mouth daily. 30 tablet 11  .  lidocaine-prilocaine (EMLA) cream Apply to affected area once 30 g 3  . loratadine (CLARITIN) 10 MG tablet Take 10 mg by mouth daily. Takes 3-4 days before and after treatment    . ondansetron (ZOFRAN) 8 MG tablet Take 1 tablet (8 mg total) by mouth 2 (two) times daily as needed. Start on the third day after chemotherapy. 30 tablet 2  . oxyCODONE (OXY IR/ROXICODONE) 5 MG immediate release tablet Take 1 tablet (5 mg total) by mouth every 6 (six) hours as needed for severe pain. 15 tablet 0  . potassium chloride SA (KLOR-CON) 20 MEQ tablet Take 1 tablet (20 mEq total) by mouth 2 (two) times daily. Take 1 tab twice daily for 3 days then 1 tab once daily 60 tablet 1  . prochlorperazine (COMPAZINE) 10 MG tablet Take 1 tablet (10 mg total) by mouth every 6 (six) hours as needed (Nausea or vomiting). 30 tablet 2   No current facility-administered medications for this visit.    PHYSICAL EXAMINATION: ECOG PERFORMANCE STATUS: 1 - Symptomatic but completely ambulatory  Vitals:   06/21/20 0848  BP: 139/90  Pulse: 88  Resp: 18  Temp: 98.1 F (36.7 C)  SpO2: 100%   Filed Weights   06/21/20 0848  Weight: 141 lb  3.2 oz (64 kg)    Due to COVID19 we will limit examination to appearance. Patient had no complaints.  GENERAL:alert, no distress and comfortable SKIN: skin color normal, no rashes or significant lesions EYES: normal, Conjunctiva are pink and non-injected, sclera clear  NEURO: alert & oriented x 3 with fluent speech   LABORATORY DATA:  I have reviewed the data as listed CBC Latest Ref Rng & Units 06/21/2020 06/13/2020 06/07/2020  WBC 4.0 - 10.5 K/uL 2.1(L) 2.5(L) 2.8(L)  Hemoglobin 12.0 - 15.0 g/dL 10.2(L) 10.4(L) 10.2(L)  Hematocrit 36 - 46 % 29.6(L) 30.8(L) 29.8(L)  Platelets 150 - 400 K/uL 214 110(L) 51(L)     CMP Latest Ref Rng & Units 06/21/2020 06/13/2020 06/07/2020  Glucose 70 - 99 mg/dL 96 98 98  BUN 8 - 23 mg/dL _0 Creatinine 0.44 - 1.00 mg/dL 0.81 0.87 0.85  Sodium 135  - 145 mmol/L 137 139 136  Potassium 3.5 - 5.1 mmol/L 4.1 3.7 3.5  Chloride 98 - 111 mmol/L 103 105 103  CO2 22 - 32 mmol/L _1 Calcium 8.9 - 10.3 mg/dL 9.7 9.8 9.6  Total Protein 6.5 - 8.1 g/dL 7.1 7.1 7.0  Total Bilirubin 0.3 - 1.2 mg/dL 0.2(L) 0.2(L) 0.2(L)  Alkaline Phos 38 - 126 U/L 169(H) 173(H) 168(H)  AST 15 - 41 U/L _2 ALT 0 - 44 U/L _3 RADIOGRAPHIC STUDIES: I have personally reviewed the radiological images as listed and agreed with the findings in the report. No results found.   ASSESSMENT & PLAN:  LUCIANNE SMESTAD is a 64 y.o. female with    1.Left breast cancer,cT4N0M1, possible stageIVwithinflammatory to skin of B/l breast (triple negative) andright breat cancer in [facility] withaxillary node metastasis, ERweakly+,PR/HER2-, Azucena Kuba -She was recently diagnosed with left breast cancer in 02/2020.Her Mammogram showed an at least 4cm mass of left breast. Based on initial exam sheprobably hasskin involvementand her entire left breast is occupied by cancer. Her mammogram also showed right LN enlargement. 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 It is not definitive if her right LN are related to her left breast cancer, or if she hasasecond right breast cancer -Her breast MRIfrom 5/17/21showedleft known breast cancer, she also has a large non-mass-like enhancement in the central upper right breast, highly suspicious for malignancy. Dr. [name] did punch skin biopsy of both right and left breaston 03/22/20, whichallcame back positive for carcinomawith ER/PR/HER2 negative markers.We discussed the option of right breast biopsy,versus right mastectomy. Patient would like to proceed with bilateral mastectomy.No need for right breast mass biopsy in this case. -Staging scans were negative for distant metastasis -Istarted her on neoadjuvantchemotherapy as first-line treatmentwithAdriamycin and  Cytoxanwhich she completed 4cycles5/21/21-05/03/20.She then proceeded with weekly Taxoland carboplatinevery 3 weeksfor 12 weeksstarting 05/17/20. Given pancytopenia, will change Carboplatin to 50% dose weekly with Taxol 109m/m2 starting with week 7 (06/13/20). Will hold Carboplatin based on labs that week.  -She was able to tolerate lower dose CT much better. Labs reviewed, WBC 2.1, Hg 10.2, ANC 1, platelets normal. Will proceed with week 5 Taxol today at same dose without Carboplatin.  -F/u next week, plan to add carbo back from next week if AAltamontrecovers well, and do weekly with AUC 1  -She plans to proceed with COVID19 booster with our clinic. If she practices low exposure she can wait until she is done with chemo to have  booster.   2. Thrombocytopenia and neutropenia  -Secondary to chemo -Thrombocytopenia resolved today -We will reduce her carboplatin dose, do not plan to give G-CSF since neutropenia resolved with chemo dose reduction   3. Anemia, secondary to chemo -Will give blood transfusion for Hg <7.  -She is currently on prenatal vitamin. -Her Hgdid further decrease to 7.6on7/23/21. She was treated with blood transfusion on 05/27/20 -Anemia responded to blood transfusion. Her anemia has been mild and stable in 10 range lately on treatment.   4. Weight Loss, Low appetite -Her weight has been trending down.She notes she has been fine with lose weight. She has lost 10 poundsover amonth.  -Iencouraged her to focuson maintaining weight instead of losing weight while on chemo so she is strong enough to continue. -Due to taste change she has not been consuming much carbohydratesand due to diet change she eats less fatty foods.She had increased proteinandvegetablesin her diet. I also encouraged her to increase calorie intake. -She has been able to gain weight with lactacid ice cream.  -She continues to eat and drink adequately and maintain her weight. She will continue to  f/u with Dietician.  5. H/o Left breast cancer, Dx in 1993,Genetic Testinghas not been done yet.  6. Comorbidities: HIV(+)Dx 1993, HTN -Her HIV has been undetectable and well controlled. She is currently being seen by ID Dr Graylon Good -BP has been normal lately and given her lower potassium,we have heldHCTZsince7/30/21.  7.Mild transaminitis  -Unclear etiology, presents before chemotherapy.Her6/16/21 MRIabdomenwas benign. -Her LFTshave resolved (05/24/20)with mildly elevated Alk Phos.  8.Hypokalemia  -Secondary to chemo  -Due to improved potassium, I advised her to stop HCTZ and reduce potassium to once daily (05/31/20)   PLAN: -Labs reviewed and adequate to proceed with Taxol alone today at same dose, will hold Carboplatin  -Lab, flush, F/uand chemo next week.  If neutropenia resolves, plan to add weekly carboplatin with AUC 1  No problem-specific Assessment & Plan notes found for this encounter.   No orders of the defined types were placed in this encounter.  All questions were answered. The patient knows to call the clinic with any problems, questions or concerns. No barriers to learning was detected. The total time spent in the appointment was 30 minutes.     [name], MD 06/21/2020   I, Joslyn Devon, am acting as scribe for [name], MD.   I have reviewed the above documentation for accuracy and completeness, and I agree with the above.

## 2020-06-21 ENCOUNTER — Inpatient Hospital Stay (HOSPITAL_BASED_OUTPATIENT_CLINIC_OR_DEPARTMENT_OTHER): Payer: BC Managed Care – PPO | Admitting: Hematology

## 2020-06-21 ENCOUNTER — Other Ambulatory Visit: Payer: Self-pay

## 2020-06-21 ENCOUNTER — Inpatient Hospital Stay: Payer: BC Managed Care – PPO

## 2020-06-21 ENCOUNTER — Encounter: Payer: Self-pay | Admitting: Hematology

## 2020-06-21 VITALS — BP 139/90 | HR 88 | Temp 98.1°F | Resp 18 | Ht 61.0 in | Wt 141.2 lb

## 2020-06-21 DIAGNOSIS — C50412 Malignant neoplasm of upper-outer quadrant of left female breast: Secondary | ICD-10-CM

## 2020-06-21 DIAGNOSIS — Z5111 Encounter for antineoplastic chemotherapy: Secondary | ICD-10-CM | POA: Diagnosis not present

## 2020-06-21 DIAGNOSIS — Z170 Estrogen receptor positive status [ER+]: Secondary | ICD-10-CM

## 2020-06-21 DIAGNOSIS — D696 Thrombocytopenia, unspecified: Secondary | ICD-10-CM

## 2020-06-21 LAB — CMP (CANCER CENTER ONLY)
ALT: 30 U/L (ref 0–44)
AST: 28 U/L (ref 15–41)
Albumin: 3.3 g/dL — ABNORMAL LOW (ref 3.5–5.0)
Alkaline Phosphatase: 169 U/L — ABNORMAL HIGH (ref 38–126)
Anion gap: 10 (ref 5–15)
BUN: 13 mg/dL (ref 8–23)
CO2: 24 mmol/L (ref 22–32)
Calcium: 9.7 mg/dL (ref 8.9–10.3)
Chloride: 103 mmol/L (ref 98–111)
Creatinine: 0.81 mg/dL (ref 0.44–1.00)
GFR, Est AFR Am: >60 mL/min (ref >60)
GFR, Estimated: >60 mL/min (ref >60)
Glucose, Bld: 96 mg/dL (ref 70–99)
Potassium: 4.1 mmol/L (ref 3.5–5.1)
Sodium: 137 mmol/L (ref 135–145)
Total Bilirubin: 0.2 mg/dL — ABNORMAL LOW (ref 0.3–1.2)
Total Protein: 7.1 g/dL (ref 6.5–8.1)

## 2020-06-21 LAB — CBC WITH DIFFERENTIAL (CANCER CENTER ONLY)
Abs Immature Granulocytes: 0.01 10*3/uL (ref 0.00–0.07)
Basophils Absolute: 0 10*3/uL (ref 0.0–0.1)
Basophils Relative: 1 %
Eosinophils Absolute: 0 10*3/uL (ref 0.0–0.5)
Eosinophils Relative: 1 %
HCT: 29.6 % — ABNORMAL LOW (ref 36.0–46.0)
Hemoglobin: 10.2 g/dL — ABNORMAL LOW (ref 12.0–15.0)
Immature Granulocytes: 1 %
Lymphocytes Relative: 40 %
Lymphs Abs: 0.8 10*3/uL (ref 0.7–4.0)
MCH: 30.2 pg (ref 26.0–34.0)
MCHC: 34.5 g/dL (ref 30.0–36.0)
MCV: 87.6 fL (ref 80.0–100.0)
Monocytes Absolute: 0.2 10*3/uL (ref 0.1–1.0)
Monocytes Relative: 9 %
Neutro Abs: 1 10*3/uL — ABNORMAL LOW (ref 1.7–7.7)
Neutrophils Relative %: 48 %
Platelet Count: 214 10*3/uL (ref 150–400)
RBC: 3.38 MIL/uL — ABNORMAL LOW (ref 3.87–5.11)
RDW: 18.1 % — ABNORMAL HIGH (ref 11.5–15.5)
WBC Count: 2.1 10*3/uL — ABNORMAL LOW (ref 4.0–10.5)
nRBC: 0 % (ref 0.0–0.2)

## 2020-06-21 MED ORDER — DIPHENHYDRAMINE HCL 50 MG/ML IJ SOLN
50.0000 mg | Freq: Once | INTRAMUSCULAR | Status: AC
  Administered 2020-06-21: 50 mg via INTRAVENOUS

## 2020-06-21 MED ORDER — FAMOTIDINE IN NACL 20-0.9 MG/50ML-% IV SOLN
INTRAVENOUS | Status: AC
  Filled 2020-06-21: qty 50

## 2020-06-21 MED ORDER — SODIUM CHLORIDE 0.9 % IV SOLN
80.0000 mg/m2 | Freq: Once | INTRAVENOUS | Status: AC
  Administered 2020-06-21: 132 mg via INTRAVENOUS
  Filled 2020-06-21: qty 22

## 2020-06-21 MED ORDER — METHYLPREDNISOLONE SODIUM SUCC 125 MG IJ SOLR
100.0000 mg | Freq: Once | INTRAMUSCULAR | Status: AC
  Administered 2020-06-21: 100 mg via INTRAVENOUS

## 2020-06-21 MED ORDER — FAMOTIDINE IN NACL 20-0.9 MG/50ML-% IV SOLN
20.0000 mg | Freq: Once | INTRAVENOUS | Status: AC
  Administered 2020-06-21: 20 mg via INTRAVENOUS

## 2020-06-21 MED ORDER — SODIUM CHLORIDE 0.9% FLUSH
10.0000 mL | INTRAVENOUS | Status: DC | PRN
  Administered 2020-06-21: 10 mL
  Filled 2020-06-21: qty 10

## 2020-06-21 MED ORDER — METHYLPREDNISOLONE SODIUM SUCC 125 MG IJ SOLR
INTRAMUSCULAR | Status: AC
  Filled 2020-06-21: qty 2

## 2020-06-21 MED ORDER — SODIUM CHLORIDE 0.9 % IV SOLN
Freq: Once | INTRAVENOUS | Status: AC
  Filled 2020-06-21: qty 250

## 2020-06-21 MED ORDER — HEPARIN SOD (PORK) LOCK FLUSH 100 UNIT/ML IV SOLN
500.0000 [IU] | Freq: Once | INTRAVENOUS | Status: AC | PRN
  Administered 2020-06-21: 500 [IU]
  Filled 2020-06-21: qty 5

## 2020-06-21 MED ORDER — DIPHENHYDRAMINE HCL 50 MG/ML IJ SOLN
INTRAMUSCULAR | Status: AC
  Filled 2020-06-21: qty 1

## 2020-06-21 NOTE — Patient Instructions (Signed)
[facility] Discharge Instructions for Patients Receiving Chemotherapy  Today you received the following chemotherapy agents taxol  To help prevent nausea and vomiting after your treatment, we encourage you to take your nausea medication as directed   If you develop nausea and vomiting that is not controlled by your nausea medication, call the clinic.   BELOW ARE SYMPTOMS THAT SHOULD BE REPORTED IMMEDIATELY:  *FEVER GREATER THAN 100.5 F  *CHILLS WITH OR WITHOUT FEVER  NAUSEA AND VOMITING THAT IS NOT CONTROLLED WITH YOUR NAUSEA MEDICATION  *UNUSUAL SHORTNESS OF BREATH  *UNUSUAL BRUISING OR BLEEDING  TENDERNESS IN MOUTH AND THROAT WITH OR WITHOUT PRESENCE OF ULCERS  *URINARY PROBLEMS  *BOWEL PROBLEMS  UNUSUAL RASH Items with * indicate a potential emergency and should be followed up as soon as possible.  Feel free to call the clinic should you have any questions or concerns. The clinic phone number is [phone].  Please show the CHEMO ALERT CARD at check-in to the Emergency Department and triage nurse.   

## 2020-06-21 NOTE — Progress Notes (Signed)
Per Dr. Burr Medico, Victoria Coffey to treat with ANC 1.0.

## 2020-06-27 NOTE — Progress Notes (Signed)
[place] OFFICE PROGRESS NOTE  [name], MD [place] [address] [place] [address]  DIAGNOSIS: F/u of left breast cancer  Oncology History Overview Note  Cancer Staging Malignant neoplasm of upper-outer quadrant of left breast in female, estrogen receptor positive ([place]) Staging form: Breast, AJCC 8th Edition - Clinical stage from 03/01/2020: Stage IV (cT4, cN0, pM1, G3, ER+, PR-, HER2-) - Signed by [name], MD on 03/06/2020    Malignant neoplasm of upper-outer quadrant of left breast in female, estrogen receptor positive ([place])  02/28/2020 Mammogram   Diagnostic Mammogram 02/28/20  IMPRESSION The 3.9 cm ill defined mass in the left breast posterior depth central 4.3cm to the nipple seen on the mediolateral oblique view onlt with associated difssue skin thickening is highly suspicious for malignancy.    03/01/2020 Cancer Staging   Staging form: Breast, AJCC 8th Edition - Clinical stage from 03/01/2020: Stage IV (cT4, cN0, pM1, G3, ER+, PR-, HER2-) - Signed by [name], MD on 03/06/2020   03/01/2020 Initial Biopsy   Diagnosis 03/01/20  1. Breast, left, needle core biopsy, 9 o'clock, 3-4cmfn - INVASIVE DUCTAL CARCINOMA. SEE NOTE 2. Lymph node, needle/core biopsy, right axilla - INVASIVE DUCTAL CARCINOMA. SEE NOTE Diagnosis Note 1. Invasive carcinoma measures 1.5 cm in greatest linear dimension and appears grade 3. Dr. [name] reviewed the case and concurs with the diagnosis. A breast prognostic profile (ER, PR, Ki-67 and HER2) is pending and will be reported in an addendum. Dr. [name] was notified on 03/04/2020. 2. Carcinoma measures 0.6 cm in greatest linear dimension and appears grade 3. Lymphoid tissue is not identified - this may represent a completely replaced lymph node. A breast prognostic profile (ER, PR, Ki-67 and HER2) is pending and will be reported in an addendum. Dr. [name] reviewed the case and concurs with the diagnosis. Dr. [name] was  notified on 03/04/2020.   03/01/2020 Receptors her2   1. PROGNOSTIC INDICATORS Results: IMMUNOHISTOCHEMICAL AND MORPHOMETRIC ANALYSIS PERFORMED MANUALLY The tumor cells are NEGATIVE for Her2 (0). Estrogen Receptor: 20%, POSITIVE, WEAK STAINING INTENSITY Progesterone Receptor: 0%, NEGATIVE Proliferation Marker Ki67: 40% COMMENT: The negative hormone receptor study(ies) in this case has an internal positive control.    2. PROGNOSTIC INDICATORS Results: IMMUNOHISTOCHEMICAL AND MORPHOMETRIC ANALYSIS PERFORMED MANUALLY The tumor cells are NEGATIVE for Her2 (1+). Estrogen Receptor: 10%, POSITIVE, WEAK STAINING INTENSITY Progesterone Receptor: 0%, NEGATIVE Proliferation Marker Ki67: 40% COMMENT: The negative hormone receptor study(ies) in this case has no internal positive control.   03/05/2020 Initial Diagnosis   Malignant neoplasm of upper-outer quadrant of left breast in female, estrogen receptor positive ([place])   03/18/2020 Breast MRI   IMPRESSION: 1. 7 x 9 x 6 cm area of suspicious non masslike enhancement within the central/UPPER RIGHT breast with anterior skin thickening. Given biopsy-proven metastatic RIGHT axillary lymph node, tissue sampling of the anterior and posterior aspects of this non masslike RIGHT breast enhancement is recommended to exclude malignancy. 2. 7 x 6 x 5 cm diffuse masslike and nonmasslike enhancement within the majority of the remaining LEFT breast with diffuse LEFT breast skin thickening, compatible with malignancy. Abnormal enhancement is noted along the anterior aspect of the LEFT pectoralis muscle. 3. Three abnormal RIGHT axillary lymph nodes, 1 which is biopsy proven to be metastatic disease. No abnormal LEFT axillary lymph nodes identified.   03/20/2020 Echocardiogram   Baseline ECHO  IMPRESSIONS     1. The patient was reported to be in pain during the study and not able  to participate in this study. All that can be said is that the LV and RV   function are grossly normal. Would recommend to repeat this study when/if  the patient is able to tolerate  the exam.   2. Left ventricular ejection fraction, by estimation, is 60 to 65%. The  left ventricle has normal function. Left ventricular endocardial border  not optimally defined to evaluate regional wall motion. Left ventricular  diastolic function could not be  evaluated.   3. Right ventricular systolic function is normal. The right ventricular  size is normal.   4. The mitral valve was not well visualized. No evidence of mitral valve  regurgitation.   5. The aortic valve was not well visualized. Aortic valve regurgitation  is not visualized.   6. The inferior vena cava is normal in size with greater than 50%  respiratory variability, suggesting right atrial pressure of 3 mmHg.    03/20/2020 Imaging   CT CAP W contrast  IMPRESSION: 1. Small right axillary/subpectoral lymph nodes. Difficult to exclude metastatic disease. 2. Subtle 6 mm low-attenuation lesion in the dome of the liver, not well seen previously. Lesion is too small to characterize. Further evaluation could be performed with MR abdomen without and with contrast, as clinically indicated. 3. Hepatic steatosis. 4. Aortic atherosclerosis (ICD10-I70.0). Coronary artery calcification.     03/20/2020 Imaging   Whole body bone scan  IMPRESSION: 1. Increased activity noted over the right breast, possibly related to the patient's right breast cancer.   2. Punctate area of increased activity over the left maxillary region, most likely related to sinus disease or dental disease. Increased activity noted over the left mandible also possibly related to dental disease. No other focal bony abnormalities to suggest metastatic disease identified.   03/22/2020 Procedure   PAC placement by Dr [name]    03/22/2020 -  Chemotherapy   AC q2weeks for 4 cycles 03/22/20-05/03/20 followed by weekly Taxol and Carboplatin q3weeks for  12 weeks starting 05/17/20.    03/22/2020 Pathology Results   FINAL MICROSCOPIC DIAGNOSIS:   A. BREAST, RIGHT, PUNCH BIOPSY:  - Carcinoma involving dermal lymphatics.  - See comment.   B. BREAST, LEFT, PUNCH BIOPSY:  - Carcinoma involving dermal lymphatics.  - See comment.   COMMENT:  The morphology is compatible with ductal breast carcinoma.    ADDENDUM:   PROGNOSTIC INDICATOR RESULTS:   Immunohistochemical and morphometric analysis performed manually   The tumor cells are NEGATIVE for Her2 (0%).   Estrogen Receptor:       NEGATIVE, 0%  Progesterone Receptor:   NEGATIVE, 0%      CURRENT THERAPY: AC q2weeks for 4 cycles 03/22/20-05/03/20 followed by weekly Taxoland Carboplatin q3weeksfor 12 weeks starting 05/17/20.Week 2 Taxol reduced to half dose and CT was dose reduced starting with week 4 due to thrombocytopenia.Given pancytopenia, will change Carboplatin to 50% dose weekly with Taxol $RemoveBe'80mg'cdaaoRfWl$ /m2 starting with week 7 (06/13/20). Will hold Carboplatin as needed based on labs   INTERVAL HISTORY: RODNESHA ELIE 64 y.o. female returns to the clinic today for a follow up visit. The patient is feeling well today today without any concerning complaints. She has been exercising/staying active by going up and down the stairs. The patient tolerated her last treatment well without any concerning complaints. She received taxol only due to neutropenia. She denies any fevers, chills, weight loss, or night sweats. She does report that foods high in carbs taste like plaster. Denies sore throat, skin infections, dysuria, cough, or  shortness of breath. She denies nausea, vomiting, diarrhea, or constipation. Denies peripheral neuropathy. Denies worsening fatigue. She reports her breast lump is stable and she believes her skin darkening is getting lighter over the left breast. She is here for evaluation before starting week 6 of 12 of carbo/taxol.   MEDICAL HISTORY: Past Medical History:  Diagnosis Date   . Breast cancer ([place]) dx'd 1993   left  . History of left breast cancer   . HIV infection ([place])   . Hypertension     ALLERGIES:  is allergic to codeine, other, grapefruit bioflavonoid complex, pomegranate [punica], and shellfish allergy.  MEDICATIONS:  Current Outpatient Medications  Medication Sig Dispense Refill  . emtricitabine-rilpivir-tenofovir AF (ODEFSEY) 200-25-25 MG TABS tablet Take 1 tablet by mouth daily with breakfast. 30 tablet 11  . hydrochlorothiazide (HYDRODIURIL) 25 MG tablet Take 1 tablet (25 mg total) by mouth daily. 30 tablet 11  . lidocaine-prilocaine (EMLA) cream Apply to affected area once 30 g 3  . loratadine (CLARITIN) 10 MG tablet Take 10 mg by mouth daily. Takes 3-4 days before and after treatment    . potassium chloride SA (KLOR-CON) 20 MEQ tablet Take 1 tablet (20 mEq total) by mouth 2 (two) times daily. Take 1 tab twice daily for 3 days then 1 tab once daily 60 tablet 1  . acetaminophen (TYLENOL) 500 MG tablet Take 1,000 mg by mouth every 6 (six) hours as needed for moderate pain. (Patient not taking: Reported on 06/28/2020)    . ondansetron (ZOFRAN) 8 MG tablet Take 1 tablet (8 mg total) by mouth 2 (two) times daily as needed. Start on the third day after chemotherapy. (Patient not taking: Reported on 06/28/2020) 30 tablet 2  . oxyCODONE (OXY IR/ROXICODONE) 5 MG immediate release tablet Take 1 tablet (5 mg total) by mouth every 6 (six) hours as needed for severe pain. (Patient not taking: Reported on 06/28/2020) 15 tablet 0  . prochlorperazine (COMPAZINE) 10 MG tablet Take 1 tablet (10 mg total) by mouth every 6 (six) hours as needed (Nausea or vomiting). (Patient not taking: Reported on 06/28/2020) 30 tablet 2   No current facility-administered medications for this visit.   Facility-Administered Medications Ordered in Other Visits  Medication Dose Route Frequency Provider Last Rate Last Admin  . 0.9 %  sodium chloride infusion   Intravenous Once [name], MD       . CARBOplatin (PARAPLATIN) 110 mg in sodium chloride 0.9 % 100 mL chemo infusion  110 mg Intravenous Once [name], MD      . diphenhydrAMINE (BENADRYL) injection 50 mg  50 mg Intravenous Once [name], MD      . famotidine (PEPCID) IVPB 20 mg premix  20 mg Intravenous Once [name], MD      . heparin lock flush 100 unit/mL  500 Units Intracatheter Once PRN [name], MD      . methylPREDNISolone sodium succinate (SOLU-MEDROL) 125 mg/2 mL injection 100 mg  100 mg Intravenous Once [name], MD      . PACLitaxel (TAXOL) 132 mg in sodium chloride 0.9 % 250 mL chemo infusion (</= $RemoveBefor'80mg'OojigUnBGBkI$ /m2)  80 mg/m2 (Order-Specific) Intravenous Once [name], MD      . sodium chloride flush (NS) 0.9 % injection 10 mL  10 mL Intracatheter PRN [name], MD        SURGICAL HISTORY:  Past Surgical History:  Procedure Laterality Date  . BREAST BIOPSY Bilateral 03/22/2020   Procedure: BILATERAL BREAST PUNCH BIOPSIES;  Surgeon: [name], MD;  Location: [facility];  Service: General;  Laterality: Bilateral;  . BREAST LUMPECTOMY    . CESAREAN SECTION    . left breast lumpectomy  1993  . PORTACATH PLACEMENT Right 03/22/2020   Procedure: INSERTION PORT-A-CATH WITH ULTRASOUND GUIDANCE;  Surgeon: [name], MD;  Location: [facility];  Service: General;  Laterality: Right;    REVIEW OF SYSTEMS:   Review of Systems  Constitutional: Negative for appetite change, chills, fatigue, fever and unexpected weight change.  HENT: Positive for taste changes. Negative for mouth sores, nosebleeds, sore throat and trouble swallowing.  Eyes: Negative for eye problems and icterus.  Respiratory: Negative for cough, hemoptysis, shortness of breath and wheezing.   Cardiovascular: Negative for chest pain and leg swelling.  Gastrointestinal: Negative for abdominal pain, constipation, diarrhea, nausea and vomiting.  Genitourinary: Negative for bladder incontinence, difficulty urinating, dysuria, frequency and hematuria.    Musculoskeletal: Negative for back pain, gait problem, neck pain and neck stiffness.  Skin: Negative for itching and rash. Positive for skin thickening and discoloration on left breast.  Neurological: Negative for dizziness, extremity weakness, gait problem, headaches, light-headedness and seizures.  Hematological: Negative for adenopathy. Does not bruise/bleed easily.  Psychiatric/Behavioral: Negative for confusion, depression and sleep disturbance. The patient is not nervous/anxious.     PHYSICAL EXAMINATION:  Blood pressure 132/83, pulse 90, temperature 97.6 F (36.4 C), resp. rate 17, weight 143 lb (64.9 kg), SpO2 100 %.  ECOG PERFORMANCE STATUS: 1 - Symptomatic but completely ambulatory  Physical Exam  Constitutional: Oriented to person, place, and time and well-developed, well-nourished, and in no distress.  HENT:  Head: Normocephalic and atraumatic.  Mouth/Throat: Oropharynx is clear and moist. No oropharyngeal exudate.  Eyes: Conjunctivae are normal. Right eye exhibits no discharge. Left eye exhibits no discharge. No scleral icterus.  Neck: Normal range of motion. Neck supple.  Cardiovascular: Normal rate, regular rhythm, normal heart sounds and intact distal pulses.  Pulmonary/Chest: Effort normal and breath sounds normal. No respiratory distress. No wheezes. No rales.  Abdominal: Soft. Bowel sounds are normal. Exhibits no distension and no mass. There is no tenderness.  Musculoskeletal: Normal range of motion. Exhibits no edema.  Lymphadenopathy:  No cervical adenopathy.  Neurological: Alert and oriented to person, place, and time. Exhibits normal muscle tone. Gait normal. Coordination normal.  Skin: Skin is warm and dry. No rash noted. Not diaphoretic. No erythema. No pallor.  Psychiatric: Mood, memory and judgment normal.  BREAST:(+) Skin thickening of her left breast with hyperpigmentation (+). ~11x8 cm. Right breast 6x4cm palpable mass in RUQ withmildskin erythema.  Right breast larger than left.   LABORATORY DATA: Lab Results  Component Value Date   WBC 2.7 (L) 06/28/2020   HGB 9.5 (L) 06/28/2020   HCT 28.5 (L) 06/28/2020   MCV 88.8 06/28/2020   PLT 243 06/28/2020      Chemistry      Component Value Date/Time   NA 136 06/28/2020 0839   K 4.2 06/28/2020 0839   CL 106 06/28/2020 0839   CO2 24 06/28/2020 0839   BUN 14 06/28/2020 0839   CREATININE 0.79 06/28/2020 0839   CREATININE 0.82 02/09/2020 0855      Component Value Date/Time   CALCIUM 9.6 06/28/2020 0839   ALKPHOS 157 (H) 06/28/2020 0839   AST 23 06/28/2020 0839   ALT 21 06/28/2020 0839   BILITOT <0.2 (L) 06/28/2020 0839       RADIOGRAPHIC STUDIES:  No results found.   ASSESSMENT/PLAN:  Delcie Roch  Jerilynn Mages Silvernail is a 65 y.o. female with    1.Left breast cancer,cT4N0M1, possible stageIVwithinflammatory to skin of B/l breast (triple negative) andright breat cancer in [facility] withaxillary node metastasis, ERweakly+,PR/HER2-, Azucena Kuba -She was recently diagnosed with left breast cancer in 02/2020.Her Mammogram showed an at least 4cm mass of left breast. Based on initial exam sheprobably hasskin involvementand her entire left breast is occupied by cancer. Her mammogram also showed right LN enlargement. 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 It is not definitive if her right LN are related to her left breast cancer, or if she hasasecond right breast cancer -Her breast MRIfrom 5/17/21showedleft known breast cancer, she also has a large non-mass-like enhancement in the central upper right breast, highly suspicious for malignancy. Dr. [name] did punch skin biopsy of both right and left breaston 03/22/20, whichallcame back positive for carcinomawith ER/PR/HER2 negative markers.We discussed the option of right breast biopsy,versus right mastectomy. Patient would like to proceed with bilateral mastectomy.No need for right  breast mass biopsy in this case. -Staging scans were negative for distant metastasis -Dr. Burr Medico started her on neoadjuvantchemotherapy as first-line treatmentwithAdriamycin and Cytoxanwhich she completed 4cycles5/21/21-05/03/20.She then proceeded with weekly Taxoland carboplatinevery 3 weeksfor 12 weeksstarting 05/17/20.Given pancytopenia, Dr. Burr Medico changed Carboplatin to 50% dose weekly with Taxol $RemoveBe'80mg'lWgtnBrCF$ /m2 starting with week 7 (06/13/20). Will hold Carboplatin based on labs that week.  -She was able to tolerate lower dose CT much better. Labs reviewed, WBC 2.7, Hg 9.5, ANC 1.3, platelets normal. Reviewed labs with Dr. Burr Medico who recommended that the patient proceed with week 6 Taxol today at same dose with Carboplatin for an AUC of 1.  -F/u next week, and will determine if she will receive [place] on a weekly basis based on lab work that day. If receiving carboplatin, will do weekly with AUC 1  -She plans to proceed with COVID19 booster with our clinic. If she practices low exposure she can wait until she is done with chemo to have booster.  -F/U in 1 week  2. Thrombocytopenia and neutropenia  -Secondary to chemo -Thrombocytopenia has resolved. (Platelet 243k today 06/28/20) -We will reduce her carboplatin dose, Dr. Burr Medico does not plan on giving G-CSF since neutropenia resolved with chemo dose reduction  -Neutropenia today with ANC of 1.3 (06/28/20)  3. Anemia, secondary to chemo -Will give blood transfusion for Hg <7.  -She is currently on prenatal vitamin. -Her Hgdid further decrease to 7.6on7/23/21. She was treated with blood transfusion on 05/27/20 -Anemia responded to blood transfusion.Her anemia has been mild and stable in 10 range lately on treatment.  4. Weight Loss, Low appetite -Her weight has been trending down.She notes she has been fine with lose weight. She has lost 10 poundsover amonth.  -Dr. Burr Medico previouslyencouraged her to focuson maintaining weight instead of  losing weight while on chemo so she is strong enough to continue. -Due to taste change she has not been consuming much carbohydratesand due to diet change she eats less fatty foods.She had increased proteinandvegetablesin her diet. Dr. Burr Medico also encouraged her to increase calorie intake. -She has been able to gain weight with lactacid ice cream. -She continues to eat and drink adequately and maintain her weight.She will continue to f/u with Dietician.  5. H/o Left breast cancer, Dx in 1993,Genetic Testinghas not been done yet.  6. Comorbidities: HIV(+)Dx 1993, HTN -Her HIV has been undetectable and well controlled. She is currently being seen by ID Dr Graylon Good -BP has been  normal lately and given her lower potassium,we have heldHCTZsince7/30/21.  7.Mild transaminitis  -Unclear etiology, presents before chemotherapy.Her6/16/21 MRIabdomenwas benign. -Her LFTshave resolved (05/24/20)with mildly elevated Alk Phos.  8.Hypokalemia  -Secondary to chemo -Due to improved potassium, Dr. Burr Medico advised her to stop HCTZ and reduce potassium to once daily(05/31/20)   PLAN: -Labs reviewed with Dr. Burr Medico who recommended that she proceed with Carboplatin for an AUC of 1 and taxol 80 mg/m2 today as scheduled. -Lab, flush, F/uand chemonext week.   No orders of the defined types were placed in this encounter.    Sherlynn Tourville L Miraj Truss, PA-C 06/28/20

## 2020-06-28 ENCOUNTER — Encounter: Payer: Self-pay | Admitting: *Deleted

## 2020-06-28 ENCOUNTER — Inpatient Hospital Stay: Payer: BC Managed Care – PPO

## 2020-06-28 ENCOUNTER — Other Ambulatory Visit: Payer: Self-pay

## 2020-06-28 ENCOUNTER — Inpatient Hospital Stay (HOSPITAL_BASED_OUTPATIENT_CLINIC_OR_DEPARTMENT_OTHER): Payer: BC Managed Care – PPO | Admitting: Physician Assistant

## 2020-06-28 VITALS — BP 132/83 | HR 90 | Temp 97.6°F | Resp 17 | Wt 143.0 lb

## 2020-06-28 DIAGNOSIS — Z170 Estrogen receptor positive status [ER+]: Secondary | ICD-10-CM

## 2020-06-28 DIAGNOSIS — Z5111 Encounter for antineoplastic chemotherapy: Secondary | ICD-10-CM | POA: Diagnosis not present

## 2020-06-28 DIAGNOSIS — D702 Other drug-induced agranulocytosis: Secondary | ICD-10-CM | POA: Diagnosis not present

## 2020-06-28 DIAGNOSIS — Z95828 Presence of other vascular implants and grafts: Secondary | ICD-10-CM

## 2020-06-28 DIAGNOSIS — C50412 Malignant neoplasm of upper-outer quadrant of left female breast: Secondary | ICD-10-CM

## 2020-06-28 LAB — CBC WITH DIFFERENTIAL (CANCER CENTER ONLY)
Abs Immature Granulocytes: 0.02 10*3/uL (ref 0.00–0.07)
Basophils Absolute: 0 10*3/uL (ref 0.0–0.1)
Basophils Relative: 0 %
Eosinophils Absolute: 0 10*3/uL (ref 0.0–0.5)
Eosinophils Relative: 0 %
HCT: 28.5 % — ABNORMAL LOW (ref 36.0–46.0)
Hemoglobin: 9.5 g/dL — ABNORMAL LOW (ref 12.0–15.0)
Immature Granulocytes: 1 %
Lymphocytes Relative: 37 %
Lymphs Abs: 1 10*3/uL (ref 0.7–4.0)
MCH: 29.6 pg (ref 26.0–34.0)
MCHC: 33.3 g/dL (ref 30.0–36.0)
MCV: 88.8 fL (ref 80.0–100.0)
Monocytes Absolute: 0.4 10*3/uL (ref 0.1–1.0)
Monocytes Relative: 13 %
Neutro Abs: 1.3 10*3/uL — ABNORMAL LOW (ref 1.7–7.7)
Neutrophils Relative %: 49 %
Platelet Count: 243 10*3/uL (ref 150–400)
RBC: 3.21 MIL/uL — ABNORMAL LOW (ref 3.87–5.11)
RDW: 19 % — ABNORMAL HIGH (ref 11.5–15.5)
WBC Count: 2.7 10*3/uL — ABNORMAL LOW (ref 4.0–10.5)
nRBC: 0 % (ref 0.0–0.2)

## 2020-06-28 LAB — CMP (CANCER CENTER ONLY)
ALT: 21 U/L (ref 0–44)
AST: 23 U/L (ref 15–41)
Albumin: 3.3 g/dL — ABNORMAL LOW (ref 3.5–5.0)
Alkaline Phosphatase: 157 U/L — ABNORMAL HIGH (ref 38–126)
Anion gap: 6 (ref 5–15)
BUN: 14 mg/dL (ref 8–23)
CO2: 24 mmol/L (ref 22–32)
Calcium: 9.6 mg/dL (ref 8.9–10.3)
Chloride: 106 mmol/L (ref 98–111)
Creatinine: 0.79 mg/dL (ref 0.44–1.00)
GFR, Est AFR Am: >60 mL/min (ref >60)
GFR, Estimated: >60 mL/min (ref >60)
Glucose, Bld: 98 mg/dL (ref 70–99)
Potassium: 4.2 mmol/L (ref 3.5–5.1)
Sodium: 136 mmol/L (ref 135–145)
Total Bilirubin: <0.2 mg/dL — ABNORMAL LOW (ref 0.3–1.2)
Total Protein: 7 g/dL (ref 6.5–8.1)

## 2020-06-28 MED ORDER — FAMOTIDINE IN NACL 20-0.9 MG/50ML-% IV SOLN
20.0000 mg | Freq: Once | INTRAVENOUS | Status: AC
  Administered 2020-06-28: 20 mg via INTRAVENOUS

## 2020-06-28 MED ORDER — PALONOSETRON HCL INJECTION 0.25 MG/5ML
INTRAVENOUS | Status: AC
  Filled 2020-06-28: qty 5

## 2020-06-28 MED ORDER — METHYLPREDNISOLONE SODIUM SUCC 125 MG IJ SOLR
INTRAMUSCULAR | Status: AC
  Filled 2020-06-28: qty 2

## 2020-06-28 MED ORDER — SODIUM CHLORIDE 0.9% FLUSH
10.0000 mL | INTRAVENOUS | Status: DC | PRN
  Administered 2020-06-28: 10 mL
  Filled 2020-06-28: qty 10

## 2020-06-28 MED ORDER — PALONOSETRON HCL INJECTION 0.25 MG/5ML
0.2500 mg | Freq: Once | INTRAVENOUS | Status: AC
  Administered 2020-06-28: 0.25 mg via INTRAVENOUS

## 2020-06-28 MED ORDER — SODIUM CHLORIDE 0.9% FLUSH
10.0000 mL | Freq: Once | INTRAVENOUS | Status: AC
  Administered 2020-06-28: 10 mL
  Filled 2020-06-28: qty 10

## 2020-06-28 MED ORDER — METHYLPREDNISOLONE SODIUM SUCC 125 MG IJ SOLR
100.0000 mg | Freq: Once | INTRAMUSCULAR | Status: AC
  Administered 2020-06-28: 100 mg via INTRAVENOUS

## 2020-06-28 MED ORDER — SODIUM CHLORIDE 0.9 % IV SOLN
80.0000 mg/m2 | Freq: Once | INTRAVENOUS | Status: AC
  Administered 2020-06-28: 132 mg via INTRAVENOUS
  Filled 2020-06-28: qty 22

## 2020-06-28 MED ORDER — SODIUM CHLORIDE 0.9 % IV SOLN
Freq: Once | INTRAVENOUS | Status: AC
  Filled 2020-06-28: qty 250

## 2020-06-28 MED ORDER — DIPHENHYDRAMINE HCL 50 MG/ML IJ SOLN
INTRAMUSCULAR | Status: AC
  Filled 2020-06-28: qty 1

## 2020-06-28 MED ORDER — DIPHENHYDRAMINE HCL 50 MG/ML IJ SOLN
50.0000 mg | Freq: Once | INTRAMUSCULAR | Status: AC
  Administered 2020-06-28: 50 mg via INTRAVENOUS

## 2020-06-28 MED ORDER — HEPARIN SOD (PORK) LOCK FLUSH 100 UNIT/ML IV SOLN
500.0000 [IU] | Freq: Once | INTRAVENOUS | Status: AC | PRN
  Administered 2020-06-28: 500 [IU]
  Filled 2020-06-28: qty 5

## 2020-06-28 MED ORDER — FAMOTIDINE IN NACL 20-0.9 MG/50ML-% IV SOLN
INTRAVENOUS | Status: AC
  Filled 2020-06-28: qty 50

## 2020-06-28 MED ORDER — SODIUM CHLORIDE 0.9 % IV SOLN
100.0000 mg | Freq: Once | INTRAVENOUS | Status: AC
  Administered 2020-06-28: 100 mg via INTRAVENOUS
  Filled 2020-06-28: qty 10

## 2020-06-28 NOTE — Progress Notes (Signed)
Per Cassie Heilingoetter PA-C, ok to treat with ANC 1.3.

## 2020-06-28 NOTE — Patient Instructions (Signed)

## 2020-06-28 NOTE — Patient Instructions (Signed)
[facility] Discharge Instructions for Patients Receiving Chemotherapy  Today you received the following chemotherapy agents: paclitaxel and carboplatin.  To help prevent nausea and vomiting after your treatment, we encourage you to take your nausea medication as directed.   If you develop nausea and vomiting that is not controlled by your nausea medication, call the clinic.   BELOW ARE SYMPTOMS THAT SHOULD BE REPORTED IMMEDIATELY:  *FEVER GREATER THAN 100.5 F  *CHILLS WITH OR WITHOUT FEVER  NAUSEA AND VOMITING THAT IS NOT CONTROLLED WITH YOUR NAUSEA MEDICATION  *UNUSUAL SHORTNESS OF BREATH  *UNUSUAL BRUISING OR BLEEDING  TENDERNESS IN MOUTH AND THROAT WITH OR WITHOUT PRESENCE OF ULCERS  *URINARY PROBLEMS  *BOWEL PROBLEMS  UNUSUAL RASH Items with * indicate a potential emergency and should be followed up as soon as possible.  Feel free to call the clinic should you have any questions or concerns. The clinic phone number is [phone].  Please show the CHEMO ALERT CARD at check-in to the Emergency Department and triage nurse.   

## 2020-07-01 NOTE — Progress Notes (Signed)
[place] OFFICE PROGRESS NOTE  [name], MD [place] [address] [place] [address]  DIAGNOSIS: F/u of left breast cancer  Oncology History Overview Note  Cancer Staging Malignant neoplasm of upper-outer quadrant of left breast in female, estrogen receptor positive ([place]) Staging form: Breast, AJCC 8th Edition - Clinical stage from 03/01/2020: Stage IV (cT4, cN0, pM1, G3, ER+, PR-, HER2-) - Signed by [name], MD on 03/06/2020    Malignant neoplasm of upper-outer quadrant of left breast in female, estrogen receptor positive ([place])  02/28/2020 Mammogram   Diagnostic Mammogram 02/28/20  IMPRESSION The 3.9 cm ill defined mass in the left breast posterior depth central 4.3cm to the nipple seen on the mediolateral oblique view onlt with associated difssue skin thickening is highly suspicious for malignancy.    03/01/2020 Cancer Staging   Staging form: Breast, AJCC 8th Edition - Clinical stage from 03/01/2020: Stage IV (cT4, cN0, pM1, G3, ER+, PR-, HER2-) - Signed by [name], MD on 03/06/2020   03/01/2020 Initial Biopsy   Diagnosis 03/01/20  1. Breast, left, needle core biopsy, 9 o'clock, 3-4cmfn - INVASIVE DUCTAL CARCINOMA. SEE NOTE 2. Lymph node, needle/core biopsy, right axilla - INVASIVE DUCTAL CARCINOMA. SEE NOTE Diagnosis Note 1. Invasive carcinoma measures 1.5 cm in greatest linear dimension and appears grade 3. Dr. [name] reviewed the case and concurs with the diagnosis. A breast prognostic profile (ER, PR, Ki-67 and HER2) is pending and will be reported in an addendum. Dr. [name] was notified on 03/04/2020. 2. Carcinoma measures 0.6 cm in greatest linear dimension and appears grade 3. Lymphoid tissue is not identified - this may represent a completely replaced lymph node. A breast prognostic profile (ER, PR, Ki-67 and HER2) is pending and will be reported in an addendum. Dr. [name] reviewed the case and concurs with the diagnosis. Dr. [name] was  notified on 03/04/2020.   03/01/2020 Receptors her2   1. PROGNOSTIC INDICATORS Results: IMMUNOHISTOCHEMICAL AND MORPHOMETRIC ANALYSIS PERFORMED MANUALLY The tumor cells are NEGATIVE for Her2 (0). Estrogen Receptor: 20%, POSITIVE, WEAK STAINING INTENSITY Progesterone Receptor: 0%, NEGATIVE Proliferation Marker Ki67: 40% COMMENT: The negative hormone receptor study(ies) in this case has an internal positive control.    2. PROGNOSTIC INDICATORS Results: IMMUNOHISTOCHEMICAL AND MORPHOMETRIC ANALYSIS PERFORMED MANUALLY The tumor cells are NEGATIVE for Her2 (1+). Estrogen Receptor: 10%, POSITIVE, WEAK STAINING INTENSITY Progesterone Receptor: 0%, NEGATIVE Proliferation Marker Ki67: 40% COMMENT: The negative hormone receptor study(ies) in this case has no internal positive control.   03/05/2020 Initial Diagnosis   Malignant neoplasm of upper-outer quadrant of left breast in female, estrogen receptor positive ([facility])   03/18/2020 Breast MRI   IMPRESSION: 1. 7 x 9 x 6 cm area of suspicious non masslike enhancement within the central/UPPER RIGHT breast with anterior skin thickening. Given biopsy-proven metastatic RIGHT axillary lymph node, tissue sampling of the anterior and posterior aspects of this non masslike RIGHT breast enhancement is recommended to exclude malignancy. 2. 7 x 6 x 5 cm diffuse masslike and nonmasslike enhancement within the majority of the remaining LEFT breast with diffuse LEFT breast skin thickening, compatible with malignancy. Abnormal enhancement is noted along the anterior aspect of the LEFT pectoralis muscle. 3. Three abnormal RIGHT axillary lymph nodes, 1 which is biopsy proven to be metastatic disease. No abnormal LEFT axillary lymph nodes identified.   03/20/2020 Echocardiogram   Baseline ECHO  IMPRESSIONS     1. The patient was reported to be in pain during the study and not able  to participate in this study. All that can be said is that the LV and RV   function are grossly normal. Would recommend to repeat this study when/if  the patient is able to tolerate  the exam.   2. Left ventricular ejection fraction, by estimation, is 60 to 65%. The  left ventricle has normal function. Left ventricular endocardial border  not optimally defined to evaluate regional wall motion. Left ventricular  diastolic function could not be  evaluated.   3. Right ventricular systolic function is normal. The right ventricular  size is normal.   4. The mitral valve was not well visualized. No evidence of mitral valve  regurgitation.   5. The aortic valve was not well visualized. Aortic valve regurgitation  is not visualized.   6. The inferior vena cava is normal in size with greater than 50%  respiratory variability, suggesting right atrial pressure of 3 mmHg.    03/20/2020 Imaging   CT CAP W contrast  IMPRESSION: 1. Small right axillary/subpectoral lymph nodes. Difficult to exclude metastatic disease. 2. Subtle 6 mm low-attenuation lesion in the dome of the liver, not well seen previously. Lesion is too small to characterize. Further evaluation could be performed with MR abdomen without and with contrast, as clinically indicated. 3. Hepatic steatosis. 4. Aortic atherosclerosis (ICD10-I70.0). Coronary artery calcification.     03/20/2020 Imaging   Whole body bone scan  IMPRESSION: 1. Increased activity noted over the right breast, possibly related to the patient's right breast cancer.   2. Punctate area of increased activity over the left maxillary region, most likely related to sinus disease or dental disease. Increased activity noted over the left mandible also possibly related to dental disease. No other focal bony abnormalities to suggest metastatic disease identified.   03/22/2020 Procedure   PAC placement by Dr [name]    03/22/2020 -  Chemotherapy   AC q2weeks for 4 cycles 03/22/20-05/03/20 followed by weekly Taxol and Carboplatin q3weeks for  12 weeks starting 05/17/20.    03/22/2020 Pathology Results   FINAL MICROSCOPIC DIAGNOSIS:   A. BREAST, RIGHT, PUNCH BIOPSY:  - Carcinoma involving dermal lymphatics.  - See comment.   B. BREAST, LEFT, PUNCH BIOPSY:  - Carcinoma involving dermal lymphatics.  - See comment.   COMMENT:  The morphology is compatible with ductal breast carcinoma.    ADDENDUM:   PROGNOSTIC INDICATOR RESULTS:   Immunohistochemical and morphometric analysis performed manually   The tumor cells are NEGATIVE for Her2 (0%).   Estrogen Receptor:       NEGATIVE, 0%  Progesterone Receptor:   NEGATIVE, 0%     Current Therapy: AC q2weeks for 4 cycles 03/22/20-05/03/20 followed by weekly Taxoland Carboplatin q3weeksfor 12 weeks starting 05/17/20.Week 2 Taxol reduced to half dose and CT was dose reduced starting with week 4 due to thrombocytopenia.Given pancytopenia,will change Carboplatin to 50% dose weekly with Taxol 40m/m2starting with week 7 (06/13/20). Will hold Carboplatin as needed based on labs  INTERVAL HISTORY: NCARALEE MOREA65y.o. female returns to the clinic today for a follow up visit. The patient is feeling well today today without any concerning complaints. The patient tolerated her last treatment well without any concerning complaints. She denies any fevers, chills, weight loss, or night sweats. She does report that foods high in carbs taste like plaster. Denies sore throat, skin infections, dysuria, cough, or shortness of breath. She denies nausea, vomiting, diarrhea, or constipation. Denies peripheral neuropathy. Denies worsening fatigue. She reports her breast lump is stable  and she believes her skin darkening is getting lighter over the left breast. She is here for evaluation before starting week 7 of 12 of carbo/taxol.   MEDICAL HISTORY: Past Medical History:  Diagnosis Date  . Breast cancer ([facility]) dx'd 1993   left  . History of left breast cancer   . HIV infection ([place])   . Hypertension      ALLERGIES:  is allergic to codeine, other, grapefruit bioflavonoid complex, pomegranate [punica], and shellfish allergy.  MEDICATIONS:  Current Outpatient Medications  Medication Sig Dispense Refill  . emtricitabine-rilpivir-tenofovir AF (ODEFSEY) 200-25-25 MG TABS tablet Take 1 tablet by mouth daily with breakfast. 30 tablet 11  . hydrochlorothiazide (HYDRODIURIL) 25 MG tablet Take 1 tablet (25 mg total) by mouth daily. 30 tablet 11  . lidocaine-prilocaine (EMLA) cream Apply to affected area once 30 g 3  . loratadine (CLARITIN) 10 MG tablet Take 10 mg by mouth daily. Takes 3-4 days before and after treatment    . potassium chloride SA (KLOR-CON) 20 MEQ tablet Take 1 tablet (20 mEq total) by mouth 2 (two) times daily. Take 1 tab twice daily for 3 days then 1 tab once daily 60 tablet 1  . acetaminophen (TYLENOL) 500 MG tablet Take 1,000 mg by mouth every 6 (six) hours as needed for moderate pain. (Patient not taking: Reported on 06/28/2020)    . ondansetron (ZOFRAN) 8 MG tablet Take 1 tablet (8 mg total) by mouth 2 (two) times daily as needed. Start on the third day after chemotherapy. (Patient not taking: Reported on 06/28/2020) 30 tablet 2  . oxyCODONE (OXY IR/ROXICODONE) 5 MG immediate release tablet Take 1 tablet (5 mg total) by mouth every 6 (six) hours as needed for severe pain. (Patient not taking: Reported on 06/28/2020) 15 tablet 0  . prochlorperazine (COMPAZINE) 10 MG tablet Take 1 tablet (10 mg total) by mouth every 6 (six) hours as needed (Nausea or vomiting). (Patient not taking: Reported on 06/28/2020) 30 tablet 2   No current facility-administered medications for this visit.    SURGICAL HISTORY:  Past Surgical History:  Procedure Laterality Date  . BREAST BIOPSY Bilateral 03/22/2020   Procedure: BILATERAL BREAST PUNCH BIOPSIES;  Surgeon: [name], MD;  Location: [facility];  Service: General;  Laterality: Bilateral;  . BREAST LUMPECTOMY    . CESAREAN SECTION    . left breast  lumpectomy  1993  . PORTACATH PLACEMENT Right 03/22/2020   Procedure: INSERTION PORT-A-CATH WITH ULTRASOUND GUIDANCE;  Surgeon: [name], MD;  Location: [facility];  Service: General;  Laterality: Right;    REVIEW OF SYSTEMS:   Review of Systems  Constitutional: Negative for appetite change, chills, fatigue, fever and unexpected weight change.  HENT: Positive for taste changes. Negative for mouth sores, nosebleeds, sore throat and trouble swallowing.  Eyes: Negative for eye problems and icterus.  Respiratory: Negative for cough, hemoptysis, shortness of breath and wheezing.   Cardiovascular: Negative for chest pain and leg swelling.  Gastrointestinal: Negative for abdominal pain, constipation, diarrhea, nausea and vomiting.  Genitourinary: Negative for bladder incontinence, difficulty urinating, dysuria, frequency and hematuria.   Musculoskeletal: Negative for back pain, gait problem, neck pain and neck stiffness.  Skin: Negative for itching and rash. Positive for skin thickening and discoloration on left breast.  Neurological: Negative for dizziness, extremity weakness, gait problem, headaches, light-headedness and seizures.  Hematological: Negative for adenopathy. Does not bruise/bleed easily.  Psychiatric/Behavioral: Negative for confusion, depression and sleep disturbance. The patient is not nervous/anxious.  PHYSICAL EXAMINATION:  Blood pressure 136/90, pulse 92, temperature (!) 96 F (35.6 C), temperature source Tympanic, resp. rate 18, height 5' 1" (1.549 m), weight 141 lb 14.4 oz (64.4 kg), SpO2 100 %.  ECOG PERFORMANCE STATUS: 1 - Symptomatic but completely ambulatory  Physical Exam  Constitutional: Oriented to person, place, and time and well-developed, well-nourished, and in no distress.  HENT:  Head: Normocephalic and atraumatic.  Mouth/Throat: Oropharynx is clear and moist. No oropharyngeal exudate.  Eyes: Conjunctivae are normal. Right eye exhibits no discharge. Left  eye exhibits no discharge. No scleral icterus.  Neck: Normal range of motion. Neck supple.  Cardiovascular: Normal rate, regular rhythm, normal heart sounds and intact distal pulses.  Pulmonary/Chest: Effort normal and breath sounds normal. No respiratory distress. No wheezes. No rales.  Abdominal: Soft. Bowel sounds are normal. Exhibits no distension and no mass. There is no tenderness.  Musculoskeletal: Normal range of motion. Exhibits no edema.  Lymphadenopathy:  No cervical adenopathy.  Neurological: Alert and oriented to person, place, and time. Exhibits normal muscle tone. Gait normal. Coordination normal.  Skin: Skin is warm and dry. No rash noted. Not diaphoretic. No erythema. No pallor.  Psychiatric: Mood, memory and judgment normal.  BREAST:(+) Skin thickening of her left breast with hyperpigmentation (+). ~11x8 cm.Right breast 6x4cm palpable mass in RUQ withmildskin erythema. Right breast larger than left.  LABORATORY DATA: Lab Results  Component Value Date   WBC 3.3 (L) 07/05/2020   HGB 9.0 (L) 07/05/2020   HCT 26.5 (L) 07/05/2020   MCV 90.8 07/05/2020   PLT 212 07/05/2020      Chemistry      Component Value Date/Time   NA 135 07/05/2020 0825   K 3.8 07/05/2020 0825   CL 104 07/05/2020 0825   CO2 24 07/05/2020 0825   BUN 18 07/05/2020 0825   CREATININE 0.79 07/05/2020 0825   CREATININE 0.82 02/09/2020 0855      Component Value Date/Time   CALCIUM 9.0 07/05/2020 0825   ALKPHOS 150 (H) 07/05/2020 0825   AST 25 07/05/2020 0825   ALT 20 07/05/2020 0825   BILITOT 0.2 (L) 07/05/2020 0825       RADIOGRAPHIC STUDIES:  No results found.   ASSESSMENT/PLAN:  KHRISTIN KELEHER a 64 y.o.femalewith    1.Left breast cancer,cT4N0M1, possible stageIVwithinflammatory to skin of B/l breast (triple negative) andright breat cancer in [facility] withaxillary node metastasis, ERweakly+,PR/HER2-, Azucena Kuba -She was recently diagnosed with left breast cancer in  02/2020.Her Mammogram showed an at least 4cm mass of left breast. Based on initial exam sheprobably hasskin involvementand her entire left breast is occupied by cancer. Her mammogram also showed right LN enlargement. 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 It is not definitive if her right LN are related to her left breast cancer, or if she hasasecond right breast cancer -Her breast MRIfrom 5/17/21showedleft known breast cancer, she also has a large non-mass-like enhancement in the central upper right breast, highly suspicious for malignancy. Dr. [name] did punch skin biopsy of both right and left breaston 03/22/20, whichallcame back positive for carcinomawith ER/PR/HER2 negative markers.We discussed the option of right breast biopsy,versus right mastectomy. Patient would like to proceed with bilateral mastectomy.No need for right breast mass biopsy in this case. -Staging scans were negative for distant metastasis -Dr. Burr Medico started her on neoadjuvantchemotherapy as first-line treatmentwithAdriamycin and Cytoxanwhich she completed 4cycles5/21/21-05/03/20.She then proceeded with weekly Taxoland carboplatinevery 3 weeksfor 12 weeksstarting 05/17/20.Given pancytopenia,Dr. Burr Medico changed  Carboplatin to 50% dose weekly with Taxol 31m/m2starting with week 7 (06/13/20). Will hold Carboplatin based on labs that week.  -She was able to tolerate lower dose CT much better. Labs reviewed, WBC 3.3, Hg 9.0, ANC 1.8, platelets normal. The patient will proceed with week 7 of 12 of carboplatin and taxol today as scheduled. -F/u next week, and will determine if she will receive c[place]otsw[place]on a weekly basis based on lab work that day. If receiving carboplatin, will do weekly with AUC 1 -She plans to proceed with COVID19 booster with our clinic. If she practices low exposure she can wait until she is done with chemo to have booster per Dr. FBurr Medico -F/U in  1 week  2. Thrombocytopeniaand neutropenia -Secondary to chemo -Thrombocytopenia has resolved. (Platelet 212k today 07/05/20) -We will reduce hercarboplatin dose, Dr. FBurr Medicodoes not plan on giving G-CSFsince neutropenia resolved with chemo dose reduction -Neutropenia improved today with ANC of 1.8 (07/05/20)  3. Anemia, secondary to chemo -Will give blood transfusion for Hg <7.  -She is currently on prenatal vitamin. -Her Hgdid further decrease to 7.6on7/23/21. She was treated with blood transfusion on 05/27/20 -Anemia responded to blood transfusion.Her anemia has been mild and stable in 10 range lately on treatment.  4. Weight Loss, Low appetite -Her weight has been trending down.She notes she has been fine with lose weight. She has lost 10 poundsover amonth.  -Dr. FBurr Medicopreviouslyencouraged her to focuson maintaining weight instead of losing weight while on chemo so she is strong enough to continue. -Due to taste change she has not been consuming much carbohydratesand due to diet change she eats less fatty foods.She had increased proteinandvegetablesin her diet. Dr. FBurr Medicoalso encouraged her to increase calorie intake. -She has been able to gain weight with lactacid ice cream. -She continues to eat and drink adequately and maintain her weight.She will continue to f/u with Dietician.  5. H/o Left breast cancer, Dx in 1993,Genetic Testinghas not been done yet.  6. Comorbidities: HIV(+)Dx 1993, HTN -Her HIV has been undetectable and well controlled. She is currently being seen by ID Dr SGraylon Good-BP has been normal lately and given her lower potassium,we have heldHCTZsince7/30/21.  7.Mild transaminitis  -Unclear etiology, presents before chemotherapy.Her6/16/21 MRIabdomenwas benign. -Her LFTshave resolved (05/24/20)with mildly elevated Alk Phos.  8.Hypokalemia  -Secondary to chemo -Due to improved potassium, Dr. FBurr Medicoadvised her to stop  HCTZ and reduce potassium to once daily(05/31/20)   PLAN: -Labs reviewed. Recommend that she proceed with Carboplatin for an AUC of 1 and taxol 80 mg/m2 today as scheduled. -Lab, flush, F/uand chemonext week. -CA 27.29 pending from today.    No orders of the defined types were placed in this encounter.    Chelisa Hennen L Meril Dray, PA-C 07/05/20

## 2020-07-05 ENCOUNTER — Inpatient Hospital Stay: Payer: BC Managed Care – PPO

## 2020-07-05 ENCOUNTER — Encounter: Payer: Self-pay | Admitting: *Deleted

## 2020-07-05 ENCOUNTER — Other Ambulatory Visit: Payer: Self-pay

## 2020-07-05 ENCOUNTER — Inpatient Hospital Stay: Payer: BC Managed Care – PPO | Attending: Hematology | Admitting: Physician Assistant

## 2020-07-05 VITALS — BP 136/90 | HR 92 | Temp 96.0°F | Resp 18 | Ht 61.0 in | Wt 141.9 lb

## 2020-07-05 DIAGNOSIS — C50412 Malignant neoplasm of upper-outer quadrant of left female breast: Secondary | ICD-10-CM | POA: Diagnosis present

## 2020-07-05 DIAGNOSIS — Z853 Personal history of malignant neoplasm of breast: Secondary | ICD-10-CM | POA: Insufficient documentation

## 2020-07-05 DIAGNOSIS — I10 Essential (primary) hypertension: Secondary | ICD-10-CM | POA: Diagnosis not present

## 2020-07-05 DIAGNOSIS — D6481 Anemia due to antineoplastic chemotherapy: Secondary | ICD-10-CM | POA: Insufficient documentation

## 2020-07-05 DIAGNOSIS — Z170 Estrogen receptor positive status [ER+]: Secondary | ICD-10-CM

## 2020-07-05 DIAGNOSIS — Z171 Estrogen receptor negative status [ER-]: Secondary | ICD-10-CM | POA: Insufficient documentation

## 2020-07-05 DIAGNOSIS — Z21 Asymptomatic human immunodeficiency virus [HIV] infection status: Secondary | ICD-10-CM | POA: Insufficient documentation

## 2020-07-05 DIAGNOSIS — Z5111 Encounter for antineoplastic chemotherapy: Secondary | ICD-10-CM | POA: Insufficient documentation

## 2020-07-05 DIAGNOSIS — R634 Abnormal weight loss: Secondary | ICD-10-CM | POA: Insufficient documentation

## 2020-07-05 DIAGNOSIS — C773 Secondary and unspecified malignant neoplasm of axilla and upper limb lymph nodes: Secondary | ICD-10-CM | POA: Diagnosis not present

## 2020-07-05 DIAGNOSIS — D701 Agranulocytosis secondary to cancer chemotherapy: Secondary | ICD-10-CM | POA: Insufficient documentation

## 2020-07-05 DIAGNOSIS — Z95828 Presence of other vascular implants and grafts: Secondary | ICD-10-CM

## 2020-07-05 DIAGNOSIS — E876 Hypokalemia: Secondary | ICD-10-CM | POA: Insufficient documentation

## 2020-07-05 DIAGNOSIS — D61818 Other pancytopenia: Secondary | ICD-10-CM | POA: Insufficient documentation

## 2020-07-05 LAB — CBC WITH DIFFERENTIAL (CANCER CENTER ONLY)
Abs Immature Granulocytes: 0.01 10*3/uL (ref 0.00–0.07)
Basophils Absolute: 0 10*3/uL (ref 0.0–0.1)
Basophils Relative: 0 %
Eosinophils Absolute: 0 10*3/uL (ref 0.0–0.5)
Eosinophils Relative: 0 %
HCT: 26.5 % — ABNORMAL LOW (ref 36.0–46.0)
Hemoglobin: 9 g/dL — ABNORMAL LOW (ref 12.0–15.0)
Immature Granulocytes: 0 %
Lymphocytes Relative: 34 %
Lymphs Abs: 1.1 10*3/uL (ref 0.7–4.0)
MCH: 30.8 pg (ref 26.0–34.0)
MCHC: 34 g/dL (ref 30.0–36.0)
MCV: 90.8 fL (ref 80.0–100.0)
Monocytes Absolute: 0.3 10*3/uL (ref 0.1–1.0)
Monocytes Relative: 10 %
Neutro Abs: 1.8 10*3/uL (ref 1.7–7.7)
Neutrophils Relative %: 56 %
Platelet Count: 212 10*3/uL (ref 150–400)
RBC: 2.92 MIL/uL — ABNORMAL LOW (ref 3.87–5.11)
RDW: 19.4 % — ABNORMAL HIGH (ref 11.5–15.5)
WBC Count: 3.3 10*3/uL — ABNORMAL LOW (ref 4.0–10.5)
nRBC: 0 % (ref 0.0–0.2)

## 2020-07-05 LAB — CMP (CANCER CENTER ONLY)
ALT: 20 U/L (ref 0–44)
AST: 25 U/L (ref 15–41)
Albumin: 3.4 g/dL — ABNORMAL LOW (ref 3.5–5.0)
Alkaline Phosphatase: 150 U/L — ABNORMAL HIGH (ref 38–126)
Anion gap: 7 (ref 5–15)
BUN: 18 mg/dL (ref 8–23)
CO2: 24 mmol/L (ref 22–32)
Calcium: 9 mg/dL (ref 8.9–10.3)
Chloride: 104 mmol/L (ref 98–111)
Creatinine: 0.79 mg/dL (ref 0.44–1.00)
GFR, Est AFR Am: >60 mL/min (ref >60)
GFR, Estimated: >60 mL/min (ref >60)
Glucose, Bld: 92 mg/dL (ref 70–99)
Potassium: 3.8 mmol/L (ref 3.5–5.1)
Sodium: 135 mmol/L (ref 135–145)
Total Bilirubin: 0.2 mg/dL — ABNORMAL LOW (ref 0.3–1.2)
Total Protein: 7 g/dL (ref 6.5–8.1)

## 2020-07-05 MED ORDER — SODIUM CHLORIDE 0.9 % IV SOLN
80.0000 mg/m2 | Freq: Once | INTRAVENOUS | Status: AC
  Administered 2020-07-05: 132 mg via INTRAVENOUS
  Filled 2020-07-05: qty 22

## 2020-07-05 MED ORDER — HEPARIN SOD (PORK) LOCK FLUSH 100 UNIT/ML IV SOLN
500.0000 [IU] | Freq: Once | INTRAVENOUS | Status: AC | PRN
  Administered 2020-07-05: 500 [IU]
  Filled 2020-07-05: qty 5

## 2020-07-05 MED ORDER — DIPHENHYDRAMINE HCL 50 MG/ML IJ SOLN
50.0000 mg | Freq: Once | INTRAMUSCULAR | Status: AC
  Administered 2020-07-05: 50 mg via INTRAVENOUS

## 2020-07-05 MED ORDER — PALONOSETRON HCL INJECTION 0.25 MG/5ML
INTRAVENOUS | Status: AC
  Filled 2020-07-05: qty 5

## 2020-07-05 MED ORDER — SODIUM CHLORIDE 0.9% FLUSH
10.0000 mL | INTRAVENOUS | Status: DC | PRN
  Administered 2020-07-05: 10 mL
  Filled 2020-07-05: qty 10

## 2020-07-05 MED ORDER — METHYLPREDNISOLONE SODIUM SUCC 125 MG IJ SOLR
50.0000 mg | Freq: Once | INTRAMUSCULAR | Status: AC
  Administered 2020-07-05: 50 mg via INTRAVENOUS

## 2020-07-05 MED ORDER — DIPHENHYDRAMINE HCL 50 MG/ML IJ SOLN
INTRAMUSCULAR | Status: AC
  Filled 2020-07-05: qty 1

## 2020-07-05 MED ORDER — SODIUM CHLORIDE 0.9 % IV SOLN
Freq: Once | INTRAVENOUS | Status: AC
  Filled 2020-07-05: qty 250

## 2020-07-05 MED ORDER — PALONOSETRON HCL INJECTION 0.25 MG/5ML
0.2500 mg | Freq: Once | INTRAVENOUS | Status: AC
  Administered 2020-07-05: 0.25 mg via INTRAVENOUS

## 2020-07-05 MED ORDER — SODIUM CHLORIDE 0.9 % IV SOLN
100.0000 mg | Freq: Once | INTRAVENOUS | Status: AC
  Administered 2020-07-05: 100 mg via INTRAVENOUS
  Filled 2020-07-05: qty 10

## 2020-07-05 MED ORDER — SODIUM CHLORIDE 0.9 % IV SOLN
150.0000 mg | Freq: Once | INTRAVENOUS | Status: AC
  Administered 2020-07-05: 150 mg via INTRAVENOUS
  Filled 2020-07-05: qty 150

## 2020-07-05 MED ORDER — METHYLPREDNISOLONE SODIUM SUCC 125 MG IJ SOLR
INTRAMUSCULAR | Status: AC
  Filled 2020-07-05: qty 2

## 2020-07-05 MED ORDER — FAMOTIDINE IN NACL 20-0.9 MG/50ML-% IV SOLN
20.0000 mg | Freq: Once | INTRAVENOUS | Status: AC
  Administered 2020-07-05: 20 mg via INTRAVENOUS

## 2020-07-05 MED ORDER — SODIUM CHLORIDE 0.9% FLUSH
10.0000 mL | Freq: Once | INTRAVENOUS | Status: AC
  Administered 2020-07-05: 10 mL
  Filled 2020-07-05: qty 10

## 2020-07-05 MED ORDER — FAMOTIDINE IN NACL 20-0.9 MG/50ML-% IV SOLN
INTRAVENOUS | Status: AC
  Filled 2020-07-05: qty 50

## 2020-07-05 NOTE — Patient Instructions (Signed)
[facility] °Discharge Instructions for Patients Receiving Chemotherapy ° °Today you received the following chemotherapy agents Taxol; Carboplatin ° °To help prevent nausea and vomiting after your treatment, we encourage you to take your nausea medication as directed °  °If you develop nausea and vomiting that is not controlled by your nausea medication, call the clinic.  ° °BELOW ARE SYMPTOMS THAT SHOULD BE REPORTED IMMEDIATELY: °· *FEVER GREATER THAN 100.5 F °· *CHILLS WITH OR WITHOUT FEVER °· NAUSEA AND VOMITING THAT IS NOT CONTROLLED WITH YOUR NAUSEA MEDICATION °· *UNUSUAL SHORTNESS OF BREATH °· *UNUSUAL BRUISING OR BLEEDING °· TENDERNESS IN MOUTH AND THROAT WITH OR WITHOUT PRESENCE OF ULCERS °· *URINARY PROBLEMS °· *BOWEL PROBLEMS °· UNUSUAL RASH °Items with * indicate a potential emergency and should be followed up as soon as possible. ° °Feel free to call the clinic should you have any questions or concerns. The clinic phone number is [phone]. ° °Please show the CHEMO ALERT CARD at check-in to the Emergency Department and triage nurse. ° ° °

## 2020-07-05 NOTE — Patient Instructions (Signed)

## 2020-07-06 LAB — CANCER ANTIGEN 27.29: CA 27.29: 96.9 U/mL — ABNORMAL HIGH (ref 0.0–38.6)

## 2020-07-10 NOTE — Progress Notes (Signed)
[place]   [phone] [phone]   Clinic Follow up Note   Patient Care Team: [name], MD as PCP - General (Infectious Diseases) [name], MD as PCP - Infectious Diseases (Infectious Diseases) [name], MD as Consulting Physician (General Surgery) [name], MD as Consulting Physician (Hematology) [name], MD as Consulting Physician (Radiation Oncology) [name], RN as Oncology Nurse Navigator [name], RN as Oncology Nurse Navigator  Date of Service:  07/12/2020  CHIEF COMPLAINT:  F/u of left breast cancer  SUMMARY OF ONCOLOGIC HISTORY: Oncology History Overview Note  Cancer Staging Malignant neoplasm of upper-outer quadrant of left breast in female, estrogen receptor positive ([place]) Staging form: Breast, AJCC 8th Edition - Clinical stage from 03/01/2020: Stage IV (cT4, cN0, pM1, G3, ER+, PR-, HER2-) - Signed by [name], MD on 03/06/2020    Malignant neoplasm of upper-outer quadrant of left breast in female, estrogen receptor positive ([place])  02/28/2020 Mammogram   Diagnostic Mammogram 02/28/20  IMPRESSION The 3.9 cm ill defined mass in the left breast posterior depth central 4.3cm to the nipple seen on the mediolateral oblique view onlt with associated difssue skin thickening is highly suspicious for malignancy.    03/01/2020 Cancer Staging   Staging form: Breast, AJCC 8th Edition - Clinical stage from 03/01/2020: Stage IV (cT4, cN0, pM1, G3, ER+, PR-, HER2-) - Signed by [name], MD on 03/06/2020   03/01/2020 Initial Biopsy   Diagnosis 03/01/20  1. Breast, left, needle core biopsy, 9 o'clock, 3-4cmfn - INVASIVE DUCTAL CARCINOMA. SEE NOTE 2. Lymph node, needle/core biopsy, right axilla - INVASIVE DUCTAL CARCINOMA. SEE NOTE Diagnosis Note 1. Invasive carcinoma measures 1.5 cm in greatest linear dimension and appears grade 3. Dr. [name] reviewed the case and concurs with the diagnosis. A breast prognostic  profile (ER, PR, Ki-67 and HER2) is pending and will be reported in an addendum. Dr. [name] was notified on 03/04/2020. 2. Carcinoma measures 0.6 cm in greatest linear dimension and appears grade 3. Lymphoid tissue is not identified - this may represent a completely replaced lymph node. A breast prognostic profile (ER, PR, Ki-67 and HER2) is pending and will be reported in an addendum. Dr. [name] reviewed the case and concurs with the diagnosis. Dr. [name] was notified on 03/04/2020.   03/01/2020 Receptors her2   1. PROGNOSTIC INDICATORS Results: IMMUNOHISTOCHEMICAL AND MORPHOMETRIC ANALYSIS PERFORMED MANUALLY The tumor cells are NEGATIVE for Her2 (0). Estrogen Receptor: 20%, POSITIVE, WEAK STAINING INTENSITY Progesterone Receptor: 0%, NEGATIVE Proliferation Marker Ki67: 40% COMMENT: The negative hormone receptor study(ies) in this case has an internal positive control.    2. PROGNOSTIC INDICATORS Results: IMMUNOHISTOCHEMICAL AND MORPHOMETRIC ANALYSIS PERFORMED MANUALLY The tumor cells are NEGATIVE for Her2 (1+). Estrogen Receptor: 10%, POSITIVE, WEAK STAINING INTENSITY Progesterone Receptor: 0%, NEGATIVE Proliferation Marker Ki67: 40% COMMENT: The negative hormone receptor study(ies) in this case has no internal positive control.   03/05/2020 Initial Diagnosis   Malignant neoplasm of upper-outer quadrant of left breast in female, estrogen receptor positive ([place])   03/18/2020 Breast MRI   IMPRESSION: 1. 7 x 9 x 6 cm area of suspicious non masslike enhancement within the central/UPPER RIGHT breast with anterior skin thickening. Given biopsy-proven metastatic RIGHT axillary lymph node, tissue sampling of the anterior and posterior aspects of this non masslike RIGHT breast enhancement is recommended to exclude malignancy. 2. 7 x 6 x 5 cm diffuse masslike and nonmasslike enhancement within the majority of the remaining LEFT breast with diffuse LEFT  breast skin thickening, compatible  with malignancy. Abnormal enhancement is noted along the anterior aspect of the LEFT pectoralis muscle. 3. Three abnormal RIGHT axillary lymph nodes, 1 which is biopsy proven to be metastatic disease. No abnormal LEFT axillary lymph nodes identified.   03/20/2020 Echocardiogram   Baseline ECHO  IMPRESSIONS     1. The patient was reported to be in pain during the study and not able  to participate in this study. All that can be said is that the LV and RV  function are grossly normal. Would recommend to repeat this study when/if  the patient is able to tolerate  the exam.   2. Left ventricular ejection fraction, by estimation, is 60 to 65%. The  left ventricle has normal function. Left ventricular endocardial border  not optimally defined to evaluate regional wall motion. Left ventricular  diastolic function could not be  evaluated.   3. Right ventricular systolic function is normal. The right ventricular  size is normal.   4. The mitral valve was not well visualized. No evidence of mitral valve  regurgitation.   5. The aortic valve was not well visualized. Aortic valve regurgitation  is not visualized.   6. The inferior vena cava is normal in size with greater than 50%  respiratory variability, suggesting right atrial pressure of 3 mmHg.    03/20/2020 Imaging   CT CAP W contrast  IMPRESSION: 1. Small right axillary/subpectoral lymph nodes. Difficult to exclude metastatic disease. 2. Subtle 6 mm low-attenuation lesion in the dome of the liver, not well seen previously. Lesion is too small to characterize. Further evaluation could be performed with MR abdomen without and with contrast, as clinically indicated. 3. Hepatic steatosis. 4. Aortic atherosclerosis (ICD10-I70.0). Coronary artery calcification.     03/20/2020 Imaging   Whole body bone scan  IMPRESSION: 1. Increased activity noted over the right breast, possibly related to the patient's right breast cancer.   2.  Punctate area of increased activity over the left maxillary region, most likely related to sinus disease or dental disease. Increased activity noted over the left mandible also possibly related to dental disease. No other focal bony abnormalities to suggest metastatic disease identified.   03/22/2020 Procedure   PAC placement by Dr [name]    03/22/2020 -  Chemotherapy   AC q2weeks for 4 cycles 03/22/20-05/03/20 followed by weekly Taxol and Carboplatin q3weeks for 12 weeks starting 05/17/20.             Week 2 Taxol reduced to half dose and CT was dose reduced starting with week 4 due to thrombocytopenia. Given pancytopenia, will change Carboplatin to 50% dose weekly with Taxol 22m/m2 starting with week 7 (06/13/20). Will hold Carboplatin as needed based on labs.   03/22/2020 Pathology Results   FINAL MICROSCOPIC DIAGNOSIS:   A. BREAST, RIGHT, PUNCH BIOPSY:  - Carcinoma involving dermal lymphatics.  - See comment.   B. BREAST, LEFT, PUNCH BIOPSY:  - Carcinoma involving dermal lymphatics.  - See comment.   COMMENT:  The morphology is compatible with ductal breast carcinoma.    ADDENDUM:   PROGNOSTIC INDICATOR RESULTS:   Immunohistochemical and morphometric analysis performed manually   The tumor cells are NEGATIVE for Her2 (0%).   Estrogen Receptor:       NEGATIVE, 0%  Progesterone Receptor:   NEGATIVE, 0%       CURRENT THERAPY:  AC q2weeks for 4 cycles 03/22/20-05/03/20 followed by weekly Taxoland Carboplatin q3weeksfor 12 weeks starting 05/17/20.Week 2 Taxol reduced  to half dose and CT was dose reduced starting with week 4 due to thrombocytopenia.Given pancytopenia, will change Carboplatin to 50% dose weekly with Taxol 37m/m2 starting with week 7 (06/13/20). Will hold Carboplatin as needed based on labs.   INTERVAL HISTORY:  NOKTOBER GLAZERis here for a follow up and treatment. She presents to the clinic alone. She notes she is doing well and stable. She is tolerating lower  dose carboplatin better. She denies nausea or diarrhea and her appetite and weight is trending up. She feels her breast mass feels the same. I reviewed medication list with her. She wonders if her breast masses are responding enough and if she should proceed with MRI and surgery sooner.    REVIEW OF SYSTEMS:   Constitutional: Denies fevers, chills or abnormal weight loss Eyes: Denies blurriness of vision Ears, nose, mouth, throat, and face: Denies mucositis or sore throat Respiratory: Denies cough, dyspnea or wheezes Cardiovascular: Denies palpitation, chest discomfort or lower extremity swelling Gastrointestinal:  Denies nausea, heartburn or change in bowel habits Skin: Denies abnormal skin rashes Lymphatics: Denies new lymphadenopathy or easy bruising Neurological:Denies numbness, tingling or new weaknesses Behavioral/Psych: Mood is stable, no new changes  All other systems were reviewed with the patient and are negative.  MEDICAL HISTORY:  Past Medical History:  Diagnosis Date  . Breast cancer ([facility] dx[place] 1993   left  . History of left breast cancer   . HIV infection ([place]   . Hypertension     SURGICAL HISTORY: Past Surgical History:  Procedure Laterality Date  . BREAST BIOPSY Bilateral 03/22/2020   Procedure: BILATERAL BREAST PUNCH BIOPSIES;  Surgeon: BStark Klein MD;  Location[facility]  Service: General;  Laterality: Bilateral;  . BREAST LUMPECTOMY    . CESAREAN SECTION    . left breast lumpectomy  1993  . PORTACATH PLACEMENT Right 03/22/2020   Procedure: INSERTION PORT-A-CATH WITH ULTRASOUND GUIDANCE;  Surgeon: BStark Klein MD;  [facility]  Service: General;  Laterality: Right;    I have reviewed the social history and family history with the patient and they are unchanged from previous note.  ALLERGIES:  is allergic to codeine, other, grapefruit bioflavonoid complex, pomegranate [punica], and shellfish allergy.  MEDICATIONS:  Current Outpatient Medications    Medication Sig Dispense Refill  . acetaminophen (TYLENOL) 500 MG tablet Take 1,000 mg by mouth every 6 (six) hours as needed for moderate pain. (Patient not taking: Reported on 06/28/2020)    . emtricitabine-rilpivir-tenofovir AF (ODEFSEY) 200-25-25 MG TABS tablet Take 1 tablet by mouth daily with breakfast. 30 tablet 11  . hydrochlorothiazide (HYDRODIURIL) 25 MG tablet Take 1 tablet (25 mg total) by mouth daily. 30 tablet 11  . lidocaine-prilocaine (EMLA) cream Apply to affected area once 30 g 3  . loratadine (CLARITIN) 10 MG tablet Take 10 mg by mouth daily. Takes 3-4 days before and after treatment    . ondansetron (ZOFRAN) 8 MG tablet Take 1 tablet (8 mg total) by mouth 2 (two) times daily as needed. Start on the third day after chemotherapy. (Patient not taking: Reported on 06/28/2020) 30 tablet 2  . oxyCODONE (OXY IR/ROXICODONE) 5 MG immediate release tablet Take 1 tablet (5 mg total) by mouth every 6 (six) hours as needed for severe pain. (Patient not taking: Reported on 06/28/2020) 15 tablet 0  . potassium chloride SA (KLOR-CON) 20 MEQ tablet Take 1 tablet (20 mEq total) by mouth 2 (two) times daily. Take 1 tab twice daily for 3 days then  1 tab once daily 60 tablet 1  . prochlorperazine (COMPAZINE) 10 MG tablet Take 1 tablet (10 mg total) by mouth every 6 (six) hours as needed (Nausea or vomiting). (Patient not taking: Reported on 06/28/2020) 30 tablet 2   No current facility-administered medications for this visit.    PHYSICAL EXAMINATION: ECOG PERFORMANCE STATUS: 1 - Symptomatic but completely ambulatory  Vitals:   07/12/20 0847  BP: 131/83  Pulse: 91  Resp: 20  Temp: 97.8 F (36.6 C)  SpO2: 100%   Filed Weights   07/12/20 0847  Weight: 142 lb 3.2 oz (64.5 kg)    GENERAL:alert, no distress and comfortable SKIN: skin color, texture, turgor are normal, no rashes or significant lesions EYES: normal, Conjunctiva are pink and non-injected, sclera clear  NECK: supple, thyroid  normal size, non-tender, without nodularity LYMPH:  no palpable lymphadenopathy in the cervical, axillary  LUNGS: clear to auscultation and percussion with normal breathing effort HEART: regular rate & rhythm and no murmurs and no lower extremity edema ABDOMEN:abdomen soft, non-tender and normal bowel sounds Musculoskeletal:no cyanosis of digits and no clubbing  NEURO: alert & oriented x 3 with fluent speech, no focal motor/sensory deficits BREAST:(+) Skin thickeningis softer andmeasuringnow 9x5.5cm (previously 8x10cm)of her left breast with hyperpigmentation, mostly stable without skin breakdown(+) Right breast palpable massis softernow 4x2.5cm (previously 5x3cm)in RUQ with skin erythema. (+) 2cm palpable right axillary LN, now 1.5-2cm(+) Right breast larger than left  LABORATORY DATA:  I have reviewed the data as listed CBC Latest Ref Rng & Units 07/12/2020 07/05/2020 06/28/2020  WBC 4.0 - 10.5 K/uL 3.3(L) 3.3(L) 2.7(L)  Hemoglobin 12.0 - 15.0 g/dL 8.7(L) 9.0(L) 9.5(L)  Hematocrit 36 - 46 % 26.8(L) 26.5(L) 28.5(L)  Platelets 150 - 400 K/uL 198 212 243     CMP Latest Ref Rng & Units 07/12/2020 07/05/2020 06/28/2020  Glucose 70 - 99 mg/dL 94 92 98  BUN 8 - 23 mg/dL _0 Creatinine 0.44 - 1.00 mg/dL 0.81 0.79 0.79  Sodium 135 - 145 mmol/L 139 135 136  Potassium 3.5 - 5.1 mmol/L 4.9 3.8 4.2  Chloride 98 - 111 mmol/L 112(H) 104 106  CO2 22 - 32 mmol/L 19(L) 24 24  Calcium 8.9 - 10.3 mg/dL 9.2 9.0 9.6  Total Protein 6.5 - 8.1 g/dL 7.0 7.0 7.0  Total Bilirubin 0.3 - 1.2 mg/dL 0.3 0.2(L) <0.2(L)  Alkaline Phos 38 - 126 U/L 132(H) 150(H) 157(H)  AST 15 - 41 U/L _1 ALT 0 - 44 U/L _2 RADIOGRAPHIC STUDIES: I have personally reviewed the radiological images as listed and agreed with the findings in the report. No results found.   ASSESSMENT & PLAN:  Victoria Coffey is a 64 y.o. female with    1.Left breast cancer,cT4N0Mx, inflammatory breast cancer, triple  negative,  andright breat cancer in [facility] withaxillary node metastasis, ERweakly+,PR/HER2-, Azucena Kuba -She was diagnosed with left breast cancer in 02/2020.she presented with inflammatory left breast cancer. 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  -further work up showed 9cm nodular area in right breast and skin biopsy confirmed cancer involvement -Staging scans were negative for distant metastasis -Istarted her on neoadjuvantchemotherapy as first-line treatmentwithAdriamycin and Cytoxanwhich she completed 4cycles5/21/21-05/03/20.She then proceeded with weekly Taxoland carboplatinevery 3 weeks. Due to poor tolerance carboplatin was changed to weekly dose  -S/p week 7 she continue to tolerate Taxol and lower dose carboplatin well. Labs reviewed,  WBC 3.3, Hg 8.7. Overall adequate to proceed with week 8 Taxol and carboplatin today with 30% carboplatin dose increase.  -Breast Exam today shows unchanged left breast and smaller right breast mass, indicating she is responding to treatment with no signs of disease progression. Her right mass is clinically now half it's initial size. I still plan to do MRI breast after chemo before proceeding with surgery. I encouraged her to continue chemo. She is agreeable.  -F/u next week.    2. Neutropenia  -Secondary to chemo -Carboplatin does has been reduced and neutropenia intermittent. Will hold Carboplatin as needed based on labs.  -Neutropenia has resolved in recent weeks, will increase carboplatin dose by 30% starting with C8.  -She is fine to proceed with Flu shot when available and I suggest proceeding with COVID booster after chemo.  3. Anemia, secondary to chemo -Will give blood transfusion for Hg <7, last on 05/27/20 -She is currently on prenatal vitamin. -Hg at 8.7 today (07/12/20)  4. Weight Loss, Low appetite -Her weight has been trending down.Iencouraged her to focuson  maintaining weight instead of losing weight while on chemo so she is strong enough to continue. -Due to taste change she has diet change. I encourage her to continue high calorie foods. She will continue to f/u with Dietician. -She is able to maintain weight and slowly trend up.   5. H/o Left breast cancer, Dx in 1993 s/p lumpectomy, radiation, chemo, Tamoxifen. Genetic Testinghas not been done yet.  6. Comorbidities: HIV(+)Dx 1993, HTN -Her HIV has been undetectable and well controlled. She is currently being seen by ID Dr Graylon Good  7.Mild transaminitis  -Unclear etiology, presents before chemotherapy.Her6/16/21 MRIabdomenwas benign. -Her LFTshave resolved (05/24/20)and she continues to have mildly elevated Alk Phos.  8. Hypokalemia  -Now resolved. She can continue oral potassium once daily now.   PLAN: -Labs reviewed and adequate to proceed with week 8 Taxol and Carboplatin with Carboplatin 30% dose increase to AUC 1.3.  -Lab, flush, F/uand chemoTaxol Carboplatin in 1, 2, 3, 4 weeks.  -will order breast MRI on next visit    No problem-specific Assessment & Plan notes found for this encounter.   No orders of the defined types were placed in this encounter.  All questions were answered. The patient knows to call the clinic with any problems, questions or concerns. No barriers to learning was detected. The total time spent in the appointment was 30 minutes.     [name], MD 07/12/2020   I, Joslyn Devon, am acting as scribe for [name], MD.   I have reviewed the above documentation for accuracy and completeness, and I agree with the above.

## 2020-07-12 ENCOUNTER — Inpatient Hospital Stay: Payer: BC Managed Care – PPO

## 2020-07-12 ENCOUNTER — Telehealth: Payer: Self-pay | Admitting: Hematology

## 2020-07-12 ENCOUNTER — Other Ambulatory Visit: Payer: Self-pay

## 2020-07-12 ENCOUNTER — Inpatient Hospital Stay (HOSPITAL_BASED_OUTPATIENT_CLINIC_OR_DEPARTMENT_OTHER): Payer: BC Managed Care – PPO | Admitting: Hematology

## 2020-07-12 ENCOUNTER — Encounter: Payer: Self-pay | Admitting: *Deleted

## 2020-07-12 ENCOUNTER — Encounter: Payer: Self-pay | Admitting: Hematology

## 2020-07-12 ENCOUNTER — Inpatient Hospital Stay: Payer: BC Managed Care – PPO | Admitting: Nutrition

## 2020-07-12 VITALS — BP 131/83 | HR 91 | Temp 97.8°F | Resp 20 | Ht 61.0 in | Wt 142.2 lb

## 2020-07-12 DIAGNOSIS — Z170 Estrogen receptor positive status [ER+]: Secondary | ICD-10-CM

## 2020-07-12 DIAGNOSIS — Z5111 Encounter for antineoplastic chemotherapy: Secondary | ICD-10-CM | POA: Diagnosis not present

## 2020-07-12 DIAGNOSIS — C50412 Malignant neoplasm of upper-outer quadrant of left female breast: Secondary | ICD-10-CM | POA: Diagnosis not present

## 2020-07-12 DIAGNOSIS — Z95828 Presence of other vascular implants and grafts: Secondary | ICD-10-CM

## 2020-07-12 LAB — CMP (CANCER CENTER ONLY)
ALT: 19 U/L (ref 0–44)
AST: 21 U/L (ref 15–41)
Albumin: 3.4 g/dL — ABNORMAL LOW (ref 3.5–5.0)
Alkaline Phosphatase: 132 U/L — ABNORMAL HIGH (ref 38–126)
Anion gap: 8 (ref 5–15)
BUN: 15 mg/dL (ref 8–23)
CO2: 19 mmol/L — ABNORMAL LOW (ref 22–32)
Calcium: 9.2 mg/dL (ref 8.9–10.3)
Chloride: 112 mmol/L — ABNORMAL HIGH (ref 98–111)
Creatinine: 0.81 mg/dL (ref 0.44–1.00)
GFR, Est AFR Am: >60 mL/min (ref >60)
GFR, Estimated: >60 mL/min (ref >60)
Glucose, Bld: 94 mg/dL (ref 70–99)
Potassium: 4.9 mmol/L (ref 3.5–5.1)
Sodium: 139 mmol/L (ref 135–145)
Total Bilirubin: 0.3 mg/dL (ref 0.3–1.2)
Total Protein: 7 g/dL (ref 6.5–8.1)

## 2020-07-12 LAB — CBC WITH DIFFERENTIAL (CANCER CENTER ONLY)
Abs Immature Granulocytes: 0.01 10*3/uL (ref 0.00–0.07)
Basophils Absolute: 0 10*3/uL (ref 0.0–0.1)
Basophils Relative: 1 %
Eosinophils Absolute: 0 10*3/uL (ref 0.0–0.5)
Eosinophils Relative: 0 %
HCT: 26.8 % — ABNORMAL LOW (ref 36.0–46.0)
Hemoglobin: 8.7 g/dL — ABNORMAL LOW (ref 12.0–15.0)
Immature Granulocytes: 0 %
Lymphocytes Relative: 35 %
Lymphs Abs: 1.2 10*3/uL (ref 0.7–4.0)
MCH: 30.2 pg (ref 26.0–34.0)
MCHC: 32.5 g/dL (ref 30.0–36.0)
MCV: 93.1 fL (ref 80.0–100.0)
Monocytes Absolute: 0.2 10*3/uL (ref 0.1–1.0)
Monocytes Relative: 7 %
Neutro Abs: 1.9 10*3/uL (ref 1.7–7.7)
Neutrophils Relative %: 57 %
Platelet Count: 198 10*3/uL (ref 150–400)
RBC: 2.88 MIL/uL — ABNORMAL LOW (ref 3.87–5.11)
RDW: 20 % — ABNORMAL HIGH (ref 11.5–15.5)
WBC Count: 3.3 10*3/uL — ABNORMAL LOW (ref 4.0–10.5)
nRBC: 0 % (ref 0.0–0.2)

## 2020-07-12 MED ORDER — SODIUM CHLORIDE 0.9 % IV SOLN
125.0000 mg | Freq: Once | INTRAVENOUS | Status: AC
  Administered 2020-07-12: 130 mg via INTRAVENOUS
  Filled 2020-07-12: qty 13

## 2020-07-12 MED ORDER — FAMOTIDINE IN NACL 20-0.9 MG/50ML-% IV SOLN
20.0000 mg | Freq: Once | INTRAVENOUS | Status: AC
  Administered 2020-07-12: 20 mg via INTRAVENOUS

## 2020-07-12 MED ORDER — DIPHENHYDRAMINE HCL 50 MG/ML IJ SOLN
INTRAMUSCULAR | Status: AC
  Filled 2020-07-12: qty 1

## 2020-07-12 MED ORDER — SODIUM CHLORIDE 0.9% FLUSH
10.0000 mL | INTRAVENOUS | Status: DC | PRN
  Administered 2020-07-12: 10 mL
  Filled 2020-07-12: qty 10

## 2020-07-12 MED ORDER — DIPHENHYDRAMINE HCL 50 MG/ML IJ SOLN
50.0000 mg | Freq: Once | INTRAMUSCULAR | Status: AC
  Administered 2020-07-12: 50 mg via INTRAVENOUS

## 2020-07-12 MED ORDER — SODIUM CHLORIDE 0.9 % IV SOLN
80.0000 mg/m2 | Freq: Once | INTRAVENOUS | Status: AC
  Administered 2020-07-12: 132 mg via INTRAVENOUS
  Filled 2020-07-12: qty 22

## 2020-07-12 MED ORDER — METHYLPREDNISOLONE SODIUM SUCC 125 MG IJ SOLR
INTRAMUSCULAR | Status: AC
  Filled 2020-07-12: qty 2

## 2020-07-12 MED ORDER — SODIUM CHLORIDE 0.9 % IV SOLN
Freq: Once | INTRAVENOUS | Status: AC
  Filled 2020-07-12: qty 250

## 2020-07-12 MED ORDER — SODIUM CHLORIDE 0.9% FLUSH
10.0000 mL | Freq: Once | INTRAVENOUS | Status: AC
  Administered 2020-07-12: 10 mL
  Filled 2020-07-12: qty 10

## 2020-07-12 MED ORDER — FAMOTIDINE IN NACL 20-0.9 MG/50ML-% IV SOLN
INTRAVENOUS | Status: AC
  Filled 2020-07-12: qty 50

## 2020-07-12 MED ORDER — HEPARIN SOD (PORK) LOCK FLUSH 100 UNIT/ML IV SOLN
500.0000 [IU] | Freq: Once | INTRAVENOUS | Status: AC | PRN
  Administered 2020-07-12: 500 [IU]
  Filled 2020-07-12: qty 5

## 2020-07-12 MED ORDER — METHYLPREDNISOLONE SODIUM SUCC 125 MG IJ SOLR
100.0000 mg | Freq: Once | INTRAMUSCULAR | Status: AC
  Administered 2020-07-12: 100 mg via INTRAVENOUS

## 2020-07-12 NOTE — Progress Notes (Signed)
Nutrition follow-up completed with patient during infusion for triple negative bilateral breast cancer.  She is receiving Publishing rights manager. Weight is stable at 142.2 pounds September 10. Labs were reviewed. Noted ECOG 1. Patient denies nausea and vomiting. Reports she is trying to consume more protein. Patient verbalizes no concerns today.  Nutrition diagnosis: Inadequate oral intake stable.  Intervention: Encourage patient to continue strategies for increasing calories and protein for weight maintenance. Recommended patient use high-protein almond milk to make smoothies. Provided contact information for questions.  Monitoring, evaluation, goals: Patient will continue to tolerate adequate calories and protein for weight maintenance.  Next visit: Friday, October 8 during infusion.  **Disclaimer: This note was dictated with voice recognition software. Similar sounding words can inadvertently be transcribed and this note may contain transcription errors which may not have been corrected upon publication of note.**

## 2020-07-12 NOTE — Patient Instructions (Signed)
[facility] °Discharge Instructions for Patients Receiving Chemotherapy ° °Today you received the following chemotherapy agents Taxol; Carboplatin ° °To help prevent nausea and vomiting after your treatment, we encourage you to take your nausea medication as directed °  °If you develop nausea and vomiting that is not controlled by your nausea medication, call the clinic.  ° °BELOW ARE SYMPTOMS THAT SHOULD BE REPORTED IMMEDIATELY: °· *FEVER GREATER THAN 100.5 F °· *CHILLS WITH OR WITHOUT FEVER °· NAUSEA AND VOMITING THAT IS NOT CONTROLLED WITH YOUR NAUSEA MEDICATION °· *UNUSUAL SHORTNESS OF BREATH °· *UNUSUAL BRUISING OR BLEEDING °· TENDERNESS IN MOUTH AND THROAT WITH OR WITHOUT PRESENCE OF ULCERS °· *URINARY PROBLEMS °· *BOWEL PROBLEMS °· UNUSUAL RASH °Items with * indicate a potential emergency and should be followed up as soon as possible. ° °Feel free to call the clinic should you have any questions or concerns. The clinic phone number is [phone]. ° °Please show the CHEMO ALERT CARD at check-in to the Emergency Department and triage nurse. ° ° °

## 2020-07-12 NOTE — Patient Instructions (Signed)

## 2020-07-12 NOTE — Telephone Encounter (Signed)
Scheduled appointment per 9/10 los. Patient is aware of appointments. I also gave her an updated calendar print out.

## 2020-07-17 NOTE — Progress Notes (Signed)
[place]   Telephone:([phone] Fax:([phone]) [phone]   Clinic Follow up Note   Patient Care Team: [name], MD as PCP - General (Infectious Diseases) [name], MD as PCP - Infectious Diseases (Infectious Diseases) [name], MD as Consulting Physician (General Surgery) [name], MD as Consulting Physician (Hematology) [name], MD as Consulting Physician (Radiation Oncology) [name], RN as Oncology Nurse Navigator [name], RN as Oncology Nurse Navigator  Date of Service:  07/19/2020  CHIEF COMPLAINT: F/u of left breast cancer  SUMMARY OF ONCOLOGIC HISTORY: Oncology History Overview Note  Cancer Staging Malignant neoplasm of upper-outer quadrant of left breast in female, estrogen receptor positive ([place]) Staging form: Breast, AJCC 8th Edition - Clinical stage from 03/01/2020: Stage IV (cT4, cN0, pM1, G3, ER+, PR-, HER2-) - Signed by [name], MD on 03/06/2020    Malignant neoplasm of upper-outer quadrant of left breast in female, estrogen receptor positive ([place])  02/28/2020 Mammogram   Diagnostic Mammogram 02/28/20  IMPRESSION The 3.9 cm ill defined mass in the left breast posterior depth central 4.3cm to the nipple seen on the mediolateral oblique view onlt with associated difssue skin thickening is highly suspicious for malignancy.    03/01/2020 Cancer Staging   Staging form: Breast, AJCC 8th Edition - Clinical stage from 03/01/2020: Stage IV (cT4, cN0, pM1, G3, ER+, PR-, HER2-) - Signed by [name], MD on 03/06/2020   03/01/2020 Initial Biopsy   Diagnosis 03/01/20  1. Breast, left, needle core biopsy, 9 o'clock, 3-4cmfn - INVASIVE DUCTAL CARCINOMA. SEE NOTE 2. Lymph node, needle/core biopsy, right axilla - INVASIVE DUCTAL CARCINOMA. SEE NOTE Diagnosis Note 1. Invasive carcinoma measures 1.5 cm in greatest linear dimension and appears grade 3. Dr. [name] reviewed the case and concurs with the diagnosis. A breast prognostic  profile (ER, PR, Ki-67 and HER2) is pending and will be reported in an addendum. Dr. [name] was notified on 03/04/2020. 2. Carcinoma measures 0.6 cm in greatest linear dimension and appears grade 3. Lymphoid tissue is not identified - this may represent a completely replaced lymph node. A breast prognostic profile (ER, PR, Ki-67 and HER2) is pending and will be reported in an addendum. Dr. [name] reviewed the case and concurs with the diagnosis. Dr. [name] was notified on 03/04/2020.   03/01/2020 Receptors her2   1. PROGNOSTIC INDICATORS Results: IMMUNOHISTOCHEMICAL AND MORPHOMETRIC ANALYSIS PERFORMED MANUALLY The tumor cells are NEGATIVE for Her2 (0). Estrogen Receptor: 20%, POSITIVE, WEAK STAINING INTENSITY Progesterone Receptor: 0%, NEGATIVE Proliferation Marker Ki67: 40% COMMENT: The negative hormone receptor study(ies) in this case has an internal positive control.    2. PROGNOSTIC INDICATORS Results: IMMUNOHISTOCHEMICAL AND MORPHOMETRIC ANALYSIS PERFORMED MANUALLY The tumor cells are NEGATIVE for Her2 (1+). Estrogen Receptor: 10%, POSITIVE, WEAK STAINING INTENSITY Progesterone Receptor: 0%, NEGATIVE Proliferation Marker Ki67: 40% COMMENT: The negative hormone receptor study(ies) in this case has no internal positive control.   03/05/2020 Initial Diagnosis   Malignant neoplasm of upper-outer quadrant of left breast in female, estrogen receptor positive ([place])   03/18/2020 Breast MRI   IMPRESSION: 1. 7 x 9 x 6 cm area of suspicious non masslike enhancement within the central/UPPER RIGHT breast with anterior skin thickening. Given biopsy-proven metastatic RIGHT axillary lymph node, tissue sampling of the anterior and posterior aspects of this non masslike RIGHT breast enhancement is recommended to exclude malignancy. 2. 7 x 6 x 5 cm diffuse masslike and nonmasslike enhancement within the majority of the remaining LEFT breast with diffuse LEFT breast  skin thickening, compatible  with malignancy. Abnormal enhancement is noted along the anterior aspect of the LEFT pectoralis muscle. 3. Three abnormal RIGHT axillary lymph nodes, 1 which is biopsy proven to be metastatic disease. No abnormal LEFT axillary lymph nodes identified.   03/20/2020 Echocardiogram   Baseline ECHO  IMPRESSIONS     1. The patient was reported to be in pain during the study and not able  to participate in this study. All that can be said is that the LV and RV  function are grossly normal. Would recommend to repeat this study when/if  the patient is able to tolerate  the exam.   2. Left ventricular ejection fraction, by estimation, is 60 to 65%. The  left ventricle has normal function. Left ventricular endocardial border  not optimally defined to evaluate regional wall motion. Left ventricular  diastolic function could not be  evaluated.   3. Right ventricular systolic function is normal. The right ventricular  size is normal.   4. The mitral valve was not well visualized. No evidence of mitral valve  regurgitation.   5. The aortic valve was not well visualized. Aortic valve regurgitation  is not visualized.   6. The inferior vena cava is normal in size with greater than 50%  respiratory variability, suggesting right atrial pressure of 3 mmHg.    03/20/2020 Imaging   CT CAP W contrast  IMPRESSION: 1. Small right axillary/subpectoral lymph nodes. Difficult to exclude metastatic disease. 2. Subtle 6 mm low-attenuation lesion in the dome of the liver, not well seen previously. Lesion is too small to characterize. Further evaluation could be performed with MR abdomen without and with contrast, as clinically indicated. 3. Hepatic steatosis. 4. Aortic atherosclerosis (ICD10-I70.0). Coronary artery calcification.     03/20/2020 Imaging   Whole body bone scan  IMPRESSION: 1. Increased activity noted over the right breast, possibly related to the patient's right breast cancer.   2.  Punctate area of increased activity over the left maxillary region, most likely related to sinus disease or dental disease. Increased activity noted over the left mandible also possibly related to dental disease. No other focal bony abnormalities to suggest metastatic disease identified.   03/22/2020 Procedure   PAC placement by Dr [name]    03/22/2020 -  Chemotherapy   AC q2weeks for 4 cycles 03/22/20-05/03/20 followed by weekly Taxol and Carboplatin q3weeks for 12 weeks starting 05/17/20.               Week 2 Taxol reduced to half dose and CT was dose reduced starting with week 4 due to thrombocytopenia. Given pancytopenia, will change Carboplatin to 50% dose weekly with Taxol 21m/m2 starting with week 7 (06/13/20). Will hold Carboplatin as needed based on labs. Carboplatin increased to 80% starting with week 8.    03/22/2020 Pathology Results   FINAL MICROSCOPIC DIAGNOSIS:   A. BREAST, RIGHT, PUNCH BIOPSY:  - Carcinoma involving dermal lymphatics.  - See comment.   B. BREAST, LEFT, PUNCH BIOPSY:  - Carcinoma involving dermal lymphatics.  - See comment.   COMMENT:  The morphology is compatible with ductal breast carcinoma.    ADDENDUM:   PROGNOSTIC INDICATOR RESULTS:   Immunohistochemical and morphometric analysis performed manually   The tumor cells are NEGATIVE for Her2 (0%).   Estrogen Receptor:       NEGATIVE, 0%  Progesterone Receptor:   NEGATIVE, 0%       CURRENT THERAPY:  AC q2weeks for 4 cycles 03/22/20-05/03/20 followed by weekly  Taxoland Carboplatin q3weeksfor 12 weeks starting 05/17/20.Week 2 Taxol reduced to half dose and CT was dose reduced starting with week 4 due to thrombocytopenia.Given pancytopenia,will change Carboplatin to 50% dose weekly with Taxol 11m/m2starting with week 7 (06/13/20). Will hold Carboplatin as needed based on labs. Carboplatin increased to 80% starting with week 8.   INTERVAL HISTORY:  Victoria MADRUGAis here for a follow up and  treatment. She presents to the clinic alone. She notes she tolerated the dose increase of carboplatin. She notes she has remained stable. She notes her left breast recently feels itchy. She denies skin rash, but feel this irritation internally. She denies true pain.     REVIEW OF SYSTEMS:   Constitutional: Denies fevers, chills or abnormal weight loss Eyes: Denies blurriness of vision Ears, nose, mouth, throat, and face: Denies mucositis or sore throat Respiratory: Denies cough, dyspnea or wheezes Cardiovascular: Denies palpitation, chest discomfort or lower extremity swelling Gastrointestinal:  Denies nausea, heartburn or change in bowel habits Skin: Denies abnormal skin rashes (+) Left breast itching.  Lymphatics: Denies new lymphadenopathy or easy bruising Neurological:Denies numbness, tingling or new weaknesses Behavioral/Psych: Mood is stable, no new changes  All other systems were reviewed with the patient and are negative.  MEDICAL HISTORY:  Past Medical History:  Diagnosis Date  . [place]   left  . History of left breast cancer   . HIV infection ([facility]   . Hypertension     SURGICAL HISTORY: Past Surgical History:  Procedure Laterality Date  . BREAST BIOPSY Bilateral 03/22/2020   Procedure: BILATERAL BREAST PUNCH BIOPSIES;  Surgeon: BStark Klein MD;  [place] [facility]  Service: General;  Laterality: Bilateral;  . BREAST LUMPECTOMY    . CESAREAN SECTION    . left breast lumpectomy  1993  . PORTACATH PLACEMENT Right 03/22/2020   Procedure: INSERTION PORT-A-CATH WITH ULTRASOUND GUIDANCE;  Surgeon: BStark Klein MD;  [place] [facility]  Service: General;  Laterality: Right;    I have reviewed the social history and family history with the patient and they are unchanged from previous note.  ALLERGIES:  is allergic to codeine, other, grapefruit bioflavonoid complex, pomegranate [punica], and shellfish allergy.  MEDICATIONS:  Current Outpatient  Medications  Medication Sig Dispense Refill  . emtricitabine-rilpivir-tenofovir AF (ODEFSEY) 200-25-25 MG TABS tablet Take 1 tablet by mouth daily with breakfast. 30 tablet 11  . hydrochlorothiazide (HYDRODIURIL) 25 MG tablet Take 1 tablet (25 mg total) by mouth daily. 30 tablet 11  . lidocaine-prilocaine (EMLA) cream Apply to affected area once 30 g 3  . loratadine (CLARITIN) 10 MG tablet Take 10 mg by mouth daily. Takes 3-4 days before and after treatment    . potassium chloride SA (KLOR-CON) 20 MEQ tablet Take 1 tablet (20 mEq total) by mouth 2 (two) times daily. Take 1 tab twice daily for 3 days then 1 tab once daily 60 tablet 1  . prochlorperazine (COMPAZINE) 10 MG tablet Take 1 tablet (10 mg total) by mouth every 6 (six) hours as needed (Nausea or vomiting). 30 tablet 2  . acetaminophen (TYLENOL) 500 MG tablet Take 1,000 mg by mouth every 6 (six) hours as needed for moderate pain. (Patient not taking: Reported on 06/28/2020)    . ondansetron (ZOFRAN) 8 MG tablet Take 1 tablet (8 mg total) by mouth 2 (two) times daily as needed. Start on the third day after chemotherapy. (Patient not taking: Reported on 06/28/2020) 30 tablet 2  . oxyCODONE (OXY IR/ROXICODONE) 5  MG immediate release tablet Take 1 tablet (5 mg total) by mouth every 6 (six) hours as needed for severe pain. (Patient not taking: Reported on 06/28/2020) 15 tablet 0   No current facility-administered medications for this visit.   Facility-Administered Medications Ordered in Other Visits  Medication Dose Route Frequency Provider Last Rate Last Admin  . CARBOplatin (PARAPLATIN) 140 mg in sodium chloride 0.9 % 100 mL chemo infusion  140 mg Intravenous Once [name], MD      . heparin lock flush 100 unit/mL  500 Units Intracatheter Once PRN [name], MD      . PACLitaxel (TAXOL) 132 mg in sodium chloride 0.9 % 250 mL chemo infusion (</= 60m/m2)  80 mg/m2 (Order-Specific) Intravenous Once FTruitt Merle MD      . sodium chloride flush (NS)  0.9 % injection 10 mL  10 mL Intracatheter PRN FTruitt Merle MD        PHYSICAL EXAMINATION: ECOG PERFORMANCE STATUS: 1 - Symptomatic but completely ambulatory  Vitals:   07/19/20 0842  BP: 131/79  Pulse: 94  Resp: 19  Temp: 97.8 F (36.6 C)  SpO2: 100%   Filed Weights   07/19/20 0842  Weight: 139 lb 11.2 oz (63.4 kg)    GENERAL:alert, no distress and comfortable SKIN: skin color, texture, turgor are normal, no rashes or significant lesions EYES: normal, Conjunctiva are pink and non-injected, sclera clear  NECK: supple, thyroid normal size, non-tender, without nodularity LYMPH:  no palpable lymphadenopathy in the cervical, axillary  LUNGS: clear to auscultation and percussion with normal breathing effort HEART: regular rate & rhythm and no murmurs and no lower extremity edema ABDOMEN:abdomen soft, non-tender and normal bowel sounds Musculoskeletal:no cyanosis of digits and no clubbing  NEURO: alert & oriented x 3 with fluent speech, no focal motor/sensory deficits BREAST:(+) Skin thickeningis softer andmeasuringnow 7.9cm (previously 9x5.5cm)of her left breast with hyperpigmentation, mostly stable without skin breakdown(+) Right breast palpable massis softernow 5x3cm (previously 4x2.5cm)in RUQ with skin erythema. (+) 2cm palpable right axillary LN, now 1.5-2cm and softer(+) Right breast larger than left  LABORATORY DATA:  I have reviewed the data as listed CBC Latest Ref Rng & Units 07/19/2020 07/12/2020 07/05/2020  WBC 4.0 - 10.5 K/uL 3.2(L) 3.3(L) 3.3(L)  Hemoglobin 12.0 - 15.0 g/dL 8.4(L) 8.7(L) 9.0(L)  Hematocrit 36 - 46 % 25.4(L) 26.8(L) 26.5(L)  Platelets 150 - 400 K/uL 180 198 212     CMP Latest Ref Rng & Units 07/19/2020 07/12/2020 07/05/2020  Glucose 70 - 99 mg/dL 102(H) 94 92  BUN 8 - 23 mg/dL _0 Creatinine 0.44 - 1.00 mg/dL 0.83 0.81 0.79  Sodium 135 - 145 mmol/L 137 139 135  Potassium 3.5 - 5.1 mmol/L 4.0 4.9 3.8  Chloride 98 - 111 mmol/L 107 112(H)  104  CO2 22 - 32 mmol/L 21(L) 19(L) 24  Calcium 8.9 - 10.3 mg/dL 9.0 9.2 9.0  Total Protein 6.5 - 8.1 g/dL 7.1 7.0 7.0  Total Bilirubin 0.3 - 1.2 mg/dL 0.3 0.3 0.2(L)  Alkaline Phos 38 - 126 U/L 139(H) 132(H) 150(H)  AST 15 - 41 U/L _1 ALT 0 - 44 U/L _2 RADIOGRAPHIC STUDIES: I have personally reviewed the radiological images as listed and agreed with the findings in the report. No results found.   ASSESSMENT & PLAN:  Victoria MEADERSis a 64y.o. female with    1.Left breast cancer,cT4N0Mx, inflammatory breast cancer, triple negative,  andright breat cancer in [facility] withaxillary node metastasis, ERweakly+,PR/HER2-, Azucena Kuba -She was diagnosed with left breast cancer in 02/2020.she presented with inflammatory left breast cancer. 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  -further work up showed 9cm nodular area in right breast and skin biopsy confirmed cancer involvement -Staging scans were negative for distant metastasis -Istarted her on neoadjuvantchemotherapy as first-line treatmentwithAdriamycin and Cytoxanwhich she completed 4cycles5/21/21-05/03/20.She then proceeded with weekly Taxoland carboplatinevery 3 weeks starting 05/17/20. Due to poor tolerance carboplatin was changed to weekly with dose reduction.  -S/p week 8 with slight carbo dose increase and she tolerated well. She remains stable.  -Labs reviewed, WBC 3.2, Hg 8.4, BG 102, alk phos 139. Overall adequate to proceed with week 9 TC at same dose.  -Will proceed with Breast MRI week of 08/13/20 before follow up with surgeon Dr [name].  -F/u next week.   2. Neutropenia -Secondary to chemo -Carboplatin does has been reduced and neutropenia intermittent. Will hold Carboplatin as needed based on labs.  -Neutropenia has resolved in recent weeks, so I increase carboplatin dose by 30% starting with C8.  -She is fine to proceed with Flu shot when  available and I suggest proceeding with COVID booster after chemo.  3. Anemia, secondary to chemo -Will give blood transfusion for Hg <7, last on 05/27/20 -She is currently on prenatal vitamin. -Hg at 8.4 today (07/19/20). She is not symptomatic. If further drops will give blood transfusion.   4. Weight Loss, Low appetite -Her weight has been trending down.Iencouraged her to focuson maintaining weight instead of losing weight while on chemo so she is strong enough to continue. -Due to taste change she has diet change. I encourage her to continue high calorie foods. She will continue to f/u with Dietician.  -Weight mostly stable now, with fluctuation.   5. H/o Left breast cancer, Dx in 1993 s/p lumpectomy, radiation, chemo, Tamoxifen. Genetic Testinghas not been done yet.  6. Comorbidities: HIV(+)Dx 1993, HTN -Her HIV has been undetectable and well controlled. She is currently being seen by ID Dr Graylon Good  7.Mild transaminitis  -Unclear etiology, presents before chemotherapy.Her6/16/21 MRIabdomenwas benign. -Her LFTshave resolved (05/24/20)and she continues to have mildly elevated Alk Phos.  8. Hypokalemia (Resolved)  -She can continue oral potassium once daily now.   PLAN: -Labs reviewed and adequate to proceed with week 9 Taxol and Carboplatin at same dose  -Lab, flush, F/uand chemoTaxol Carboplatin in 1, 2, 3 weeks.  -MRI breast week of 08/13/20, see Dr. [name] after MRI    No problem-specific Assessment & Plan notes found for this encounter.   Orders Placed This Encounter  Procedures  . MR BREAST BILATERAL W WO CONTRAST INC CAD    Standing Status:   Future    Standing Expiration Date:   07/19/2021    Order Specific Question:   If indicated for the ordered procedure, I authorize the administration of contrast media per Radiology protocol    Answer:   Yes    Order Specific Question:   What is the patient's sedation requirement?    Answer:   No Sedation     Order Specific Question:   Does the patient have a pacemaker or implanted devices?    Answer:   No    Order Specific Question:   Radiology Contrast Protocol - do NOT remove file path    Answer:   [email or web address]    Order Specific Question:   Preferred imaging location?  Answer:   [facility] (table limit - 550 lbs)   All questions were answered. The patient knows to call the clinic with any problems, questions or concerns. No barriers to learning was detected. The total time spent in the appointment was 30 minutes.     [name], MD 07/19/2020   I, Joslyn Devon, am acting as scribe for [name], MD.   I have reviewed the above documentation for accuracy and completeness, and I agree with the above.

## 2020-07-19 ENCOUNTER — Encounter: Payer: Self-pay | Admitting: Hematology

## 2020-07-19 ENCOUNTER — Encounter: Payer: Self-pay | Admitting: *Deleted

## 2020-07-19 ENCOUNTER — Inpatient Hospital Stay (HOSPITAL_BASED_OUTPATIENT_CLINIC_OR_DEPARTMENT_OTHER): Payer: BC Managed Care – PPO | Admitting: Hematology

## 2020-07-19 ENCOUNTER — Other Ambulatory Visit: Payer: Self-pay

## 2020-07-19 ENCOUNTER — Inpatient Hospital Stay: Payer: BC Managed Care – PPO

## 2020-07-19 VITALS — BP 131/79 | HR 94 | Temp 97.8°F | Resp 19 | Ht 61.0 in | Wt 139.7 lb

## 2020-07-19 DIAGNOSIS — Z170 Estrogen receptor positive status [ER+]: Secondary | ICD-10-CM | POA: Diagnosis not present

## 2020-07-19 DIAGNOSIS — C50412 Malignant neoplasm of upper-outer quadrant of left female breast: Secondary | ICD-10-CM

## 2020-07-19 DIAGNOSIS — Z5111 Encounter for antineoplastic chemotherapy: Secondary | ICD-10-CM | POA: Diagnosis not present

## 2020-07-19 DIAGNOSIS — B20 Human immunodeficiency virus [HIV] disease: Secondary | ICD-10-CM

## 2020-07-19 DIAGNOSIS — Z95828 Presence of other vascular implants and grafts: Secondary | ICD-10-CM

## 2020-07-19 LAB — CBC WITH DIFFERENTIAL (CANCER CENTER ONLY)
Abs Immature Granulocytes: 0.01 10*3/uL (ref 0.00–0.07)
Basophils Absolute: 0 10*3/uL (ref 0.0–0.1)
Basophils Relative: 0 %
Eosinophils Absolute: 0 10*3/uL (ref 0.0–0.5)
Eosinophils Relative: 1 %
HCT: 25.4 % — ABNORMAL LOW (ref 36.0–46.0)
Hemoglobin: 8.4 g/dL — ABNORMAL LOW (ref 12.0–15.0)
Immature Granulocytes: 0 %
Lymphocytes Relative: 34 %
Lymphs Abs: 1.1 10*3/uL (ref 0.7–4.0)
MCH: 31 pg (ref 26.0–34.0)
MCHC: 33.1 g/dL (ref 30.0–36.0)
MCV: 93.7 fL (ref 80.0–100.0)
Monocytes Absolute: 0.3 10*3/uL (ref 0.1–1.0)
Monocytes Relative: 9 %
Neutro Abs: 1.8 10*3/uL (ref 1.7–7.7)
Neutrophils Relative %: 56 %
Platelet Count: 180 10*3/uL (ref 150–400)
RBC: 2.71 MIL/uL — ABNORMAL LOW (ref 3.87–5.11)
RDW: 20.3 % — ABNORMAL HIGH (ref 11.5–15.5)
WBC Count: 3.2 10*3/uL — ABNORMAL LOW (ref 4.0–10.5)
nRBC: 0 % (ref 0.0–0.2)

## 2020-07-19 LAB — CMP (CANCER CENTER ONLY)
ALT: 21 U/L (ref 0–44)
AST: 24 U/L (ref 15–41)
Albumin: 3.5 g/dL (ref 3.5–5.0)
Alkaline Phosphatase: 139 U/L — ABNORMAL HIGH (ref 38–126)
Anion gap: 9 (ref 5–15)
BUN: 14 mg/dL (ref 8–23)
CO2: 21 mmol/L — ABNORMAL LOW (ref 22–32)
Calcium: 9 mg/dL (ref 8.9–10.3)
Chloride: 107 mmol/L (ref 98–111)
Creatinine: 0.83 mg/dL (ref 0.44–1.00)
GFR, Est AFR Am: >60 mL/min (ref >60)
GFR, Estimated: >60 mL/min (ref >60)
Glucose, Bld: 102 mg/dL — ABNORMAL HIGH (ref 70–99)
Potassium: 4 mmol/L (ref 3.5–5.1)
Sodium: 137 mmol/L (ref 135–145)
Total Bilirubin: 0.3 mg/dL (ref 0.3–1.2)
Total Protein: 7.1 g/dL (ref 6.5–8.1)

## 2020-07-19 MED ORDER — DIPHENHYDRAMINE HCL 50 MG/ML IJ SOLN
INTRAMUSCULAR | Status: AC
  Filled 2020-07-19: qty 1

## 2020-07-19 MED ORDER — FAMOTIDINE IN NACL 20-0.9 MG/50ML-% IV SOLN
INTRAVENOUS | Status: AC
  Filled 2020-07-19: qty 50

## 2020-07-19 MED ORDER — METHYLPREDNISOLONE SODIUM SUCC 125 MG IJ SOLR
100.0000 mg | Freq: Once | INTRAMUSCULAR | Status: AC
  Administered 2020-07-19: 100 mg via INTRAVENOUS

## 2020-07-19 MED ORDER — SODIUM CHLORIDE 0.9% FLUSH
10.0000 mL | Freq: Once | INTRAVENOUS | Status: DC
  Filled 2020-07-19: qty 10

## 2020-07-19 MED ORDER — METHYLPREDNISOLONE SODIUM SUCC 125 MG IJ SOLR
INTRAMUSCULAR | Status: AC
  Filled 2020-07-19: qty 2

## 2020-07-19 MED ORDER — SODIUM CHLORIDE 0.9 % IV SOLN
80.0000 mg/m2 | Freq: Once | INTRAVENOUS | Status: AC
  Administered 2020-07-19: 132 mg via INTRAVENOUS
  Filled 2020-07-19: qty 22

## 2020-07-19 MED ORDER — SODIUM CHLORIDE 0.9% FLUSH
10.0000 mL | Freq: Once | INTRAVENOUS | Status: AC
  Administered 2020-07-19: 10 mL
  Filled 2020-07-19: qty 10

## 2020-07-19 MED ORDER — SODIUM CHLORIDE 0.9 % IV SOLN
139.6200 mg | Freq: Once | INTRAVENOUS | Status: AC
  Administered 2020-07-19: 140 mg via INTRAVENOUS
  Filled 2020-07-19: qty 14

## 2020-07-19 MED ORDER — FAMOTIDINE IN NACL 20-0.9 MG/50ML-% IV SOLN
20.0000 mg | Freq: Once | INTRAVENOUS | Status: AC
  Administered 2020-07-19: 20 mg via INTRAVENOUS

## 2020-07-19 MED ORDER — DIPHENHYDRAMINE HCL 50 MG/ML IJ SOLN
50.0000 mg | Freq: Once | INTRAMUSCULAR | Status: AC
  Administered 2020-07-19: 50 mg via INTRAVENOUS

## 2020-07-19 MED ORDER — HEPARIN SOD (PORK) LOCK FLUSH 100 UNIT/ML IV SOLN
500.0000 [IU] | Freq: Once | INTRAVENOUS | Status: AC | PRN
  Administered 2020-07-19: 500 [IU]
  Filled 2020-07-19: qty 5

## 2020-07-19 MED ORDER — SODIUM CHLORIDE 0.9% FLUSH
10.0000 mL | INTRAVENOUS | Status: DC | PRN
  Administered 2020-07-19: 10 mL
  Filled 2020-07-19: qty 10

## 2020-07-19 MED ORDER — SODIUM CHLORIDE 0.9 % IV SOLN
Freq: Once | INTRAVENOUS | Status: AC
  Filled 2020-07-19: qty 250

## 2020-07-19 NOTE — Patient Instructions (Signed)

## 2020-07-19 NOTE — Patient Instructions (Signed)
[facility] °Discharge Instructions for Patients Receiving Chemotherapy ° °Today you received the following chemotherapy agents Taxol; Carboplatin ° °To help prevent nausea and vomiting after your treatment, we encourage you to take your nausea medication as directed °  °If you develop nausea and vomiting that is not controlled by your nausea medication, call the clinic.  ° °BELOW ARE SYMPTOMS THAT SHOULD BE REPORTED IMMEDIATELY: °· *FEVER GREATER THAN 100.5 F °· *CHILLS WITH OR WITHOUT FEVER °· NAUSEA AND VOMITING THAT IS NOT CONTROLLED WITH YOUR NAUSEA MEDICATION °· *UNUSUAL SHORTNESS OF BREATH °· *UNUSUAL BRUISING OR BLEEDING °· TENDERNESS IN MOUTH AND THROAT WITH OR WITHOUT PRESENCE OF ULCERS °· *URINARY PROBLEMS °· *BOWEL PROBLEMS °· UNUSUAL RASH °Items with * indicate a potential emergency and should be followed up as soon as possible. ° °Feel free to call the clinic should you have any questions or concerns. The clinic phone number is [phone]. ° °Please show the CHEMO ALERT CARD at check-in to the Emergency Department and triage nurse. ° ° °

## 2020-07-23 ENCOUNTER — Telehealth: Payer: Self-pay | Admitting: *Deleted

## 2020-07-23 NOTE — Telephone Encounter (Signed)
Called pt and left detailed msg for breast MRI on 10/12 at 7:30am at [facility]. Contact information provided for questions or needs.

## 2020-07-24 ENCOUNTER — Other Ambulatory Visit: Payer: Self-pay

## 2020-07-24 ENCOUNTER — Telehealth: Payer: Self-pay

## 2020-07-24 DIAGNOSIS — Z79899 Other long term (current) drug therapy: Secondary | ICD-10-CM

## 2020-07-24 DIAGNOSIS — B20 Human immunodeficiency virus [HIV] disease: Secondary | ICD-10-CM

## 2020-07-24 NOTE — Progress Notes (Signed)
[facility]   [phone] [phone]   Clinic Follow up Note   Patient Care Team: [name], MD as PCP - General (Infectious Diseases) [name], MD as PCP - Infectious Diseases (Infectious Diseases) [name], MD as Consulting Physician (General Surgery) [name], MD as Consulting Physician (Hematology) [name], MD as Consulting Physician (Radiation Oncology) [name], RN as Oncology Nurse Navigator [name], RN as Oncology Nurse Navigator  Date of Service:  07/26/2020  CHIEF COMPLAINT: F/u of left breast cancer  SUMMARY OF ONCOLOGIC HISTORY: Oncology History Overview Note  Cancer Staging Malignant neoplasm of upper-outer quadrant of left breast in female, estrogen receptor positive ([place]) Staging form: Breast, AJCC 8th Edition - Clinical stage from 03/01/2020: Stage IV (cT4, cN0, pM1, G3, ER+, PR-, HER2-) - Signed by [name], MD on 03/06/2020    Malignant neoplasm of upper-outer quadrant of left breast in female, estrogen receptor positive ([place])  02/28/2020 Mammogram   Diagnostic Mammogram 02/28/20  IMPRESSION The 3.9 cm ill defined mass in the left breast posterior depth central 4.3cm to the nipple seen on the mediolateral oblique view onlt with associated difssue skin thickening is highly suspicious for malignancy.    03/01/2020 Cancer Staging   Staging form: Breast, AJCC 8th Edition - Clinical stage from 03/01/2020: Stage IV (cT4, cN0, pM1, G3, ER+, PR-, HER2-) - Signed by [name], MD on 03/06/2020   03/01/2020 Initial Biopsy   Diagnosis 03/01/20  1. Breast, left, needle core biopsy, 9 o'clock, 3-4cmfn - INVASIVE DUCTAL CARCINOMA. SEE NOTE 2. Lymph node, needle/core biopsy, right axilla - INVASIVE DUCTAL CARCINOMA. SEE NOTE Diagnosis Note 1. Invasive carcinoma measures 1.5 cm in greatest linear dimension and appears grade 3. Dr. [name] reviewed the case and concurs with the diagnosis. A breast prognostic  profile (ER, PR, Ki-67 and HER2) is pending and will be reported in an addendum. Dr. [name] was notified on 03/04/2020. 2. Carcinoma measures 0.6 cm in greatest linear dimension and appears grade 3. Lymphoid tissue is not identified - this may represent a completely replaced lymph node. A breast prognostic profile (ER, PR, Ki-67 and HER2) is pending and will be reported in an addendum. Dr. [name] reviewed the case and concurs with the diagnosis. Dr. [name] was notified on 03/04/2020.   03/01/2020 Receptors her2   1. PROGNOSTIC INDICATORS Results: IMMUNOHISTOCHEMICAL AND MORPHOMETRIC ANALYSIS PERFORMED MANUALLY The tumor cells are NEGATIVE for Her2 (0). Estrogen Receptor: 20%, POSITIVE, WEAK STAINING INTENSITY Progesterone Receptor: 0%, NEGATIVE Proliferation Marker Ki67: 40% COMMENT: The negative hormone receptor study(ies) in this case has an internal positive control.    2. PROGNOSTIC INDICATORS Results: IMMUNOHISTOCHEMICAL AND MORPHOMETRIC ANALYSIS PERFORMED MANUALLY The tumor cells are NEGATIVE for Her2 (1+). Estrogen Receptor: 10%, POSITIVE, WEAK STAINING INTENSITY Progesterone Receptor: 0%, NEGATIVE Proliferation Marker Ki67: 40% COMMENT: The negative hormone receptor study(ies) in this case has no internal positive control.   03/05/2020 Initial Diagnosis   Malignant neoplasm of upper-outer quadrant of left breast in female, estrogen receptor positive ([place])   03/18/2020 Breast MRI   IMPRESSION: 1. 7 x 9 x 6 cm area of suspicious non masslike enhancement within the central/UPPER RIGHT breast with anterior skin thickening. Given biopsy-proven metastatic RIGHT axillary lymph node, tissue sampling of the anterior and posterior aspects of this non masslike RIGHT breast enhancement is recommended to exclude malignancy. 2. 7 x 6 x 5 cm diffuse masslike and nonmasslike enhancement within the majority of the remaining LEFT breast with diffuse LEFT breast  skin thickening, compatible  with malignancy. Abnormal enhancement is noted along the anterior aspect of the LEFT pectoralis muscle. 3. Three abnormal RIGHT axillary lymph nodes, 1 which is biopsy proven to be metastatic disease. No abnormal LEFT axillary lymph nodes identified.   03/20/2020 Echocardiogram   Baseline ECHO  IMPRESSIONS     1. The patient was reported to be in pain during the study and not able  to participate in this study. All that can be said is that the LV and RV  function are grossly normal. Would recommend to repeat this study when/if  the patient is able to tolerate  the exam.   2. Left ventricular ejection fraction, by estimation, is 60 to 65%. The  left ventricle has normal function. Left ventricular endocardial border  not optimally defined to evaluate regional wall motion. Left ventricular  diastolic function could not be  evaluated.   3. Right ventricular systolic function is normal. The right ventricular  size is normal.   4. The mitral valve was not well visualized. No evidence of mitral valve  regurgitation.   5. The aortic valve was not well visualized. Aortic valve regurgitation  is not visualized.   6. The inferior vena cava is normal in size with greater than 50%  respiratory variability, suggesting right atrial pressure of 3 mmHg.    03/20/2020 Imaging   CT CAP W contrast  IMPRESSION: 1. Small right axillary/subpectoral lymph nodes. Difficult to exclude metastatic disease. 2. Subtle 6 mm low-attenuation lesion in the dome of the liver, not well seen previously. Lesion is too small to characterize. Further evaluation could be performed with MR abdomen without and with contrast, as clinically indicated. 3. Hepatic steatosis. 4. Aortic atherosclerosis (ICD10-I70.0). Coronary artery calcification.     03/20/2020 Imaging   Whole body bone scan  IMPRESSION: 1. Increased activity noted over the right breast, possibly related to the patient's right breast cancer.   2.  Punctate area of increased activity over the left maxillary region, most likely related to sinus disease or dental disease. Increased activity noted over the left mandible also possibly related to dental disease. No other focal bony abnormalities to suggest metastatic disease identified.   03/22/2020 Procedure   PAC placement by Dr [name]    03/22/2020 -  Chemotherapy   AC q2weeks for 4 cycles 03/22/20-05/03/20 followed by weekly Taxol and Carboplatin q3weeks for 12 weeks starting 05/17/20.               Week 2 Taxol reduced to half dose and CT was dose reduced starting with week 4 due to thrombocytopenia. Given pancytopenia, will change Carboplatin to 50% dose weekly with Taxol 21m/m2 starting with week 7 (06/13/20). Will hold Carboplatin as needed based on labs. Carboplatin increased to 80% starting with week 8.    03/22/2020 Pathology Results   FINAL MICROSCOPIC DIAGNOSIS:   A. BREAST, RIGHT, PUNCH BIOPSY:  - Carcinoma involving dermal lymphatics.  - See comment.   B. BREAST, LEFT, PUNCH BIOPSY:  - Carcinoma involving dermal lymphatics.  - See comment.   COMMENT:  The morphology is compatible with ductal breast carcinoma.    ADDENDUM:   PROGNOSTIC INDICATOR RESULTS:   Immunohistochemical and morphometric analysis performed manually   The tumor cells are NEGATIVE for Her2 (0%).   Estrogen Receptor:       NEGATIVE, 0%  Progesterone Receptor:   NEGATIVE, 0%       CURRENT THERAPY:  AC q2weeks for 4 cycles 03/22/20-05/03/20 followed by weekly  Taxoland Carboplatin q3weeksfor 12 weeks starting 05/17/20.Week 2 Taxol reduced to half dose and CT was dose reduced starting with week 4 due to thrombocytopenia.Given pancytopenia,will change Carboplatin to 50% dose weekly with Taxol $RemoveBe'80mg'ybwXgxbBN$ /m2starting with week 7 (06/13/20). Will hold Carboplatin as needed based on labs. Carboplatin increased to 80% starting with week 8.    INTERVAL HISTORY:  DREAMER CARILLO is here for a follow up and  treatment. She presents to the clinic alone. She notes she was able to be more active this past week. She does not feel symptomatic from her anemia.     REVIEW OF SYSTEMS:   Constitutional: Denies fevers, chills or abnormal weight loss Eyes: Denies blurriness of vision Ears, nose, mouth, throat, and face: Denies mucositis or sore throat Respiratory: Denies cough, dyspnea or wheezes Cardiovascular: Denies palpitation, chest discomfort or lower extremity swelling Gastrointestinal:  Denies nausea, heartburn or change in bowel habits Skin: Denies abnormal skin rashes Lymphatics: Denies new lymphadenopathy or easy bruising Neurological:Denies numbness, tingling or new weaknesses Behavioral/Psych: Mood is stable, no new changes  All other systems were reviewed with the patient and are negative.  MEDICAL HISTORY:  Past Medical History:  Diagnosis Date  . Breast cancer ([place]) dx'd 1993   left  . History of left breast cancer   . HIV infection ([place])   . Hypertension     SURGICAL HISTORY: Past Surgical History:  Procedure Laterality Date  . BREAST BIOPSY Bilateral 03/22/2020   Procedure: BILATERAL BREAST PUNCH BIOPSIES;  Surgeon: [name], MD;  Location: [facility];  Service: General;  Laterality: Bilateral;  . BREAST LUMPECTOMY    . CESAREAN SECTION    . left breast lumpectomy  1993  . PORTACATH PLACEMENT Right 03/22/2020   Procedure: INSERTION PORT-A-CATH WITH ULTRASOUND GUIDANCE;  Surgeon: [name], MD;  Location: [facility];  Service: General;  Laterality: Right;    I have reviewed the social history and family history with the patient and they are unchanged from previous note.  ALLERGIES:  is allergic to codeine, other, grapefruit bioflavonoid complex, pomegranate [punica], and shellfish allergy.  MEDICATIONS:  Current Outpatient Medications  Medication Sig Dispense Refill  . acetaminophen (TYLENOL) 500 MG tablet Take 1,000 mg by mouth every 6 (six) hours as needed for moderate  pain. (Patient not taking: Reported on 06/28/2020)    . emtricitabine-rilpivir-tenofovir AF (ODEFSEY) 200-25-25 MG TABS tablet Take 1 tablet by mouth daily with breakfast. 30 tablet 11  . hydrochlorothiazide (HYDRODIURIL) 25 MG tablet Take 1 tablet (25 mg total) by mouth daily. 30 tablet 11  . lidocaine-prilocaine (EMLA) cream Apply to affected area once 30 g 3  . loratadine (CLARITIN) 10 MG tablet Take 10 mg by mouth daily. Takes 3-4 days before and after treatment    . ondansetron (ZOFRAN) 8 MG tablet Take 1 tablet (8 mg total) by mouth 2 (two) times daily as needed. Start on the third day after chemotherapy. (Patient not taking: Reported on 06/28/2020) 30 tablet 2  . oxyCODONE (OXY IR/ROXICODONE) 5 MG immediate release tablet Take 1 tablet (5 mg total) by mouth every 6 (six) hours as needed for severe pain. (Patient not taking: Reported on 06/28/2020) 15 tablet 0  . potassium chloride SA (KLOR-CON) 20 MEQ tablet Take 1 tablet (20 mEq total) by mouth 2 (two) times daily. Take 1 tab twice daily for 3 days then 1 tab once daily 60 tablet 1  . prochlorperazine (COMPAZINE) 10 MG tablet Take 1 tablet (10 mg total) by mouth every 6 (  six) hours as needed (Nausea or vomiting). 30 tablet 2   No current facility-administered medications for this visit.   Facility-Administered Medications Ordered in Other Visits  Medication Dose Route Frequency Provider Last Rate Last Admin  . CARBOplatin (PARAPLATIN) 140 mg in sodium chloride 0.9 % 100 mL chemo infusion  140 mg Intravenous Once [name], MD      . famotidine (PEPCID) IVPB 20 mg premix  20 mg Intravenous Once [name], MD 200 mL/hr at 07/26/20 1024 20 mg at 07/26/20 1024  . heparin lock flush 100 unit/mL  500 Units Intracatheter Once PRN [name], MD      . PACLitaxel (TAXOL) 132 mg in sodium chloride 0.9 % 250 mL chemo infusion (</= $RemoveBefor'80mg'HbFXmuCrzSpF$ /m2)  80 mg/m2 (Order-Specific) Intravenous Once [name], MD      . sodium chloride flush (NS) 0.9 % injection 10 mL   10 mL Intracatheter PRN [name], MD        PHYSICAL EXAMINATION: ECOG PERFORMANCE STATUS: 1 - Symptomatic but completely ambulatory  Vitals:   07/26/20 0849  BP: 130/85  Pulse: 91  Resp: 17  Temp: 98.4 F (36.9 C)  SpO2: 100%   Filed Weights   07/26/20 0849  Weight: 141 lb (64 kg)    Due to COVID19 we will limit examination to appearance. Patient had no complaints.  GENERAL:alert, no distress and comfortable SKIN: skin color normal, no rashes or significant lesions EYES: normal, Conjunctiva are pink and non-injected, sclera clear  NEURO: alert & oriented x 3 with fluent speech   LABORATORY DATA:  I have reviewed the data as listed CBC Latest Ref Rng & Units 07/26/2020 07/19/2020 07/12/2020  WBC 4.0 - 10.5 K/uL 3.0(L) 3.2(L) 3.3(L)  Hemoglobin 12.0 - 15.0 g/dL 8.1(L) 8.4(L) 8.7(L)  Hematocrit 36 - 46 % 24.4(L) 25.4(L) 26.8(L)  Platelets 150 - 400 K/uL 151 180 198     CMP Latest Ref Rng & Units 07/26/2020 07/19/2020 07/12/2020  Glucose 70 - 99 mg/dL 94 102(H) 94  BUN 8 - 23 mg/dL $Remove'15 14 15  'YrICcKB$ Creatinine 0.44 - 1.00 mg/dL 0.80 0.83 0.81  Sodium 135 - 145 mmol/L 137 137 139  Potassium 3.5 - 5.1 mmol/L 4.3 4.0 4.9  Chloride 98 - 111 mmol/L 107 107 112(H)  CO2 22 - 32 mmol/L 25 21(L) 19(L)  Calcium 8.9 - 10.3 mg/dL 8.9 9.0 9.2  Total Protein 6.5 - 8.1 g/dL 6.9 7.1 7.0  Total Bilirubin 0.3 - 1.2 mg/dL 0.2(L) 0.3 0.3  Alkaline Phos 38 - 126 U/L 136(H) 139(H) 132(H)  AST 15 - 41 U/L $Remo'24 24 21  'drQBR$ ALT 0 - 44 U/L $Remo'21 21 19      'xtodv$ RADIOGRAPHIC STUDIES: I have personally reviewed the radiological images as listed and agreed with the findings in the report. No results found.   ASSESSMENT & PLAN:  Victoria Coffey is a 64 y.o. female with    1.Left breast cancer,cT4N0Mx, inflammatorybreast cancer, triple negative,andright breat cancer in [facility] withaxillary node metastasis, ERweakly+,PR/HER2-, Azucena Kuba -She was diagnosed with left breast cancer in 02/2020.she presented with  inflammatory left breast cancer. 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 -further work up showed 9cm nodular area in right breast and skin biopsy confirmed cancer involvement -Staging scans were negative for distant metastasis -Istarted her on neoadjuvantchemotherapy as first-line treatmentwithAdriamycin and Cytoxanwhich she completed 4cycles5/21/21-05/03/20.She then proceeded with weekly Taxoland carboplatinevery 3 weeks starting 05/17/20. Due to poor tolerance carboplatin was changed  to weekly with dose reduction.  -She continues to tolerate current regimen and dose well. Labs reviewed and adequate to proceed with week 10 TC today at same dose.  -She will proceed with Breast MRI week of 08/13/20. She plans to see Dr [name] 10/18 -F/u next week.   2.Neutropenia, Secondary to chemo -Carboplatin does has been reduced and neutropenia intermittent. Will hold Carboplatin as needed based on labs.  -Neutropenia has resolved in recent weeks, so I increase carboplatin dose by 30% starting with C8.  -She is fine to proceed with Flu shot when available and I suggest proceeding with COVID booster after chemo.  3. Anemia, secondary to chemo -Will give blood transfusion for Hg <7, last on 05/27/20 -She is currently on prenatal vitamin. -Hg at 8.1 today (07/26/20). She is not symptomatic, hold on blood transfusion for now   4. Weight Loss, Low appetite -F/u with dietician as needed.  -Weight mostly stable now, with fluctuation.   5. H/o Left breast cancer, Dx in 1993s/p lumpectomy, radiation, chemo, Tamoxifen.Genetic Testinghas not been done yet.  6. Comorbidities: HIV(+)Dx 1993, HTN -Her HIV has been undetectable and well controlled. She is currently being seen by ID Dr Graylon Good  7.Mild transaminitis  -Unclear etiology, presents before chemotherapy.Her6/16/21 MRIabdomenwas benign. -Her LFTshave resolved (05/24/20)and  she continues to havemildly elevated Alk Phos.  8. Hypokalemia (Resolved)  -She can continue oral potassium once daily now.  PLAN: -Labs reviewed and adequate to proceed with week 10 Taxol and Carboplatin at same dose  -Lab, flush, F/uand chemoTaxol Carboplatin in 1, 2 weeks  -MRI breast week of 08/13/20  No problem-specific Assessment & Plan notes found for this encounter.   No orders of the defined types were placed in this encounter.  All questions were answered. The patient knows to call the clinic with any problems, questions or concerns. No barriers to learning was detected. The total time spent in the appointment was 25 minutes.     [name], MD 07/26/2020   I, Joslyn Devon, am acting as scribe for [name], MD.   I have reviewed the above documentation for accuracy and completeness, and I agree with the above.

## 2020-07-24 NOTE — Telephone Encounter (Signed)
Patient called office today to ask MD if it is okay to have ID labs drawn at oncology appointment Friday. Is scheduled for follow up on 9/29 with [name]. Requested patient contact [name] office to ask if they are okay with doing labs. Will call office if she has any additional concerns.  [place]

## 2020-07-25 ENCOUNTER — Encounter: Payer: Self-pay | Admitting: *Deleted

## 2020-07-25 ENCOUNTER — Telehealth: Payer: Self-pay | Admitting: *Deleted

## 2020-07-25 NOTE — Telephone Encounter (Signed)
Patient called to follow up on lab orders. Spoke with [facility] lab tech, Victoria Coffey who requested we fax orders to them for lab draw tomorrow. Faxed orders and will notify patient that it's ok for them to be drawn at that appointment.   Victoria Zachery Lorita Officer, RN

## 2020-07-25 NOTE — Telephone Encounter (Signed)
Spoke to pt confirmed breast MRI for 10/12 at [facility] at 7:30am.

## 2020-07-26 ENCOUNTER — Inpatient Hospital Stay (HOSPITAL_BASED_OUTPATIENT_CLINIC_OR_DEPARTMENT_OTHER): Payer: BC Managed Care – PPO | Admitting: Hematology

## 2020-07-26 ENCOUNTER — Inpatient Hospital Stay: Payer: BC Managed Care – PPO

## 2020-07-26 ENCOUNTER — Other Ambulatory Visit: Payer: Self-pay

## 2020-07-26 ENCOUNTER — Encounter: Payer: Self-pay | Admitting: *Deleted

## 2020-07-26 ENCOUNTER — Encounter: Payer: Self-pay | Admitting: Hematology

## 2020-07-26 VITALS — BP 130/85 | HR 91 | Temp 98.4°F | Resp 17 | Ht 61.0 in | Wt 141.0 lb

## 2020-07-26 DIAGNOSIS — Z79899 Other long term (current) drug therapy: Secondary | ICD-10-CM

## 2020-07-26 DIAGNOSIS — Z170 Estrogen receptor positive status [ER+]: Secondary | ICD-10-CM | POA: Diagnosis not present

## 2020-07-26 DIAGNOSIS — Z95828 Presence of other vascular implants and grafts: Secondary | ICD-10-CM

## 2020-07-26 DIAGNOSIS — C50412 Malignant neoplasm of upper-outer quadrant of left female breast: Secondary | ICD-10-CM

## 2020-07-26 DIAGNOSIS — B20 Human immunodeficiency virus [HIV] disease: Secondary | ICD-10-CM

## 2020-07-26 DIAGNOSIS — Z5111 Encounter for antineoplastic chemotherapy: Secondary | ICD-10-CM | POA: Diagnosis not present

## 2020-07-26 LAB — CMP (CANCER CENTER ONLY)
ALT: 21 U/L (ref 0–44)
AST: 24 U/L (ref 15–41)
Albumin: 3.4 g/dL — ABNORMAL LOW (ref 3.5–5.0)
Alkaline Phosphatase: 136 U/L — ABNORMAL HIGH (ref 38–126)
Anion gap: 5 (ref 5–15)
BUN: 15 mg/dL (ref 8–23)
CO2: 25 mmol/L (ref 22–32)
Calcium: 8.9 mg/dL (ref 8.9–10.3)
Chloride: 107 mmol/L (ref 98–111)
Creatinine: 0.8 mg/dL (ref 0.44–1.00)
GFR, Est AFR Am: >60 mL/min (ref >60)
GFR, Estimated: >60 mL/min (ref >60)
Glucose, Bld: 94 mg/dL (ref 70–99)
Potassium: 4.3 mmol/L (ref 3.5–5.1)
Sodium: 137 mmol/L (ref 135–145)
Total Bilirubin: 0.2 mg/dL — ABNORMAL LOW (ref 0.3–1.2)
Total Protein: 6.9 g/dL (ref 6.5–8.1)

## 2020-07-26 LAB — CBC WITH DIFFERENTIAL (CANCER CENTER ONLY)
Abs Immature Granulocytes: 0.01 10*3/uL (ref 0.00–0.07)
Basophils Absolute: 0 10*3/uL (ref 0.0–0.1)
Basophils Relative: 0 %
Eosinophils Absolute: 0 10*3/uL (ref 0.0–0.5)
Eosinophils Relative: 0 %
HCT: 24.4 % — ABNORMAL LOW (ref 36.0–46.0)
Hemoglobin: 8.1 g/dL — ABNORMAL LOW (ref 12.0–15.0)
Immature Granulocytes: 0 %
Lymphocytes Relative: 34 %
Lymphs Abs: 1 10*3/uL (ref 0.7–4.0)
MCH: 32 pg (ref 26.0–34.0)
MCHC: 33.2 g/dL (ref 30.0–36.0)
MCV: 96.4 fL (ref 80.0–100.0)
Monocytes Absolute: 0.2 10*3/uL (ref 0.1–1.0)
Monocytes Relative: 7 %
Neutro Abs: 1.7 10*3/uL (ref 1.7–7.7)
Neutrophils Relative %: 59 %
Platelet Count: 151 10*3/uL (ref 150–400)
RBC: 2.53 MIL/uL — ABNORMAL LOW (ref 3.87–5.11)
RDW: 20.3 % — ABNORMAL HIGH (ref 11.5–15.5)
WBC Count: 3 10*3/uL — ABNORMAL LOW (ref 4.0–10.5)
nRBC: 1 % — ABNORMAL HIGH (ref 0.0–0.2)

## 2020-07-26 LAB — T-HELPER CELL (CD4) - (RCID CLINIC ONLY)
CD4 % Helper T Cell: 43 % (ref 33–65)
CD4 T Cell Abs: 393 /uL — ABNORMAL LOW (ref 400–1790)

## 2020-07-26 MED ORDER — FAMOTIDINE IN NACL 20-0.9 MG/50ML-% IV SOLN
INTRAVENOUS | Status: AC
  Filled 2020-07-26: qty 50

## 2020-07-26 MED ORDER — DIPHENHYDRAMINE HCL 50 MG/ML IJ SOLN
INTRAMUSCULAR | Status: AC
  Filled 2020-07-26: qty 1

## 2020-07-26 MED ORDER — FAMOTIDINE IN NACL 20-0.9 MG/50ML-% IV SOLN
20.0000 mg | Freq: Once | INTRAVENOUS | Status: AC
  Administered 2020-07-26: 20 mg via INTRAVENOUS

## 2020-07-26 MED ORDER — METHYLPREDNISOLONE SODIUM SUCC 125 MG IJ SOLR
100.0000 mg | Freq: Once | INTRAMUSCULAR | Status: AC
  Administered 2020-07-26: 100 mg via INTRAVENOUS

## 2020-07-26 MED ORDER — SODIUM CHLORIDE 0.9 % IV SOLN
Freq: Once | INTRAVENOUS | Status: AC
  Filled 2020-07-26: qty 250

## 2020-07-26 MED ORDER — PALONOSETRON HCL INJECTION 0.25 MG/5ML
INTRAVENOUS | Status: AC
  Filled 2020-07-26: qty 5

## 2020-07-26 MED ORDER — SODIUM CHLORIDE 0.9 % IV SOLN
80.0000 mg/m2 | Freq: Once | INTRAVENOUS | Status: AC
  Administered 2020-07-26: 132 mg via INTRAVENOUS
  Filled 2020-07-26: qty 22

## 2020-07-26 MED ORDER — PALONOSETRON HCL INJECTION 0.25 MG/5ML
0.2500 mg | Freq: Once | INTRAVENOUS | Status: AC
  Administered 2020-07-26: 0.25 mg via INTRAVENOUS

## 2020-07-26 MED ORDER — SODIUM CHLORIDE 0.9% FLUSH
10.0000 mL | Freq: Once | INTRAVENOUS | Status: AC
  Administered 2020-07-26: 10 mL
  Filled 2020-07-26: qty 10

## 2020-07-26 MED ORDER — SODIUM CHLORIDE 0.9 % IV SOLN: 142.0900 mg | Freq: Once | INTRAVENOUS | Status: DC

## 2020-07-26 MED ORDER — SODIUM CHLORIDE 0.9% FLUSH
10.0000 mL | INTRAVENOUS | Status: DC | PRN
  Administered 2020-07-26: 10 mL
  Filled 2020-07-26: qty 10

## 2020-07-26 MED ORDER — HEPARIN SOD (PORK) LOCK FLUSH 100 UNIT/ML IV SOLN
500.0000 [IU] | Freq: Once | INTRAVENOUS | Status: AC | PRN
  Administered 2020-07-26: 500 [IU]
  Filled 2020-07-26: qty 5

## 2020-07-26 MED ORDER — SODIUM CHLORIDE 0.9 % IV SOLN
125.8400 mg | Freq: Once | INTRAVENOUS | Status: AC
  Administered 2020-07-26: 130 mg via INTRAVENOUS
  Filled 2020-07-26: qty 13

## 2020-07-26 MED ORDER — METHYLPREDNISOLONE SODIUM SUCC 125 MG IJ SOLR
INTRAMUSCULAR | Status: AC
  Filled 2020-07-26: qty 2

## 2020-07-26 MED ORDER — DIPHENHYDRAMINE HCL 50 MG/ML IJ SOLN
50.0000 mg | Freq: Once | INTRAMUSCULAR | Status: AC
  Administered 2020-07-26: 50 mg via INTRAVENOUS

## 2020-07-26 NOTE — Patient Instructions (Signed)
[facility] °Discharge Instructions for Patients Receiving Chemotherapy ° °Today you received the following chemotherapy agents Taxol; Carboplatin ° °To help prevent nausea and vomiting after your treatment, we encourage you to take your nausea medication as directed °  °If you develop nausea and vomiting that is not controlled by your nausea medication, call the clinic.  ° °BELOW ARE SYMPTOMS THAT SHOULD BE REPORTED IMMEDIATELY: °· *FEVER GREATER THAN 100.5 F °· *CHILLS WITH OR WITHOUT FEVER °· NAUSEA AND VOMITING THAT IS NOT CONTROLLED WITH YOUR NAUSEA MEDICATION °· *UNUSUAL SHORTNESS OF BREATH °· *UNUSUAL BRUISING OR BLEEDING °· TENDERNESS IN MOUTH AND THROAT WITH OR WITHOUT PRESENCE OF ULCERS °· *URINARY PROBLEMS °· *BOWEL PROBLEMS °· UNUSUAL RASH °Items with * indicate a potential emergency and should be followed up as soon as possible. ° °Feel free to call the clinic should you have any questions or concerns. The clinic phone number is [phone]. ° °Please show the CHEMO ALERT CARD at check-in to the Emergency Department and triage nurse. ° ° °

## 2020-07-27 LAB — CANCER ANTIGEN 27.29: CA 27.29: 67.2 U/mL — ABNORMAL HIGH (ref 0.0–38.6)

## 2020-07-30 ENCOUNTER — Telehealth: Payer: Self-pay

## 2020-07-30 NOTE — Telephone Encounter (Signed)
COVID-19 Pre-Screening Questions:07/30/20  Do you currently have a fever (>100 F), chills or unexplained body aches?NO  Are you currently experiencing new cough, shortness of breath, sore throat, runny nose?NO .  Have you been in contact with someone that is currently pending confirmation of Covid19 testing or has been confirmed to have the [place] virus?  NO   **If the patient answers NO to ALL questions -  advise the patient to please call the clinic before coming to the office should any symptoms develop.

## 2020-07-31 ENCOUNTER — Encounter: Payer: Self-pay | Admitting: Internal Medicine

## 2020-07-31 ENCOUNTER — Ambulatory Visit (INDEPENDENT_AMBULATORY_CARE_PROVIDER_SITE_OTHER): Payer: BC Managed Care – PPO | Admitting: Internal Medicine

## 2020-07-31 ENCOUNTER — Other Ambulatory Visit: Payer: Self-pay

## 2020-07-31 VITALS — BP 143/90 | HR 91 | Temp 98.2°F | Ht 61.0 in | Wt 142.0 lb

## 2020-07-31 DIAGNOSIS — Z79899 Other long term (current) drug therapy: Secondary | ICD-10-CM

## 2020-07-31 DIAGNOSIS — D702 Other drug-induced agranulocytosis: Secondary | ICD-10-CM | POA: Diagnosis not present

## 2020-07-31 DIAGNOSIS — Z853 Personal history of malignant neoplasm of breast: Secondary | ICD-10-CM

## 2020-07-31 DIAGNOSIS — B20 Human immunodeficiency virus [HIV] disease: Secondary | ICD-10-CM

## 2020-07-31 NOTE — Progress Notes (Signed)
[place]   [phone] Fax:([phone]   Clinic Follow up Note   Patient Care Team: [name], MD as PCP - General (Infectious Diseases) [name], MD as PCP - Infectious Diseases (Infectious Diseases) [name], MD as Consulting Physician (General Surgery) [name], MD as Consulting Physician (Hematology) [name], MD as Consulting Physician (Radiation Oncology) [name], RN as Oncology Nurse Navigator [name], RN as Oncology Nurse Navigator  Date of Service:  08/02/2020  CHIEF COMPLAINT: F/u of left breast cancer  SUMMARY OF ONCOLOGIC HISTORY: Oncology History Overview Note  Cancer Staging Malignant neoplasm of upper-outer quadrant of left breast in female, estrogen receptor positive ([place]) Staging form: Breast, AJCC 8th Edition - Clinical stage from 03/01/2020: Stage IV (cT4, cN0, pM1, G3, ER+, PR-, HER2-) - Signed by [name], MD on 03/06/2020    Malignant neoplasm of upper-outer quadrant of left breast in female, estrogen receptor positive ([place])  02/28/2020 Mammogram   Diagnostic Mammogram 02/28/20  IMPRESSION The 3.9 cm ill defined mass in the left breast posterior depth central 4.3cm to the nipple seen on the mediolateral oblique view onlt with associated difssue skin thickening is highly suspicious for malignancy.    03/01/2020 Cancer Staging   Staging form: Breast, AJCC 8th Edition - Clinical stage from 03/01/2020: Stage IV (cT4, cN0, pM1, G3, ER+, PR-, HER2-) - Signed by [name], MD on 03/06/2020   03/01/2020 Initial Biopsy   Diagnosis 03/01/20  1. Breast, left, needle core biopsy, 9 o'clock, 3-4cmfn - INVASIVE DUCTAL CARCINOMA. SEE NOTE 2. Lymph node, needle/core biopsy, right axilla - INVASIVE DUCTAL CARCINOMA. SEE NOTE Diagnosis Note 1. Invasive carcinoma measures 1.5 cm in greatest linear dimension and appears grade 3. Dr. [name] reviewed the case and concurs with the diagnosis. A breast prognostic  profile (ER, PR, Ki-67 and HER2) is pending and will be reported in an addendum. Dr. [name] was notified on 03/04/2020. 2. Carcinoma measures 0.6 cm in greatest linear dimension and appears grade 3. Lymphoid tissue is not identified - this may represent a completely replaced lymph node. A breast prognostic profile (ER, PR, Ki-67 and HER2) is pending and will be reported in an addendum. Dr. [name] reviewed the case and concurs with the diagnosis. Dr. [name] was notified on 03/04/2020.   03/01/2020 Receptors her2   1. PROGNOSTIC INDICATORS Results: IMMUNOHISTOCHEMICAL AND MORPHOMETRIC ANALYSIS PERFORMED MANUALLY The tumor cells are NEGATIVE for Her2 (0). Estrogen Receptor: 20%, POSITIVE, WEAK STAINING INTENSITY Progesterone Receptor: 0%, NEGATIVE Proliferation Marker Ki67: 40% COMMENT: The negative hormone receptor study(ies) in this case has an internal positive control.    2. PROGNOSTIC INDICATORS Results: IMMUNOHISTOCHEMICAL AND MORPHOMETRIC ANALYSIS PERFORMED MANUALLY The tumor cells are NEGATIVE for Her2 (1+). Estrogen Receptor: 10%, POSITIVE, WEAK STAINING INTENSITY Progesterone Receptor: 0%, NEGATIVE Proliferation Marker Ki67: 40% COMMENT: The negative hormone receptor study(ies) in this case has no internal positive control.   03/05/2020 Initial Diagnosis   Malignant neoplasm of upper-outer quadrant of left breast in female, estrogen receptor positive ([place])   03/18/2020 Breast MRI   IMPRESSION: 1. 7 x 9 x 6 cm area of suspicious non masslike enhancement within the central/UPPER RIGHT breast with anterior skin thickening. Given biopsy-proven metastatic RIGHT axillary lymph node, tissue sampling of the anterior and posterior aspects of this non masslike RIGHT breast enhancement is recommended to exclude malignancy. 2. 7 x 6 x 5 cm diffuse masslike and nonmasslike enhancement within the majority of the remaining LEFT breast with diffuse LEFT breast  skin thickening, compatible  with malignancy. Abnormal enhancement is noted along the anterior aspect of the LEFT pectoralis muscle. 3. Three abnormal RIGHT axillary lymph nodes, 1 which is biopsy proven to be metastatic disease. No abnormal LEFT axillary lymph nodes identified.   03/20/2020 Echocardiogram   Baseline ECHO  IMPRESSIONS     1. The patient was reported to be in pain during the study and not able  to participate in this study. All that can be said is that the LV and RV  function are grossly normal. Would recommend to repeat this study when/if  the patient is able to tolerate  the exam.   2. Left ventricular ejection fraction, by estimation, is 60 to 65%. The  left ventricle has normal function. Left ventricular endocardial border  not optimally defined to evaluate regional wall motion. Left ventricular  diastolic function could not be  evaluated.   3. Right ventricular systolic function is normal. The right ventricular  size is normal.   4. The mitral valve was not well visualized. No evidence of mitral valve  regurgitation.   5. The aortic valve was not well visualized. Aortic valve regurgitation  is not visualized.   6. The inferior vena cava is normal in size with greater than 50%  respiratory variability, suggesting right atrial pressure of 3 mmHg.    03/20/2020 Imaging   CT CAP W contrast  IMPRESSION: 1. Small right axillary/subpectoral lymph nodes. Difficult to exclude metastatic disease. 2. Subtle 6 mm low-attenuation lesion in the dome of the liver, not well seen previously. Lesion is too small to characterize. Further evaluation could be performed with MR abdomen without and with contrast, as clinically indicated. 3. Hepatic steatosis. 4. Aortic atherosclerosis (ICD10-I70.0). Coronary artery calcification.     03/20/2020 Imaging   Whole body bone scan  IMPRESSION: 1. Increased activity noted over the right breast, possibly related to the patient's right breast cancer.   2.  Punctate area of increased activity over the left maxillary region, most likely related to sinus disease or dental disease. Increased activity noted over the left mandible also possibly related to dental disease. No other focal bony abnormalities to suggest metastatic disease identified.   03/22/2020 Procedure   PAC placement by Dr [name]    03/22/2020 - 08/09/2020 Chemotherapy   AC q2weeks for 4 cycles 03/22/20-05/03/20 followed by weekly Taxol and Carboplatin q3weeks for 12 weeks starting 05/17/20.               Week 2 Taxol reduced to half dose and CT was dose reduced starting with week 4 due to thrombocytopenia. Given pancytopenia, will change Carboplatin to 50% dose weekly with Taxol 96m/m2 starting with week 7 (06/13/20). Will hold Carboplatin as needed based on labs. Carboplatin increased to 80% starting with week 8. Carboplatin dose reduced for C11-12. Plan to complete 08/09/20   03/22/2020 Pathology Results   FINAL MICROSCOPIC DIAGNOSIS:   A. BREAST, RIGHT, PUNCH BIOPSY:  - Carcinoma involving dermal lymphatics.  - See comment.   B. BREAST, LEFT, PUNCH BIOPSY:  - Carcinoma involving dermal lymphatics.  - See comment.   COMMENT:  The morphology is compatible with ductal breast carcinoma.    ADDENDUM:   PROGNOSTIC INDICATOR RESULTS:   Immunohistochemical and morphometric analysis performed manually   The tumor cells are NEGATIVE for Her2 (0%).   Estrogen Receptor:       NEGATIVE, 0%  Progesterone Receptor:   NEGATIVE, 0%       CURRENT THERAPY:  AC  q2weeks for 4 cycles 03/22/20-05/03/20 followed by weekly Taxoland Carboplatin q3weeksfor 12 weeks starting 05/17/20.Week 2 Taxol reduced to half dose and CT was dose reduced starting with week 4 due to thrombocytopenia.Given pancytopenia,will change Carboplatin to 50% dose weekly with Taxol $RemoveBe'80mg'qxsnnYqOe$ /m2starting with week 7 (06/13/20). Will hold Carboplatin as needed based on labs.Carboplatin increased to 80% starting with week  8.Carboplatin dose reduced for C11-12. Plan to complete 08/09/20  INTERVAL HISTORY:  Victoria Coffey is here for a follow up and treatment. She presents to the clinic alone. She notes her chemo last week went well. She was able to go to park around day 2-3 and able to walk through out the week. She denies any bleeding or nausea. She plans to f/u with Dr [name] after her MRI breast.    REVIEW OF SYSTEMS:   Constitutional: Denies fevers, chills or abnormal weight loss Eyes: Denies blurriness of vision Ears, nose, mouth, throat, and face: Denies mucositis or sore throat Respiratory: Denies cough, dyspnea or wheezes Cardiovascular: Denies palpitation, chest discomfort or lower extremity swelling Gastrointestinal:  Denies nausea, heartburn or change in bowel habits Skin: Denies abnormal skin rashes Lymphatics: Denies new lymphadenopathy or easy bruising Neurological:Denies numbness, tingling or new weaknesses Behavioral/Psych: Mood is stable, no new changes  All other systems were reviewed with the patient and are negative.  MEDICAL HISTORY:  Past Medical History:  Diagnosis Date   Breast cancer ([place]) dx'd 1993   left   History of left breast cancer    HIV infection ([place])    Hypertension     SURGICAL HISTORY: Past Surgical History:  Procedure Laterality Date   BREAST BIOPSY Bilateral 03/22/2020   Procedure: BILATERAL BREAST PUNCH BIOPSIES;  Surgeon: [name], MD;  Location: [facility];  Service: General;  Laterality: Bilateral;   BREAST LUMPECTOMY     CESAREAN SECTION     left breast lumpectomy  [place] Right 03/22/2020   Procedure: INSERTION PORT-A-CATH WITH ULTRASOUND GUIDANCE;  Surgeon: [name], MD;  Location: [facility];  Service: General;  Laterality: Right;    I have reviewed the social history and family history with the patient and they are unchanged from previous note.  ALLERGIES:  is allergic to codeine, other, grapefruit bioflavonoid complex,  pomegranate [punica], and shellfish allergy.  MEDICATIONS:  Current Outpatient Medications  Medication Sig Dispense Refill   acetaminophen (TYLENOL) 500 MG tablet Take 1,000 mg by mouth every 6 (six) hours as needed for moderate pain.      emtricitabine-rilpivir-tenofovir AF (ODEFSEY) 200-25-25 MG TABS tablet Take 1 tablet by mouth daily with breakfast. 30 tablet 11   hydrochlorothiazide (HYDRODIURIL) 25 MG tablet Take 1 tablet (25 mg total) by mouth daily. 30 tablet 11   lidocaine-prilocaine (EMLA) cream Apply to affected area once 30 g 3   loratadine (CLARITIN) 10 MG tablet Take 10 mg by mouth daily. Takes 3-4 days before and after treatment     ondansetron (ZOFRAN) 8 MG tablet Take 1 tablet (8 mg total) by mouth 2 (two) times daily as needed. Start on the third day after chemotherapy. 30 tablet 2   oxyCODONE (OXY IR/ROXICODONE) 5 MG immediate release tablet Take 1 tablet (5 mg total) by mouth every 6 (six) hours as needed for severe pain. 15 tablet 0   potassium chloride SA (KLOR-CON) 20 MEQ tablet Take 1 tablet (20 mEq total) by mouth 2 (two) times daily. Take 1 tab twice daily for 3 days then 1 tab once daily 60 tablet  1   prochlorperazine (COMPAZINE) 10 MG tablet Take 1 tablet (10 mg total) by mouth every 6 (six) hours as needed (Nausea or vomiting). 30 tablet 2   No current facility-administered medications for this visit.    PHYSICAL EXAMINATION: ECOG PERFORMANCE STATUS: 1 - Symptomatic but completely ambulatory  Vitals:   08/02/20 0854  BP: 137/80  Pulse: 94  Resp: 18  Temp: 98.1 F (36.7 C)  SpO2: 100%   Filed Weights   08/02/20 0854  Weight: 140 lb 1.6 oz (63.5 kg)    Due to COVID19 we will limit examination to appearance. Patient had no complaints.  GENERAL:alert, no distress and comfortable SKIN: skin color normal, no rashes or significant lesions EYES: normal, Conjunctiva are pink and non-injected, sclera clear  NEURO: alert & oriented x 3 with fluent  speech   LABORATORY DATA:  I have reviewed the data as listed CBC Latest Ref Rng & Units 08/02/2020 07/26/2020 07/19/2020  WBC 4.0 - 10.5 K/uL 2.1(L) 3.0(L) 3.2(L)  Hemoglobin 12.0 - 15.0 g/dL 8.2(L) 8.1(L) 8.4(L)  Hematocrit 36 - 46 % 24.6(L) 24.4(L) 25.4(L)  Platelets 150 - 400 K/uL 96(L) 151 180     CMP Latest Ref Rng & Units 07/26/2020 07/19/2020 07/12/2020  Glucose 70 - 99 mg/dL 94 102(H) 94  BUN 8 - 23 mg/dL _0 Creatinine 0.44 - 1.00 mg/dL 0.80 0.83 0.81  Sodium 135 - 145 mmol/L 137 137 139  Potassium 3.5 - 5.1 mmol/L 4.3 4.0 4.9  Chloride 98 - 111 mmol/L 107 107 112(H)  CO2 22 - 32 mmol/L 25 21(L) 19(L)  Calcium 8.9 - 10.3 mg/dL 8.9 9.0 9.2  Total Protein 6.5 - 8.1 g/dL 6.9 7.1 7.0  Total Bilirubin 0.3 - 1.2 mg/dL 0.2(L) 0.3 0.3  Alkaline Phos 38 - 126 U/L 136(H) 139(H) 132(H)  AST 15 - 41 U/L _1 ALT 0 - 44 U/L _2 RADIOGRAPHIC STUDIES: I have personally reviewed the radiological images as listed and agreed with the findings in the report. No results found.   ASSESSMENT & PLAN:  Victoria Coffey is a 64 y.o. female with    1.Left breast cancer,cT4N0Mx, inflammatorybreast cancer, triple negative,andright breat cancer in UOQ withaxillary node metastasis, ERweakly+,PR/HER2-, Azucena Kuba -She was diagnosed with left breast cancer in 02/2020.she presented with inflammatory left breast cancer. 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 -further work up showed 9cm nodular area in right breast and skin biopsy confirmed cancer involvement -Staging scans were negative for distant metastasis -Istarted her on neoadjuvantchemotherapy as first-line treatmentwithAdriamycin and Cytoxanwhich she completed 4cycles5/21/21-05/03/20.She then proceeded with weekly Taxoland carboplatinevery 3 weeksstarting 05/17/20-08/09/20. Due to poor tolerance carboplatin was changed to weekly with dose reduction.   -She  continues to tolerate current regimen and dose well. Labs reviewed, WBC 2.1, Hg 8.2, plt 96K, ANC 0.8. Overall adequate to proceed with week 11 TC today with dose reduced Carboplatin to AUC 0.8   -She will proceed with Breast MRI week of 08/13/20. She plans to see Dr [name] 10/18 -F/u next week before last dose chemo   2.Neutropenia, Thrombocytopenia, Secondary to chemo -Carboplatin does has been reduced and neutropenia intermittent. Will hold Carboplatin as needed based on labs.  -Neutropenia did resolve and was able to increase carboplatin dose by 30% starting with C8.  -Lab today show WBC 2.1, plt 96K, ANC 0.8 (08/02/20). Will dose reduce carboplatin for C11-12.  -She  plans to proceed with Flu shot and COVID booster after chemo and before surgery.   3. Anemia, secondary to chemo -Will give blood transfusion for Hg <7, last on 05/27/20 -She is currently on prenatal vitamin. -Hg stable at 8.2 today (07/26/20). She is not symptomatic, continue to hold on blood transfusion for now   4. Weight Loss, Low appetite -F/u with dietician as needed.  -Weight mostly stable now, with fluctuation.  5. H/o Left breast cancer, Dx in 1993s/p lumpectomy, radiation, chemo, Tamoxifen.Genetic Testinghas not been done yet.  6. Comorbidities: HIV(+)Dx 1993, HTN -Her HIV has been undetectable and well controlled. She is currently being seen by ID Dr Graylon Good  7.Mild transaminitis  -Unclear etiology, presents before chemotherapy.Her6/16/21 MRIabdomenwas benign. -Her LFTshave resolved (05/24/20)and she continues to havemildly elevated Alk Phos.  8. Hypokalemia(Resolved) -She can continue oral potassium once daily now.  PLAN: -Labs reviewed and adequate to proceed with week11Taxol and Carboplatin with dose reduced carboplatinto AUC 0.8 due to cytopenias  -no need blood transfusion for now  -Lab, flush, F/uand chemoTaxol Carboplatin next week (last dose) -MRI breast week of  08/13/20   No problem-specific Assessment & Plan notes found for this encounter.   No orders of the defined types were placed in this encounter.  All questions were answered. The patient knows to call the clinic with any problems, questions or concerns. No barriers to learning was detected. The total time spent in the appointment was 30 minutes.     [name], MD 08/02/2020   I, Joslyn Devon, am acting as scribe for [name], MD.   I have reviewed the above documentation for accuracy and completeness, and I agree with the above.

## 2020-07-31 NOTE — Progress Notes (Signed)
RFV: follow up for hiv disease  Patient ID: Victoria Coffey, female   DOB: 1955/12/28, 63 y.o.   MRN: [id number]  HPI Victoria Coffey is a 64yo F with well controlled hiv disease on odefsey - CD 4 count of 393/VL<20 in late July 2021. She also has been diagnosed with recurrent breast ca this year, dr Ernestina Penna schedule for now is every week getting chemo. Has had side effect of pancytopenia. And anemia requiring rbc transfusion last week. Currently on weekly Taxoland Carboplatin with some dose adjustment through October 15th. Has new mri oct 12th. Also has follow up with dr [name] for mastectomy on oct 18th --hopefully get mastectomy by end of year. She feels that the discoloration is improved. Still has significant shrinkage and mass to left breast and some right breast. She is looking forward to getting mastectomy. She is concerned about the mass being on the chest wall and difficulty on debridement.   She has been talking to other patients with breast cancer for support and education    Outpatient Encounter Medications as of 07/31/2020  Medication Sig  . acetaminophen (TYLENOL) 500 MG tablet Take 1,000 mg by mouth every 6 (six) hours as needed for moderate pain.   Marland Kitchen emtricitabine-rilpivir-tenofovir AF (ODEFSEY) 200-25-25 MG TABS tablet Take 1 tablet by mouth daily with breakfast.  . hydrochlorothiazide (HYDRODIURIL) 25 MG tablet Take 1 tablet (25 mg total) by mouth daily.  Marland Kitchen lidocaine-prilocaine (EMLA) cream Apply to affected area once  . loratadine (CLARITIN) 10 MG tablet Take 10 mg by mouth daily. Takes 3-4 days before and after treatment  . ondansetron (ZOFRAN) 8 MG tablet Take 1 tablet (8 mg total) by mouth 2 (two) times daily as needed. Start on the third day after chemotherapy.  Marland Kitchen oxyCODONE (OXY IR/ROXICODONE) 5 MG immediate release tablet Take 1 tablet (5 mg total) by mouth every 6 (six) hours as needed for severe pain.  . potassium chloride SA (KLOR-CON) 20 MEQ tablet Take 1 tablet (20 mEq  total) by mouth 2 (two) times daily. Take 1 tab twice daily for 3 days then 1 tab once daily  . prochlorperazine (COMPAZINE) 10 MG tablet Take 1 tablet (10 mg total) by mouth every 6 (six) hours as needed (Nausea or vomiting).   No facility-administered encounter medications on file as of 07/31/2020.     Patient Active Problem List   Diagnosis Date Noted  . Drug-induced neutropenia ([place]) 06/28/2020  . Thrombocytopenia ([place]) 06/07/2020  . Port-A-Cath in place 04/19/2020  . Malignant neoplasm of upper-outer quadrant of left breast in female, estrogen receptor positive ([place]) 03/05/2020  . Rash and nonspecific skin eruption 11/08/2013  . History of breast cancer 12/20/2012  . HYPERTENSION, BENIGN ESSENTIAL 08/11/2007  . HIV DISEASE 04/14/2007     Health Maintenance Due  Topic Date Due  . TETANUS/TDAP  Never done  . COLONOSCOPY  Never done  . PAP SMEAR-Modifier  11/14/2011  . INFLUENZA VACCINE  06/02/2020    Social History   Tobacco Use  . Smoking status: Never Smoker  . Smokeless tobacco: Never Used  Vaping Use  . Vaping Use: Never used  Substance Use Topics  . Alcohol use: No  . Drug use: No   Review of Systems  Constitutional: Negative for fever, chills, diaphoresis, activity change, appetite change, fatigue and unexpected weight change.  HENT: Negative for congestion, sore throat, rhinorrhea, sneezing, trouble swallowing and sinus pressure.  Eyes: Negative for photophobia and visual disturbance.  Respiratory: Negative for cough, chest tightness,  shortness of breath, wheezing and stridor.  Cardiovascular: Negative for chest pain, palpitations and leg swelling.  Gastrointestinal: Negative for nausea, vomiting, abdominal pain, diarrhea, constipation, blood in stool, abdominal distention and anal bleeding.  Genitourinary: Negative for dysuria, hematuria, flank pain and difficulty urinating.  Musculoskeletal: Negative for myalgias, back pain, joint swelling, arthralgias and  gait problem.  Skin: Negative for color change, pallor, rash and wound.  Neurological: Negative for dizziness, tremors, weakness and light-headedness.  Hematological: Negative for adenopathy. Does not bruise/bleed easily.  Psychiatric/Behavioral: Negative for behavioral problems, confusion, sleep disturbance, dysphoric mood, decreased concentration and agitation.    Physical Exam   BP (!) 143/90   Pulse 91   Temp 98.2 F (36.8 C) (Oral)   Ht 5\' 1"  (1.549 m)   Wt 142 lb (64.4 kg)   SpO2 100%   BMI 26.83 kg/m   Physical Exam  Constitutional:  oriented to person, place, and time. appears well-developed and well-nourished. No distress.  HENT: /AT, PERRLA, no scleral icterus Mouth/Throat: Oropharynx is clear and moist. No oropharyngeal exudate.  Cardiovascular: Normal rate, regular rhythm and normal heart sounds. Exam reveals no gallop and no friction rub.  No murmur heard.  Pulmonary/Chest: Effort normal and breath sounds normal. No respiratory distress.  has no wheezes.  Neck = supple, no nuchal rigidity Abdominal: Soft. Bowel sounds are normal.  exhibits no distension. There is no tenderness.  Lymphadenopathy: no cervical adenopathy. No axillary adenopathy Neurological: alert and oriented to person, place, and time.  Skin: Skin is warm and dry. No rash noted. Darkened nails. Psychiatric: a normal mood and affect.  behavior is normal.  Lab Results  Component Value Date   CD4TCELL 43 07/26/2020   Lab Results  Component Value Date   CD4TABS 393 (L) 07/26/2020   CD4TABS 617 02/09/2020   CD4TABS 532 11/06/2019   Lab Results  Component Value Date   HIV1RNAQUANT <20 05/31/2020   Lab Results  Component Value Date   HEPBSAB NO 12/27/2006   Lab Results  Component Value Date   LABRPR NON-REACTIVE 03/13/2019    CBC Lab Results  Component Value Date   WBC 3.0 (L) 07/26/2020   RBC 2.53 (L) 07/26/2020   HGB 8.1 (L) 07/26/2020   HCT 24.4 (L) 07/26/2020   PLT 151  07/26/2020   MCV 96.4 07/26/2020   MCH 32.0 07/26/2020   MCHC 33.2 07/26/2020   RDW 20.3 (H) 07/26/2020   LYMPHSABS 1.0 07/26/2020   MONOABS 0.2 07/26/2020   EOSABS 0.0 07/26/2020    BMET Lab Results  Component Value Date   NA 137 07/26/2020   K 4.3 07/26/2020   CL 107 07/26/2020   CO2 25 07/26/2020   GLUCOSE 94 07/26/2020   BUN 15 07/26/2020   CREATININE 0.80 07/26/2020   CALCIUM 8.9 07/26/2020   GFRNONAA >60 07/26/2020   GFRAA >60 07/26/2020      Assessment and Plan  Health maintenance = finish chemo in mid October -then can give flu and covid booster.  hiv disease = well controlled. Continue on odefsey. Lower CD 4 count 2/2 leukopenia associated with chemo. Still remains undetectable  Leukopenia = from chemo.   Long term medication management = cr remains stable  Plan to come --- for covid vaccine 3rd dose once she finishes to her regimen

## 2020-08-02 ENCOUNTER — Inpatient Hospital Stay: Payer: BC Managed Care – PPO

## 2020-08-02 ENCOUNTER — Other Ambulatory Visit: Payer: Self-pay

## 2020-08-02 ENCOUNTER — Encounter: Payer: Self-pay | Admitting: Hematology

## 2020-08-02 ENCOUNTER — Inpatient Hospital Stay: Payer: BC Managed Care – PPO | Attending: Hematology | Admitting: Hematology

## 2020-08-02 ENCOUNTER — Other Ambulatory Visit: Payer: Self-pay | Admitting: Hematology

## 2020-08-02 VITALS — BP 137/80 | HR 94 | Temp 98.1°F | Resp 18 | Ht 61.0 in | Wt 140.1 lb

## 2020-08-02 DIAGNOSIS — Z5112 Encounter for antineoplastic immunotherapy: Secondary | ICD-10-CM | POA: Insufficient documentation

## 2020-08-02 DIAGNOSIS — Z95828 Presence of other vascular implants and grafts: Secondary | ICD-10-CM

## 2020-08-02 DIAGNOSIS — C50412 Malignant neoplasm of upper-outer quadrant of left female breast: Secondary | ICD-10-CM

## 2020-08-02 DIAGNOSIS — Z170 Estrogen receptor positive status [ER+]: Secondary | ICD-10-CM | POA: Diagnosis not present

## 2020-08-02 DIAGNOSIS — B20 Human immunodeficiency virus [HIV] disease: Secondary | ICD-10-CM

## 2020-08-02 DIAGNOSIS — Z21 Asymptomatic human immunodeficiency virus [HIV] infection status: Secondary | ICD-10-CM | POA: Diagnosis not present

## 2020-08-02 DIAGNOSIS — C773 Secondary and unspecified malignant neoplasm of axilla and upper limb lymph nodes: Secondary | ICD-10-CM | POA: Diagnosis not present

## 2020-08-02 DIAGNOSIS — Z853 Personal history of malignant neoplasm of breast: Secondary | ICD-10-CM | POA: Diagnosis not present

## 2020-08-02 DIAGNOSIS — D6481 Anemia due to antineoplastic chemotherapy: Secondary | ICD-10-CM | POA: Insufficient documentation

## 2020-08-02 DIAGNOSIS — R7401 Elevation of levels of liver transaminase levels: Secondary | ICD-10-CM | POA: Insufficient documentation

## 2020-08-02 DIAGNOSIS — I10 Essential (primary) hypertension: Secondary | ICD-10-CM | POA: Insufficient documentation

## 2020-08-02 DIAGNOSIS — R634 Abnormal weight loss: Secondary | ICD-10-CM | POA: Diagnosis not present

## 2020-08-02 DIAGNOSIS — Z171 Estrogen receptor negative status [ER-]: Secondary | ICD-10-CM | POA: Diagnosis not present

## 2020-08-02 DIAGNOSIS — D61818 Other pancytopenia: Secondary | ICD-10-CM | POA: Insufficient documentation

## 2020-08-02 DIAGNOSIS — Z5111 Encounter for antineoplastic chemotherapy: Secondary | ICD-10-CM | POA: Insufficient documentation

## 2020-08-02 DIAGNOSIS — D701 Agranulocytosis secondary to cancer chemotherapy: Secondary | ICD-10-CM | POA: Diagnosis not present

## 2020-08-02 LAB — CMP (CANCER CENTER ONLY)
ALT: 22 U/L (ref 0–44)
AST: 25 U/L (ref 15–41)
Albumin: 3.6 g/dL (ref 3.5–5.0)
Alkaline Phosphatase: 146 U/L — ABNORMAL HIGH (ref 38–126)
Anion gap: 6 (ref 5–15)
BUN: 10 mg/dL (ref 8–23)
CO2: 24 mmol/L (ref 22–32)
Calcium: 9.2 mg/dL (ref 8.9–10.3)
Chloride: 104 mmol/L (ref 98–111)
Creatinine: 0.86 mg/dL (ref 0.44–1.00)
GFR, Est AFR Am: >60 mL/min (ref >60)
GFR, Estimated: >60 mL/min (ref >60)
Glucose, Bld: 96 mg/dL (ref 70–99)
Potassium: 4.1 mmol/L (ref 3.5–5.1)
Sodium: 134 mmol/L — ABNORMAL LOW (ref 135–145)
Total Bilirubin: 0.3 mg/dL (ref 0.3–1.2)
Total Protein: 7.2 g/dL (ref 6.5–8.1)

## 2020-08-02 LAB — CBC WITH DIFFERENTIAL (CANCER CENTER ONLY)
Abs Immature Granulocytes: 0.01 10*3/uL (ref 0.00–0.07)
Basophils Absolute: 0 10*3/uL (ref 0.0–0.1)
Basophils Relative: 1 %
Eosinophils Absolute: 0 10*3/uL (ref 0.0–0.5)
Eosinophils Relative: 1 %
HCT: 24.6 % — ABNORMAL LOW (ref 36.0–46.0)
Hemoglobin: 8.2 g/dL — ABNORMAL LOW (ref 12.0–15.0)
Immature Granulocytes: 1 %
Lymphocytes Relative: 53 %
Lymphs Abs: 1.1 10*3/uL (ref 0.7–4.0)
MCH: 31.8 pg (ref 26.0–34.0)
MCHC: 33.3 g/dL (ref 30.0–36.0)
MCV: 95.3 fL (ref 80.0–100.0)
Monocytes Absolute: 0.1 10*3/uL (ref 0.1–1.0)
Monocytes Relative: 6 %
Neutro Abs: 0.8 10*3/uL — ABNORMAL LOW (ref 1.7–7.7)
Neutrophils Relative %: 38 %
Platelet Count: 96 10*3/uL — ABNORMAL LOW (ref 150–400)
RBC: 2.58 MIL/uL — ABNORMAL LOW (ref 3.87–5.11)
RDW: 19.5 % — ABNORMAL HIGH (ref 11.5–15.5)
WBC Count: 2.1 10*3/uL — ABNORMAL LOW (ref 4.0–10.5)
nRBC: 0.9 % — ABNORMAL HIGH (ref 0.0–0.2)

## 2020-08-02 MED ORDER — METHYLPREDNISOLONE SODIUM SUCC 125 MG IJ SOLR
100.0000 mg | Freq: Once | INTRAMUSCULAR | Status: AC
  Administered 2020-08-02: 100 mg via INTRAVENOUS

## 2020-08-02 MED ORDER — FAMOTIDINE IN NACL 20-0.9 MG/50ML-% IV SOLN
INTRAVENOUS | Status: AC
  Filled 2020-08-02: qty 50

## 2020-08-02 MED ORDER — METHYLPREDNISOLONE SODIUM SUCC 125 MG IJ SOLR
INTRAMUSCULAR | Status: AC
  Filled 2020-08-02: qty 2

## 2020-08-02 MED ORDER — SODIUM CHLORIDE 0.9 % IV SOLN
Freq: Once | INTRAVENOUS | Status: AC
  Filled 2020-08-02: qty 250

## 2020-08-02 MED ORDER — DIPHENHYDRAMINE HCL 50 MG/ML IJ SOLN
INTRAMUSCULAR | Status: AC
  Filled 2020-08-02: qty 1

## 2020-08-02 MED ORDER — SODIUM CHLORIDE 0.9 % IV SOLN
80.0000 mg/m2 | Freq: Once | INTRAVENOUS | Status: AC
  Administered 2020-08-02: 132 mg via INTRAVENOUS
  Filled 2020-08-02: qty 22

## 2020-08-02 MED ORDER — DIPHENHYDRAMINE HCL 50 MG/ML IJ SOLN
50.0000 mg | Freq: Once | INTRAMUSCULAR | Status: AC
  Administered 2020-08-02: 50 mg via INTRAVENOUS

## 2020-08-02 MED ORDER — SODIUM CHLORIDE 0.9 % IV SOLN
73.4400 mg | Freq: Once | INTRAVENOUS | Status: AC
  Administered 2020-08-02: 70 mg via INTRAVENOUS
  Filled 2020-08-02: qty 7

## 2020-08-02 MED ORDER — SODIUM CHLORIDE 0.9% FLUSH
10.0000 mL | INTRAVENOUS | Status: DC | PRN
  Administered 2020-08-02: 10 mL
  Filled 2020-08-02: qty 10

## 2020-08-02 MED ORDER — SODIUM CHLORIDE 0.9% FLUSH
10.0000 mL | Freq: Once | INTRAVENOUS | Status: AC
  Administered 2020-08-02: 10 mL
  Filled 2020-08-02: qty 10

## 2020-08-02 MED ORDER — HEPARIN SOD (PORK) LOCK FLUSH 100 UNIT/ML IV SOLN
500.0000 [IU] | Freq: Once | INTRAVENOUS | Status: AC | PRN
  Administered 2020-08-02: 500 [IU]
  Filled 2020-08-02: qty 5

## 2020-08-02 MED ORDER — FAMOTIDINE IN NACL 20-0.9 MG/50ML-% IV SOLN
20.0000 mg | Freq: Once | INTRAVENOUS | Status: AC
  Administered 2020-08-02: 20 mg via INTRAVENOUS

## 2020-08-02 NOTE — Patient Instructions (Signed)
[facility] Discharge Instructions for Patients Receiving Chemotherapy  Today you received the following chemotherapy agents:  Paclitaxel & Carboplatin  To help prevent nausea and vomiting after your treatment, we encourage you to take your nausea medication as prescribed. If you develop nausea and vomiting that is not controlled by your nausea medication, call the clinic.   BELOW ARE SYMPTOMS THAT SHOULD BE REPORTED IMMEDIATELY:  *FEVER GREATER THAN 100.5 F  *CHILLS WITH OR WITHOUT FEVER  NAUSEA AND VOMITING THAT IS NOT CONTROLLED WITH YOUR NAUSEA MEDICATION  *UNUSUAL SHORTNESS OF BREATH  *UNUSUAL BRUISING OR BLEEDING  TENDERNESS IN MOUTH AND THROAT WITH OR WITHOUT PRESENCE OF ULCERS  *URINARY PROBLEMS  *BOWEL PROBLEMS  UNUSUAL RASH Items with * indicate a potential emergency and should be followed up as soon as possible.  Feel free to call the clinic should you have any questions or concerns. The clinic phone number is [phone].  Please show the [facility] at check-in to the Emergency Department and triage nurse.

## 2020-08-07 NOTE — Progress Notes (Signed)
[place]   [phone] [phone]   Clinic Follow up Note   Patient Care Team: [name], MD as PCP - General (Infectious Diseases) [name], MD as PCP - Infectious Diseases (Infectious Diseases) [name], MD as Consulting Physician (General Surgery) [name], MD as Consulting Physician (Hematology) [name], MD as Consulting Physician (Radiation Oncology) [name], RN as Oncology Nurse Navigator [name], RN as Oncology Nurse Navigator  Date of Service:  08/09/2020  CHIEF COMPLAINT: F/u of left breast cancer  SUMMARY OF ONCOLOGIC HISTORY: Oncology History Overview Note  Cancer Staging Malignant neoplasm of upper-outer quadrant of left breast in female, estrogen receptor positive ([place]) Staging form: Breast, AJCC 8th Edition - Clinical stage from 03/01/2020: Stage IV (cT4, cN0, pM1, G3, ER+, PR-, HER2-) - Signed by [name], MD on 03/06/2020    Malignant neoplasm of upper-outer quadrant of left breast in female, estrogen receptor positive ([place])  02/28/2020 Mammogram   Diagnostic Mammogram 02/28/20  IMPRESSION The 3.9 cm ill defined mass in the left breast posterior depth central 4.3cm to the nipple seen on the mediolateral oblique view onlt with associated difssue skin thickening is highly suspicious for malignancy.    03/01/2020 Cancer Staging   Staging form: Breast, AJCC 8th Edition - Clinical stage from 03/01/2020: Stage IV (cT4, cN0, pM1, G3, ER+, PR-, HER2-) - Signed by [name], MD on 03/06/2020   03/01/2020 Initial Biopsy   Diagnosis 03/01/20  1. Breast, left, needle core biopsy, 9 o'clock, 3-4cmfn - INVASIVE DUCTAL CARCINOMA. SEE NOTE 2. Lymph node, needle/core biopsy, right axilla - INVASIVE DUCTAL CARCINOMA. SEE NOTE Diagnosis Note 1. Invasive carcinoma measures 1.5 cm in greatest linear dimension and appears grade 3. Dr. [name] reviewed the case and concurs with the diagnosis. A breast prognostic  profile (ER, PR, Ki-67 and HER2) is pending and will be reported in an addendum. Dr. [name] was notified on 03/04/2020. 2. Carcinoma measures 0.6 cm in greatest linear dimension and appears grade 3. Lymphoid tissue is not identified - this may represent a completely replaced lymph node. A breast prognostic profile (ER, PR, Ki-67 and HER2) is pending and will be reported in an addendum. Dr. [name] reviewed the case and concurs with the diagnosis. Dr. [name] was notified on 03/04/2020.   03/01/2020 Receptors her2   1. PROGNOSTIC INDICATORS Results: IMMUNOHISTOCHEMICAL AND MORPHOMETRIC ANALYSIS PERFORMED MANUALLY The tumor cells are NEGATIVE for Her2 (0). Estrogen Receptor: 20%, POSITIVE, WEAK STAINING INTENSITY Progesterone Receptor: 0%, NEGATIVE Proliferation Marker Ki67: 40% COMMENT: The negative hormone receptor study(ies) in this case has an internal positive control.    2. PROGNOSTIC INDICATORS Results: IMMUNOHISTOCHEMICAL AND MORPHOMETRIC ANALYSIS PERFORMED MANUALLY The tumor cells are NEGATIVE for Her2 (1+). Estrogen Receptor: 10%, POSITIVE, WEAK STAINING INTENSITY Progesterone Receptor: 0%, NEGATIVE Proliferation Marker Ki67: 40% COMMENT: The negative hormone receptor study(ies) in this case has no internal positive control.   03/05/2020 Initial Diagnosis   Malignant neoplasm of upper-outer quadrant of left breast in female, estrogen receptor positive ([place])   03/18/2020 Breast MRI   IMPRESSION: 1. 7 x 9 x 6 cm area of suspicious non masslike enhancement within the central/UPPER RIGHT breast with anterior skin thickening. Given biopsy-proven metastatic RIGHT axillary lymph node, tissue sampling of the anterior and posterior aspects of this non masslike RIGHT breast enhancement is recommended to exclude malignancy. 2. 7 x 6 x 5 cm diffuse masslike and nonmasslike enhancement within the majority of the remaining LEFT breast with diffuse LEFT breast  skin thickening, compatible  with malignancy. Abnormal enhancement is noted along the anterior aspect of the LEFT pectoralis muscle. 3. Three abnormal RIGHT axillary lymph nodes, 1 which is biopsy proven to be metastatic disease. No abnormal LEFT axillary lymph nodes identified.   03/20/2020 Echocardiogram   Baseline ECHO  IMPRESSIONS     1. The patient was reported to be in pain during the study and not able  to participate in this study. All that can be said is that the LV and RV  function are grossly normal. Would recommend to repeat this study when/if  the patient is able to tolerate  the exam.   2. Left ventricular ejection fraction, by estimation, is 60 to 65%. The  left ventricle has normal function. Left ventricular endocardial border  not optimally defined to evaluate regional wall motion. Left ventricular  diastolic function could not be  evaluated.   3. Right ventricular systolic function is normal. The right ventricular  size is normal.   4. The mitral valve was not well visualized. No evidence of mitral valve  regurgitation.   5. The aortic valve was not well visualized. Aortic valve regurgitation  is not visualized.   6. The inferior vena cava is normal in size with greater than 50%  respiratory variability, suggesting right atrial pressure of 3 mmHg.    03/20/2020 Imaging   CT CAP W contrast  IMPRESSION: 1. Small right axillary/subpectoral lymph nodes. Difficult to exclude metastatic disease. 2. Subtle 6 mm low-attenuation lesion in the dome of the liver, not well seen previously. Lesion is too small to characterize. Further evaluation could be performed with MR abdomen without and with contrast, as clinically indicated. 3. Hepatic steatosis. 4. Aortic atherosclerosis (ICD10-I70.0). Coronary artery calcification.     03/20/2020 Imaging   Whole body bone scan  IMPRESSION: 1. Increased activity noted over the right breast, possibly related to the patient's right breast cancer.   2.  Punctate area of increased activity over the left maxillary region, most likely related to sinus disease or dental disease. Increased activity noted over the left mandible also possibly related to dental disease. No other focal bony abnormalities to suggest metastatic disease identified.   03/22/2020 Procedure   PAC placement by Dr [name]    03/22/2020 - 08/16/2020 Chemotherapy   AC q2weeks for 4 cycles 03/22/20-05/03/20 followed by weekly Taxol and Carboplatin q3weeks for 12 weeks starting 05/17/20.               Week 2 Taxol reduced to half dose and CT was dose reduced starting with week 4 due to thrombocytopenia. Given pancytopenia, will change Carboplatin to 50% dose weekly with Taxol 85m/m2 starting with week 7 (06/13/20). Will hold Carboplatin as needed based on labs. Carboplatin increased to 80% starting with week 8. Carboplatin dose reduced for from C11 due to thrombocytopenia and neutropenia. Plan to complete 08/16/20.   03/22/2020 Pathology Results   FINAL MICROSCOPIC DIAGNOSIS:   A. BREAST, RIGHT, PUNCH BIOPSY:  - Carcinoma involving dermal lymphatics.  - See comment.   B. BREAST, LEFT, PUNCH BIOPSY:  - Carcinoma involving dermal lymphatics.  - See comment.   COMMENT:  The morphology is compatible with ductal breast carcinoma.    ADDENDUM:   PROGNOSTIC INDICATOR RESULTS:   Immunohistochemical and morphometric analysis performed manually   The tumor cells are NEGATIVE for Her2 (0%).   Estrogen Receptor:       NEGATIVE, 0%  Progesterone Receptor:   NEGATIVE, 0%  CURRENT THERAPY:  AC q2weeks for 4 cycles 03/22/20-05/03/20 followed by weekly Taxoland Carboplatin q3weeksfor 12 weeks starting 05/17/20.Week 2 Taxol reduced to half dose and CT was dose reduced starting with week 4 due to thrombocytopenia.Given pancytopenia,will change Carboplatin to 50% dose weekly with Taxol $RemoveBe'80mg'gZFtWaBlj$ /m2starting with week 7 (06/13/20). Will hold Carboplatin as needed based on  labs.Carboplatin increased to 80% starting with week 8.Carboplatin dose reduced for C11-12. Plan to complete 08/16/20  INTERVAL HISTORY:  Victoria Coffey is here for a follow up. She presents to the clinic alone. She notes she is doing well and tolerated prior cycle well. She denies any new problems. She felt her energy was adequate to go walking. She denies fever or chills. She brought her COVID19 vaccine card. She notes she is eating and drinking well.     REVIEW OF SYSTEMS:   Constitutional: Denies fevers, chills or abnormal weight loss Eyes: Denies blurriness of vision Ears, nose, mouth, throat, and face: Denies mucositis or sore throat Respiratory: Denies cough, dyspnea or wheezes Cardiovascular: Denies palpitation, chest discomfort or lower extremity swelling Gastrointestinal:  Denies nausea, heartburn or change in bowel habits Skin: Denies abnormal skin rashes Lymphatics: Denies new lymphadenopathy or easy bruising Neurological:Denies numbness, tingling or new weaknesses Behavioral/Psych: Mood is stable, no new changes  All other systems were reviewed with the patient and are negative.  MEDICAL HISTORY:  Past Medical History:  Diagnosis Date  . Breast cancer ([place]) dx'd 1993   left  . History of left breast cancer   . HIV infection ([place])   . Hypertension     SURGICAL HISTORY: Past Surgical History:  Procedure Laterality Date  . BREAST BIOPSY Bilateral 03/22/2020   Procedure: BILATERAL BREAST PUNCH BIOPSIES;  Surgeon: [name], MD;  Location: [facility];  Service: General;  Laterality: Bilateral;  . BREAST LUMPECTOMY    . CESAREAN SECTION    . left breast lumpectomy  1993  . PORTACATH PLACEMENT Right 03/22/2020   Procedure: INSERTION PORT-A-CATH WITH ULTRASOUND GUIDANCE;  Surgeon: [name], MD;  Location: [facility];  Service: General;  Laterality: Right;    I have reviewed the social history and family history with the patient and they are unchanged from previous  note.  ALLERGIES:  is allergic to codeine, other, grapefruit bioflavonoid complex, pomegranate [punica], and shellfish allergy.  MEDICATIONS:  Current Outpatient Medications  Medication Sig Dispense Refill  . acetaminophen (TYLENOL) 500 MG tablet Take 1,000 mg by mouth every 6 (six) hours as needed for moderate pain.     Marland Kitchen emtricitabine-rilpivir-tenofovir AF (ODEFSEY) 200-25-25 MG TABS tablet Take 1 tablet by mouth daily with breakfast. 30 tablet 11  . hydrochlorothiazide (HYDRODIURIL) 25 MG tablet Take 1 tablet (25 mg total) by mouth daily. 30 tablet 11  . lidocaine-prilocaine (EMLA) cream Apply to affected area once 30 g 3  . loratadine (CLARITIN) 10 MG tablet Take 10 mg by mouth daily. Takes 3-4 days before and after treatment    . ondansetron (ZOFRAN) 8 MG tablet Take 1 tablet (8 mg total) by mouth 2 (two) times daily as needed. Start on the third day after chemotherapy. 30 tablet 2  . oxyCODONE (OXY IR/ROXICODONE) 5 MG immediate release tablet Take 1 tablet (5 mg total) by mouth every 6 (six) hours as needed for severe pain. 15 tablet 0  . potassium chloride SA (KLOR-CON) 20 MEQ tablet Take 1 tablet (20 mEq total) by mouth 2 (two) times daily. Take 1 tab twice daily for 3 days then 1 tab  once daily 60 tablet 1  . prochlorperazine (COMPAZINE) 10 MG tablet Take 1 tablet (10 mg total) by mouth every 6 (six) hours as needed (Nausea or vomiting). 30 tablet 2   No current facility-administered medications for this visit.    PHYSICAL EXAMINATION: ECOG PERFORMANCE STATUS: 1 - Symptomatic but completely ambulatory  Vitals:   08/09/20 0848  BP: 126/86  Pulse: 88  Resp: 18  Temp: (!) 97.3 F (36.3 C)  SpO2: 100%   Filed Weights   08/09/20 0848  Weight: 140 lb 4.8 oz (63.6 kg)    Due to COVID19 we will limit examination to appearance. Patient had no complaints.  GENERAL:alert, no distress and comfortable SKIN: skin color normal, no rashes or significant lesions EYES: normal,  Conjunctiva are pink and non-injected, sclera clear  NEURO: alert & oriented x 3 with fluent speech    LABORATORY DATA:  I have reviewed the data as listed CBC Latest Ref Rng & Units 08/09/2020 08/02/2020 07/26/2020  WBC 4.0 - 10.5 K/uL 1.5(L) 2.1(L) 3.0(L)  Hemoglobin 12.0 - 15.0 g/dL 7.7(L) 8.2(L) 8.1(L)  Hematocrit 36 - 46 % 23.0(L) 24.6(L) 24.4(L)  Platelets 150 - 400 K/uL 59(L) 96(L) 151     CMP Latest Ref Rng & Units 08/09/2020 08/02/2020 07/26/2020  Glucose 70 - 99 mg/dL 105(H) 96 94  BUN 8 - 23 mg/dL _0 Creatinine 0.44 - 1.00 mg/dL 0.81 0.86 0.80  Sodium 135 - 145 mmol/L 137 134(L) 137  Potassium 3.5 - 5.1 mmol/L 4.0 4.1 4.3  Chloride 98 - 111 mmol/L 107 104 107  CO2 22 - 32 mmol/L _1 Calcium 8.9 - 10.3 mg/dL 9.1 9.2 8.9  Total Protein 6.5 - 8.1 g/dL 6.9 7.2 6.9  Total Bilirubin 0.3 - 1.2 mg/dL 0.3 0.3 0.2(L)  Alkaline Phos 38 - 126 U/L 144(H) 146(H) 136(H)  AST 15 - 41 U/L _2 ALT 0 - 44 U/L _3 RADIOGRAPHIC STUDIES: I have personally reviewed the radiological images as listed and agreed with the findings in the report. No results found.   ASSESSMENT & PLAN:  Victoria Coffey is a 64 y.o. female with    1.Left breast cancer,cT4N0Mx, inflammatorybreast cancer, triple negative,andright breat cancer in [facility] withaxillary node metastasis, ERweakly+,PR/HER2-, Azucena Kuba -She was diagnosed with left breast cancer in 02/2020.she presented with inflammatory left breast cancer. 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 -further work up showed 9cm nodular area in right breast and skin biopsy confirmed cancer involvement -Staging scans were negative for distant metastasis -Istarted her on neoadjuvantchemotherapy as first-line treatmentwithAdriamycin and Cytoxanwhich she completed 4cycles5/21/21-05/03/20.She then proceeded with weekly Taxoland carboplatinevery 3 weeksstarting  05/17/20-08/09/20. Due to poor tolerance carboplatin was changed to weekly with dose reduction.   -She continues to tolerate treatment well lately with adequate energy, no neuropathy and stable weight.  -Labs reviewed, WBC 1.5, Hg 7.7, plt 59K, ANC 0.6. Will hold chemo today due to cytopenias and proceed with Taxol alone or CT next week. She is agreeable.  -She willproceed with Breast MRI on 08/13/20. She plans to see Dr [name] 08/19/20 to discuss plans for surgery. I discussed she may need plastic surgery given her skin involvement.  -I briefly discussed If she had significant residual disease after surgery, I would recommend adjuvant oral chemo Xeloda for 6 months. If surgery is not an option we do have more systemic treatment options to  control her disease. She would have smaller benefit of antiestrogen therapy treatments.  -F/u with me in 2 weeks   2.Neutropenia, Thrombocytopenia,Secondary to chemo -Carboplatin does has been reduced and neutropenia intermittent. Will hold Carboplatin as needed based on labs.  -Neutropenia did resolve and was able to increase carboplatin dose by 30% starting with C8.  -Recent labs showed new onset thrombocytopenia after C10 (08/02/20) and recurrent neutropenia after C11.  Will dose reduce carboplatin for C11-12.  -Labs today show WBC 1.5, plt 59K, ANC 0.6 (08/09/20). Will postpone C11 one week.  -She plans to proceed with Flu shot and COVID booster (last dose in 04/2020) after chemo. I reviewed infection precautions with her and encouraged her to watch for fever. If present she should go to ED.   3. Anemia, secondary to chemo -Will give blood transfusion for Hg <7, last on 05/27/20 -She is currently on prenatal vitamin. -S/p C11 chemo her Hg dropped to 7.7 today (08/09/20). She is not symptomatic,continue to hold on blood transfusion for now.  4. Weight Loss, Low appetite -F/u with dietician as needed. -Weight mostly stable now, with fluctuation.  5.  H/o Left breast cancer, Dx in 1993s/p lumpectomy, radiation, chemo, Tamoxifen.Genetic Testinghas not been done yet.  6. Comorbidities: HIV(+)Dx 1993, HTN -Her HIV has been undetectable and well controlled. She is currently being seen by ID Dr Graylon Good  7.Mild transaminitis  -Unclear etiology, presents before chemotherapy.Her6/16/21 MRIabdomenwas benign. -Her LFTshave resolved (05/24/20)and she continues to havemildly elevated Alk Phos.  8. Hypokalemia(Resolved) -She can continue oral potassium once daily now.  PLAN: -Labs reviewed, will postpone chemo to next week due to thrombocytopenia and neutropenia  -Lab, flushand chemoTaxol/Carboplatin next week (last dose) -MRI breast week of 08/13/20 -F/u in 2 weeks    No problem-specific Assessment & Plan notes found for this encounter.   No orders of the defined types were placed in this encounter.  All questions were answered. The patient knows to call the clinic with any problems, questions or concerns. No barriers to learning was detected. The total time spent in the appointment was 30 minutes.     [name], MD 08/09/2020   I, Joslyn Devon, am acting as scribe for [name], MD.   I have reviewed the above documentation for accuracy and completeness, and I agree with the above.

## 2020-08-09 ENCOUNTER — Inpatient Hospital Stay: Payer: BC Managed Care – PPO | Admitting: Nutrition

## 2020-08-09 ENCOUNTER — Inpatient Hospital Stay: Payer: BC Managed Care – PPO

## 2020-08-09 ENCOUNTER — Encounter: Payer: Self-pay | Admitting: Hematology

## 2020-08-09 ENCOUNTER — Encounter: Payer: Self-pay | Admitting: Nutrition

## 2020-08-09 ENCOUNTER — Inpatient Hospital Stay (HOSPITAL_BASED_OUTPATIENT_CLINIC_OR_DEPARTMENT_OTHER): Payer: BC Managed Care – PPO | Admitting: Hematology

## 2020-08-09 ENCOUNTER — Encounter: Payer: Self-pay | Admitting: *Deleted

## 2020-08-09 ENCOUNTER — Other Ambulatory Visit: Payer: Self-pay

## 2020-08-09 VITALS — BP 126/86 | HR 88 | Temp 97.3°F | Resp 18 | Ht 61.0 in | Wt 140.3 lb

## 2020-08-09 DIAGNOSIS — Z170 Estrogen receptor positive status [ER+]: Secondary | ICD-10-CM

## 2020-08-09 DIAGNOSIS — C50412 Malignant neoplasm of upper-outer quadrant of left female breast: Secondary | ICD-10-CM | POA: Diagnosis not present

## 2020-08-09 DIAGNOSIS — Z5112 Encounter for antineoplastic immunotherapy: Secondary | ICD-10-CM | POA: Diagnosis not present

## 2020-08-09 DIAGNOSIS — Z95828 Presence of other vascular implants and grafts: Secondary | ICD-10-CM

## 2020-08-09 LAB — CBC WITH DIFFERENTIAL (CANCER CENTER ONLY)
Abs Immature Granulocytes: 0 10*3/uL (ref 0.00–0.07)
Basophils Absolute: 0 10*3/uL (ref 0.0–0.1)
Basophils Relative: 1 %
Eosinophils Absolute: 0 10*3/uL (ref 0.0–0.5)
Eosinophils Relative: 1 %
HCT: 23 % — ABNORMAL LOW (ref 36.0–46.0)
Hemoglobin: 7.7 g/dL — ABNORMAL LOW (ref 12.0–15.0)
Immature Granulocytes: 0 %
Lymphocytes Relative: 51 %
Lymphs Abs: 0.8 10*3/uL (ref 0.7–4.0)
MCH: 32.4 pg (ref 26.0–34.0)
MCHC: 33.5 g/dL (ref 30.0–36.0)
MCV: 96.6 fL (ref 80.0–100.0)
Monocytes Absolute: 0.1 10*3/uL (ref 0.1–1.0)
Monocytes Relative: 5 %
Neutro Abs: 0.6 10*3/uL — ABNORMAL LOW (ref 1.7–7.7)
Neutrophils Relative %: 42 %
Platelet Count: 59 10*3/uL — ABNORMAL LOW (ref 150–400)
RBC: 2.38 MIL/uL — ABNORMAL LOW (ref 3.87–5.11)
RDW: 19.4 % — ABNORMAL HIGH (ref 11.5–15.5)
WBC Count: 1.5 10*3/uL — ABNORMAL LOW (ref 4.0–10.5)
nRBC: 1.4 % — ABNORMAL HIGH (ref 0.0–0.2)

## 2020-08-09 LAB — CMP (CANCER CENTER ONLY)
ALT: 19 U/L (ref 0–44)
AST: 19 U/L (ref 15–41)
Albumin: 3.4 g/dL — ABNORMAL LOW (ref 3.5–5.0)
Alkaline Phosphatase: 144 U/L — ABNORMAL HIGH (ref 38–126)
Anion gap: 4 — ABNORMAL LOW (ref 5–15)
BUN: 11 mg/dL (ref 8–23)
CO2: 26 mmol/L (ref 22–32)
Calcium: 9.1 mg/dL (ref 8.9–10.3)
Chloride: 107 mmol/L (ref 98–111)
Creatinine: 0.81 mg/dL (ref 0.44–1.00)
GFR, Estimated: >60 mL/min (ref >60)
Glucose, Bld: 105 mg/dL — ABNORMAL HIGH (ref 70–99)
Potassium: 4 mmol/L (ref 3.5–5.1)
Sodium: 137 mmol/L (ref 135–145)
Total Bilirubin: 0.3 mg/dL (ref 0.3–1.2)
Total Protein: 6.9 g/dL (ref 6.5–8.1)

## 2020-08-09 MED ORDER — HEPARIN SOD (PORK) LOCK FLUSH 100 UNIT/ML IV SOLN
500.0000 [IU] | Freq: Once | INTRAVENOUS | Status: AC
  Administered 2020-08-09: 500 [IU]
  Filled 2020-08-09: qty 5

## 2020-08-09 MED ORDER — SODIUM CHLORIDE 0.9% FLUSH
10.0000 mL | Freq: Once | INTRAVENOUS | Status: AC
  Administered 2020-08-09: 10 mL
  Filled 2020-08-09: qty 10

## 2020-08-09 NOTE — Progress Notes (Signed)
Patient declined nutrition follow up. Please reschedule if patient has nutrition needs.

## 2020-08-09 NOTE — Patient Instructions (Signed)

## 2020-08-13 ENCOUNTER — Ambulatory Visit (HOSPITAL_COMMUNITY)
Admission: RE | Admit: 2020-08-13 | Discharge: 2020-08-13 | Disposition: A | Discharge status: Home / Self Care | Payer: BC Managed Care – PPO | Source: Ambulatory Visit | Attending: Hematology | Admitting: Hematology

## 2020-08-13 ENCOUNTER — Other Ambulatory Visit: Payer: Self-pay

## 2020-08-13 DIAGNOSIS — Z170 Estrogen receptor positive status [ER+]: Secondary | ICD-10-CM | POA: Diagnosis present

## 2020-08-13 DIAGNOSIS — C50412 Malignant neoplasm of upper-outer quadrant of left female breast: Secondary | ICD-10-CM | POA: Insufficient documentation

## 2020-08-13 MED ORDER — GADOBUTROL 1 MMOL/ML IV SOLN
6.0000 mL | Freq: Once | INTRAVENOUS | Status: AC | PRN
  Administered 2020-08-13: 6 mL via INTRAVENOUS

## 2020-08-14 ENCOUNTER — Encounter: Payer: Self-pay | Admitting: *Deleted

## 2020-08-15 ENCOUNTER — Inpatient Hospital Stay: Payer: BC Managed Care – PPO

## 2020-08-15 ENCOUNTER — Other Ambulatory Visit: Payer: Self-pay

## 2020-08-15 ENCOUNTER — Other Ambulatory Visit: Payer: Self-pay | Admitting: Hematology

## 2020-08-15 DIAGNOSIS — C50412 Malignant neoplasm of upper-outer quadrant of left female breast: Secondary | ICD-10-CM

## 2020-08-15 DIAGNOSIS — Z170 Estrogen receptor positive status [ER+]: Secondary | ICD-10-CM

## 2020-08-15 DIAGNOSIS — Z95828 Presence of other vascular implants and grafts: Secondary | ICD-10-CM

## 2020-08-15 DIAGNOSIS — Z5112 Encounter for antineoplastic immunotherapy: Secondary | ICD-10-CM | POA: Diagnosis not present

## 2020-08-15 LAB — CMP (CANCER CENTER ONLY)
ALT: 18 U/L (ref 0–44)
AST: 23 U/L (ref 15–41)
Albumin: 3.4 g/dL — ABNORMAL LOW (ref 3.5–5.0)
Alkaline Phosphatase: 150 U/L — ABNORMAL HIGH (ref 38–126)
Anion gap: 2 — ABNORMAL LOW (ref 5–15)
BUN: 13 mg/dL (ref 8–23)
CO2: 26 mmol/L (ref 22–32)
Calcium: 9.1 mg/dL (ref 8.9–10.3)
Chloride: 109 mmol/L (ref 98–111)
Creatinine: 0.82 mg/dL (ref 0.44–1.00)
GFR, Estimated: >60 mL/min (ref >60)
Glucose, Bld: 94 mg/dL (ref 70–99)
Potassium: 4.1 mmol/L (ref 3.5–5.1)
Sodium: 137 mmol/L (ref 135–145)
Total Bilirubin: 0.3 mg/dL (ref 0.3–1.2)
Total Protein: 7 g/dL (ref 6.5–8.1)

## 2020-08-15 LAB — CBC WITH DIFFERENTIAL (CANCER CENTER ONLY)
Abs Immature Granulocytes: 0.01 10*3/uL (ref 0.00–0.07)
Basophils Absolute: 0 10*3/uL (ref 0.0–0.1)
Basophils Relative: 0 %
Eosinophils Absolute: 0 10*3/uL (ref 0.0–0.5)
Eosinophils Relative: 1 %
HCT: 24.7 % — ABNORMAL LOW (ref 36.0–46.0)
Hemoglobin: 8.2 g/dL — ABNORMAL LOW (ref 12.0–15.0)
Immature Granulocytes: 1 %
Lymphocytes Relative: 55 %
Lymphs Abs: 0.9 10*3/uL (ref 0.7–4.0)
MCH: 33.2 pg (ref 26.0–34.0)
MCHC: 33.2 g/dL (ref 30.0–36.0)
MCV: 100 fL (ref 80.0–100.0)
Monocytes Absolute: 0.2 10*3/uL (ref 0.1–1.0)
Monocytes Relative: 10 %
Neutro Abs: 0.5 10*3/uL — ABNORMAL LOW (ref 1.7–7.7)
Neutrophils Relative %: 33 %
Platelet Count: 67 10*3/uL — ABNORMAL LOW (ref 150–400)
RBC: 2.47 MIL/uL — ABNORMAL LOW (ref 3.87–5.11)
RDW: 19.8 % — ABNORMAL HIGH (ref 11.5–15.5)
WBC Count: 1.6 10*3/uL — ABNORMAL LOW (ref 4.0–10.5)
nRBC: 0 % (ref 0.0–0.2)

## 2020-08-15 MED ORDER — HEPARIN SOD (PORK) LOCK FLUSH 100 UNIT/ML IV SOLN
500.0000 [IU] | Freq: Once | INTRAVENOUS | Status: AC
  Administered 2020-08-15: 500 [IU]
  Filled 2020-08-15: qty 5

## 2020-08-15 MED ORDER — SODIUM CHLORIDE 0.9% FLUSH
10.0000 mL | Freq: Once | INTRAVENOUS | Status: AC
  Administered 2020-08-15: 10 mL
  Filled 2020-08-15: qty 10

## 2020-08-15 NOTE — Patient Instructions (Signed)

## 2020-08-16 ENCOUNTER — Other Ambulatory Visit: Payer: Self-pay

## 2020-08-16 ENCOUNTER — Other Ambulatory Visit: Payer: BC Managed Care – PPO

## 2020-08-16 ENCOUNTER — Encounter: Payer: Self-pay | Admitting: *Deleted

## 2020-08-16 ENCOUNTER — Inpatient Hospital Stay (HOSPITAL_BASED_OUTPATIENT_CLINIC_OR_DEPARTMENT_OTHER): Payer: BC Managed Care – PPO | Admitting: Hematology

## 2020-08-16 ENCOUNTER — Inpatient Hospital Stay: Payer: BC Managed Care – PPO

## 2020-08-16 ENCOUNTER — Encounter: Payer: Self-pay | Admitting: Hematology

## 2020-08-16 DIAGNOSIS — Z5112 Encounter for antineoplastic immunotherapy: Secondary | ICD-10-CM | POA: Diagnosis not present

## 2020-08-16 DIAGNOSIS — D702 Other drug-induced agranulocytosis: Secondary | ICD-10-CM

## 2020-08-16 DIAGNOSIS — Z170 Estrogen receptor positive status [ER+]: Secondary | ICD-10-CM

## 2020-08-16 DIAGNOSIS — C50412 Malignant neoplasm of upper-outer quadrant of left female breast: Secondary | ICD-10-CM | POA: Diagnosis not present

## 2020-08-16 LAB — CANCER ANTIGEN 27.29: CA 27.29: 53.1 U/mL — ABNORMAL HIGH (ref 0.0–38.6)

## 2020-08-16 NOTE — Progress Notes (Signed)
PLt 67 Anc .5 . Dr. Burr Medico notified. Hold treatment today.  Pt will see Dr. Burr Medico in her office today.

## 2020-08-16 NOTE — Progress Notes (Signed)
[place]   [phone] [phone]   Clinic Follow up Note   Patient Care Team: [name], MD as PCP - General (Infectious Diseases) [name], MD as PCP - Infectious Diseases (Infectious Diseases) [name], MD as Consulting Physician (General Surgery) [name], MD as Consulting Physician (Hematology) [name], MD as Consulting Physician (Radiation Oncology) [name], RN as Oncology Nurse Navigator [name], RN as Oncology Nurse Navigator  Date of Service:  08/16/2020  CHIEF COMPLAINT: F/u of left breast cancer  SUMMARY OF ONCOLOGIC HISTORY: Oncology History Overview Note  Cancer Staging Malignant neoplasm of upper-outer quadrant of left breast in female, estrogen receptor positive ([place]) Staging form: Breast, AJCC 8th Edition - Clinical stage from 03/01/2020: Stage IV (cT4, cN0, pM1, G3, ER+, PR-, HER2-) - Signed by [name], MD on 03/06/2020    Malignant neoplasm of upper-outer quadrant of left breast in female, estrogen receptor positive ([place])  02/28/2020 Mammogram   Diagnostic Mammogram 02/28/20  IMPRESSION The 3.9 cm ill defined mass in the left breast posterior depth central 4.3cm to the nipple seen on the mediolateral oblique view onlt with associated difssue skin thickening is highly suspicious for malignancy.    03/01/2020 Cancer Staging   Staging form: Breast, AJCC 8th Edition - Clinical stage from 03/01/2020: Stage IV (cT4, cN0, pM1, G3, ER+, PR-, HER2-) - Signed by [name], MD on 03/06/2020   03/01/2020 Initial Biopsy   Diagnosis 03/01/20  1. Breast, left, needle core biopsy, 9 o'clock, 3-4cmfn - INVASIVE DUCTAL CARCINOMA. SEE NOTE 2. Lymph node, needle/core biopsy, right axilla - INVASIVE DUCTAL CARCINOMA. SEE NOTE Diagnosis Note 1. Invasive carcinoma measures 1.5 cm in greatest linear dimension and appears grade 3. Dr. [name] reviewed the case and concurs with the diagnosis. A breast prognostic  profile (ER, PR, Ki-67 and HER2) is pending and will be reported in an addendum. Dr. [name] was notified on 03/04/2020. 2. Carcinoma measures 0.6 cm in greatest linear dimension and appears grade 3. Lymphoid tissue is not identified - this may represent a completely replaced lymph node. A breast prognostic profile (ER, PR, Ki-67 and HER2) is pending and will be reported in an addendum. Dr. [name] reviewed the case and concurs with the diagnosis. Dr. [name] was notified on 03/04/2020.   03/01/2020 Receptors her2   1. PROGNOSTIC INDICATORS Results: IMMUNOHISTOCHEMICAL AND MORPHOMETRIC ANALYSIS PERFORMED MANUALLY The tumor cells are NEGATIVE for Her2 (0). Estrogen Receptor: 20%, POSITIVE, WEAK STAINING INTENSITY Progesterone Receptor: 0%, NEGATIVE Proliferation Marker Ki67: 40% COMMENT: The negative hormone receptor study(ies) in this case has an internal positive control.    2. PROGNOSTIC INDICATORS Results: IMMUNOHISTOCHEMICAL AND MORPHOMETRIC ANALYSIS PERFORMED MANUALLY The tumor cells are NEGATIVE for Her2 (1+). Estrogen Receptor: 10%, POSITIVE, WEAK STAINING INTENSITY Progesterone Receptor: 0%, NEGATIVE Proliferation Marker Ki67: 40% COMMENT: The negative hormone receptor study(ies) in this case has no internal positive control.   03/05/2020 Initial Diagnosis   Malignant neoplasm of upper-outer quadrant of left breast in female, estrogen receptor positive ([place])   03/18/2020 Breast MRI   IMPRESSION: 1. 7 x 9 x 6 cm area of suspicious non masslike enhancement within the central/UPPER RIGHT breast with anterior skin thickening. Given biopsy-proven metastatic RIGHT axillary lymph node, tissue sampling of the anterior and posterior aspects of this non masslike RIGHT breast enhancement is recommended to exclude malignancy. 2. 7 x 6 x 5 cm diffuse masslike and nonmasslike enhancement within the majority of the remaining LEFT breast with diffuse LEFT breast  skin thickening, compatible  with malignancy. Abnormal enhancement is noted along the anterior aspect of the LEFT pectoralis muscle. 3. Three abnormal RIGHT axillary lymph nodes, 1 which is biopsy proven to be metastatic disease. No abnormal LEFT axillary lymph nodes identified.   03/20/2020 Echocardiogram   Baseline ECHO  IMPRESSIONS     1. The patient was reported to be in pain during the study and not able  to participate in this study. All that can be said is that the LV and RV  function are grossly normal. Would recommend to repeat this study when/if  the patient is able to tolerate  the exam.   2. Left ventricular ejection fraction, by estimation, is 60 to 65%. The  left ventricle has normal function. Left ventricular endocardial border  not optimally defined to evaluate regional wall motion. Left ventricular  diastolic function could not be  evaluated.   3. Right ventricular systolic function is normal. The right ventricular  size is normal.   4. The mitral valve was not well visualized. No evidence of mitral valve  regurgitation.   5. The aortic valve was not well visualized. Aortic valve regurgitation  is not visualized.   6. The inferior vena cava is normal in size with greater than 50%  respiratory variability, suggesting right atrial pressure of 3 mmHg.    03/20/2020 Imaging   CT CAP W contrast  IMPRESSION: 1. Small right axillary/subpectoral lymph nodes. Difficult to exclude metastatic disease. 2. Subtle 6 mm low-attenuation lesion in the dome of the liver, not well seen previously. Lesion is too small to characterize. Further evaluation could be performed with MR abdomen without and with contrast, as clinically indicated. 3. Hepatic steatosis. 4. Aortic atherosclerosis (ICD10-I70.0). Coronary artery calcification.     03/20/2020 Imaging   Whole body bone scan  IMPRESSION: 1. Increased activity noted over the right breast, possibly related to the patient's right breast cancer.   2.  Punctate area of increased activity over the left maxillary region, most likely related to sinus disease or dental disease. Increased activity noted over the left mandible also possibly related to dental disease. No other focal bony abnormalities to suggest metastatic disease identified.   03/22/2020 Procedure   PAC placement by Dr [name]    03/22/2020 - 08/16/2020 Chemotherapy   AC q2weeks for 4 cycles 03/22/20-05/03/20 followed by weekly Taxol and Carboplatin q3weeks for 12 weeks starting 05/17/20.                  Week 2 Taxol reduced to half dose and CT was dose reduced starting with week 4 due to thrombocytopenia. Given pancytopenia, will change Carboplatin to 50% dose weekly with Taxol 70m/m2 starting with week 7 (06/13/20). Will hold Carboplatin as needed based on labs. Carboplatin increased to 80% starting with week 8. Carboplatin dose reduced for C11 and held with C12. C12 postponed or canceled due to pancytopenia.    03/22/2020 Pathology Results   FINAL MICROSCOPIC DIAGNOSIS:   A. BREAST, RIGHT, PUNCH BIOPSY:  - Carcinoma involving dermal lymphatics.  - See comment.   B. BREAST, LEFT, PUNCH BIOPSY:  - Carcinoma involving dermal lymphatics.  - See comment.   COMMENT:  The morphology is compatible with ductal breast carcinoma.    ADDENDUM:   PROGNOSTIC INDICATOR RESULTS:   Immunohistochemical and morphometric analysis performed manually   The tumor cells are NEGATIVE for Her2 (0%).   Estrogen Receptor:       NEGATIVE, 0%  Progesterone Receptor:   NEGATIVE,  0%    08/13/2020 Breast MRI   IMPRESSION: 1. Interval significant response to treatment of the diffuse non-mass enhancement involving the UPPER RIGHT breast. There is a solitary residual 5 mm indeterminate mass in the UPPER OUTER QUADRANT at MIDDLE depth with plateau kinetics. 2. Minimal response to treatment of the diffuse non-mass enhancement throughout the LEFT breast. 3. Resolution of enhancement of the skin and  nipple-areolar complex of the RIGHT breast. 4. Near complete resolution of enhancement of the skin and nipple-areolar complex of the LEFT breast. Minimal residual nipple-areola complex enhancement persists. 5. The 3 previously identified pathologic RIGHT axillary lymph nodes are now normal in appearance. There is no pathologic lymphadenopathy currently.   08/23/2020 -  Chemotherapy   Immunotherapy Keytruda q3weeks starting 08/23/20 for 1 year treatment.       CURRENT THERAPY:  -AC q2weeks for 4 cycles 03/22/20-05/03/20 followed by weekly Taxoland Carboplatin q3weeksfor 12 weeks starting 05/17/20.Week 2 Taxol reduced to half dose and CT was dose reduced starting with week 4 due to thrombocytopenia.Given pancytopenia,will change Carboplatin to 50% dose weekly with Taxol 42m/m2starting with week 7 (06/13/20). Will hold Carboplatin as needed based on labs.Carboplatin increased to 80% starting with week 8.Carboplatin dose reduced for C11 and held with C12. C12 postponed or canceled due to pancytopenia.  -Keytruda q3weeks starting in 2 weeks for 1 year treatment.   INTERVAL HISTORY:  NMONSERRATH JUNIOis here for a follow up. She notes she is doing well. She remains to have adequate energy and eating. She denies fever or obvious bleeding outside of known gum bleeding with brushing. She is still interested in b/l mastectomy. She is interested in KCharlofor additional treatment.   REVIEW OF SYSTEMS:   Constitutional: Denies fevers, chills or abnormal weight loss Eyes: Denies blurriness of vision Ears, nose, mouth, throat, and face: Denies mucositis or sore throat Respiratory: Denies cough, dyspnea or wheezes Cardiovascular: Denies palpitation, chest discomfort or lower extremity swelling Gastrointestinal:  Denies nausea, heartburn or change in bowel habits Skin: Denies abnormal skin rashes Lymphatics: Denies new lymphadenopathy or easy bruising Neurological:Denies numbness, tingling or new  weaknesses Behavioral/Psych: Mood is stable, no new changes  All other systems were reviewed with the patient and are negative.  MEDICAL HISTORY:  Past Medical History:  Diagnosis Date  . Breast [place]   left  . History of left breast cancer   . HIV infection ([place]   . Hypertension     SURGICAL HISTORY: Past Surgical History:  Procedure Laterality Date  . BREAST BIOPSY Bilateral 03/22/2020   Procedure: BILATERAL BREAST PUNCH BIOPSIES;  Surgeon: BStark Klein MD;  [facility]  Service: General;  Laterality: Bilateral;  . BREAST LUMPECTOMY    . CESAREAN SECTION    . left breast lumpectomy  1993  . PORTACATH PLACEMENT Right 03/22/2020   Procedure: INSERTION PORT-A-CATH WITH ULTRASOUND GUIDANCE;  Surgeon: BStark Klein MD;  [facility]  Service: General;  Laterality: Right;    I have reviewed the social history and family history with the patient and they are unchanged from previous note.  ALLERGIES:  is allergic to codeine, other, grapefruit bioflavonoid complex, pomegranate [punica], and shellfish allergy.  MEDICATIONS:  Current Outpatient Medications  Medication Sig Dispense Refill  . acetaminophen (TYLENOL) 500 MG tablet Take 1,000 mg by mouth every 6 (six) hours as needed for moderate pain.     .Marland Kitchenemtricitabine-rilpivir-tenofovir AF (ODEFSEY) 200-25-25 MG TABS tablet Take 1 tablet by mouth daily with breakfast. 30  tablet 11  . hydrochlorothiazide (HYDRODIURIL) 25 MG tablet Take 1 tablet (25 mg total) by mouth daily. 30 tablet 11  . lidocaine-prilocaine (EMLA) cream Apply to affected area once 30 g 3  . loratadine (CLARITIN) 10 MG tablet Take 10 mg by mouth daily. Takes 3-4 days before and after treatment    . ondansetron (ZOFRAN) 8 MG tablet Take 1 tablet (8 mg total) by mouth 2 (two) times daily as needed. Start on the third day after chemotherapy. 30 tablet 2  . oxyCODONE (OXY IR/ROXICODONE) 5 MG immediate release tablet Take 1 tablet (5 mg total) by  mouth every 6 (six) hours as needed for severe pain. 15 tablet 0  . potassium chloride SA (KLOR-CON) 20 MEQ tablet Take 1 tablet (20 mEq total) by mouth 2 (two) times daily. Take 1 tab twice daily for 3 days then 1 tab once daily 60 tablet 1  . prochlorperazine (COMPAZINE) 10 MG tablet Take 1 tablet (10 mg total) by mouth every 6 (six) hours as needed (Nausea or vomiting). 30 tablet 2   No current facility-administered medications for this visit.    PHYSICAL EXAMINATION: ECOG PERFORMANCE STATUS: 1 - Symptomatic but completely ambulatory Blood pressure 121/86, heart rate 92, respirations 16, temperature 37.1 pulse ox 100% Due to COVID19 we will limit examination to appearance. Patient had no complaints.  GENERAL:alert, no distress and comfortable SKIN: skin color normal, no rashes or significant lesions EYES: normal, Conjunctiva are pink and non-injected, sclera clear  NEURO: alert & oriented x 3 with fluent speech    LABORATORY DATA:  I have reviewed the data as listed CBC Latest Ref Rng & Units 08/15/2020 08/09/2020 08/02/2020  WBC 4.0 - 10.5 K/uL 1.6(L) 1.5(L) 2.1(L)  Hemoglobin 12.0 - 15.0 g/dL 8.2(L) 7.7(L) 8.2(L)  Hematocrit 36 - 46 % 24.7(L) 23.0(L) 24.6(L)  Platelets 150 - 400 K/uL 67(L) 59(L) 96(L)     CMP Latest Ref Rng & Units 08/15/2020 08/09/2020 08/02/2020  Glucose 70 - 99 mg/dL 94 105(H) 96  BUN 8 - 23 mg/dL _0 Creatinine 0.44 - 1.00 mg/dL 0.82 0.81 0.86  Sodium 135 - 145 mmol/L 137 137 134(L)  Potassium 3.5 - 5.1 mmol/L 4.1 4.0 4.1  Chloride 98 - 111 mmol/L 109 107 104  CO2 22 - 32 mmol/L _1 Calcium 8.9 - 10.3 mg/dL 9.1 9.1 9.2  Total Protein 6.5 - 8.1 g/dL 7.0 6.9 7.2  Total Bilirubin 0.3 - 1.2 mg/dL 0.3 0.3 0.3  Alkaline Phos 38 - 126 U/L 150(H) 144(H) 146(H)  AST 15 - 41 U/L _2 ALT 0 - 44 U/L _3 RADIOGRAPHIC STUDIES: I have personally reviewed the radiological images as listed and agreed with the findings in the report. No  results found.   ASSESSMENT & PLAN:  KINZLEIGH KANDLER is a 64 y.o. female with    1.Left breast cancer,cT4N0Mx, inflammatorybreast cancer, triple negative,andright breat cancer in [facility] withaxillary node metastasis, ERweakly+,PR/HER2-, Azucena Kuba -She was diagnosed with left breast cancer in 02/2020.she presented with inflammatory left breast cancer. 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 -further work up showed 9cm nodular area in right breast and skin biopsy confirmed cancer involvement -Staging scans were negative for distant metastasis -Istarted her on neoadjuvantchemotherapy as first-line treatmentwithAdriamycin and Cytoxanwhich she completed 4cycles5/21/21-05/03/20.She then proceeded with weekly Taxoland carboplatinevery 3 weeksstarting 05/17/20-08/09/20. Due to poor tolerance carboplatin was changed toweekly  with dose reduction. -She continues to tolerate treatment well lately with adequate energy, no neuropathy and stable weight except increasing pancytopenia.  -Labs reviewed today, WBC 1.6, Hg 8.2, plt 67K, ANC 0.5. Overall not adequate to proceed with last dose carbo/Taxol for C12. I discussed postponing again or cancelling if her surgery is within a month.  -I reviewed and discussed her Breast MRI from 08/13/20 which showed excellent response to chemo treatment in right breast and right lymphadenopathy. She has had near complete response. However minimal response is indicated for her left breast. I discussed this is likely related to the excessive scar tissue in the left breast, and response is better evaluated by surgical pathology. No-She plans to f/u with Dr [name] on 10/18. I suspect she will offer her surgery with b/l mastectomy.  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 I recommend adding Keytruda q3weeks to complete 1 year treatment. I reviewed possible side effects with her, especially autoimmune related pneumonitis, colitis, endocrine disorders. She voiced good understanding and is agreeable.  Consent obtained today and I gave her print out of the medicine. Plan to start next week.  -Based on date of surgery and labs I may give last dose Taxol with Keytruda next week.   -F/u next week.   2.Neutropenia, Thrombocytopenia,Secondary to chemo -Carboplatin does has been reduced and neutropenia intermittent. Will hold Carboplatin as needed based on labs.  -Neutropeniadid resolve and was able to increasecarboplatin dose by 30% starting with C8. -Recent labs showed new onset thrombocytopenia after C10 (08/02/20) and recurrent neutropenia after C11.  Will dose reduce carboplatin for C11 and hold with C12. -Sheplans to proceed withFlu shotandCOVID booster (last dose in 04/2020) after chemo. I reviewed infection precautions with her and encouraged her to watch for fever. If present she should go to ED.  -Labs today show WBC 1.6, plt 67K and ANC 0.5. Will postpone C12 Taxol for now. I discussed cancelling if her surgery is within a month.   3. Anemia, secondary to chemo -Will give blood transfusion for Hg <7, last on 05/27/20 -She is currently on prenatal vitamin. -Secondary to chemo, she recently has had moderate to significant anemia. She is not symptomatic,continue tohold on blood transfusion for now.   4. Weight Loss, Low appetite -F/u with dietician as needed. -Weight mostly stable now, with fluctuation.  5. H/o Left breast cancer, Dx in 1993s/p lumpectomy, radiation, chemo, Tamoxifen.Genetic Testinghas not been done yet.  6. Comorbidities: HIV(+)Dx 1993, HTN -Her HIV has been undetectable and well controlled. She is currently being seen by ID Dr  Graylon Good  7.Mild transaminitis  -Unclear etiology, presents before chemotherapy.Her6/16/21 MRIabdomenwas benign. -Her LFTshave resolved (05/24/20)and she continues to havemildly elevated Alk Phos.   8. Hypokalemia(Resolved) -She can continue oral potassium once daily now.  PLAN: -Lab, flush, f/u, Taxol (last dose) and Keytruda next week  -Copy note to Dr [name]    No problem-specific Assessment & Plan notes found for this encounter.   No orders of the defined types were placed in this encounter.  All questions were answered. The patient knows to call the clinic with any problems, questions or concerns. No barriers to learning was detected. The total time spent in the appointment was 30 minutes.     [name], MD 08/16/2020   I, Joslyn Devon, am  acting as scribe for [name], MD.   I have reviewed the above documentation for accuracy and completeness, and I agree with the above.

## 2020-08-19 ENCOUNTER — Other Ambulatory Visit: Payer: Self-pay | Admitting: General Surgery

## 2020-08-19 ENCOUNTER — Encounter: Payer: Self-pay | Admitting: *Deleted

## 2020-08-19 ENCOUNTER — Telehealth: Payer: Self-pay | Admitting: Hematology

## 2020-08-19 DIAGNOSIS — C50111 Malignant neoplasm of central portion of right female breast: Secondary | ICD-10-CM

## 2020-08-19 NOTE — Telephone Encounter (Signed)
Scheduled per 10/15 los. Pt is aware of appt times and date.

## 2020-08-20 NOTE — Progress Notes (Signed)
[place] OFFICE PROGRESS NOTE  Victoria Basques, MD [address] [address]  DIAGNOSIS: F/u of left breast cancer  Oncology History Overview Note  Cancer Staging Malignant neoplasm of upper-outer quadrant of left breast in female, estrogen receptor positive ([place]) Staging form: Breast, AJCC 8th Edition - Clinical stage from 03/01/2020: Stage IV (cT4, cN0, pM1, G3, ER+, PR-, HER2-) - Signed by [name], MD on 03/06/2020    Malignant neoplasm of upper-outer quadrant of left breast in female, estrogen receptor positive ([name])  02/28/2020 Mammogram   Diagnostic Mammogram 02/28/20  IMPRESSION The 3.9 cm ill defined mass in the left breast posterior depth central 4.3cm to the nipple seen on the mediolateral oblique view onlt with associated difssue skin thickening is highly suspicious for malignancy.    03/01/2020 Cancer Staging   Staging form: Breast, AJCC 8th Edition - Clinical stage from 03/01/2020: Stage IIIC (cT4, cN0, cM0, G3, ER+, PR-, HER2-) - Signed by [name], MD on 08/22/2020   03/01/2020 Initial Biopsy   Diagnosis 03/01/20  1. Breast, left, needle core biopsy, 9 o'clock, 3-4cmfn - INVASIVE DUCTAL CARCINOMA. SEE NOTE 2. Lymph node, needle/core biopsy, right axilla - INVASIVE DUCTAL CARCINOMA. SEE NOTE Diagnosis Note 1. Invasive carcinoma measures 1.5 cm in greatest linear dimension and appears grade 3. Dr. [name] reviewed the case and concurs with the diagnosis. A breast prognostic profile (ER, PR, Ki-67 and HER2) is pending and will be reported in an addendum. Dr. [name] was notified on 03/04/2020. 2. Carcinoma measures 0.6 cm in greatest linear dimension and appears grade 3. Lymphoid tissue is not identified - this may represent a completely replaced lymph node. A breast prognostic profile (ER, PR, Ki-67 and HER2) is pending and will be reported in an addendum. Dr. [name] reviewed the case and concurs with the diagnosis. Dr. [name] was  notified on 03/04/2020.   03/01/2020 Receptors her2   1. PROGNOSTIC INDICATORS Results: IMMUNOHISTOCHEMICAL AND MORPHOMETRIC ANALYSIS PERFORMED MANUALLY The tumor cells are NEGATIVE for Her2 (0). Estrogen Receptor: 20%, POSITIVE, WEAK STAINING INTENSITY Progesterone Receptor: 0%, NEGATIVE Proliferation Marker Ki67: 40% COMMENT: The negative hormone receptor study(ies) in this case has an internal positive control.    2. PROGNOSTIC INDICATORS Results: IMMUNOHISTOCHEMICAL AND MORPHOMETRIC ANALYSIS PERFORMED MANUALLY The tumor cells are NEGATIVE for Her2 (1+). Estrogen Receptor: 10%, POSITIVE, WEAK STAINING INTENSITY Progesterone Receptor: 0%, NEGATIVE Proliferation Marker Ki67: 40% COMMENT: The negative hormone receptor study(ies) in this case has no internal positive control.   03/05/2020 Initial Diagnosis   Malignant neoplasm of upper-outer quadrant of left breast in female, estrogen receptor positive ([name])   03/18/2020 Breast MRI   IMPRESSION: 1. 7 x 9 x 6 cm area of suspicious non masslike enhancement within the central/UPPER RIGHT breast with anterior skin thickening. Given biopsy-proven metastatic RIGHT axillary lymph node, tissue sampling of the anterior and posterior aspects of this non masslike RIGHT breast enhancement is recommended to exclude malignancy. 2. 7 x 6 x 5 cm diffuse masslike and nonmasslike enhancement within the majority of the remaining LEFT breast with diffuse LEFT breast skin thickening, compatible with malignancy. Abnormal enhancement is noted along the anterior aspect of the LEFT pectoralis muscle. 3. Three abnormal RIGHT axillary lymph nodes, 1 which is biopsy proven to be metastatic disease. No abnormal LEFT axillary lymph nodes identified.   03/20/2020 Echocardiogram   Baseline ECHO  IMPRESSIONS     1. The patient was reported to be in pain during the study and not able  to participate in this study. All that can be said is that the LV and RV   function are grossly normal. Would recommend to repeat this study when/if  the patient is able to tolerate  the exam.   2. Left ventricular ejection fraction, by estimation, is 60 to 65%. The  left ventricle has normal function. Left ventricular endocardial border  not optimally defined to evaluate regional wall motion. Left ventricular  diastolic function could not be  evaluated.   3. Right ventricular systolic function is normal. The right ventricular  size is normal.   4. The mitral valve was not well visualized. No evidence of mitral valve  regurgitation.   5. The aortic valve was not well visualized. Aortic valve regurgitation  is not visualized.   6. The inferior vena cava is normal in size with greater than 50%  respiratory variability, suggesting right atrial pressure of 3 mmHg.    03/20/2020 Imaging   CT CAP W contrast  IMPRESSION: 1. Small right axillary/subpectoral lymph nodes. Difficult to exclude metastatic disease. 2. Subtle 6 mm low-attenuation lesion in the dome of the liver, not well seen previously. Lesion is too small to characterize. Further evaluation could be performed with MR abdomen without and with contrast, as clinically indicated. 3. Hepatic steatosis. 4. Aortic atherosclerosis (ICD10-I70.0). Coronary artery calcification.     03/20/2020 Imaging   Whole body bone scan  IMPRESSION: 1. Increased activity noted over the right breast, possibly related to the patient's right breast cancer.   2. Punctate area of increased activity over the left maxillary region, most likely related to sinus disease or dental disease. Increased activity noted over the left mandible also possibly related to dental disease. No other focal bony abnormalities to suggest metastatic disease identified.   03/22/2020 Procedure   PAC placement by Dr [name]    03/22/2020 - 08/16/2020 Chemotherapy   AC q2weeks for 4 cycles 03/22/20-05/03/20 followed by weekly Taxol and Carboplatin  q3weeks for 12 weeks starting 05/17/20.                  Week 2 Taxol reduced to half dose and CT was dose reduced starting with week 4 due to thrombocytopenia. Given pancytopenia, will change Carboplatin to 50% dose weekly with Taxol 25m/m2 starting with week 7 (06/13/20). Will hold Carboplatin as needed based on labs. Carboplatin increased to 80% starting with week 8. Carboplatin dose reduced for C11 and held with C12. C12 postponed or canceled due to pancytopenia.    03/22/2020 Pathology Results   FINAL MICROSCOPIC DIAGNOSIS:   A. BREAST, RIGHT, PUNCH BIOPSY:  - Carcinoma involving dermal lymphatics.  - See comment.   B. BREAST, LEFT, PUNCH BIOPSY:  - Carcinoma involving dermal lymphatics.  - See comment.   COMMENT:  The morphology is compatible with ductal breast carcinoma.    ADDENDUM:   PROGNOSTIC INDICATOR RESULTS:   Immunohistochemical and morphometric analysis performed manually   The tumor cells are NEGATIVE for Her2 (0%).   Estrogen Receptor:       NEGATIVE, 0%  Progesterone Receptor:   NEGATIVE, 0%    08/13/2020 Breast MRI   IMPRESSION: 1. Interval significant response to treatment of the diffuse non-mass enhancement involving the UPPER RIGHT breast. There is a solitary residual 5 mm indeterminate mass in the UPPER OUTER QUADRANT at MIDDLE depth with plateau kinetics. 2. Minimal response to treatment of the diffuse non-mass enhancement throughout the LEFT breast. 3. Resolution of enhancement of the skin and nipple-areolar complex of  the RIGHT breast. 4. Near complete resolution of enhancement of the skin and nipple-areolar complex of the LEFT breast. Minimal residual nipple-areola complex enhancement persists. 5. The 3 previously identified pathologic RIGHT axillary lymph nodes are now normal in appearance. There is no pathologic lymphadenopathy currently.   08/23/2020 -  Chemotherapy   Immunotherapy Keytruda q3weeks starting 08/23/20 for 1 year treatment.       CURRENT THERAPY: -AC q2weeks for 4 cycles 03/22/20-05/03/20 followed by weekly Taxoland Carboplatin q3weeksfor 12 weeks starting 05/17/20.Week 2 Taxol reduced to half dose and CT was dose reduced starting with week 4 due to thrombocytopenia.Given pancytopenia,will change Carboplatin to 50% dose weekly with Taxol 69m/m2starting with week 7 (06/13/20). Will hold Carboplatin as needed based on labs.Carboplatin increased to 80% starting with week 8.Carboplatin dose reduced for C11 and held with C12. C12 postponed to 08/23/20 at 60 mg/m2. -Keytruda q3weeks starting in 2 weeks for 1 year treatment. First dose expected on 08/23/20  INTERVAL HISTORY: NBryttney NetzerGuess 64y.o. female returns to the clinic today for a follow-up visit.  The patient is feeling well today without any concerning complaints. She has tolerated her treatment well except for taste alterations and weight loss. At her last appointment on 08/16/2020, she was scheduled to receive cycle #12 of her treatment.  Due to thrombocytopenia and neutropenia, the patient's treatment was held.  The patient was then referred to surgery for consideration of bilateral mastectomies.  She had an appointment with Dr. BBarry Dieneson 08/19/2020.  The plan is to tentatively plan on bilateral mastectomies on 09/19/20.    Otherwise the patient denies any recent fever, chills, or night sweats.  She denies any nausea, vomiting, diarrhea, or constipation.  She denies any peripheral neuropathy.  She denies any fatigue and states that her energy is "good".  Dr. FBurr Medicohad recommended that the patient start on [facility]proceeding with her last weekly dose of Taxol today if her labs permit.  She is here today for evaluation, repeat blood work, and to consider receiving her first dose of Keytruda.  MEDICAL HISTORY: Past Medical History:  Diagnosis Date  . [place]   [place]  . History of left breast cancer   . [place] infection [place]    . Hypertension     ALLERGIES:  is allergic to codeine, other, grapefruit bioflavonoid complex, pomegranate [punica], and shellfish allergy.  MEDICATIONS:  Current Outpatient Medications  Medication Sig Dispense Refill  . acetaminophen (TYLENOL) 500 MG tablet Take 1,000 mg by mouth every 6 (six) hours as needed for moderate pain.     .Marland Kitchenemtricitabine-rilpivir-tenofovir AF (ODEFSEY) 200-25-25 MG TABS tablet Take 1 tablet by mouth daily with breakfast. 30 tablet 11  . hydrochlorothiazide (HYDRODIURIL) 25 MG tablet Take 1 tablet (25 mg total) by mouth daily. 30 tablet 11  . lidocaine-prilocaine (EMLA) cream Apply to affected area once 30 g 3  . loratadine (CLARITIN) 10 MG tablet Take 10 mg by mouth daily. Takes 3-4 days before and after treatment    . ondansetron (ZOFRAN) 8 MG tablet Take 1 tablet (8 mg total) by mouth 2 (two) times daily as needed. Start on the third day after chemotherapy. 30 tablet 2  . oxyCODONE (OXY IR/ROXICODONE) 5 MG immediate release tablet Take 1 tablet (5 mg total) by mouth every 6 (six) hours as needed for severe pain. 15 tablet 0  . potassium chloride SA (KLOR-CON) 20 MEQ tablet Take 1 tablet (20 mEq total) by mouth 2 (two) times  daily. Take 1 tab twice daily for 3 days then 1 tab once daily 60 tablet 1  . prochlorperazine (COMPAZINE) 10 MG tablet Take 1 tablet (10 mg total) by mouth every 6 (six) hours as needed (Nausea or vomiting). 30 tablet 2   No current facility-administered medications for this visit.   Facility-Administered Medications Ordered in Other Visits  Medication Dose Route Frequency Provider Last Rate Last Admin  . famotidine (PEPCID) IVPB 20 mg premix  20 mg Intravenous Once [name], MD 100 mL/hr at 08/23/20 1202 20 mg at 08/23/20 1202  . heparin lock flush 100 unit/mL  500 Units Intracatheter Once PRN [name], MD      . PACLitaxel (TAXOL) 102 mg in sodium chloride 0.9 % 250 mL chemo infusion (</= 76m/m2)  60 mg/m2 (Order-Specific) Intravenous  Once FTruitt Merle MD      . pembrolizumab ([facility] 200 mg in sodium chloride 0.9 % 50 mL chemo infusion  200 mg Intravenous Once FTruitt Merle MD      . sodium chloride flush (NS) 0.9 % injection 10 mL  10 mL Intracatheter PRN FTruitt Merle MD        SURGICAL HISTORY:  Past Surgical History:  Procedure Laterality Date  . BREAST BIOPSY Bilateral 03/22/2020   Procedure: BILATERAL BREAST PUNCH BIOPSIES;  Surgeon: BStark Klein MD;  [place] [facility]  Service: General;  Laterality: Bilateral;  . BREAST LUMPECTOMY    . CESAREAN SECTION    . left breast lumpectomy  1993  . PORTACATH PLACEMENT Right 03/22/2020   Procedure: INSERTION PORT-A-CATH WITH ULTRASOUND GUIDANCE;  Surgeon: BStark Klein MD;  [place] [facility]  Service: General;  Laterality: Right;    REVIEW OF SYSTEMS:   Review of Systems  Constitutional: Negative for appetite change, chills, fatigue, fever and unexpected weight change.  HENT: Positive for taste alterations. Negative for mouth sores, nosebleeds, sore throat and trouble swallowing.   Eyes: Negative for eye problems and icterus.  Respiratory: Negative for cough, hemoptysis, shortness of breath and wheezing.   Cardiovascular: Negative for chest pain and leg swelling.  Gastrointestinal: Negative for abdominal pain, constipation, diarrhea, nausea and vomiting.  Genitourinary: Negative for bladder incontinence, difficulty urinating, dysuria, frequency and hematuria.   Musculoskeletal: Negative for back pain, gait problem, neck pain and neck stiffness.  Skin: Negative for itching and rash.  Neurological: Negative for dizziness, extremity weakness, gait problem, headaches, light-headedness and seizures.  Hematological: Negative for adenopathy. Does not bruise/bleed easily.  Psychiatric/Behavioral: Negative for confusion, depression and sleep disturbance. The patient is not nervous/anxious.     PHYSICAL EXAMINATION:  Blood pressure (!) 132/91, pulse 88, temperature (!) 97.2 F (36.2  C), temperature source Tympanic, resp. rate 15, height '5\' 1"'  (1.549 m), weight 137 lb 1.6 oz (62.2 kg), SpO2 100 %.  ECOG PERFORMANCE STATUS: 1 - Symptomatic but completely ambulatory  Physical Exam  Constitutional: Oriented to person, place, and time and well-developed, well-nourished, and in no distress.  HENT:  Head: Normocephalic and atraumatic.  Mouth/Throat: Oropharynx is clear and moist. No oropharyngeal exudate.  Eyes: Conjunctivae are normal. Right eye exhibits no discharge. Left eye exhibits no discharge. No scleral icterus.  Neck: Normal range of motion. Neck supple.  Cardiovascular: Normal rate, regular rhythm, normal heart sounds and intact distal pulses.   Pulmonary/Chest: Effort normal and breath sounds normal. No respiratory distress. No wheezes. No rales.  Abdominal: Soft. Bowel sounds are normal. Exhibits no distension and no mass. There is no tenderness.  Musculoskeletal: Normal range of  motion. Exhibits no edema.  Lymphadenopathy:    No cervical adenopathy.  Neurological: Alert and oriented to person, place, and time. Exhibits normal muscle tone. Gait normal. Coordination normal.  Skin: Skin is warm and dry. No rash noted. Not diaphoretic. No erythema. No pallor.  Psychiatric: Mood, memory and judgment normal.  Breast: Exam deferred today.  Vitals reviewed.  LABORATORY DATA: Lab Results  Component Value Date   WBC 2.2 (L) 08/23/2020   HGB 8.3 (L) 08/23/2020   HCT 25.4 (L) 08/23/2020   MCV 103.7 (H) 08/23/2020   PLT 132 (L) 08/23/2020      Chemistry      Component Value Date/Time   NA 138 08/23/2020 0947   K 4.3 08/23/2020 0947   CL 111 08/23/2020 0947   CO2 22 08/23/2020 0947   BUN 14 08/23/2020 0947   CREATININE 0.83 08/23/2020 0947   CREATININE 0.82 02/09/2020 0855      Component Value Date/Time   CALCIUM 9.2 08/23/2020 0947   ALKPHOS 175 (H) 08/23/2020 0947   AST 23 08/23/2020 0947   ALT 17 08/23/2020 0947   BILITOT <0.2 (L) 08/23/2020 0947        RADIOGRAPHIC STUDIES:  MR BREAST BILATERAL W WO CONTRAST INC CAD  Result Date: 08/13/2020 CLINICAL DATA:  64 year old with 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 Inflammatory breast cancer clinically with associated skin thickening. She also has biopsy-proven RIGHT axillary node metastasis and pretreatment MRI demonstrated extensive non-mass enhancement throughout the UPPER RIGHT breast with associated skin thickening. Punch biopsies of both breasts in May, 2021, revealed carcinoma involving the dermal lymphatics bilaterally. The patient underwent neoadjuvant chemotherapy, and MRI is performed to evaluate treatment response. Personal history of malignant lumpectomy of the LEFT breast in 1993. LABS:  Not applicable. EXAM: BILATERAL BREAST MRI WITH AND WITHOUT CONTRAST TECHNIQUE: Multiplanar, multisequence MR images of both breasts were obtained prior to and following the intravenous administration of 6 ml of Gadavist. Three-dimensional MR images were rendered by post-processing of the original MR data on an independent workstation. The three-dimensional MR images were interpreted, and findings are reported in the following complete MRI report for this study. Three dimensional images were evaluated at the independent interpreting workstation using the DynaCAD thin client. COMPARISON:  Pre treatment BILATERAL Breast MRI 03/18/2020. Multiple prior mammograms and breast ultrasounds from [facility] mammography. FINDINGS: Breast composition: c. Heterogeneous fibroglandular tissue. Background parenchymal enhancement: Mild. Right breast: The extensive non-mass enhancement throughout the UPPER breast identified on pretreatment MRI has remarkably improved. There is a residual 5 x 5 x 5 mm mass in the UPPER OUTER QUADRANT at MIDDLE depth which demonstrates plateau kinetics. The enhancement of  the nipple-areola complex identified previously has resolved. There is no evidence of skin enhancement currently. No mass or abnormal enhancement elsewhere. Left breast: Stippled non-mass enhancement throughout the breast in all 4 quadrants identified on pretreatment MRI has shown minimal improvement, though persists diffusely throughout the breast. The diffuse skin enhancement and enhancement of the nipple-areola complex identified previously has nearly completely resolved. There is minimal residual enhancement of the nipple-areola complex. No new mass or abnormal enhancement elsewhere. Lymph nodes: The 3 pathologic RIGHT axillary lymph nodes identified previously are now normal in appearance. There is no pathologic axillary or internal mammary lymphadenopathy on either side currently. Ancillary findings:  None. IMPRESSION: 1. Interval significant response to treatment of the diffuse non-mass enhancement involving  the UPPER RIGHT breast. There is a solitary residual 5 mm indeterminate mass in the UPPER OUTER QUADRANT at MIDDLE depth with plateau kinetics. 2. Minimal response to treatment of the diffuse non-mass enhancement throughout the LEFT breast. 3. Resolution of enhancement of the skin and nipple-areolar complex of the RIGHT breast. 4. Near complete resolution of enhancement of the skin and nipple-areolar complex of the LEFT breast. Minimal residual nipple-areola complex enhancement persists. 5. The 3 previously identified pathologic RIGHT axillary lymph nodes are now normal in appearance. There is no pathologic lymphadenopathy currently. RECOMMENDATION: 1. Treatment plan. 2. If a RIGHT breast sparing procedure is contemplated, then MRI guided biopsy of the solitary mass in the [place] at MIDDLE depth is recommended. 3. If a LEFT breast sparing procedure is contemplated, then MRI guided biopsy of the SUPERIOR and INFERIOR extent of the residual non-mass enhancement is recommended. BI-RADS CATEGORY   6: Known biopsy-proven malignancy. Electronically Signed   By: Evangeline Dakin M.D.   On: 08/13/2020 15:25     ASSESSMENT/PLAN:  Victoria Coffey is a 64 y.o. female with   1.Left breast cancer,cT4N0Mx, inflammatorybreast cancer, triple negative,andright breat cancer in UOQ withaxillary node metastasis, ERweakly+,PR/HER2-, Azucena Kuba -She was diagnosed with left breast cancer in 02/2020.she presented with inflammatory left breast cancer. 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 -further work up showed 9cm nodular area in right breast and skin biopsy confirmed cancer involvement -Staging scans were negative for distant metastasis -Dr. Verline Lema her on neoadjuvantchemotherapy as first-line treatmentwithAdriamycin and Cytoxanwhich she completed 4cycles5/21/21-05/03/20.She then proceeded with weekly Taxoland carboplatinevery 3 weeksstarting 05/17/20-08/09/20. Due to poor tolerance carboplatin was changed toweekly with dose reduction. -She continued to tolerate treatment well lately with adequate energy, no neuropathy and stable weight except increasing pancytopenia.  -At her last appointment, Dr. Burr Medico reviewed the results of her breast MRI from 08/13/20 which showed excellent response to chemo treatment in right breast and right lymphadenopathy. She has had near complete response. However minimal response is indicated for her left breast. Dr. Burr Medico discussed with the patient that this may be related to excessive scar tissue in the left breast, and response is better evaluated by surgical pathology. -The patient was then referred to Dr. [name] from surgery. She had an appointment on 08/19/20 and the plan is to have bilateral mastectomies performed, tentatively scheduled for 09/19/20. -At her last appointment, Dr. Burr Medico recommended adding Ballard Russell to complete 1 year treatment. She is scheduled to receive her first dose today.   -Labs from today (08/23/20) show WBC 2.2, Hg 8.3, plt 132k. She did not receive cycle #12 last week.  -I reviewed the labs with Dr. Burr Medico who recommends that she proceed with her first dose of [place] today and her last taxol dose reduced to 60 mg/m2 today.   -Due to her tentative surgery date on 09/19/20, we will skip her next dose of Keytruda in 3 weeks and see her back in 6 weeks for labs, flush, return visit, and cycle #2 of Keytruda.    2.Neutropenia, Thrombocytopenia,Secondary to chemo -Carboplatin does has been reduced and neutropenia intermittent. Dr. Burr Medico recommended holding Carboplatin as needed based on labs.  -Neutropeniadid resolve and Dr. Burr Medico was able to increasecarboplatin dose by 30% starting with C8. -Recent labs showed new onset thrombocytopenia after C10 (08/02/20) and recurrent neutropenia after C11.Dr. Burr Medico dose reduced carboplatin for C11 and hold with C12. -Sheplans to proceed withFlu shotandCOVID boosterafter chemo. Dr. Burr Medico previously  reviewed infection precautions with her and encouraged her to watch for fever. If present she should go to ED.  -Labs today show WBC 2.2, plt 132K and ANC 0.8.  -Per Dr. Burr Medico, she is ok to treat with dose reduced taxol 60 mg/m2 and Keytruda 200 mg IV today.   3. Anemia, secondary to chemo -Will give blood transfusion for Hg <7, last on 05/27/20 -She is currently on prenatal vitamin. -Secondary to chemo, she recently has had moderate to significant anemia. She is not symptomatic,continue tohold on blood transfusion for now.   4. Weight Loss, Low appetite -F/u with dietician as needed. -Weight mostly stable now, with fluctuation.  5. H/o Left breast cancer, Dx in 1993s/p lumpectomy, radiation, chemo, Tamoxifen.Genetic Testinghas not been done yet.  6. Comorbidities: HIV(+)Dx 1993, HTN -Her HIV has been undetectable and well controlled. She is currently being seen by ID Dr Graylon Good  7.Mild transaminitis   -Unclear etiology, presents before chemotherapy.Her6/16/21 MRIabdomenwas benign. -Her LFTshave resolved (05/24/20)and she continues to havemildly elevated Alk Phos.   8. Hypokalemia(Resolved) -She can continue oral potassium once daily now.  PLAN: -Labs reviewed with Dr. Burr Medico to proceed with Cycle #1 of Keytruda and dose reduced taxol today at 60 mg/m2.  -tentative surgery date 09/19/20 under the care of Dr. [name].  -F/U in 6 weeks with cycle #2 of Keytruda 10/04/20. We will skip the Keytruda dose on 09/13/20 due to her upcoming surgery.     No orders of the defined types were placed in this encounter.    Lamine Laton L Illene Sweeting, PA-C 08/23/20

## 2020-08-23 ENCOUNTER — Encounter: Payer: Self-pay | Admitting: *Deleted

## 2020-08-23 ENCOUNTER — Other Ambulatory Visit: Payer: BC Managed Care – PPO

## 2020-08-23 ENCOUNTER — Inpatient Hospital Stay: Payer: BC Managed Care – PPO

## 2020-08-23 ENCOUNTER — Ambulatory Visit: Payer: BC Managed Care – PPO | Admitting: Physician Assistant

## 2020-08-23 ENCOUNTER — Encounter: Payer: Self-pay | Admitting: Physician Assistant

## 2020-08-23 ENCOUNTER — Inpatient Hospital Stay (HOSPITAL_BASED_OUTPATIENT_CLINIC_OR_DEPARTMENT_OTHER): Payer: BC Managed Care – PPO | Admitting: Physician Assistant

## 2020-08-23 ENCOUNTER — Other Ambulatory Visit: Payer: Self-pay

## 2020-08-23 VITALS — BP 132/91 | HR 88 | Temp 97.2°F | Resp 15 | Ht 61.0 in | Wt 137.1 lb

## 2020-08-23 DIAGNOSIS — C50412 Malignant neoplasm of upper-outer quadrant of left female breast: Secondary | ICD-10-CM

## 2020-08-23 DIAGNOSIS — Z170 Estrogen receptor positive status [ER+]: Secondary | ICD-10-CM

## 2020-08-23 DIAGNOSIS — Z95828 Presence of other vascular implants and grafts: Secondary | ICD-10-CM

## 2020-08-23 DIAGNOSIS — D696 Thrombocytopenia, unspecified: Secondary | ICD-10-CM

## 2020-08-23 DIAGNOSIS — Z5112 Encounter for antineoplastic immunotherapy: Secondary | ICD-10-CM | POA: Diagnosis not present

## 2020-08-23 LAB — CMP (CANCER CENTER ONLY)
ALT: 17 U/L (ref 0–44)
AST: 23 U/L (ref 15–41)
Albumin: 3.6 g/dL (ref 3.5–5.0)
Alkaline Phosphatase: 175 U/L — ABNORMAL HIGH (ref 38–126)
Anion gap: 5 (ref 5–15)
BUN: 14 mg/dL (ref 8–23)
CO2: 22 mmol/L (ref 22–32)
Calcium: 9.2 mg/dL (ref 8.9–10.3)
Chloride: 111 mmol/L (ref 98–111)
Creatinine: 0.83 mg/dL (ref 0.44–1.00)
GFR, Estimated: >60 mL/min (ref >60)
Glucose, Bld: 93 mg/dL (ref 70–99)
Potassium: 4.3 mmol/L (ref 3.5–5.1)
Sodium: 138 mmol/L (ref 135–145)
Total Bilirubin: <0.2 mg/dL — ABNORMAL LOW (ref 0.3–1.2)
Total Protein: 7.1 g/dL (ref 6.5–8.1)

## 2020-08-23 LAB — CBC WITH DIFFERENTIAL (CANCER CENTER ONLY)
Abs Immature Granulocytes: 0 10*3/uL (ref 0.00–0.07)
Basophils Absolute: 0 10*3/uL (ref 0.0–0.1)
Basophils Relative: 0 %
Eosinophils Absolute: 0 10*3/uL (ref 0.0–0.5)
Eosinophils Relative: 1 %
HCT: 25.4 % — ABNORMAL LOW (ref 36.0–46.0)
Hemoglobin: 8.3 g/dL — ABNORMAL LOW (ref 12.0–15.0)
Immature Granulocytes: 0 %
Lymphocytes Relative: 50 %
Lymphs Abs: 1.1 10*3/uL (ref 0.7–4.0)
MCH: 33.9 pg (ref 26.0–34.0)
MCHC: 32.7 g/dL (ref 30.0–36.0)
MCV: 103.7 fL — ABNORMAL HIGH (ref 80.0–100.0)
Monocytes Absolute: 0.2 10*3/uL (ref 0.1–1.0)
Monocytes Relative: 11 %
Neutro Abs: 0.8 10*3/uL — ABNORMAL LOW (ref 1.7–7.7)
Neutrophils Relative %: 38 %
Platelet Count: 132 10*3/uL — ABNORMAL LOW (ref 150–400)
RBC: 2.45 MIL/uL — ABNORMAL LOW (ref 3.87–5.11)
RDW: 20.2 % — ABNORMAL HIGH (ref 11.5–15.5)
WBC Count: 2.2 10*3/uL — ABNORMAL LOW (ref 4.0–10.5)
nRBC: 0 % (ref 0.0–0.2)

## 2020-08-23 MED ORDER — DIPHENHYDRAMINE HCL 50 MG/ML IJ SOLN
50.0000 mg | Freq: Once | INTRAMUSCULAR | Status: AC
  Administered 2020-08-23: 50 mg via INTRAVENOUS

## 2020-08-23 MED ORDER — HEPARIN SOD (PORK) LOCK FLUSH 100 UNIT/ML IV SOLN
500.0000 [IU] | Freq: Once | INTRAVENOUS | Status: DC | PRN
  Filled 2020-08-23: qty 5

## 2020-08-23 MED ORDER — SODIUM CHLORIDE 0.9 % IV SOLN
200.0000 mg | Freq: Once | INTRAVENOUS | Status: AC
  Administered 2020-08-23: 200 mg via INTRAVENOUS
  Filled 2020-08-23: qty 8

## 2020-08-23 MED ORDER — SODIUM CHLORIDE 0.9% FLUSH
10.0000 mL | INTRAVENOUS | Status: DC | PRN
  Filled 2020-08-23: qty 10

## 2020-08-23 MED ORDER — SODIUM CHLORIDE 0.9 % IV SOLN
60.0000 mg/m2 | Freq: Once | INTRAVENOUS | Status: AC
  Administered 2020-08-23: 102 mg via INTRAVENOUS
  Filled 2020-08-23: qty 17

## 2020-08-23 MED ORDER — METHYLPREDNISOLONE SODIUM SUCC 125 MG IJ SOLR
INTRAMUSCULAR | Status: AC
  Filled 2020-08-23: qty 2

## 2020-08-23 MED ORDER — SODIUM CHLORIDE 0.9 % IV SOLN
Freq: Once | INTRAVENOUS | Status: AC
  Filled 2020-08-23: qty 250

## 2020-08-23 MED ORDER — METHYLPREDNISOLONE SODIUM SUCC 125 MG IJ SOLR
100.0000 mg | Freq: Once | INTRAMUSCULAR | Status: AC
  Administered 2020-08-23: 100 mg via INTRAVENOUS

## 2020-08-23 MED ORDER — SODIUM CHLORIDE 0.9% FLUSH
10.0000 mL | Freq: Once | INTRAVENOUS | Status: AC
  Administered 2020-08-23: 10 mL
  Filled 2020-08-23: qty 10

## 2020-08-23 MED ORDER — DIPHENHYDRAMINE HCL 50 MG/ML IJ SOLN
INTRAMUSCULAR | Status: AC
  Filled 2020-08-23: qty 1

## 2020-08-23 MED ORDER — FAMOTIDINE IN NACL 20-0.9 MG/50ML-% IV SOLN
INTRAVENOUS | Status: AC
  Filled 2020-08-23: qty 50

## 2020-08-23 MED ORDER — FAMOTIDINE IN NACL 20-0.9 MG/50ML-% IV SOLN
20.0000 mg | Freq: Once | INTRAVENOUS | Status: AC
  Administered 2020-08-23: 20 mg via INTRAVENOUS

## 2020-08-23 NOTE — Progress Notes (Signed)
Ok to treat today with current [place] per [employer] PA

## 2020-08-23 NOTE — Patient Instructions (Signed)
[facility] Discharge Instructions for Patients Receiving Chemotherapy  Today you received the following chemotherapy agents:  Paclitaxel & Beryle Flock   To help prevent nausea and vomiting after your treatment, we encourage you to take your nausea medication as prescribed. If you develop nausea and vomiting that is not controlled by your nausea medication, call the clinic.   BELOW ARE SYMPTOMS THAT SHOULD BE REPORTED IMMEDIATELY:  *FEVER GREATER THAN 100.5 F  *CHILLS WITH OR WITHOUT FEVER  NAUSEA AND VOMITING THAT IS NOT CONTROLLED WITH YOUR NAUSEA MEDICATION  *UNUSUAL SHORTNESS OF BREATH  *UNUSUAL BRUISING OR BLEEDING  TENDERNESS IN MOUTH AND THROAT WITH OR WITHOUT PRESENCE OF ULCERS  *URINARY PROBLEMS  *BOWEL PROBLEMS  UNUSUAL RASH Items with * indicate a potential emergency and should be followed up as soon as possible.  Feel free to call the clinic should you have any questions or concerns. The clinic phone number is [phone].  Please show the [place] at check-in to the Emergency Department and triage nurse.

## 2020-08-23 NOTE — Progress Notes (Signed)
Decrease Taxol to 60 mg/m2 & no Carboplatin today per Cassie Heilingoetter, PA.  Orders updated accordingly.  Kennith Center, Pharm.D., CPP 08/23/2020@11 :13 AM

## 2020-08-26 ENCOUNTER — Encounter: Payer: Self-pay | Admitting: *Deleted

## 2020-08-27 ENCOUNTER — Telehealth: Payer: Self-pay

## 2020-08-27 NOTE — Telephone Encounter (Signed)
Victoria Coffey called to let us know that her surgery is scheduled for 11/30 not 11/18.  She wanted to know if she should have a treatment, keytruda, before surgery.

## 2020-08-28 NOTE — Telephone Encounter (Signed)
I reviewed with Dr Burr Medico.  Pt to have [place] on 11/12 and 12/23 cancel keytruda on 12/3.  Scheduling message sent.  I spoke with [name] and updated her on the plan.  She verbalized understanding.

## 2020-08-30 ENCOUNTER — Telehealth: Payer: Self-pay | Admitting: Hematology

## 2020-08-30 NOTE — Telephone Encounter (Signed)
Scheduled appt per 10/27 sch msg - spoke with patient and she is aware of appt date and time

## 2020-09-03 ENCOUNTER — Encounter: Payer: Self-pay | Admitting: *Deleted

## 2020-09-11 NOTE — Progress Notes (Signed)
[facility]   Telephone:([phone] [phone]   Clinic Follow up Note   Patient Care Team: [name], MD as PCP - General (Infectious Diseases) [name], MD as PCP - Infectious Diseases (Infectious Diseases) [name], MD as Consulting Physician (General Surgery) [name], MD as Consulting Physician (Hematology) [name], MD as Consulting Physician (Radiation Oncology) [name], RN as Oncology Nurse Navigator [name], RN as Oncology Nurse Navigator  Date of Service:  09/13/2020  CHIEF COMPLAINT:  F/u of left breast cancer  SUMMARY OF ONCOLOGIC HISTORY: Oncology History Overview Note  Cancer Staging Malignant neoplasm of upper-outer quadrant of left breast in female, estrogen receptor positive ([place]) Staging form: Breast, AJCC 8th Edition - Clinical stage from 03/01/2020: Stage IV (cT4, cN0, pM1, G3, ER+, PR-, HER2-) - Signed by [name], MD on 03/06/2020    Malignant neoplasm of upper-outer quadrant of left breast in female, estrogen receptor positive ([place])  02/28/2020 Mammogram   Diagnostic Mammogram 02/28/20  IMPRESSION The 3.9 cm ill defined mass in the left breast posterior depth central 4.3cm to the nipple seen on the mediolateral oblique view onlt with associated difssue skin thickening is highly suspicious for malignancy.    03/01/2020 Cancer Staging   Staging form: Breast, AJCC 8th Edition - Clinical stage from 03/01/2020: Stage IIIC (cT4, cN0, cM0, G3, ER+, PR-, HER2-) - Signed by [name], MD on 08/22/2020   03/01/2020 Initial Biopsy   Diagnosis 03/01/20  1. Breast, left, needle core biopsy, 9 o'clock, 3-4cmfn - INVASIVE DUCTAL CARCINOMA. SEE NOTE 2. Lymph node, needle/core biopsy, right axilla - INVASIVE DUCTAL CARCINOMA. SEE NOTE Diagnosis Note 1. Invasive carcinoma measures 1.5 cm in greatest linear dimension and appears grade 3. Dr. [name] reviewed the case and concurs with the diagnosis. A breast  prognostic profile (ER, PR, Ki-67 and HER2) is pending and will be reported in an addendum. Dr. [name] was notified on 03/04/2020. 2. Carcinoma measures 0.6 cm in greatest linear dimension and appears grade 3. Lymphoid tissue is not identified - this may represent a completely replaced lymph node. A breast prognostic profile (ER, PR, Ki-67 and HER2) is pending and will be reported in an addendum. Dr. [name] reviewed the case and concurs with the diagnosis. Dr. [name] was notified on 03/04/2020.   03/01/2020 Receptors her2   1. PROGNOSTIC INDICATORS Results: IMMUNOHISTOCHEMICAL AND MORPHOMETRIC ANALYSIS PERFORMED MANUALLY The tumor cells are NEGATIVE for Her2 (0). Estrogen Receptor: 20%, POSITIVE, WEAK STAINING INTENSITY Progesterone Receptor: 0%, NEGATIVE Proliferation Marker Ki67: 40% COMMENT: The negative hormone receptor study(ies) in this case has an internal positive control.    2. PROGNOSTIC INDICATORS Results: IMMUNOHISTOCHEMICAL AND MORPHOMETRIC ANALYSIS PERFORMED MANUALLY The tumor cells are NEGATIVE for Her2 (1+). Estrogen Receptor: 10%, POSITIVE, WEAK STAINING INTENSITY Progesterone Receptor: 0%, NEGATIVE Proliferation Marker Ki67: 40% COMMENT: The negative hormone receptor study(ies) in this case has no internal positive control.   03/05/2020 Initial Diagnosis   Malignant neoplasm of upper-outer quadrant of left breast in female, estrogen receptor positive ([place])   03/18/2020 Breast MRI   IMPRESSION: 1. 7 x 9 x 6 cm area of suspicious non masslike enhancement within the central/UPPER RIGHT breast with anterior skin thickening. Given biopsy-proven metastatic RIGHT axillary lymph node, tissue sampling of the anterior and posterior aspects of this non masslike RIGHT breast enhancement is recommended to exclude malignancy. 2. 7 x 6 x 5 cm diffuse masslike and nonmasslike enhancement within the majority of the remaining LEFT breast with diffuse LEFT  breast skin thickening,  compatible with malignancy. Abnormal enhancement is noted along the anterior aspect of the LEFT pectoralis muscle. 3. Three abnormal RIGHT axillary lymph nodes, 1 which is biopsy proven to be metastatic disease. No abnormal LEFT axillary lymph nodes identified.   03/20/2020 Echocardiogram   Baseline ECHO  IMPRESSIONS     1. The patient was reported to be in pain during the study and not able  to participate in this study. All that can be said is that the LV and RV  function are grossly normal. Would recommend to repeat this study when/if  the patient is able to tolerate  the exam.   2. Left ventricular ejection fraction, by estimation, is 60 to 65%. The  left ventricle has normal function. Left ventricular endocardial border  not optimally defined to evaluate regional wall motion. Left ventricular  diastolic function could not be  evaluated.   3. Right ventricular systolic function is normal. The right ventricular  size is normal.   4. The mitral valve was not well visualized. No evidence of mitral valve  regurgitation.   5. The aortic valve was not well visualized. Aortic valve regurgitation  is not visualized.   6. The inferior vena cava is normal in size with greater than 50%  respiratory variability, suggesting right atrial pressure of 3 mmHg.    03/20/2020 Imaging   CT CAP W contrast  IMPRESSION: 1. Small right axillary/subpectoral lymph nodes. Difficult to exclude metastatic disease. 2. Subtle 6 mm low-attenuation lesion in the dome of the liver, not well seen previously. Lesion is too small to characterize. Further evaluation could be performed with MR abdomen without and with contrast, as clinically indicated. 3. Hepatic steatosis. 4. Aortic atherosclerosis (ICD10-I70.0). Coronary artery calcification.     03/20/2020 Imaging   Whole body bone scan  IMPRESSION: 1. Increased activity noted over the right breast, possibly related to the patient's right breast  cancer.   2. Punctate area of increased activity over the left maxillary region, most likely related to sinus disease or dental disease. Increased activity noted over the left mandible also possibly related to dental disease. No other focal bony abnormalities to suggest metastatic disease identified.   03/22/2020 Procedure   PAC placement by Dr [name]    03/22/2020 - 08/23/2020 Chemotherapy   AC q2weeks for 4 cycles 03/22/20-05/03/20 followed by weekly Taxol and Carboplatin q3weeks for 12 weeks starting 05/17/20.                  Week 2 Taxol reduced to half dose and CT was dose reduced starting with week 4 due to thrombocytopenia. Given pancytopenia, will change Carboplatin to 50% dose weekly with Taxol 20m/m2 starting with week 7 (06/13/20). Will hold Carboplatin as needed based on labs. Carboplatin increased to 80% starting with week 8. Carboplatin dose reduced for C11 and held with C12. C12 Taxol postponed to 08/23/20   03/22/2020 Pathology Results   FINAL MICROSCOPIC DIAGNOSIS:   A. BREAST, RIGHT, PUNCH BIOPSY:  - Carcinoma involving dermal lymphatics.  - See comment.   B. BREAST, LEFT, PUNCH BIOPSY:  - Carcinoma involving dermal lymphatics.  - See comment.   COMMENT:  The morphology is compatible with ductal breast carcinoma.    ADDENDUM:   PROGNOSTIC INDICATOR RESULTS:   Immunohistochemical and morphometric analysis performed manually   The tumor cells are NEGATIVE for Her2 (0%).   Estrogen Receptor:       NEGATIVE, 0%  Progesterone Receptor:   NEGATIVE, 0%  08/13/2020 Breast MRI   IMPRESSION: 1. Interval significant response to treatment of the diffuse non-mass enhancement involving the UPPER RIGHT breast. There is a solitary residual 5 mm indeterminate mass in the UPPER OUTER QUADRANT at MIDDLE depth with plateau kinetics. 2. Minimal response to treatment of the diffuse non-mass enhancement throughout the LEFT breast. 3. Resolution of enhancement of the skin and  nipple-areolar complex of the RIGHT breast. 4. Near complete resolution of enhancement of the skin and nipple-areolar complex of the LEFT breast. Minimal residual nipple-areola complex enhancement persists. 5. The 3 previously identified pathologic RIGHT axillary lymph nodes are now normal in appearance. There is no pathologic lymphadenopathy currently.   08/23/2020 -  Chemotherapy   Immunotherapy Keytruda q3weeks starting 08/23/20 for 1 year treatment.       CURRENT THERAPY:  Keytruda q3weeks starting 08/23/20 for 1 year treatment.  PENDING Surgery 10/01/20  INTERVAL HISTORY:  Victoria Coffey is here for a follow up. She presents to the clinic alone. She is scheduled for bilateral mastectomy by Dr. [name] October 01, 2020.  She is looking forward to it.  She has recovered well from chemotherapy, appetite and energy level improved.  No nausea, pain, or other discomfort.  She tolerated Keytruda well. No noticeable side effect.   All other systems were reviewed with the patient and are negative.  MEDICAL HISTORY:  Past Medical History:  Diagnosis Date  . Breast cancer ([place]) dx'd 1993   left  . History of left breast cancer   . HIV infection ([place])   . Hypertension     SURGICAL HISTORY: Past Surgical History:  Procedure Laterality Date  . BREAST BIOPSY Bilateral 03/22/2020   Procedure: BILATERAL BREAST PUNCH BIOPSIES;  Surgeon: [name], MD;  Location: [facility];  Service: General;  Laterality: Bilateral;  . BREAST LUMPECTOMY    . CESAREAN SECTION    . left breast lumpectomy  1993  . PORTACATH PLACEMENT Right 03/22/2020   Procedure: INSERTION PORT-A-CATH WITH ULTRASOUND GUIDANCE;  Surgeon: [name], MD;  Location: [facility];  Service: General;  Laterality: Right;    I have reviewed the social history and family history with the patient and they are unchanged from previous note.  ALLERGIES:  is allergic to codeine, other, grapefruit bioflavonoid complex, pomegranate  [punica], and shellfish allergy.  MEDICATIONS:  Current Outpatient Medications  Medication Sig Dispense Refill  . acetaminophen (TYLENOL) 500 MG tablet Take 1,000 mg by mouth every 6 (six) hours as needed for moderate pain.     Marland Kitchen emtricitabine-rilpivir-tenofovir AF (ODEFSEY) 200-25-25 MG TABS tablet Take 1 tablet by mouth daily with breakfast. 30 tablet 11  . hydrochlorothiazide (HYDRODIURIL) 25 MG tablet Take 1 tablet (25 mg total) by mouth daily. 30 tablet 11  . loratadine (CLARITIN) 10 MG tablet Take 10 mg by mouth daily. Takes 3-4 days before and after treatment    . oxyCODONE (OXY IR/ROXICODONE) 5 MG immediate release tablet Take 1 tablet (5 mg total) by mouth every 6 (six) hours as needed for severe pain. 15 tablet 0  . potassium chloride SA (KLOR-CON) 20 MEQ tablet Take 1 tablet (20 mEq total) by mouth 2 (two) times daily. Take 1 tab twice daily for 3 days then 1 tab once daily 60 tablet 1   No current facility-administered medications for this visit.    PHYSICAL EXAMINATION: ECOG PERFORMANCE STATUS: 1 - Symptomatic but completely ambulatory  Vitals:   09/13/20 0756  BP: (!) 143/82  Pulse: 88  Resp: 20  Temp:  97.8 F (36.6 C)  SpO2: 100%   Filed Weights   09/13/20 0756  Weight: 138 lb 1.6 oz (62.6 kg)    Due to COVID19 we will limit examination to appearance. Patient had no complaints.  GENERAL:alert, no distress and comfortable SKIN: skin color normal, no rashes or significant lesions EYES: normal, Conjunctiva are pink and non-injected, sclera clear  NEURO: alert & oriented x 3 with fluent speech   LABORATORY DATA:  I have reviewed the data as listed CBC Latest Ref Rng & Units 09/13/2020 08/23/2020 08/15/2020  WBC 4.0 - 10.5 K/uL 3.8(L) 2.2(L) 1.6(L)  Hemoglobin 12.0 - 15.0 g/dL 9.3(L) 8.3(L) 8.2(L)  Hematocrit 36 - 46 % 28.4(L) 25.4(L) 24.7(L)  Platelets 150 - 400 K/uL 282 132(L) 67(L)     CMP Latest Ref Rng & Units 09/13/2020 08/23/2020 08/15/2020  Glucose  70 - 99 mg/dL 99 93 94  BUN 8 - 23 mg/dL '14 14 13  ' Creatinine 0.44 - 1.00 mg/dL 0.86 0.83 0.82  Sodium 135 - 145 mmol/L 137 138 137  Potassium 3.5 - 5.1 mmol/L 3.8 4.3 4.1  Chloride 98 - 111 mmol/L 108 111 109  CO2 22 - 32 mmol/L 21(L) 22 26  Calcium 8.9 - 10.3 mg/dL 8.9 9.2 9.1  Total Protein 6.5 - 8.1 g/dL 7.3 7.1 7.0  Total Bilirubin 0.3 - 1.2 mg/dL 0.2(L) <0.2(L) 0.3  Alkaline Phos 38 - 126 U/L 205(H) 175(H) 150(H)  AST 15 - 41 U/L '28 23 23  ' ALT 0 - 44 U/L '22 17 18      ' RADIOGRAPHIC STUDIES: I have personally reviewed the radiological images as listed and agreed with the findings in the report. No results found.   ASSESSMENT & PLAN:  Victoria Coffey is a 64 y.o. female with    1.Left breast cancer,cT4N0Mx, inflammatorybreast cancer, triple negative,andright breat cancer in UOQ withaxillary node metastasis, ERweakly+,PR/HER2-, Azucena Kuba -She was diagnosed with left breast cancer in 02/2020.she presented with inflammatory left breast cancer. 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 -Further work up showed 9cm nodular area in right breast and skin biopsy confirmed cancer involvement -Staging scans were negative for distant metastasis -She completed AC X4 and weekly TC (Carboplatin intermittent held for cytopenias) 03/22/20-08/23/20.  -Given recent publish Keynote 522 trial data, I started her on Immunotherapy Keytruda q3weeks for 1 year of treatment beginning 08/23/20.  -She will proceed with B/l mastectomy on 10/01/20.  -We discussed the pros and cons of reconstruction after mastectomy, after a lengthy discussion, she decided not to proceed with reconstruction. -Labs overall adequate to proceed with cycle 2 Keytruda today. Continue every 3 weeks, but will postpone cycle 3 for week due to her surgery. -Due to positive lymph nodes, she would benefit from adjuvant radiation -We briefly discussed the role of adjuvant chemo  Xeloda, will decide after her surgery -F/u with cycle 3 treatment    2. 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 H/o Left breast cancer, Dx in 1993s/p lumpectomy, radiation, chemo, Tamoxifen.Genetic Testinghas not been done yet.  4. Comorbidities: HIV(+)Dx 1993, HTN -Her HIV has been undetectable and well controlled. She is currently being seen by ID Dr Graylon Good   PLAN: -Lab reviewed, overall adequate to proceed with cycle 2 Keytruda today  -She is scheduled for bilateral mastectomy October 01, 2020, without reconstruction -Lab, flush, f/u, Keytruda in in 4 weeks   No problem-specific Assessment & Plan notes found for this encounter.  No orders of the defined types were placed in this encounter.  All questions were answered. The patient knows to call the clinic with any problems, questions or concerns. No barriers to learning was detected. The total time spent in the appointment was 30 minutes.     [name], MD 09/13/2020   I, Joslyn Devon, am acting as scribe for [name], MD.   I have reviewed the above documentation for accuracy and completeness, and I agree with the above.

## 2020-09-13 ENCOUNTER — Encounter: Payer: Self-pay | Admitting: Hematology

## 2020-09-13 ENCOUNTER — Other Ambulatory Visit: Payer: Self-pay

## 2020-09-13 ENCOUNTER — Inpatient Hospital Stay (HOSPITAL_BASED_OUTPATIENT_CLINIC_OR_DEPARTMENT_OTHER): Payer: BC Managed Care – PPO | Admitting: Hematology

## 2020-09-13 ENCOUNTER — Inpatient Hospital Stay: Payer: BC Managed Care – PPO

## 2020-09-13 ENCOUNTER — Inpatient Hospital Stay: Payer: BC Managed Care – PPO | Attending: Hematology

## 2020-09-13 VITALS — BP 143/82 | HR 88 | Temp 97.8°F | Resp 20 | Ht 61.0 in | Wt 138.1 lb

## 2020-09-13 DIAGNOSIS — Z5112 Encounter for antineoplastic immunotherapy: Secondary | ICD-10-CM | POA: Insufficient documentation

## 2020-09-13 DIAGNOSIS — Z853 Personal history of malignant neoplasm of breast: Secondary | ICD-10-CM | POA: Insufficient documentation

## 2020-09-13 DIAGNOSIS — Z171 Estrogen receptor negative status [ER-]: Secondary | ICD-10-CM | POA: Diagnosis not present

## 2020-09-13 DIAGNOSIS — Z170 Estrogen receptor positive status [ER+]: Secondary | ICD-10-CM

## 2020-09-13 DIAGNOSIS — D61810 Antineoplastic chemotherapy induced pancytopenia: Secondary | ICD-10-CM | POA: Diagnosis not present

## 2020-09-13 DIAGNOSIS — I10 Essential (primary) hypertension: Secondary | ICD-10-CM | POA: Insufficient documentation

## 2020-09-13 DIAGNOSIS — B20 Human immunodeficiency virus [HIV] disease: Secondary | ICD-10-CM | POA: Diagnosis not present

## 2020-09-13 DIAGNOSIS — C50412 Malignant neoplasm of upper-outer quadrant of left female breast: Secondary | ICD-10-CM

## 2020-09-13 DIAGNOSIS — Z23 Encounter for immunization: Secondary | ICD-10-CM | POA: Insufficient documentation

## 2020-09-13 DIAGNOSIS — Z95828 Presence of other vascular implants and grafts: Secondary | ICD-10-CM

## 2020-09-13 DIAGNOSIS — Z21 Asymptomatic human immunodeficiency virus [HIV] infection status: Secondary | ICD-10-CM | POA: Diagnosis not present

## 2020-09-13 DIAGNOSIS — Z923 Personal history of irradiation: Secondary | ICD-10-CM | POA: Insufficient documentation

## 2020-09-13 LAB — CMP (CANCER CENTER ONLY)
ALT: 22 U/L (ref 0–44)
AST: 28 U/L (ref 15–41)
Albumin: 3.4 g/dL — ABNORMAL LOW (ref 3.5–5.0)
Alkaline Phosphatase: 205 U/L — ABNORMAL HIGH (ref 38–126)
Anion gap: 8 (ref 5–15)
BUN: 14 mg/dL (ref 8–23)
CO2: 21 mmol/L — ABNORMAL LOW (ref 22–32)
Calcium: 8.9 mg/dL (ref 8.9–10.3)
Chloride: 108 mmol/L (ref 98–111)
Creatinine: 0.86 mg/dL (ref 0.44–1.00)
GFR, Estimated: >60 mL/min (ref >60)
Glucose, Bld: 99 mg/dL (ref 70–99)
Potassium: 3.8 mmol/L (ref 3.5–5.1)
Sodium: 137 mmol/L (ref 135–145)
Total Bilirubin: 0.2 mg/dL — ABNORMAL LOW (ref 0.3–1.2)
Total Protein: 7.3 g/dL (ref 6.5–8.1)

## 2020-09-13 LAB — CBC WITH DIFFERENTIAL (CANCER CENTER ONLY)
Abs Immature Granulocytes: 0.01 10*3/uL (ref 0.00–0.07)
Basophils Absolute: 0 10*3/uL (ref 0.0–0.1)
Basophils Relative: 0 %
Eosinophils Absolute: 0 10*3/uL (ref 0.0–0.5)
Eosinophils Relative: 1 %
HCT: 28.4 % — ABNORMAL LOW (ref 36.0–46.0)
Hemoglobin: 9.3 g/dL — ABNORMAL LOW (ref 12.0–15.0)
Immature Granulocytes: 0 %
Lymphocytes Relative: 29 %
Lymphs Abs: 1.1 10*3/uL (ref 0.7–4.0)
MCH: 33.3 pg (ref 26.0–34.0)
MCHC: 32.7 g/dL (ref 30.0–36.0)
MCV: 101.8 fL — ABNORMAL HIGH (ref 80.0–100.0)
Monocytes Absolute: 0.4 10*3/uL (ref 0.1–1.0)
Monocytes Relative: 11 %
Neutro Abs: 2.2 10*3/uL (ref 1.7–7.7)
Neutrophils Relative %: 59 %
Platelet Count: 282 10*3/uL (ref 150–400)
RBC: 2.79 MIL/uL — ABNORMAL LOW (ref 3.87–5.11)
RDW: 17.1 % — ABNORMAL HIGH (ref 11.5–15.5)
WBC Count: 3.8 10*3/uL — ABNORMAL LOW (ref 4.0–10.5)
nRBC: 0 % (ref 0.0–0.2)

## 2020-09-13 MED ORDER — INFLUENZA VAC SPLIT QUAD 0.5 ML IM SUSY
0.5000 mL | PREFILLED_SYRINGE | Freq: Once | INTRAMUSCULAR | Status: AC
  Administered 2020-09-13: 0.5 mL via INTRAMUSCULAR

## 2020-09-13 MED ORDER — SODIUM CHLORIDE 0.9 % IV SOLN
Freq: Once | INTRAVENOUS | Status: AC
  Filled 2020-09-13: qty 250

## 2020-09-13 MED ORDER — INFLUENZA VAC SPLIT QUAD 0.5 ML IM SUSY
PREFILLED_SYRINGE | INTRAMUSCULAR | Status: AC
  Filled 2020-09-13: qty 0.5

## 2020-09-13 MED ORDER — SODIUM CHLORIDE 0.9% FLUSH
10.0000 mL | Freq: Once | INTRAVENOUS | Status: AC
  Administered 2020-09-13: 10 mL
  Filled 2020-09-13: qty 10

## 2020-09-13 MED ORDER — HEPARIN SOD (PORK) LOCK FLUSH 100 UNIT/ML IV SOLN
500.0000 [IU] | Freq: Once | INTRAVENOUS | Status: AC | PRN
  Administered 2020-09-13: 500 [IU]
  Filled 2020-09-13: qty 5

## 2020-09-13 MED ORDER — SODIUM CHLORIDE 0.9 % IV SOLN
200.0000 mg | Freq: Once | INTRAVENOUS | Status: AC
  Administered 2020-09-13: 200 mg via INTRAVENOUS
  Filled 2020-09-13: qty 8

## 2020-09-13 MED ORDER — SODIUM CHLORIDE 0.9% FLUSH
10.0000 mL | INTRAVENOUS | Status: DC | PRN
  Administered 2020-09-13: 10 mL
  Filled 2020-09-13: qty 10

## 2020-09-13 NOTE — Patient Instructions (Signed)
[facility] Discharge Instructions for Patients Receiving Chemotherapy  Today you received the following chemotherapy agents :  Keytruda.  To help prevent nausea and vomiting after your treatment, we encourage you to take your nausea medication as prescribed.   If you develop nausea and vomiting that is not controlled by your nausea medication, call the clinic.   BELOW ARE SYMPTOMS THAT SHOULD BE REPORTED IMMEDIATELY:  *FEVER GREATER THAN 100.5 F  *CHILLS WITH OR WITHOUT FEVER  NAUSEA AND VOMITING THAT IS NOT CONTROLLED WITH YOUR NAUSEA MEDICATION  *UNUSUAL SHORTNESS OF BREATH  *UNUSUAL BRUISING OR BLEEDING  TENDERNESS IN MOUTH AND THROAT WITH OR WITHOUT PRESENCE OF ULCERS  *URINARY PROBLEMS  *BOWEL PROBLEMS  UNUSUAL RASH Items with * indicate a potential emergency and should be followed up as soon as possible.  Feel free to call the clinic should you have any questions or concerns. The clinic phone number is [phone].  Please show the CHEMO ALERT CARD at check-in to the Emergency Department and triage nurse.  

## 2020-09-14 LAB — CANCER ANTIGEN 27.29: CA 27.29: 70.1 U/mL — ABNORMAL HIGH (ref 0.0–38.6)

## 2020-09-16 ENCOUNTER — Telehealth: Payer: Self-pay | Admitting: Hematology

## 2020-09-16 NOTE — Telephone Encounter (Signed)
Scheduled per 11/12 los. Pt is aware of appt times and dates.

## 2020-09-23 NOTE — Progress Notes (Addendum)
[id number] [address] [place] [address] Phone: [phone] Fax: [phone]      Your procedure is scheduled on 10/01/2020.  Report to [facility] Main Entrance "A" at 07:00 A.M., and check in at the Admitting office.  Call this number if you have problems the morning of surgery:  [phone]  Call [phone] if you have any questions prior to your surgery date Monday-Friday 8am-4pm    Remember:  Do not eat after midnight the night before your surgery  You may drink clear liquids until 06:00am the morning of your surgery.   Clear liquids allowed are: Water, Non-Citrus Juices (without pulp), Carbonated Beverages, Clear Tea, Black Coffee Only, and Gatorade      Take these medicines the morning of surgery with A SIP OF WATER  emtricitabine-rilpivir-tenofovir AF (ODEFSEY) Acetaminophen (Tylenol) if needed.   As of today, STOP taking any Aspirin (unless otherwise instructed by your surgeon) Aleve, Naproxen, Ibuprofen, Motrin, Advil, Goody's, BC's, all herbal medications, fish oil, and all vitamins.                      Do not wear jewelry, make up, or nail polish            Do not wear lotions, powders, perfumes/colognes, or deodorant.            Do not shave 48 hours prior to surgery.  Men may shave face and neck.            Do not bring valuables to the hospital.            [facility] is not responsible for any belongings or valuables.  Do NOT Smoke (Tobacco/Vaping) or drink Alcohol 24 hours prior to your procedure If you use a CPAP at night, you may bring all equipment for your overnight stay.   Contacts, glasses, dentures or bridgework may not be worn into surgery.      For patients admitted to the hospital, discharge time will be determined by your treatment team.   Patients discharged the day of surgery will not be allowed to drive home, and someone needs to  stay with them for 24 hours.    Special instructions:   [facility]- Preparing For Surgery  Before surgery, you can play an important role. Because skin is not sterile, your skin needs to be as free of germs as possible. You can reduce the number of germs on your skin by washing with CHG (chlorahexidine gluconate) Soap before surgery.  CHG is an antiseptic cleaner which kills germs and bonds with the skin to continue killing germs even after washing.    Oral Hygiene is also important to reduce your risk of infection.  Remember - BRUSH YOUR TEETH THE MORNING OF SURGERY WITH YOUR REGULAR TOOTHPASTE  Please do not use if you have an allergy to CHG or antibacterial soaps. If your skin becomes reddened/irritated stop using the CHG.  Do not shave (including legs and underarms) for at least 48 hours prior to first CHG shower. It is OK to shave your face.  Please follow these instructions carefully.   1. Shower the NIGHT BEFORE SURGERY and the MORNING OF SURGERY with CHG Soap.   2. If you chose to wash your hair, wash your hair first as usual with your normal shampoo.  3. After you shampoo, rinse your hair and  body thoroughly to remove the shampoo.  4. Use CHG as you would any other liquid soap. You can apply CHG directly to the skin and wash gently with a scrungie or a clean washcloth.   5. Apply the CHG Soap to your body ONLY FROM THE NECK DOWN.  Do not use on open wounds or open sores. Avoid contact with your eyes, ears, mouth and genitals (private parts). Wash Face and genitals (private parts)  with your normal soap.   6. Wash thoroughly, paying special attention to the area where your surgery will be performed.  7. Thoroughly rinse your body with warm water from the neck down.  8. DO NOT shower/wash with your normal soap after using and rinsing off the CHG Soap.  9. Pat yourself dry with a CLEAN TOWEL.  10. Wear CLEAN PAJAMAS to bed the night before surgery  11. Place CLEAN SHEETS on  your bed the night of your first shower and DO NOT SLEEP WITH PETS.   Day of Surgery: Wear Clean/Comfortable clothing the morning of surgery Do not apply any deodorants/lotions.   Remember to brush your teeth WITH YOUR REGULAR TOOTHPASTE.   Please read over the following fact sheets that you were given.

## 2020-09-24 ENCOUNTER — Encounter (HOSPITAL_COMMUNITY): Payer: Self-pay

## 2020-09-24 ENCOUNTER — Other Ambulatory Visit: Payer: Self-pay

## 2020-09-24 ENCOUNTER — Encounter (HOSPITAL_COMMUNITY)
Admission: RE | Admit: 2020-09-24 | Discharge: 2020-09-24 | Disposition: A | Discharge status: Home / Self Care | Payer: BC Managed Care – PPO | Source: Ambulatory Visit | Attending: General Surgery | Admitting: General Surgery

## 2020-09-24 DIAGNOSIS — B20 Human immunodeficiency virus [HIV] disease: Secondary | ICD-10-CM | POA: Insufficient documentation

## 2020-09-24 DIAGNOSIS — C50911 Malignant neoplasm of unspecified site of right female breast: Secondary | ICD-10-CM | POA: Diagnosis not present

## 2020-09-24 DIAGNOSIS — C50912 Malignant neoplasm of unspecified site of left female breast: Secondary | ICD-10-CM | POA: Diagnosis not present

## 2020-09-24 DIAGNOSIS — Z79899 Other long term (current) drug therapy: Secondary | ICD-10-CM | POA: Diagnosis not present

## 2020-09-24 DIAGNOSIS — Z95828 Presence of other vascular implants and grafts: Secondary | ICD-10-CM | POA: Insufficient documentation

## 2020-09-24 DIAGNOSIS — Z01812 Encounter for preprocedural laboratory examination: Secondary | ICD-10-CM | POA: Insufficient documentation

## 2020-09-24 DIAGNOSIS — I10 Essential (primary) hypertension: Secondary | ICD-10-CM | POA: Insufficient documentation

## 2020-09-24 HISTORY — DX: Presence of other vascular implants and grafts: Z95.828

## 2020-09-24 LAB — BASIC METABOLIC PANEL
Anion gap: 11 (ref 5–15)
BUN: 7 mg/dL — ABNORMAL LOW (ref 8–23)
CO2: 19 mmol/L — ABNORMAL LOW (ref 22–32)
Calcium: 9 mg/dL (ref 8.9–10.3)
Chloride: 107 mmol/L (ref 98–111)
Creatinine, Ser: 0.87 mg/dL (ref 0.44–1.00)
GFR, Estimated: >60 mL/min (ref >60)
Glucose, Bld: 99 mg/dL (ref 70–99)
Potassium: 4.6 mmol/L (ref 3.5–5.1)
Sodium: 137 mmol/L (ref 135–145)

## 2020-09-24 LAB — CBC
HCT: 33.6 % — ABNORMAL LOW (ref 36.0–46.0)
Hemoglobin: 10.8 g/dL — ABNORMAL LOW (ref 12.0–15.0)
MCH: 34.2 pg — ABNORMAL HIGH (ref 26.0–34.0)
MCHC: 32.1 g/dL (ref 30.0–36.0)
MCV: 106.3 fL — ABNORMAL HIGH (ref 80.0–100.0)
Platelets: 273 10*3/uL (ref 150–400)
RBC: 3.16 MIL/uL — ABNORMAL LOW (ref 3.87–5.11)
RDW: 15.9 % — ABNORMAL HIGH (ref 11.5–15.5)
WBC: 4 10*3/uL (ref 4.0–10.5)
nRBC: 0 % (ref 0.0–0.2)

## 2020-09-24 MED ORDER — CHLORHEXIDINE GLUCONATE CLOTH 2 % EX PADS: 6.0000 | MEDICATED_PAD | Freq: Once | CUTANEOUS | Status: DC

## 2020-09-24 NOTE — Progress Notes (Signed)
Anesthesia Chart Review:  Case: [id number] Date/Time: 10/01/20 0845   Procedures:      BILATERAL MASTECTOMIES WITH RIGHT SENTINEL LYMPH NODE BIOPSY, LEFT IS MODIFIED RADICAL (Bilateral Breast) - PEC BLOCK, RNFA     RADIOACTIVE SEED GUIDED  TARGETED RIGHT SENTINEL LYMPH NODE BIOPSY (Right Breast) - PEC BLOCK, RNFA   Anesthesia type: General   Pre-op diagnosis: BILATERAL BREAST CANCER   Location: [place] OR ROOM 02 / [place] OR   Surgeons: [name], MD    Special Needs:  Supine RNFA Pectoral block Neoprobe [place] 09/30/20 @ 2:30 PM Nuc Med 11/30 @ 8:30 AM ERAS  DISCUSSION: Patient is a 64 year old female scheduled for the above procedure.  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. Dr. Burr Medico to postpone cycle 3 of Keytruda due to surgery plans.    Presurgical COVID-19 test is scheduled for 09/27/2020.  Anesthesia team to evaluate on the day of surgery.  VS: BP (!) 141/87   Pulse 91   Temp 36.8 C   Resp 18   Ht 5\' 1"  (1.549 m)   Wt 61.7 kg   SpO2 100%   BMI 25.70 kg/m    PROVIDERS: [name], MD is ID. Last visit 07/31/20. HIV disease "well controlled" on Odefsey.  [name], MD is HEM-ONC [name], MD is RAD-ONC   LABS: Labs reviewed: Acceptable for surgery.  Postmenopausal. (all labs ordered are listed, but only abnormal results are displayed)  Labs Reviewed  CBC - Abnormal; Notable for the following components:      Result Value   RBC 3.16 (*)    Hemoglobin 10.8 (*)    HCT 33.6 (*)    MCV 106.3 (*)    MCH 34.2 (*)    RDW 15.9 (*)    All other components within normal limits  BASIC METABOLIC PANEL - Abnormal; Notable for the following components:    CO2 19 (*)    BUN 7 (*)    All other components within normal limits     IMAGES: 1V PCXR 03/22/20: IMPRESSION: Port-A-Cath tip in superior vena cava. No pneumothorax. Lungs clear. Postoperative change on the left. Cardiac silhouette within normal Limits.   EKG: 03/19/20: NSR   CV: Echo 03/20/20: IMPRESSIONS  1. The patient was reported to be in pain during the study and not able  to participate in this study. All that can be said is that the LV and RV  function are grossly normal. Would recommend to repeat this study when/if  the patient is able to tolerate  the exam.  2. Left ventricular ejection fraction, by estimation, is 60 to 65%. The  left ventricle has normal function. Left ventricular endocardial border  not optimally defined to evaluate regional wall motion. Left ventricular  diastolic function could not be  evaluated.  3. Right ventricular systolic function is normal. The right ventricular  size is normal.  4. The mitral valve was not well visualized. No evidence of mitral valve  regurgitation.  5. The aortic valve was not well visualized. Aortic valve regurgitation  is not visualized.  6. The inferior vena cava is normal in size with greater than 50%  respiratory variability, suggesting right atrial pressure of 3 mmHg.     Past Medical  History:  Diagnosis Date  . Breast cancer ([place]) dx'd 1993   left  . History of left breast cancer   . HIV infection ([place])   . Hypertension   . Port-A-Cath in place     Past Surgical History:  Procedure Laterality Date  . BREAST BIOPSY Bilateral 03/22/2020   Procedure: BILATERAL BREAST PUNCH BIOPSIES;  Surgeon: [name], MD;  Location: [place];  Service: General;  Laterality: Bilateral;  . BREAST LUMPECTOMY    . CESAREAN SECTION    . left breast lumpectomy  1993  . PORTACATH PLACEMENT Right 03/22/2020   Procedure: INSERTION PORT-A-CATH WITH ULTRASOUND GUIDANCE;  Surgeon: [name], MD;  Location: [place];   Service: General;  Laterality: Right;    MEDICATIONS: . acetaminophen (TYLENOL) 500 MG tablet  . emtricitabine-rilpivir-tenofovir AF (ODEFSEY) 200-25-25 MG TABS tablet  . hydrochlorothiazide (HYDRODIURIL) 25 MG tablet  . lidocaine-prilocaine (EMLA) cream  . loratadine (CLARITIN) 10 MG tablet  . potassium chloride SA (KLOR-CON) 20 MEQ tablet   . Chlorhexidine Gluconate Cloth 2 % PADS 6 each   And  . Chlorhexidine Gluconate Cloth 2 % PADS 6 each    Myra Gianotti, PA-C Surgical Short Stay/Anesthesiology [facility] Phone [phone] [facility] Phone [phone] 09/24/2020 3:10 PM

## 2020-09-24 NOTE — Anesthesia Preprocedure Evaluation (Addendum)
Anesthesia Evaluation  Patient identified by MRN, date of birth, ID band Patient awake    Reviewed: Allergy & Precautions, NPO status , Patient's Chart, lab work & pertinent test results  History of Anesthesia Complications Negative for: history of anesthetic complications  Airway Mallampati: II  TM Distance: >3 FB Neck ROM: Full    Dental  (+) Teeth Intact, Dental Advisory Given   Pulmonary neg pulmonary ROS,  09/27/2020 SARS coronavirus NEG   breath sounds clear to auscultation       Cardiovascular hypertension, Pt. on medications (-) angina Rhythm:Regular Rate:Normal  03/2020 ECHO: EF 60-65%, normal LVF, no significant valvular abnormalities   Neuro/Psych negative neurological ROS     GI/Hepatic negative GI ROS, Neg liver ROS,   Endo/Other  negative endocrine ROS  Renal/GU negative Renal ROS     Musculoskeletal   Abdominal   Peds  Hematology  (+) Blood dyscrasia (Hb 10.8), anemia , HIV,   Anesthesia Other Findings Breast cancer  Reproductive/Obstetrics                           Anesthesia Physical Anesthesia Plan  ASA: III  Anesthesia Plan: General   Post-op Pain Management: GA combined w/ Regional for post-op pain   Induction: Intravenous  PONV Risk Score and Plan: 3 and Ondansetron, Dexamethasone, Treatment may vary due to age or medical condition and Scopolamine patch - Pre-op  Airway Management Planned: Oral ETT  Additional Equipment: None  Intra-op Plan:   Post-operative Plan: Extubation in OR  Informed Consent: I have reviewed the patients History and Physical, chart, labs and discussed the procedure including the risks, benefits and alternatives for the proposed anesthesia with the patient or authorized representative who has indicated his/her understanding and acceptance.     Dental advisory given  Plan Discussed with: CRNA and Surgeon  Anesthesia Plan  Comments: (PAT note written by Myra Gianotti, PA-C. Plan routine monitors, GETA with bilat pectoralis block for post op analgesia )      Anesthesia Quick Evaluation

## 2020-09-24 NOTE — Progress Notes (Signed)
PCP - C. SYNDER     MED CLEARANCE Cardiologist - NA    Chest x-ray - 5/21 EKG - 5/21 Stress Test - NA ECHO - 5/21 Cardiac Cath - NA     Aspirin Instructions:STOP  ERAS Protcol -INSTRUCTED PRE-SURGERY Ensure or G2- NONE  COVID TEST- FOR NOV 26   Anesthesia review: HX HTN , ACTIVE CHEMO  Patient denies shortness of breath, fever, cough and chest pain at PAT appointment   All instructions explained to the patient, with a verbal understanding of the material. Patient agrees to go over the instructions while at home for a better understanding. Patient also instructed to self quarantine after being tested for COVID-19. The opportunity to ask questions was provided.

## 2020-09-27 ENCOUNTER — Other Ambulatory Visit (HOSPITAL_COMMUNITY)
Admission: RE | Admit: 2020-09-27 | Discharge: 2020-09-27 | Disposition: A | Discharge status: Home / Self Care | Payer: BC Managed Care – PPO | Source: Ambulatory Visit | Attending: General Surgery | Admitting: General Surgery

## 2020-09-27 DIAGNOSIS — Z20822 Contact with and (suspected) exposure to covid-19: Secondary | ICD-10-CM | POA: Diagnosis not present

## 2020-09-27 DIAGNOSIS — Z01812 Encounter for preprocedural laboratory examination: Secondary | ICD-10-CM | POA: Diagnosis not present

## 2020-09-27 LAB — SARS CORONAVIRUS 2 (TAT 6-24 HRS): SARS Coronavirus 2: NEGATIVE

## 2020-09-30 NOTE — H&P (View-Only) (Signed)
[place] Location: [facility] Surgery Patient #: [id number] DOB: May 09, 1956 Married / Language: English / Race: Black or African American Female   History of Present Illness The patient is a 64 year old female who presents for a follow-up for Breast cancer. IMAGING SOLIS  Pt is a 64 yo F who was referred by [name] for consultation with a diagnosis of bilateral breast cancer 02/2020. The patient had a history of left breast cancer in 1992. At that point, she had lumpectomy, ALND, chemo, and radiation. She presented with skin changes of redness and swelling and a firm breast on the left. Upon diagnostic imaging, the left breast had at least a 3.9 cm mass with skin thickening of around 6 mm. The mass was indistinct and difficult to image. Surprisingly, the right side had adenopathy seen, but no breast masses seen. It originally looked like a single node on mammo, but at ultrasound looked like three clumped nodes. The right side also had skin changes, but less than the left. She had core needle biopsy on both sides. 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 The right axillary LN biopsy was similar with grade 3 invasive ductal carcinoma that was weakly ER + /PR-, her 2-. She has no family history of cancer. She is a G2P2 with first child at age 11. Menarche at age 76. She took hormonal contraception for 13 years, but stopped with her diagnosis of cancer. She had a colonoscopy in 1993. She has never had a bone density study, and last pap was 2017. Of note, the patient has HIV, but it is well controlled.   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 She has had neoadjuvant chemotherapy. She desires bilateral mastectomies. She has tolerated chemo ok. She hasn't had to be admitted. She did have to have dose reduction. Follow up MRI  showed resolution of right axillary adenopathy and left periareolar mass, but minimal response of mass in UOQ of left breast. She thinks that the left breast is softer.    Follow up MR post neoadjuvant chemo 08/13/2020  IMPRESSION: 1. Interval significant response to treatment of the diffuse non-mass enhancement involving the UPPER RIGHT breast. There is a solitary residual 5 mm indeterminate mass in the UPPER OUTER QUADRANT at MIDDLE depth with plateau kinetics. 2. Minimal response to treatment of the diffuse non-mass enhancement throughout the LEFT breast. 3. Resolution of enhancement of the skin and nipple-areolar complex of the RIGHT breast. 4. Near complete resolution of enhancement of the skin and nipple-areolar complex of the LEFT breast. Minimal residual nipple-areola complex enhancement persists. 5. The 3 previously identified pathologic RIGHT axillary lymph nodes are now normal in appearance. There is no pathologic lymphadenopathy currently.  RECOMMENDATION: 1. Treatment plan. 2. If a RIGHT breast sparing procedure is contemplated, then MRI guided biopsy of the solitary mass in the [place] at MIDDLE depth is recommended. 3. If a LEFT breast sparing procedure is contemplated, then MRI guided biopsy of the SUPERIOR and INFERIOR extent of the residual non-mass enhancement is recommended.  BI-RADS CATEGORY 6: Known biopsy-proven malignancy.     imaging is at [place].  pathology is described above.   Allergies  Shellfish  Rash. Grapefruit *MISCELLANEOUS THERAPEUTIC CLASSES*  Rash. Codeine Phosphate *ANALGESICS - OPIOID*  Family history - wants to avoid potential reaction Perfumes  Rash. Allergies Reconciled   Medication History  Odefsey (200-25-25MG  Tablet, Oral) Active. HydroDiuril (25MG  Tablet, Oral) Active. Medications Reconciled    Review of Systems All other systems negative  Vitals  Weight: 136.5 lb Height: 61in Body  Surface Area: 1.61 m Body Mass Index: 25.79 kg/m  Temp.: 97.15F  Pulse: 110 (Regular)  BP: 120/76(Sitting, Left Arm, Standard)       Physical Exam General Mental Status-Alert. General Appearance-Consistent with stated age. Hydration-Well hydrated. Voice-Normal.  Head and Neck Head-normocephalic, atraumatic with no lesions or palpable masses.  Eye Sclera/Conjunctiva - Bilateral-No scleral icterus.  Chest and Lung Exam Chest and lung exam reveals -quiet, even and easy respiratory effort with no use of accessory muscles. Inspection Chest Wall - Normal. Back - normal.  Breast Note: right side - can no longer feel adenopathy or right breast mass. 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 breast firm.   Cardiovascular Cardiovascular examination reveals -normal pedal pulses bilaterally. Note: regular rate and rhythm  Abdomen Inspection-Inspection Normal. Palpation/Percussion Palpation and Percussion of the abdomen reveal - Soft, Non Tender, No Rebound tenderness, No Rigidity (guarding) and No hepatosplenomegaly.  Peripheral Vascular Upper Extremity Inspection - Bilateral - Normal - No Clubbing, No Cyanosis, No Edema, Pulses Intact. Lower Extremity Palpation - Edema - Bilateral - No edema - Bilateral.  Neurologic Neurologic evaluation reveals -alert and oriented x 3 with no impairment of recent or remote memory. Mental Status-Normal.  Musculoskeletal Global Assessment -Note: no gross deformities.  Normal Exam - Left-Upper Extremity Strength Normal and Lower Extremity Strength Normal. Normal Exam - Right-Upper Extremity Strength Normal and Lower Extremity Strength Normal.  Lymphatic Head & Neck  General Head & Neck Lymphatics: Bilateral - Description - Normal. Axillary  General Axillary Region: Bilateral - Description - Normal. Tenderness - Non Tender.    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 Left side will be problematic to close given prior skin invasion. May require staged closure or skin grafting if cannot get negative skin margins. Pt has had prior radiation on that side.  Discussed that there is a possibility of an open wound while we wait for path to come back.  I reviewed other risks of bleeding and infection, chronic pain, risk of rapid recurrence. Pt has had ALND on that side, so I will take any tissue I find in the axilla, but there may not be anything left. I reviewed that she is not a candidate for immediate reconstruction. BREAST CANCER METASTASIZED TO AXILLARY LYMPH NODE, RIGHT (C50.911) Impression: Will plan right mastectomy, seed targeted lymph node biopsy and sentinel node biopsy on this side. She had resolution of LAD on MR post treatment. Current Plans You are being scheduled for surgery- Our schedulers will call you.  You should hear from our office's scheduling department within 5 working days about the location, date, and time of surgery. We try to make accommodations for patient's preferences in scheduling surgery, but sometimes the OR schedule or the surgeon's schedule prevents [place] from making those accommodations.  If you have not heard from our office [phone]) in 5 working days, call the office and ask for your surgeon's nurse.  If you have other questions about your diagnosis, plan, or surgery, call the office and ask for your surgeon's nurse.  Pt Education - CCS Mastectomy HCI

## 2020-09-30 NOTE — H&P (Signed)
[place] Location: [facility] Surgery Patient #: [id number] DOB: 01-25-1956 Married / Language: English / Race: Black or African American Female   History of Present Illness The patient is a 64 year old female who presents for a follow-up for Breast cancer. IMAGING SOLIS  Pt is a 64 yo F who was referred by [name] for consultation with a diagnosis of bilateral breast cancer 02/2020. The patient had a history of left breast cancer in 1992. At that point, she had lumpectomy, ALND, chemo, and radiation. She presented with skin changes of redness and swelling and a firm breast on the left. Upon diagnostic imaging, the left breast had at least a 3.9 cm mass with skin thickening of around 6 mm. The mass was indistinct and difficult to image. Surprisingly, the right side had adenopathy seen, but no breast masses seen. It originally looked like a single node on mammo, but at ultrasound looked like three clumped nodes. The right side also had skin changes, but less than the left. She had core needle biopsy on both sides. 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 The right axillary LN biopsy was similar with grade 3 invasive ductal carcinoma that was weakly ER + /PR-, her 2-. She has no family history of cancer. She is a G2P2 with first child at age 76. Menarche at age 71. She took hormonal contraception for 13 years, but stopped with her diagnosis of cancer. She had a colonoscopy in 1993. She has never had a bone density study, and last pap was 2017. Of note, the patient has HIV, but it is well controlled.   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 She has had neoadjuvant chemotherapy. She desires bilateral mastectomies. She has tolerated chemo ok. She hasn't had to be admitted. She did have to have dose reduction. Follow up MRI  showed resolution of right axillary adenopathy and left periareolar mass, but minimal response of mass in UOQ of left breast. She thinks that the left breast is softer.    Follow up MR post neoadjuvant chemo 08/13/2020  IMPRESSION: 1. Interval significant response to treatment of the diffuse non-mass enhancement involving the UPPER RIGHT breast. There is a solitary residual 5 mm indeterminate mass in the UPPER OUTER QUADRANT at MIDDLE depth with plateau kinetics. 2. Minimal response to treatment of the diffuse non-mass enhancement throughout the LEFT breast. 3. Resolution of enhancement of the skin and nipple-areolar complex of the RIGHT breast. 4. Near complete resolution of enhancement of the skin and nipple-areolar complex of the LEFT breast. Minimal residual nipple-areola complex enhancement persists. 5. The 3 previously identified pathologic RIGHT axillary lymph nodes are now normal in appearance. There is no pathologic lymphadenopathy currently.  RECOMMENDATION: 1. Treatment plan. 2. If a RIGHT breast sparing procedure is contemplated, then MRI guided biopsy of the solitary mass in the [place] at MIDDLE depth is recommended. 3. If a LEFT breast sparing procedure is contemplated, then MRI guided biopsy of the SUPERIOR and INFERIOR extent of the residual non-mass enhancement is recommended.  BI-RADS CATEGORY 6: Known biopsy-proven malignancy.     imaging is at [place].  pathology is described above.   Allergies  Shellfish  Rash. Grapefruit *MISCELLANEOUS THERAPEUTIC CLASSES*  Rash. Codeine Phosphate *ANALGESICS - OPIOID*  Family history - wants to avoid potential reaction Perfumes  Rash. Allergies Reconciled   Medication History  Odefsey (200-25-25MG  Tablet, Oral) Active. HydroDiuril (25MG  Tablet, Oral) Active. Medications Reconciled    Review of Systems All other systems negative  Vitals  Weight: 136.5 lb Height: 61in Body  Surface Area: 1.61 m Body Mass Index: 25.79 kg/m  Temp.: 97.37F  Pulse: 110 (Regular)  BP: 120/76(Sitting, Left Arm, Standard)       Physical Exam General Mental Status-Alert. General Appearance-Consistent with stated age. Hydration-Well hydrated. Voice-Normal.  Head and Neck Head-normocephalic, atraumatic with no lesions or palpable masses.  Eye Sclera/Conjunctiva - Bilateral-No scleral icterus.  Chest and Lung Exam Chest and lung exam reveals -quiet, even and easy respiratory effort with no use of accessory muscles. Inspection Chest Wall - Normal. Back - normal.  Breast Note: right side - can no longer feel adenopathy or right breast mass. 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 breast firm.   Cardiovascular Cardiovascular examination reveals -normal pedal pulses bilaterally. Note: regular rate and rhythm  Abdomen Inspection-Inspection Normal. Palpation/Percussion Palpation and Percussion of the abdomen reveal - Soft, Non Tender, No Rebound tenderness, No Rigidity (guarding) and No hepatosplenomegaly.  Peripheral Vascular Upper Extremity Inspection - Bilateral - Normal - No Clubbing, No Cyanosis, No Edema, Pulses Intact. Lower Extremity Palpation - Edema - Bilateral - No edema - Bilateral.  Neurologic Neurologic evaluation reveals -alert and oriented x 3 with no impairment of recent or remote memory. Mental Status-Normal.  Musculoskeletal Global Assessment -Note: no gross deformities.  Normal Exam - Left-Upper Extremity Strength Normal and Lower Extremity Strength Normal. Normal Exam - Right-Upper Extremity Strength Normal and Lower Extremity Strength Normal.  Lymphatic Head & Neck  General Head & Neck Lymphatics: Bilateral - Description - Normal. Axillary  General Axillary Region: Bilateral - Description - Normal. Tenderness - Non Tender.    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 Left side will be problematic to close given prior skin invasion. May require staged closure or skin grafting if cannot get negative skin margins. Pt has had prior radiation on that side.  Discussed that there is a possibility of an open wound while we wait for path to come back.  I reviewed other risks of bleeding and infection, chronic pain, risk of rapid recurrence. Pt has had ALND on that side, so I will take any tissue I find in the axilla, but there may not be anything left. I reviewed that she is not a candidate for immediate reconstruction. BREAST CANCER METASTASIZED TO AXILLARY LYMPH NODE, RIGHT (C50.911) Impression: Will plan right mastectomy, seed targeted lymph node biopsy and sentinel node biopsy on this side. She had resolution of LAD on MR post treatment. Current Plans You are being scheduled for surgery- Our schedulers will call you.  You should hear from our office's scheduling department within 5 working days about the location, date, and time of surgery. We try to make accommodations for patient's preferences in scheduling surgery, but sometimes the OR schedule or the surgeon's schedule prevents [place] from making those accommodations.  If you have not heard from our office [phone]) in 5 working days, call the office and ask for your surgeon's nurse.  If you have other questions about your diagnosis, plan, or surgery, call the office and ask for your surgeon's nurse.  Pt Education - CCS Mastectomy HCI

## 2020-10-01 ENCOUNTER — Encounter (HOSPITAL_COMMUNITY)
Admission: RE | Disposition: A | Discharge status: Home / Self Care | Payer: Self-pay | Source: Home / Self Care | Attending: General Surgery

## 2020-10-01 ENCOUNTER — Ambulatory Visit (HOSPITAL_COMMUNITY)
Admission: RE | Admit: 2020-10-01 | Discharge: 2020-10-02 | Disposition: A | Discharge status: Home / Self Care | Payer: BC Managed Care – PPO | Attending: General Surgery | Admitting: General Surgery

## 2020-10-01 ENCOUNTER — Other Ambulatory Visit: Payer: Self-pay

## 2020-10-01 ENCOUNTER — Ambulatory Visit (HOSPITAL_COMMUNITY): Payer: BC Managed Care – PPO | Admitting: Anesthesiology

## 2020-10-01 ENCOUNTER — Ambulatory Visit (HOSPITAL_COMMUNITY)
Admission: RE | Admit: 2020-10-01 | Discharge: 2020-10-01 | Disposition: A | Discharge status: Home / Self Care | Payer: BC Managed Care – PPO | Source: Ambulatory Visit | Attending: General Surgery | Admitting: General Surgery

## 2020-10-01 ENCOUNTER — Encounter (HOSPITAL_COMMUNITY): Payer: Self-pay | Admitting: General Surgery

## 2020-10-01 ENCOUNTER — Ambulatory Visit (HOSPITAL_COMMUNITY): Payer: BC Managed Care – PPO | Admitting: Vascular Surgery

## 2020-10-01 DIAGNOSIS — C50411 Malignant neoplasm of upper-outer quadrant of right female breast: Secondary | ICD-10-CM | POA: Diagnosis present

## 2020-10-01 DIAGNOSIS — Z885 Allergy status to narcotic agent status: Secondary | ICD-10-CM | POA: Diagnosis not present

## 2020-10-01 DIAGNOSIS — C50112 Malignant neoplasm of central portion of left female breast: Secondary | ICD-10-CM | POA: Insufficient documentation

## 2020-10-01 DIAGNOSIS — Z21 Asymptomatic human immunodeficiency virus [HIV] infection status: Secondary | ICD-10-CM | POA: Insufficient documentation

## 2020-10-01 DIAGNOSIS — Z9221 Personal history of antineoplastic chemotherapy: Secondary | ICD-10-CM | POA: Diagnosis not present

## 2020-10-01 DIAGNOSIS — C773 Secondary and unspecified malignant neoplasm of axilla and upper limb lymph nodes: Secondary | ICD-10-CM | POA: Diagnosis not present

## 2020-10-01 DIAGNOSIS — Z886 Allergy status to analgesic agent status: Secondary | ICD-10-CM | POA: Insufficient documentation

## 2020-10-01 DIAGNOSIS — Z923 Personal history of irradiation: Secondary | ICD-10-CM | POA: Insufficient documentation

## 2020-10-01 DIAGNOSIS — C50111 Malignant neoplasm of central portion of right female breast: Secondary | ICD-10-CM

## 2020-10-01 DIAGNOSIS — Z91013 Allergy to seafood: Secondary | ICD-10-CM | POA: Insufficient documentation

## 2020-10-01 DIAGNOSIS — Z888 Allergy status to other drugs, medicaments and biological substances status: Secondary | ICD-10-CM | POA: Insufficient documentation

## 2020-10-01 DIAGNOSIS — C50912 Malignant neoplasm of unspecified site of left female breast: Secondary | ICD-10-CM | POA: Diagnosis present

## 2020-10-01 DIAGNOSIS — Z79899 Other long term (current) drug therapy: Secondary | ICD-10-CM | POA: Insufficient documentation

## 2020-10-01 DIAGNOSIS — C50911 Malignant neoplasm of unspecified site of right female breast: Secondary | ICD-10-CM | POA: Diagnosis present

## 2020-10-01 DIAGNOSIS — Z170 Estrogen receptor positive status [ER+]: Secondary | ICD-10-CM | POA: Diagnosis not present

## 2020-10-01 HISTORY — PX: MASTECTOMY W/ SENTINEL NODE BIOPSY: SHX2001

## 2020-10-01 HISTORY — PX: RADIOACTIVE SEED GUIDED AXILLARY SENTINEL LYMPH NODE: SHX6735

## 2020-10-01 SURGERY — MASTECTOMY WITH SENTINEL LYMPH NODE BIOPSY
Anesthesia: General | Site: Breast | Laterality: Right

## 2020-10-01 MED ORDER — 0.9 % SODIUM CHLORIDE (POUR BTL) OPTIME
TOPICAL | Status: DC | PRN
  Administered 2020-10-01 (×2): 1000 mL

## 2020-10-01 MED ORDER — SUGAMMADEX SODIUM 200 MG/2ML IV SOLN
INTRAVENOUS | Status: DC | PRN
  Administered 2020-10-01: 200 mg via INTRAVENOUS

## 2020-10-01 MED ORDER — EMTRICITAB-RILPIVIR-TENOFOV AF 200-25-25 MG PO TABS
1.0000 | ORAL_TABLET | Freq: Every day | ORAL | Status: DC
  Administered 2020-10-02: 1 via ORAL
  Filled 2020-10-01: qty 1

## 2020-10-01 MED ORDER — ACETAMINOPHEN 500 MG PO TABS: 1000.0000 mg | ORAL_TABLET | Freq: Four times a day (QID) | ORAL | Status: DC | PRN

## 2020-10-01 MED ORDER — MIDAZOLAM HCL 2 MG/2ML IJ SOLN: 2.0000 mg | Freq: Once | INTRAMUSCULAR | Status: AC

## 2020-10-01 MED ORDER — PROMETHAZINE HCL 25 MG/ML IJ SOLN: 6.2500 mg | INTRAMUSCULAR | Status: DC | PRN

## 2020-10-01 MED ORDER — CEFAZOLIN SODIUM-DEXTROSE 2-4 GM/100ML-% IV SOLN
2.0000 g | INTRAVENOUS | Status: AC
  Administered 2020-10-01: 2 g via INTRAVENOUS
  Filled 2020-10-01: qty 100

## 2020-10-01 MED ORDER — DIPHENHYDRAMINE HCL 12.5 MG/5ML PO ELIX: 12.5000 mg | ORAL_SOLUTION | Freq: Four times a day (QID) | ORAL | Status: DC | PRN

## 2020-10-01 MED ORDER — METHYLENE BLUE 0.5 % INJ SOLN
INTRAVENOUS | Status: AC
  Filled 2020-10-01: qty 10

## 2020-10-01 MED ORDER — KCL IN DEXTROSE-NACL 20-5-0.45 MEQ/L-%-% IV SOLN
INTRAVENOUS | Status: AC
  Filled 2020-10-01 (×2): qty 1000

## 2020-10-01 MED ORDER — DEXAMETHASONE SODIUM PHOSPHATE 10 MG/ML IJ SOLN
INTRAMUSCULAR | Status: DC | PRN
  Administered 2020-10-01: 10 mg via INTRAVENOUS

## 2020-10-01 MED ORDER — MEPERIDINE HCL 25 MG/ML IJ SOLN: 6.2500 mg | INTRAMUSCULAR | Status: DC | PRN

## 2020-10-01 MED ORDER — FENTANYL CITRATE (PF) 250 MCG/5ML IJ SOLN
INTRAMUSCULAR | Status: AC
  Filled 2020-10-01: qty 5

## 2020-10-01 MED ORDER — HYDROCHLOROTHIAZIDE 25 MG PO TABS
25.0000 mg | ORAL_TABLET | Freq: Every day | ORAL | Status: DC
  Administered 2020-10-01 – 2020-10-02 (×2): 25 mg via ORAL
  Filled 2020-10-01 (×2): qty 1

## 2020-10-01 MED ORDER — LIDOCAINE-EPINEPHRINE 1 %-1:100000 IJ SOLN
INTRAMUSCULAR | Status: AC
  Filled 2020-10-01: qty 1

## 2020-10-01 MED ORDER — ONDANSETRON HCL 4 MG/2ML IJ SOLN: 4.0000 mg | Freq: Four times a day (QID) | INTRAMUSCULAR | Status: DC | PRN

## 2020-10-01 MED ORDER — ONDANSETRON HCL 4 MG/2ML IJ SOLN
INTRAMUSCULAR | Status: DC | PRN
  Administered 2020-10-01: 4 mg via INTRAVENOUS

## 2020-10-01 MED ORDER — TRAMADOL HCL 50 MG PO TABS: 50.0000 mg | ORAL_TABLET | Freq: Four times a day (QID) | ORAL | Status: DC | PRN

## 2020-10-01 MED ORDER — PHENYLEPHRINE HCL-NACL 10-0.9 MG/250ML-% IV SOLN
INTRAVENOUS | Status: DC | PRN
  Administered 2020-10-01: 10 ug/min via INTRAVENOUS

## 2020-10-01 MED ORDER — GABAPENTIN 100 MG PO CAPS
100.0000 mg | ORAL_CAPSULE | Freq: Two times a day (BID) | ORAL | Status: DC
  Administered 2020-10-01 – 2020-10-02 (×2): 100 mg via ORAL
  Filled 2020-10-01 (×2): qty 1

## 2020-10-01 MED ORDER — HYDROMORPHONE HCL 1 MG/ML IJ SOLN: 0.2500 mg | INTRAMUSCULAR | Status: DC | PRN

## 2020-10-01 MED ORDER — CHLORHEXIDINE GLUCONATE 0.12 % MT SOLN
15.0000 mL | Freq: Once | OROMUCOSAL | Status: AC
  Administered 2020-10-01: 15 mL via OROMUCOSAL
  Filled 2020-10-01: qty 15

## 2020-10-01 MED ORDER — ZOLPIDEM TARTRATE 5 MG PO TABS: 5.0000 mg | ORAL_TABLET | Freq: Every evening | ORAL | Status: DC | PRN

## 2020-10-01 MED ORDER — PHENYLEPHRINE HCL (PRESSORS) 10 MG/ML IV SOLN
INTRAVENOUS | Status: DC | PRN
  Administered 2020-10-01 (×4): 80 ug via INTRAVENOUS

## 2020-10-01 MED ORDER — OXYCODONE HCL 5 MG PO TABS: 5.0000 mg | ORAL_TABLET | Freq: Once | ORAL | Status: DC | PRN

## 2020-10-01 MED ORDER — OXYCODONE HCL 5 MG PO TABS: 5.0000 mg | ORAL_TABLET | ORAL | Status: DC | PRN

## 2020-10-01 MED ORDER — LACTATED RINGERS IV SOLN: INTRAVENOUS | Status: DC

## 2020-10-01 MED ORDER — MIDAZOLAM HCL 2 MG/2ML IJ SOLN
INTRAMUSCULAR | Status: AC
  Administered 2020-10-01: 2 mg via INTRAVENOUS
  Filled 2020-10-01: qty 2

## 2020-10-01 MED ORDER — PROPOFOL 10 MG/ML IV BOLUS
INTRAVENOUS | Status: DC | PRN
  Administered 2020-10-01: 100 mg via INTRAVENOUS
  Administered 2020-10-01: 150 mg via INTRAVENOUS

## 2020-10-01 MED ORDER — DIPHENHYDRAMINE HCL 50 MG/ML IJ SOLN: 12.5000 mg | Freq: Four times a day (QID) | INTRAMUSCULAR | Status: DC | PRN

## 2020-10-01 MED ORDER — BUPIVACAINE-EPINEPHRINE (PF) 0.25% -1:200000 IJ SOLN
INTRAMUSCULAR | Status: DC | PRN
  Administered 2020-10-01 (×2): 30 mL

## 2020-10-01 MED ORDER — MORPHINE SULFATE (PF) 2 MG/ML IV SOLN: 1.0000 mg | INTRAVENOUS | Status: DC | PRN

## 2020-10-01 MED ORDER — SODIUM CHLORIDE (PF) 0.9 % IJ SOLN
INTRAVENOUS | Status: DC | PRN
  Administered 2020-10-01: 5 mL via INTRAMUSCULAR

## 2020-10-01 MED ORDER — SENNOSIDES-DOCUSATE SODIUM 8.6-50 MG PO TABS: 1.0000 | ORAL_TABLET | Freq: Every evening | ORAL | Status: DC | PRN

## 2020-10-01 MED ORDER — LIDOCAINE HCL (CARDIAC) PF 100 MG/5ML IV SOSY
PREFILLED_SYRINGE | INTRAVENOUS | Status: DC | PRN
  Administered 2020-10-01: 20 mg via INTRATRACHEAL

## 2020-10-01 MED ORDER — MIDAZOLAM HCL 2 MG/2ML IJ SOLN: 0.5000 mg | Freq: Once | INTRAMUSCULAR | Status: DC | PRN

## 2020-10-01 MED ORDER — FENTANYL CITRATE (PF) 100 MCG/2ML IJ SOLN
INTRAMUSCULAR | Status: AC
  Administered 2020-10-01: 100 ug via INTRAVENOUS
  Filled 2020-10-01: qty 2

## 2020-10-01 MED ORDER — BUPIVACAINE HCL (PF) 0.25 % IJ SOLN
INTRAMUSCULAR | Status: AC
  Filled 2020-10-01: qty 30

## 2020-10-01 MED ORDER — KETAMINE HCL 10 MG/ML IJ SOLN
INTRAMUSCULAR | Status: DC | PRN
  Administered 2020-10-01 (×2): 20 mg via INTRAVENOUS

## 2020-10-01 MED ORDER — OXYCODONE HCL 5 MG/5ML PO SOLN: 5.0000 mg | Freq: Once | ORAL | Status: DC | PRN

## 2020-10-01 MED ORDER — MIDAZOLAM HCL 2 MG/2ML IJ SOLN
INTRAMUSCULAR | Status: AC
  Filled 2020-10-01: qty 2

## 2020-10-01 MED ORDER — SIMETHICONE 80 MG PO CHEW: 40.0000 mg | CHEWABLE_TABLET | Freq: Four times a day (QID) | ORAL | Status: DC | PRN

## 2020-10-01 MED ORDER — FENTANYL CITRATE (PF) 100 MCG/2ML IJ SOLN: 100.0000 ug | Freq: Once | INTRAMUSCULAR | Status: AC

## 2020-10-01 MED ORDER — ONDANSETRON 4 MG PO TBDP: 4.0000 mg | ORAL_TABLET | Freq: Four times a day (QID) | ORAL | Status: DC | PRN

## 2020-10-01 MED ORDER — KETAMINE HCL 50 MG/5ML IJ SOSY
PREFILLED_SYRINGE | INTRAMUSCULAR | Status: AC
  Filled 2020-10-01: qty 5

## 2020-10-01 MED ORDER — TECHNETIUM TC 99M TILMANOCEPT KIT
1.0000 | PACK | Freq: Once | INTRAVENOUS | Status: AC | PRN
  Administered 2020-10-01: 1 via INTRADERMAL

## 2020-10-01 MED ORDER — ORAL CARE MOUTH RINSE: 15.0000 mL | Freq: Once | OROMUCOSAL | Status: AC

## 2020-10-01 MED ORDER — CEFAZOLIN SODIUM-DEXTROSE 2-4 GM/100ML-% IV SOLN
2.0000 g | Freq: Three times a day (TID) | INTRAVENOUS | Status: AC
  Administered 2020-10-01: 2 g via INTRAVENOUS
  Filled 2020-10-01: qty 100

## 2020-10-01 MED ORDER — METHOCARBAMOL 500 MG PO TABS: 500.0000 mg | ORAL_TABLET | Freq: Four times a day (QID) | ORAL | Status: DC | PRN

## 2020-10-01 MED ORDER — ROCURONIUM BROMIDE 100 MG/10ML IV SOLN
INTRAVENOUS | Status: DC | PRN
  Administered 2020-10-01: 60 mg via INTRAVENOUS
  Administered 2020-10-01: 40 mg via INTRAVENOUS

## 2020-10-01 MED ORDER — ACETAMINOPHEN 500 MG PO TABS: 1000.0000 mg | ORAL_TABLET | Freq: Once | ORAL | Status: DC

## 2020-10-01 MED ORDER — POTASSIUM CHLORIDE CRYS ER 20 MEQ PO TBCR
20.0000 meq | EXTENDED_RELEASE_TABLET | Freq: Two times a day (BID) | ORAL | Status: DC
  Administered 2020-10-01 – 2020-10-02 (×2): 20 meq via ORAL
  Filled 2020-10-01 (×2): qty 1

## 2020-10-01 MED ORDER — FENTANYL CITRATE (PF) 250 MCG/5ML IJ SOLN
INTRAMUSCULAR | Status: DC | PRN
Start: 2020-10-01 — End: 2020-10-01
  Administered 2020-10-01 (×5): 50 ug via INTRAVENOUS

## 2020-10-01 MED ORDER — PROCHLORPERAZINE MALEATE 10 MG PO TABS
10.0000 mg | ORAL_TABLET | Freq: Four times a day (QID) | ORAL | Status: DC | PRN
  Filled 2020-10-01: qty 1

## 2020-10-01 MED ORDER — PROCHLORPERAZINE EDISYLATE 10 MG/2ML IJ SOLN: 5.0000 mg | Freq: Four times a day (QID) | INTRAMUSCULAR | Status: DC | PRN

## 2020-10-01 MED ORDER — ACETAMINOPHEN 500 MG PO TABS
1000.0000 mg | ORAL_TABLET | ORAL | Status: DC
  Filled 2020-10-01: qty 2

## 2020-10-01 MED ORDER — SODIUM CHLORIDE (PF) 0.9 % IJ SOLN: INTRAVENOUS | Status: DC | PRN

## 2020-10-01 MED ORDER — SCOPOLAMINE 1 MG/3DAYS TD PT72
1.0000 | MEDICATED_PATCH | TRANSDERMAL | Status: DC
  Administered 2020-10-01: 1.5 mg via TRANSDERMAL
  Filled 2020-10-01: qty 1

## 2020-10-01 MED ORDER — PROPOFOL 10 MG/ML IV BOLUS
INTRAVENOUS | Status: AC
  Filled 2020-10-01: qty 20

## 2020-10-01 MED ORDER — DEXMEDETOMIDINE (PRECEDEX) IN NS 20 MCG/5ML (4 MCG/ML) IV SYRINGE
PREFILLED_SYRINGE | INTRAVENOUS | Status: DC | PRN
  Administered 2020-10-01 (×3): 4 ug via INTRAVENOUS

## 2020-10-01 SURGICAL SUPPLY — 82 items
ADH SKN CLS APL DERMABOND .7 (GAUZE/BANDAGES/DRESSINGS) ×2
APL PRP STRL LF DISP 70% ISPRP (MISCELLANEOUS) ×2
BINDER BREAST LRG (GAUZE/BANDAGES/DRESSINGS) ×2 IMPLANT
BINDER BREAST XLRG (GAUZE/BANDAGES/DRESSINGS) IMPLANT
BIOPATCH RED 1 DISK 7.0 (GAUZE/BANDAGES/DRESSINGS) ×3 IMPLANT
BIOPATCH RED 1IN DISK 7.0MM (GAUZE/BANDAGES/DRESSINGS) ×1
BNDG COHESIVE 4X5 TAN STRL (GAUZE/BANDAGES/DRESSINGS) ×4 IMPLANT
CANISTER SUCT 3000ML PPV (MISCELLANEOUS) ×4 IMPLANT
CHLORAPREP W/TINT 26 (MISCELLANEOUS) ×4 IMPLANT
CLIP VESOCCLUDE LG 6/CT (CLIP) ×4 IMPLANT
CLIP VESOCCLUDE MED 6/CT (CLIP) ×10 IMPLANT
CLIP VESOCCLUDE SM WIDE 6/CT (CLIP) ×4 IMPLANT
CLOSURE STERI-STRIP 1/2X4 (GAUZE/BANDAGES/DRESSINGS) ×1
CLOSURE WOUND 1/2 X4 (GAUZE/BANDAGES/DRESSINGS) ×3
CLSR STERI-STRIP ANTIMIC 1/2X4 (GAUZE/BANDAGES/DRESSINGS) ×1 IMPLANT
CNTNR URN SCR LID CUP LEK RST (MISCELLANEOUS) ×2 IMPLANT
CONT PATH 16OZ SNAP LID 3702 (MISCELLANEOUS) ×2 IMPLANT
CONT SPEC 4OZ STRL OR WHT (MISCELLANEOUS)
COVER PROBE W GEL 5X96 (DRAPES) ×4 IMPLANT
COVER SURGICAL LIGHT HANDLE (MISCELLANEOUS) ×4 IMPLANT
COVER WAND RF STERILE (DRAPES) ×2 IMPLANT
DERMABOND ADVANCED (GAUZE/BANDAGES/DRESSINGS) ×2
DERMABOND ADVANCED .7 DNX12 (GAUZE/BANDAGES/DRESSINGS) ×2 IMPLANT
DRAIN CHANNEL 19F RND (DRAIN) ×8 IMPLANT
DRAPE HALF SHEET 40X57 (DRAPES) ×2 IMPLANT
DRSG TEGADERM 4X4.75 (GAUZE/BANDAGES/DRESSINGS) ×4 IMPLANT
ELECT CAUTERY BLADE 6.4 (BLADE) ×4 IMPLANT
ELECT REM PT RETURN 9FT ADLT (ELECTROSURGICAL) ×4
ELECTRODE REM PT RTRN 9FT ADLT (ELECTROSURGICAL) ×2 IMPLANT
EVACUATOR SILICONE 100CC (DRAIN) ×8 IMPLANT
FILTER STRAW FLUID ASPIR (MISCELLANEOUS) IMPLANT
GLOVE BIO SURGEON STRL SZ 6 (GLOVE) ×6 IMPLANT
GLOVE BIOGEL PI IND STRL 6.5 (GLOVE) IMPLANT
GLOVE BIOGEL PI IND STRL 7.5 (GLOVE) IMPLANT
GLOVE BIOGEL PI INDICATOR 6.5 (GLOVE) ×4
GLOVE BIOGEL PI INDICATOR 7.5 (GLOVE) ×2
GLOVE ECLIPSE 7.5 STRL STRAW (GLOVE) ×2 IMPLANT
GLOVE INDICATOR 6.5 STRL GRN (GLOVE) ×4 IMPLANT
GLOVE SURG SS PI 6.5 STRL IVOR (GLOVE) ×4 IMPLANT
GOWN STRL REUS W/ TWL LRG LVL3 (GOWN DISPOSABLE) ×2 IMPLANT
GOWN STRL REUS W/ TWL XL LVL3 (GOWN DISPOSABLE) IMPLANT
GOWN STRL REUS W/TWL 2XL LVL3 (GOWN DISPOSABLE) ×4 IMPLANT
GOWN STRL REUS W/TWL LRG LVL3 (GOWN DISPOSABLE) ×8
GOWN STRL REUS W/TWL XL LVL3 (GOWN DISPOSABLE) ×4
KIT BASIN OR (CUSTOM PROCEDURE TRAY) ×4 IMPLANT
KIT TURNOVER KIT B (KITS) ×4 IMPLANT
LIGHT WAVEGUIDE WIDE FLAT (MISCELLANEOUS) ×4 IMPLANT
MARKER SKIN DUAL TIP RULER LAB (MISCELLANEOUS) ×4 IMPLANT
NDL 18GX1X1/2 (RX/OR ONLY) (NEEDLE) IMPLANT
NDL FILTER BLUNT 18X1 1/2 (NEEDLE) IMPLANT
NDL HYPO 25GX1X1/2 BEV (NEEDLE) IMPLANT
NDL SPNL 22GX3.5 QUINCKE BK (NEEDLE) ×2 IMPLANT
NEEDLE 18GX1X1/2 (RX/OR ONLY) (NEEDLE) IMPLANT
NEEDLE FILTER BLUNT 18X 1/2SAF (NEEDLE) ×2
NEEDLE FILTER BLUNT 18X1 1/2 (NEEDLE) ×2 IMPLANT
NEEDLE HYPO 25GX1X1/2 BEV (NEEDLE) ×4 IMPLANT
NEEDLE SPNL 22GX3.5 QUINCKE BK (NEEDLE) IMPLANT
NS IRRIG 1000ML POUR BTL (IV SOLUTION) ×8 IMPLANT
PACK GENERAL/GYN (CUSTOM PROCEDURE TRAY) ×4 IMPLANT
PACK UNIVERSAL I (CUSTOM PROCEDURE TRAY) ×4 IMPLANT
PAD ABD 8X10 STRL (GAUZE/BANDAGES/DRESSINGS) ×4 IMPLANT
PAD ARMBOARD 7.5X6 YLW CONV (MISCELLANEOUS) ×4 IMPLANT
PENCIL SMOKE EVACUATOR (MISCELLANEOUS) ×4 IMPLANT
SPECIMEN JAR X LARGE (MISCELLANEOUS) ×2 IMPLANT
SPONGE LAP 18X18 RF (DISPOSABLE) ×6 IMPLANT
STAPLER VISISTAT 35W (STAPLE) ×4 IMPLANT
STOCKINETTE IMPERVIOUS 9X36 MD (GAUZE/BANDAGES/DRESSINGS) ×4 IMPLANT
STRIP CLOSURE SKIN 1/2X4 (GAUZE/BANDAGES/DRESSINGS) ×5 IMPLANT
SUT ETHILON 2 0 FS 18 (SUTURE) ×8 IMPLANT
SUT MNCRL AB 4-0 PS2 18 (SUTURE) ×4 IMPLANT
SUT MON AB 4-0 PC3 18 (SUTURE) ×4 IMPLANT
SUT SILK 2 0 (SUTURE) ×4
SUT SILK 2 0 PERMA HAND 18 BK (SUTURE) ×4 IMPLANT
SUT SILK 2 0 SH CR/8 (SUTURE) ×2 IMPLANT
SUT SILK 2-0 18XBRD TIE 12 (SUTURE) ×2 IMPLANT
SUT VIC AB 3-0 SH 8-18 (SUTURE) ×8 IMPLANT
SYR 50ML LL SCALE MARK (SYRINGE) ×4 IMPLANT
SYR CONTROL 10ML LL (SYRINGE) IMPLANT
TOWEL GREEN STERILE (TOWEL DISPOSABLE) ×4 IMPLANT
TOWEL GREEN STERILE FF (TOWEL DISPOSABLE) ×4 IMPLANT
TRAY FOLEY W/BAG SLVR 16FR (SET/KITS/TRAYS/PACK) ×4
TRAY FOLEY W/BAG SLVR 16FR ST (SET/KITS/TRAYS/PACK) IMPLANT

## 2020-10-01 NOTE — Interval H&P Note (Signed)
History and Physical Interval Note:  10/01/2020 7:51 AM  Victoria Coffey  has presented today for surgery, with the diagnosis of BILATERAL BREAST CANCER.  The various methods of treatment have been discussed with the patient and family. After consideration of risks, benefits and other options for treatment, the patient has consented to  Procedure(s) with comments: BILATERAL MASTECTOMIES WITH RIGHT SENTINEL LYMPH NODE BIOPSY, LEFT IS MODIFIED RADICAL (Bilateral) - PEC BLOCK, RNFA RADIOACTIVE SEED GUIDED  TARGETED RIGHT SENTINEL LYMPH NODE BIOPSY (Right) - PEC BLOCK, RNFA as a surgical intervention.  The patient's history has been reviewed, patient examined, no change in status, stable for surgery.  I have reviewed the patient's chart and labs.  Questions were answered to the patient's satisfaction.     [name]

## 2020-10-01 NOTE — Anesthesia Procedure Notes (Signed)
Procedure Name: Intubation Date/Time: 10/01/2020 9:23 AM Performed by: Terrence Dupont, RN Pre-anesthesia Checklist: Patient identified, Emergency Drugs available, Suction available and Patient being monitored Patient Re-evaluated:Patient Re-evaluated prior to induction Oxygen Delivery Method: Circle System Utilized Preoxygenation: Pre-oxygenation with 100% oxygen Induction Type: IV induction Ventilation: Mask ventilation without difficulty Laryngoscope Size: Mac and 3 Grade View: Grade III Tube type: Oral Number of attempts: 3 Airway Equipment and Method: Bougie stylet Placement Confirmation: ETT inserted through vocal cords under direct vision,  positive ETCO2 and breath sounds checked- equal and bilateral Secured at: 22 cm Tube secured with: Tape Dental Injury: Teeth and Oropharynx as per pre-operative assessment and Injury to lip  Comments: First look by SRNA MAC 3, grade III view, unable to pass tube. Second look Miller 2, no improvement in view. Third look MAC3 by Dr. Glennon Mac, grade III view, ETT placed over bougie stylet. Small nick to upper lip, not bleeding.

## 2020-10-01 NOTE — Anesthesia Procedure Notes (Addendum)
Anesthesia Regional Block: Pectoralis block   Pre-Anesthetic Checklist: ,, timeout performed, Correct Patient, Correct Site, Correct Laterality, Correct Procedure, Correct Position, risks and benefits discussed, surgical consent, pre-op evaluation,  At surgeon's request and post-op pain management  Laterality: Left  Prep: chloraprep       Needles:  Injection technique: Single-shot  Needle Type: Echogenic Needle     Needle Length: 9cm  Needle Gauge: 21     Additional Needles:   Procedures:,,,, ultrasound used (permanent image in chart),,,,  Narrative:  Start time: 10/01/2020 9:55 AM End time: 10/01/2020 9:57 AM Injection made incrementally with aspirations every 5 mL.  Performed by: Personally  Anesthesiologist: Annye Asa, MD  Additional Notes: Pt identified in Holding room.  Monitors applied. Working IV access confirmed. Sterile prep L clavicle and pec. #21ga ECHOgenic needle between pec minor and serratus, between ribs 4,5 with US guidance .  30cc 0.25% Bupivacaine with 1:200k epi injected incrementally after negative test dose.  Patient asymptomatic, VSS, no heme aspirated, tolerated well.

## 2020-10-01 NOTE — Anesthesia Procedure Notes (Deleted)
Performed by: Kamen Hanken J, CRNA ° ° ° ° ° ° °

## 2020-10-01 NOTE — Anesthesia Postprocedure Evaluation (Signed)
Anesthesia Post Note  Patient: Victoria Coffey  Procedure(s) Performed: BILATERAL MASTECTOMIES WITH RIGHT SENTINEL LYMPH NODE BIOPSIES,RIGHT TARGETED RADIOACTIVE SEED LYMPH NODE, LEFT BREAST MODIFIED RADICAL MASTECTOMY  (Bilateral Breast) RADIOACTIVE SEED GUIDED  TARGETED RIGHT SENTINEL LYMPH NODE BIOPSY (Right Breast)     Patient location during evaluation: PACU Anesthesia Type: General Level of consciousness: awake and alert, patient cooperative and oriented Pain management: pain level controlled (no pain) Vital Signs Assessment: post-procedure vital signs reviewed and stable Respiratory status: spontaneous breathing, nonlabored ventilation and respiratory function stable Cardiovascular status: blood pressure returned to baseline and stable Postop Assessment: no apparent nausea or vomiting Anesthetic complications: no   No complications documented.  Last Vitals:  Vitals:   10/01/20 1307 10/01/20 1322  BP: (!) 122/57 (!) 126/58  Pulse: (!) 55 62  Resp: 20 20  Temp:    SpO2: 100% 100%    Last Pain:  Vitals:   10/01/20 1330  TempSrc:   PainSc: 0-No pain                 Garrie Elenes,E. Sirus Labrie

## 2020-10-01 NOTE — Transfer of Care (Signed)
Immediate Anesthesia Transfer of Care Note  Patient: Victoria Coffey  Procedure(s) Performed: BILATERAL MASTECTOMIES WITH RIGHT SENTINEL LYMPH NODE BIOPSY, LEFT IS MODIFIED RADICAL (Bilateral Breast) RADIOACTIVE SEED GUIDED  TARGETED RIGHT SENTINEL LYMPH NODE BIOPSY (Right Breast)  Patient Location: PACU  Anesthesia Type:General and Regional  Level of Consciousness: drowsy and patient cooperative  Airway & Oxygen Therapy: Patient Spontanous Breathing and Patient connected to face mask oxygen  Post-op Assessment: Report given to RN and Post -op Vital signs reviewed and stable  Post vital signs: Reviewed and stable  Last Vitals:  Vitals Value Taken Time  BP    Temp    Pulse 69 10/01/20 1237  Resp 20 10/01/20 1237  SpO2 100 % 10/01/20 1237  Vitals shown include unvalidated device data.  Last Pain:  Vitals:   10/01/20 0900  TempSrc:   PainSc: 0-No pain         Complications: No complications documented.

## 2020-10-01 NOTE — Op Note (Signed)
Left Modified Radical Mastectomy (left mastectomy with axillary lymph node dissection) , right mastectomy, right seed targeted deep axillary lymph node biopsy and sentinel lymph node biopsy and mapping.    Indications: Pt had left breast cancer in 1992 treated with lumpectomy and ALND, chemo and radiation.  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  She is s/p neoadjuvant chemotherapy.    Left breast cancer cT4N0 , weak +/-/-, central quadrant Right breast cancer cT3N1 weak +/-/-, UOQ  Pre-operative Diagnosis: bilateral breast cancer  Post-operative Diagnosis: same  Surgeon: [name]   Assistant:  Judyann Munson, RNFA  Anesthesia: General endotracheal anesthesia and pectoral block  ASA Class: 3  Procedure Details  The patient was seen in the Holding Room. The risks, benefits, complications, treatment options, and expected outcomes were discussed with the patient. The possibilities of reaction to medication, pulmonary aspiration, bleeding, infection, the need for additional procedures, failure to diagnose a condition, and creating a complication requiring transfusion or operation were discussed with the patient. The patient concurred with the proposed plan, giving informed consent.  The site of surgery properly noted/ma rked. The patient was taken to Operating Room # 2, identified as Victoria Coffey and the procedure verified as left modified radical Mastectomy, right mastectomy, right seed localized deep right axillary LN biopsy, and right sentinel lymph node mapping and biopsy. After induction of anesthesia, the bilateral breast and chest were prepped and draped in standard fashion.  A Time Out was held and the above information confirmed.   Methylene blue was infiltrated under the right nipple.    The left side was addressed first, as I was concerned that the skin would not be able to be closed.   A  incision  was marked out incorporating the lumpectomy scar from 1992.  The incision was made with a #10 blade.  Mastectomy hooks were used to provide elevation of the skin edges, and the cautery was used to create the mastectomy flaps. Lighted retractors were also used to assist.   The dissection was taken deep incorporating some of the muscle fibers. The muscle appeared to be involved with the tumor.  The penetrating vessels were clipped as needed.  The superior flap was taken medially to the lateral sternal border, superiorly to the inferior border of the clavicle.  The inferior flap was similarly created, inferiorly to the inframammary fold and laterally to the border of the latissimus.  The breast was taken off including the pectoralis fascia and the axillary tail marked.  The skin was marked at the cardinal directions and in between for frozen sections of the skin.    An axillary dissection was performed with removal of the associated lymph nodes and surrounding adipose tissue. There was still some contents present despite prior surgery in this region.  This included levels I and II. This was accomplished by exposing the axillary vein anteriorly and inferiorly to the level of the pectoralis minor and laterally over the latissimus dorsi muscle. Posteriorly, the dissection continued to the subscapularis.  Small venous tributaries, lymphatics, and vessels were clipped and ligated or cauterized and divided. The subscapularis muscle was skeletonized. The long thoracic and thoracodorsal neurovascular bundles were identified and preserved.  Hemostasis was achieved with cautery.    The right side was then addressed.  Incision lines were drawn out.  The superior incision was made first.  The lateral flap was created.  The neoprobe was used  to locate the seed containing node.  A picture was taken of this to document the seed presence.  An additional 5 nodes were taken. The remainder of the skin flaps were created and the  breast was taken off the pectoralis.  Hemostasis was achieved with cautery.  One 19 Fr was placed on the right.  The skin was closed with 3-0 vicryl deep dermal sutures and running 4-0 monocryl subcuticular sutures.    The left sided skin frozens were negative for cancer.  The tissue inferiorly was freed up off the abdominal wall to mobilize the skin.  Two 83 Fr Blake drains were placed, with the lateral one in the axilla and the medial one under the skin flaps.  The wound was then closed with a 3-0 Vicryl deep dermal interrupted sutures in an oblique orientation and 4-0 Vicryl subcuticular closure in layers.    Sterile dressings were applied. At the end of the operation, all sponge, instrument, and needle counts were correct.  Findings: Left breast mass very indurated and stuck down to the muscle.  Right sided nodes ranged from cps 49 to 790.     Estimated Blood Loss: 100 mL         Drains: Two left sided 19 Fr blake drains, one right sided 19 Fr blake drain                Specimens: left breast and left axillary contents, right sentinel node with seed, right nodes 1-5, right breast.           Complications:  None; patient tolerated the procedure well.         Disposition: PACU - hemodynamically stable.         Condition: stable

## 2020-10-01 NOTE — Anesthesia Procedure Notes (Signed)
Anesthesia Regional Block: Pectoralis block   Pre-Anesthetic Checklist: ,, timeout performed, Correct Patient, Correct Site, Correct Laterality, Correct Procedure, Correct Position, risks and benefits discussed, surgical consent, pre-op evaluation,  At surgeon's request and post-op pain management  Laterality: Right  Prep: chloraprep       Needles:  Injection technique: Single-shot  Needle Type: Echogenic Needle     Needle Length: 9cm  Needle Gauge: 21     Additional Needles:   Procedures:,,,, ultrasound used (permanent image in chart),,,,  Narrative:  Start time: 10/01/2020 9:47 AM End time: 10/01/2020 9:54 AM Injection made incrementally with aspirations every 5 mL.  Performed by: Personally  Anesthesiologist: Annye Asa, MD  Additional Notes: Pt identified in Holding room.  Monitors applied. Working IV access confirmed. Sterile prep R clavicle and pec.  #21ga ECHOgenic needle between pec minor and serratus, between ribs 4,5 with US guidance.  30cc 0.25% Bupivacaine with 1:200k epi injected incrementally after negative test dose.  Patient asymptomatic, VSS, no heme aspirated, tolerated well.  Jenita Seashore, MD

## 2020-10-02 ENCOUNTER — Encounter (HOSPITAL_COMMUNITY): Payer: Self-pay | Admitting: General Surgery

## 2020-10-02 DIAGNOSIS — C50112 Malignant neoplasm of central portion of left female breast: Secondary | ICD-10-CM | POA: Diagnosis not present

## 2020-10-02 LAB — BASIC METABOLIC PANEL
Anion gap: 11 (ref 5–15)
BUN: 11 mg/dL (ref 8–23)
CO2: 23 mmol/L (ref 22–32)
Calcium: 8.6 mg/dL — ABNORMAL LOW (ref 8.9–10.3)
Chloride: 101 mmol/L (ref 98–111)
Creatinine, Ser: 1.06 mg/dL — ABNORMAL HIGH (ref 0.44–1.00)
GFR, Estimated: 59 mL/min — ABNORMAL LOW (ref >60)
Glucose, Bld: 126 mg/dL — ABNORMAL HIGH (ref 70–99)
Potassium: 3.7 mmol/L (ref 3.5–5.1)
Sodium: 135 mmol/L (ref 135–145)

## 2020-10-02 LAB — CBC
HCT: 27.3 % — ABNORMAL LOW (ref 36.0–46.0)
Hemoglobin: 9.1 g/dL — ABNORMAL LOW (ref 12.0–15.0)
MCH: 34.2 pg — ABNORMAL HIGH (ref 26.0–34.0)
MCHC: 33.3 g/dL (ref 30.0–36.0)
MCV: 102.6 fL — ABNORMAL HIGH (ref 80.0–100.0)
Platelets: 238 10*3/uL (ref 150–400)
RBC: 2.66 MIL/uL — ABNORMAL LOW (ref 3.87–5.11)
RDW: 14.9 % (ref 11.5–15.5)
WBC: 11 10*3/uL — ABNORMAL HIGH (ref 4.0–10.5)
nRBC: 0 % (ref 0.0–0.2)

## 2020-10-02 MED ORDER — HEPARIN SOD (PORK) LOCK FLUSH 100 UNIT/ML IV SOLN
500.0000 [IU] | INTRAVENOUS | Status: AC | PRN
  Administered 2020-10-02: 500 [IU]
  Filled 2020-10-02: qty 5

## 2020-10-02 MED ORDER — GABAPENTIN 100 MG PO CAPS
100.0000 mg | ORAL_CAPSULE | Freq: Two times a day (BID) | ORAL | 0 refills | Status: DC
Start: 2020-10-02 — End: 2020-12-05

## 2020-10-02 MED ORDER — SODIUM CHLORIDE 0.9% FLUSH: 10.0000 mL | INTRAVENOUS | Status: DC | PRN

## 2020-10-02 MED ORDER — CHLORHEXIDINE GLUCONATE CLOTH 2 % EX PADS
6.0000 | MEDICATED_PAD | Freq: Every day | CUTANEOUS | Status: DC
  Administered 2020-10-02: 6 via TOPICAL

## 2020-10-02 MED ORDER — TRAMADOL HCL 50 MG PO TABS: 50.0000 mg | ORAL_TABLET | Freq: Four times a day (QID) | ORAL | 1 refills | Status: DC | PRN

## 2020-10-02 MED ORDER — METHOCARBAMOL 500 MG PO TABS: 500.0000 mg | ORAL_TABLET | Freq: Four times a day (QID) | ORAL | 0 refills | Status: DC | PRN

## 2020-10-02 MED ORDER — SODIUM CHLORIDE 0.9% FLUSH
10.0000 mL | Freq: Two times a day (BID) | INTRAVENOUS | Status: DC
  Administered 2020-10-02: 10 mL

## 2020-10-02 NOTE — Plan of Care (Signed)
  Problem: Education: Goal: Knowledge of disease or condition will improve Outcome: Progressing   Problem: Activity: Goal: Ability to maintain or regain function will improve Outcome: Progressing   Problem: Clinical Measurements: Goal: Postoperative complications will be avoided or minimized Outcome: Progressing   Problem: Self-Concept: Goal: Ability to verbalize positive feelings about self will improve Outcome: Progressing   Problem: Pain Management: Goal: Expressions of feelings of enhanced comfort will increase Outcome: Progressing   Problem: Skin Integrity: Goal: Demonstration of wound healing without infection will improve Outcome: Progressing

## 2020-10-02 NOTE — Discharge Instructions (Signed)
[facility] [place] °[phone] ° °MASTECTOMY: POST OP INSTRUCTIONS ° °Always review your discharge instruction sheet given to you by the facility where your surgery was performed. °IF YOU HAVE DISABILITY OR FAMILY LEAVE FORMS, YOU MUST BRING THEM TO THE OFFICE FOR PROCESSING.   °DO NOT GIVE THEM TO YOUR DOCTOR. °A prescription for pain medication may be given to you upon discharge.  Take your pain medication as prescribed, if needed.  If narcotic pain medicine is not needed, then you may take acetaminophen (Tylenol) or ibuprofen (Advil) as needed. °1. Take your usually prescribed medications unless otherwise directed. °2. If you need a refill on your pain medication, please contact your pharmacy.  They will contact our office to request authorization.  Prescriptions will not be filled after 5pm or on week-ends. °3. You should follow a light diet the first few days after arrival home, such as soup and crackers, etc.  Resume your normal diet the day after surgery. °4. Most patients will experience some swelling and bruising on the chest and underarm.  Ice packs will help.  Swelling and bruising can take several days to resolve.  °5. It is common to experience some constipation if taking pain medication after surgery.  Increasing fluid intake and taking a stool softener (such as Colace) will usually help or prevent this problem from occurring.  A mild laxative (Milk of Magnesia or Miralax) should be taken according to package instructions if there are no bowel movements after 48 hours. °6. Unless discharge instructions indicate otherwise, leave your bandage dry and in place until your next appointment in 3-5 days.  You may take a limited sponge bath.  No tube baths or showers until the drains are removed.  You may have steri-strips (small skin tapes) in place directly over the incision.  These strips should be left on the skin for 7-10 days.  If your surgeon used skin glue on the incision, you may  shower in 24 hours.  The glue will flake off over the next 2-3 weeks.  Any sutures or staples will be removed at the office during your follow-up visit. °7. DRAINS:  If you have drains in place, it is important to keep a list of the amount of drainage produced each day in your drains.  Before leaving the hospital, you should be instructed on drain care.  Call our office if you have any questions about your drains. °8. ACTIVITIES:  You may resume regular (light) daily activities beginning the next day--such as daily self-care, walking, climbing stairs--gradually increasing activities as tolerated.  You may have sexual intercourse when it is comfortable.  Refrain from any heavy lifting or straining until approved by your doctor. °a. You may drive when you are no longer taking prescription pain medication, you can comfortably wear a seatbelt, and you can safely maneuver your car and apply brakes. °b. RETURN TO WORK:  __________________________________________________________ °9. You should see your doctor in the office for a follow-up appointment approximately 3-5 days after your surgery.  Your doctor’s nurse will typically make your follow-up appointment when she calls you with your pathology report.  Expect your pathology report 2-3 business days after your surgery.  You may call to check if you do not hear from us after three days.   °10. OTHER INSTRUCTIONS: ______________________________________________________________________________________________ ____________________________________________________________________________________________ ° °WHEN TO CALL YOUR DOCTOR: °1. Fever over 101.0 °2. Nausea and/or vomiting °3. Extreme swelling or bruising °4. Continued bleeding from incision. °5. Increased pain, redness, or drainage from the   The clinic staff is available to answer your questions during regular business hours.  Please don't hesitate to call and ask to speak to one of the nurses for clinical  concerns.  If you have a medical emergency, go to the nearest emergency room or call 911.  A surgeon from [facility] is always on call at the hospital. [address] [place], [place], [place]  [address] ? [address] [place], [place], [place]   [address] [phone] ? [phone] ? FAX [phone]    [facility] drains are used to remove extra fluid that normally builds up in a surgical wound after surgery. A surgical drain helps to heal a surgical wound. Different kinds of surgical drains include:  Active drains. These drains use suction to pull drainage away from the surgical wound. Drainage flows through a tube to a container outside of the body. With these drains, you need to keep the bulb or the drainage container flat (compressed) at all times, except while you empty it. Flattening the bulb or container creates suction.  Passive drains. These drains allow fluid to drain naturally, by gravity. Drainage flows through a tube to a bandage (dressing) or a container outside of the body. Passive drains do not need to be emptied. A drain is placed during surgery. Right after surgery, drainage is usually bright red and a little thicker than water. The drainage may gradually turn yellow or pink and become thinner. It is likely that your health care provider will remove the drain when the drainage stops or when the amount decreases to 1-2 Tbsp (15-30 mL) during a 24-hour period. Supplies needed:  Tape.  Germ-free cleaning solution (sterile saline).  Cotton swabs.  Split gauze drain sponge: 4 x 4 inches (10 x 10 cm).  Gauze square: 4 x 4 inches (10 x 10 cm). How to care for your surgical drain Care for your drain as told by your health care provider. This is important to help prevent infection. If your drain is placed at your back, or any other hard-to-reach area, ask another person to assist you in performing the following tasks: General care  Keep the  skin around the drain dry and covered with a dressing at all times.  Check your drain area every day for signs of infection. Check for: ? Redness, swelling, or pain. ? Pus or a bad smell. ? Cloudy drainage. ? Tenderness or pressure at the drain exit site. Changing the dressing Follow instructions from your health care provider about how to change your dressing. Change your dressing at least once a day. Change it more often if needed to keep the dressing dry. Make sure you: 1. Gather your supplies. 2. Wash your hands with soap and water before you change your dressing. If soap and water are not available, use hand sanitizer. 3. Remove the old dressing. Avoid using scissors to do that. 4. Wash your hands with soap and water again after removing the old dressing. 5. Use sterile saline to clean your skin around the drain. You may need to use a cotton swab to clean the skin. 6. Place the tube through the slit in a drain sponge. Place the drain sponge so that it covers your wound. 7. Place the gauze square or another drain sponge on top of the drain sponge that is on the wound. Make sure the tube is between those layers. 8. Tape the dressing to your skin. 9. Tape the drainage tube to your  skin 1-2 inches (2.5-5 cm) below the place where the tube enters your body. Taping keeps the tube from pulling on any stitches (sutures) that you have. 10. Wash your hands with soap and water. 11. Write down the color of your drainage and how often you change your dressing. How to empty your active drain  1. Make sure that you have a measuring cup that you can empty your drainage into. 2. Wash your hands with soap and water. If soap and water are not available, use hand sanitizer. 3. Loosen any pins or clips that hold the tube in place. 4. If your health care provider tells you to strip the tube to prevent clots and tube blockages: ? Hold the tube at the skin with one hand. Use your other hand to pinch the  tubing with your thumb and first finger. ? Gently move your fingers down the tube while squeezing very lightly. This clears any drainage, clots, or tissue from the tube. ? You may need to do this several times each day to keep the tube clear. Do not pull on the tube. 5. Open the bulb cap or the drain plug. Do not touch the inside of the cap or the bottom of the plug. 6. Turn the device upside down and gently squeeze. 7. Empty all of the drainage into the measuring cup. 8. Compress the bulb or the container and replace the cap or the plug. To compress the bulb or the container, squeeze it firmly in the middle while you close the cap or plug the container. 9. Write down the amount of drainage that you have in each 24-hour period. If you have less than 2 Tbsp (30 mL) of drainage during 24 hours, contact your health care provider. 10. Flush the drainage down the toilet. 11. Wash your hands with soap and water. Contact a health care provider if:  You have redness, swelling, or pain around your drain area.  You have pus or a bad smell coming from your drain area.  You have a fever or chills.  The skin around your drain is warm to the touch.  The amount of drainage that you have is increasing instead of decreasing.  You have drainage that is cloudy.  There is a sudden stop or a sudden decrease in the amount of drainage that you have.  Your drain tube falls out.  Your active drain does not stay compressed after you empty it. Summary  Surgical drains are used to remove extra fluid that normally builds up in a surgical wound after surgery.  Different kinds of surgical drains include active drains and passive drains. Active drains use suction to pull drainage away from the surgical wound, and passive drains allow fluid to drain naturally.  It is important to care for your drain to prevent infection. If your drain is placed at your back, or any other hard-to-reach area, ask another person to  assist you.  Contact your health care provider if you have redness, swelling, or pain around your drain area. This information is not intended to replace advice given to you by your health care provider. Make sure you discuss any questions you have with your health care provider. Document Revised: 11/23/2018 Document Reviewed: 11/23/2018 Elsevier Patient Education  2020 [employer].

## 2020-10-02 NOTE — Discharge Summary (Signed)
Physician Discharge Summary  Patient ID: Victoria Coffey MRN: [id number] DOB/AGE: 04/02/1956 64 y.o.  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  She did very well overnight with no significant pain. She has some "nerve pain" in her left posterior upper arm.  She denies lightheadedness. She has no evidence of flap hematoma and drain output is low and serosanguinous.    Consults: None  Significant Diagnostic Studies: labs: HCT AM of d/c 27.3, K 3.7.    Treatments: surgery: see above  Discharge Exam: Blood pressure 119/70, pulse 93, temperature 99.4 F (37.4 C), temperature source Oral, resp. rate 18, height 5' (1.524 m), weight 61.7 kg, SpO2 99 %. General appearance: alert, cooperative and no distress Resp: breathing comfortably Chest wall: anticipated bilateral chest wall tenderness.  no hematomas.  drains serosang Extremities: extremities normal, atraumatic, no cyanosis or edema  Disposition: Discharge disposition: 01-Home or Self Care       Discharge Instructions    Call MD for:  difficulty breathing, headache or visual disturbances   Complete by: As directed    Call MD for:  persistant nausea and vomiting   Complete by: As directed    Call MD for:  redness, tenderness, or signs of infection (pain, swelling, redness, odor or green/yellow discharge around incision site)   Complete by: As directed    Call MD for:  severe uncontrolled pain   Complete by: As directed    Call MD for:  temperature >100.4   Complete by: As directed    Diet - low sodium heart healthy   Complete by: As directed    Increase activity slowly    Complete by: As directed      Allergies as of 10/02/2020      Reactions   Codeine Other (See Comments)   Family history - wants to avoid potential reaction   Other    Perfumes cause rash when applied to skin; sensitive to scents.   Grapefruit Bioflavonoid Complex Rash   Pomegranate [punica] Rash   Shellfish Allergy Rash      Medication List    TAKE these medications   acetaminophen 500 MG tablet Commonly known as: TYLENOL Take 1,000 mg by mouth every 6 (six) hours as needed for moderate pain or headache.   Claritin 10 MG tablet Generic drug: loratadine Take 10 mg by mouth See admin instructions. Takes 3-4 days before and after treatment   gabapentin 100 MG capsule Commonly known as: NEURONTIN Take 1 capsule (100 mg total) by mouth 2 (two) times daily.   hydrochlorothiazide 25 MG tablet Commonly known as: HYDRODIURIL Take 1 tablet (25 mg total) by mouth daily.   lidocaine-prilocaine cream Commonly known as: EMLA Apply 1 application topically See admin instructions. Apply 1-2 hours prior to access   methocarbamol 500 MG tablet Commonly known as: ROBAXIN Take 1 tablet (500 mg total) by mouth every 6 (six) hours as needed for muscle spasms.   Odefsey 200-25-25 MG Tabs tablet Generic drug: emtricitabine-rilpivir-tenofovir AF Take 1 tablet by mouth daily with breakfast.   potassium chloride SA 20 MEQ tablet Commonly known as: KLOR-CON Take 1 tablet (20 mEq total) by mouth 2 (two) times daily. Take 1 tab  twice daily for 3 days then 1 tab once daily What changed: additional instructions   traMADol 50 MG tablet Commonly known as: ULTRAM Take 1 tablet (50 mg total) by mouth every 6 (six) hours as needed (mild pain).       Follow-up Information    [name], MD Follow up in 2 week(s).   Specialty: General Surgery Contact information: [address] [place] [address] [phone]               Signed: [name] 10/02/2020, 10:10  AM

## 2020-10-02 NOTE — Progress Notes (Signed)
Nsg Discharge Note  Admit Date:  10/01/2020 Discharge date: 10/02/2020   Myrian Botello Seebeck to be D/C'd home per MD order.  AVS completed.  Copy for chart, and copy for patient signed, and dated. Patient/caregiver able to verbalize understanding.  Discharge Medication: Allergies as of 10/02/2020      Reactions   Codeine Other (See Comments)   Family history - wants to avoid potential reaction   Other    Perfumes cause rash when applied to skin; sensitive to scents.   Grapefruit Bioflavonoid Complex Rash   Pomegranate [punica] Rash   Shellfish Allergy Rash      Medication List    TAKE these medications   acetaminophen 500 MG tablet Commonly known as: TYLENOL Take 1,000 mg by mouth every 6 (six) hours as needed for moderate pain or headache.   Claritin 10 MG tablet Generic drug: loratadine Take 10 mg by mouth See admin instructions. Takes 3-4 days before and after treatment   gabapentin 100 MG capsule Commonly known as: NEURONTIN Take 1 capsule (100 mg total) by mouth 2 (two) times daily.   hydrochlorothiazide 25 MG tablet Commonly known as: HYDRODIURIL Take 1 tablet (25 mg total) by mouth daily.   lidocaine-prilocaine cream Commonly known as: EMLA Apply 1 application topically See admin instructions. Apply 1-2 hours prior to access   methocarbamol 500 MG tablet Commonly known as: ROBAXIN Take 1 tablet (500 mg total) by mouth every 6 (six) hours as needed for muscle spasms.   Odefsey 200-25-25 MG Tabs tablet Generic drug: emtricitabine-rilpivir-tenofovir AF Take 1 tablet by mouth daily with breakfast.   potassium chloride SA 20 MEQ tablet Commonly known as: KLOR-CON Take 1 tablet (20 mEq total) by mouth 2 (two) times daily. Take 1 tab twice daily for 3 days then 1 tab once daily What changed: additional instructions   traMADol 50 MG tablet Commonly known as: ULTRAM Take 1 tablet (50 mg total) by mouth every 6 (six) hours as needed (mild pain).       Discharge  Assessment: Vitals:   10/01/20 2046 10/02/20 0510  BP: 119/72 119/70  Pulse: 96 93  Resp: 18 18  Temp: 100.3 F (37.9 C) 99.4 F (37.4 C)  SpO2: 100% 99%   Skin clean, dry and intact without evidence of skin break down, no evidence of skin tears noted. IV catheter discontinued intact. Site without signs and symptoms of complications - no redness or edema noted at insertion site, patient denies c/o pain - only slight tenderness at site.  Dressing with slight pressure applied.  D/c Instructions-Education: Discharge instructions given to patient/family with verbalized understanding. D/c education completed with patient/family including follow up instructions, medication list, d/c activities limitations if indicated, with other d/c instructions as indicated by MD - patient able to verbalize understanding, all questions fully answered. Patient instructed to return to ED, call 911, or call MD for any changes in condition.  Patient escorted via [place], and D/C home via private auto.  Atilano Ina, RN 10/02/2020 11:45 AM

## 2020-10-02 NOTE — Plan of Care (Signed)
  Problem: Education: Goal: Knowledge of disease or condition will improve Outcome: Adequate for Discharge   Problem: Activity: Goal: Ability to maintain or regain function will improve Outcome: Adequate for Discharge   Problem: Clinical Measurements: Goal: Postoperative complications will be avoided or minimized Outcome: Adequate for Discharge   Problem: Self-Concept: Goal: Ability to verbalize positive feelings about self will improve Outcome: Adequate for Discharge   Problem: Pain Management: Goal: Expressions of feelings of enhanced comfort will increase Outcome: Adequate for Discharge   Problem: Skin Integrity: Goal: Demonstration of wound healing without infection will improve Outcome: Adequate for Discharge   Problem: Education: Goal: Knowledge of General Education information will improve Description: Including pain rating scale, medication(s)/side effects and non-pharmacologic comfort measures Outcome: Adequate for Discharge   Problem: Health Behavior/Discharge Planning: Goal: Ability to manage health-related needs will improve Outcome: Adequate for Discharge   Problem: Clinical Measurements: Goal: Ability to maintain clinical measurements within normal limits will improve Outcome: Adequate for Discharge Goal: Will remain free from infection Outcome: Adequate for Discharge Goal: Diagnostic test results will improve Outcome: Adequate for Discharge Goal: Respiratory complications will improve Outcome: Adequate for Discharge Goal: Cardiovascular complication will be avoided Outcome: Adequate for Discharge   Problem: Activity: Goal: Risk for activity intolerance will decrease Outcome: Adequate for Discharge   Problem: Nutrition: Goal: Adequate nutrition will be maintained Outcome: Adequate for Discharge   Problem: Coping: Goal: Level of anxiety will decrease Outcome: Adequate for Discharge   Problem: Elimination: Goal: Will not experience complications  related to bowel motility Outcome: Adequate for Discharge Goal: Will not experience complications related to urinary retention Outcome: Adequate for Discharge   Problem: Pain Managment: Goal: General experience of comfort will improve Outcome: Adequate for Discharge   Problem: Safety: Goal: Ability to remain free from injury will improve Outcome: Adequate for Discharge   Problem: Skin Integrity: Goal: Risk for impaired skin integrity will decrease Outcome: Adequate for Discharge   Problem: Education: Goal: Required Educational Video(s) Outcome: Adequate for Discharge   Problem: Clinical Measurements: Goal: Postoperative complications will be avoided or minimized Outcome: Adequate for Discharge   Problem: Skin Integrity: Goal: Demonstration of wound healing without infection will improve Outcome: Adequate for Discharge

## 2020-10-04 ENCOUNTER — Ambulatory Visit: Payer: BC Managed Care – PPO

## 2020-10-04 ENCOUNTER — Other Ambulatory Visit: Payer: Self-pay | Admitting: General Surgery

## 2020-10-04 ENCOUNTER — Other Ambulatory Visit: Payer: BC Managed Care – PPO

## 2020-10-04 ENCOUNTER — Ambulatory Visit: Payer: BC Managed Care – PPO | Admitting: Hematology

## 2020-10-04 ENCOUNTER — Telehealth: Payer: Self-pay | Admitting: General Surgery

## 2020-10-04 NOTE — Telephone Encounter (Signed)
Discussed pathology with Dr. Burr Medico and patient.  Will proceed with right ALND.

## 2020-10-07 ENCOUNTER — Encounter: Payer: Self-pay | Admitting: *Deleted

## 2020-10-08 ENCOUNTER — Encounter (HOSPITAL_COMMUNITY): Payer: Self-pay | Admitting: General Surgery

## 2020-10-08 ENCOUNTER — Institutional Professional Consult (permissible substitution): Payer: BC Managed Care – PPO | Admitting: Plastic Surgery

## 2020-10-08 NOTE — Progress Notes (Addendum)
COVID Vaccine Completed: Yes Date COVID Vaccine completed: 03/23/20, 04/15/20 COVID vaccine manufacturer: [place]      PCP - [name], MD Cardiologist -  N/A  Chest x-ray - 03/22/20 in epic EKG - 03/19/20 in epic Stress Test -  N/A ECHO - 03/20/20 in epic Cardiac Cath -  N/A Pacemaker/ICD device last checked: N/A  Sleep Study -  N/A CPAP -  N/A  Fasting Blood Sugar - N/A Checks Blood Sugar __ N/A___ times a day  Blood Thinner Instructions: N/A Aspirin Instructions: N/A Last Dose: N/A  Activity level:     Can go up a flight of stairs without stopping and without symptoms        Anesthesia review:  N/A  Patient denies shortness of breath, fever, cough and chest pain at PAT appointment   Patient verbalized understanding of instructions that were given to them at the PAT appointment. Patient was also instructed that they will need to review over the PAT instructions again at home before surgery.

## 2020-10-09 NOTE — Progress Notes (Signed)
[place]   Telephone:([phone]) [phone] Fax:(336) [phone]   Clinic Follow up Note   Patient Care Team: [name], MD as PCP - General (Infectious Diseases) [name], MD as PCP - Infectious Diseases (Infectious Diseases) [name], MD as Consulting Physician (General Surgery) [name], MD as Consulting Physician (Hematology) [name], MD as Consulting Physician (Radiation Oncology) [name], RN as Oncology Nurse Navigator [name], RN as Oncology Nurse Navigator  Date of Service:  10/11/2020  CHIEF COMPLAINT: F/u of bilateral breast cancer  SUMMARY OF ONCOLOGIC HISTORY: Oncology History Overview Note  Cancer Staging Malignant neoplasm of upper-outer quadrant of left breast in female, estrogen receptor positive ([place]) Staging form: Breast, AJCC 8th Edition - Clinical stage from 03/01/2020: Stage IIIC (cT4, cN0, cM0, G3, ER+, PR-, HER2-) - Signed by [name], MD on 08/22/2020    Malignant neoplasm of upper-outer quadrant of left breast in female, estrogen receptor positive ([place])  02/28/2020 Mammogram   Diagnostic Mammogram 02/28/20  IMPRESSION The 3.9 cm ill defined mass in the left breast posterior depth central 4.3cm to the nipple seen on the mediolateral oblique view onlt with associated difssue skin thickening is highly suspicious for malignancy.    03/01/2020 Cancer Staging   Staging form: Breast, AJCC 8th Edition - Clinical stage from 03/01/2020: Stage IIIC (cT4, cN0, cM0, G3, ER+, PR-, HER2-) - Signed by [name], MD on 08/22/2020   03/01/2020 Initial Biopsy   Diagnosis 03/01/20  1. Breast, left, needle core biopsy, 9 o'clock, 3-4cmfn - INVASIVE DUCTAL CARCINOMA. SEE NOTE 2. Lymph node, needle/core biopsy, right axilla - INVASIVE DUCTAL CARCINOMA. SEE NOTE Diagnosis Note 1. Invasive carcinoma measures 1.5 cm in greatest linear dimension and appears grade 3. Dr. [name] reviewed the case and concurs with the diagnosis. A  breast prognostic profile (ER, PR, Ki-67 and HER2) is pending and will be reported in an addendum. Dr. [name] was notified on 03/04/2020. 2. Carcinoma measures 0.6 cm in greatest linear dimension and appears grade 3. Lymphoid tissue is not identified - this may represent a completely replaced lymph node. A breast prognostic profile (ER, PR, Ki-67 and HER2) is pending and will be reported in an addendum. Dr. [name] reviewed the case and concurs with the diagnosis. Dr. [name] was notified on 03/04/2020.   03/01/2020 Receptors her2   1. PROGNOSTIC INDICATORS Results: IMMUNOHISTOCHEMICAL AND MORPHOMETRIC ANALYSIS PERFORMED MANUALLY The tumor cells are NEGATIVE for Her2 (0). Estrogen Receptor: 20%, POSITIVE, WEAK STAINING INTENSITY Progesterone Receptor: 0%, NEGATIVE Proliferation Marker Ki67: 40% COMMENT: The negative hormone receptor study(ies) in this case has an internal positive control.    2. PROGNOSTIC INDICATORS Results: IMMUNOHISTOCHEMICAL AND MORPHOMETRIC ANALYSIS PERFORMED MANUALLY The tumor cells are NEGATIVE for Her2 (1+). Estrogen Receptor: 10%, POSITIVE, WEAK STAINING INTENSITY Progesterone Receptor: 0%, NEGATIVE Proliferation Marker Ki67: 40% COMMENT: The negative hormone receptor study(ies) in this case has no internal positive control.   03/05/2020 Initial Diagnosis   Malignant neoplasm of upper-outer quadrant of left breast in female, estrogen receptor positive ([place])   03/18/2020 Breast MRI   IMPRESSION: 1. 7 x 9 x 6 cm area of suspicious non masslike enhancement within the central/UPPER RIGHT breast with anterior skin thickening. Given biopsy-proven metastatic RIGHT axillary lymph node, tissue sampling of the anterior and posterior aspects of this non masslike RIGHT breast enhancement is recommended to exclude malignancy. 2. 7 x 6 x 5 cm diffuse masslike and nonmasslike enhancement within the majority of the remaining LEFT breast with diffuse LEFT breast  skin  thickening, compatible with malignancy. Abnormal enhancement is noted along the anterior aspect of the LEFT pectoralis muscle. 3. Three abnormal RIGHT axillary lymph nodes, 1 which is biopsy proven to be metastatic disease. No abnormal LEFT axillary lymph nodes identified.   03/20/2020 Echocardiogram   Baseline ECHO  IMPRESSIONS     1. The patient was reported to be in pain during the study and not able  to participate in this study. All that can be said is that the LV and RV  function are grossly normal. Would recommend to repeat this study when/if  the patient is able to tolerate  the exam.   2. Left ventricular ejection fraction, by estimation, is 60 to 65%. The  left ventricle has normal function. Left ventricular endocardial border  not optimally defined to evaluate regional wall motion. Left ventricular  diastolic function could not be  evaluated.   3. Right ventricular systolic function is normal. The right ventricular  size is normal.   4. The mitral valve was not well visualized. No evidence of mitral valve  regurgitation.   5. The aortic valve was not well visualized. Aortic valve regurgitation  is not visualized.   6. The inferior vena cava is normal in size with greater than 50%  respiratory variability, suggesting right atrial pressure of 3 mmHg.    03/20/2020 Imaging   CT CAP W contrast  IMPRESSION: 1. Small right axillary/subpectoral lymph nodes. Difficult to exclude metastatic disease. 2. Subtle 6 mm low-attenuation lesion in the dome of the liver, not well seen previously. Lesion is too small to characterize. Further evaluation could be performed with MR abdomen without and with contrast, as clinically indicated. 3. Hepatic steatosis. 4. Aortic atherosclerosis (ICD10-I70.0). Coronary artery calcification.     03/20/2020 Imaging   Whole body bone scan  IMPRESSION: 1. Increased activity noted over the right breast, possibly related to the patient's right  breast cancer.   2. Punctate area of increased activity over the left maxillary region, most likely related to sinus disease or dental disease. Increased activity noted over the left mandible also possibly related to dental disease. No other focal bony abnormalities to suggest metastatic disease identified.   03/22/2020 Procedure   PAC placement by Dr [name]    03/22/2020 - 08/23/2020 Chemotherapy   AC q2weeks for 4 cycles 03/22/20-05/03/20 followed by weekly Taxol and Carboplatin q3weeks for 12 weeks starting 05/17/20.                  Week 2 Taxol reduced to half dose and CT was dose reduced starting with week 4 due to thrombocytopenia. Given pancytopenia, will change Carboplatin to 50% dose weekly with Taxol 82m/m2 starting with week 7 (06/13/20). Will hold Carboplatin as needed based on labs. Carboplatin increased to 80% starting with week 8. Carboplatin dose reduced for C11 and held with C12. C12 Taxol postponed to 08/23/20   03/22/2020 Pathology Results   FINAL MICROSCOPIC DIAGNOSIS:   A. BREAST, RIGHT, PUNCH BIOPSY:  - Carcinoma involving dermal lymphatics.  - See comment.   B. BREAST, LEFT, PUNCH BIOPSY:  - Carcinoma involving dermal lymphatics.  - See comment.   COMMENT:  The morphology is compatible with ductal breast carcinoma.    ADDENDUM:   PROGNOSTIC INDICATOR RESULTS:   Immunohistochemical and morphometric analysis performed manually   The tumor cells are NEGATIVE for Her2 (0%).   Estrogen Receptor:       NEGATIVE, 0%  Progesterone Receptor:   NEGATIVE, 0%  08/13/2020 Breast MRI   IMPRESSION: 1. Interval significant response to treatment of the diffuse non-mass enhancement involving the UPPER RIGHT breast. There is a solitary residual 5 mm indeterminate mass in the UPPER OUTER QUADRANT at MIDDLE depth with plateau kinetics. 2. Minimal response to treatment of the diffuse non-mass enhancement throughout the LEFT breast. 3. Resolution of enhancement of the  skin and nipple-areolar complex of the RIGHT breast. 4. Near complete resolution of enhancement of the skin and nipple-areolar complex of the LEFT breast. Minimal residual nipple-areola complex enhancement persists. 5. The 3 previously identified pathologic RIGHT axillary lymph nodes are now normal in appearance. There is no pathologic lymphadenopathy currently.   08/23/2020 -  Chemotherapy   Immunotherapy Keytruda q3weeks starting 08/23/20 for 1 year treatment.    10/01/2020 Surgery   MASTECTOMIES WITH RIGHT SENTINEL LYMPH NODE BIOPSIES,RIGHT TARGETED RADIOACTIVE SEED LYMPH NODE, LEFT BREAST MODIFIED RADICAL MASTECTOMY  and RADIOACTIVE SEED GUIDED  TARGETED RIGHT SENTINEL LYMPH NODE BIOPSY by Dr [name]     10/01/2020 Pathology Results   FINAL MICROSCOPIC DIAGNOSIS:   A. BREAST, LEFT, MASTECTOMY:  - Invasive ductal carcinoma, 7 cm.  - Anterior and posterior soft tissue margins not involved by tumor.  - Foci of skin involvement by tumor, see comment.  - Nipple involvement by tumor.  - See oncology table and comment.   B. LYMPH NODE, LEFT AXILLARY, CONTENTS:  - One lymph node with no metastatic carcinoma (0/1).   C. BREAST, RIGHT, MASTECTOMY:  - Invasive ductal carcinoma, greater than 5 cm.  - Margins not involved.  - Subareolar breast tissue involved by tumor.  - See oncology table and comment.   D. LYMPH NODE, RIGHT #2, SENTINEL, EXCISION:  - Metastatic carcinoma in one lymph node, 0.5 cm (1/1).   E. LYMPH NODE, RIGHT #1, SENTINEL, EXCISION:  - Metastatic carcinoma in one lymph node, 1.5 cm (1/1).  - Biopsy site and biopsy clip.   F. LYMPH NODE, RIGHT #3, SENTINEL, EXCISION:  - Metastatic carcinoma in one lymph node, 1.5 cm (1/1).   G. LYMPH NODE, RIGHT #4, SENTINEL, EXCISION:  - One lymph node with no metastatic carcinoma (0/1).   H. LYMPH NODE, RIGHT #5, SENTINEL, EXCISION:  - Metastatic carcinoma in one lymph node, 0.5 cm (1/1).   I. LYMPH NODE, RIGHT #6,  SENTINEL, EXCISION:  - One lymph node with no metastatic carcinoma (0/1).    10/14/2020 Surgery   RIGHT AXILLARY LYMPH NODE DISSECTION by Dr [name]    Bilateral breast [facility])  10/01/2020 Initial Diagnosis   Bilateral breast cancer [facility])      CURRENT THERAPY:  Keytruda q3weeks starting 08/23/20 for 1 year treatment.  PENDING Right axillary LN Dissection 10/14/20  INTERVAL HISTORY:  Victoria Coffey is here for a follow up. She presents to the clinic alone. She notes her breast pain from surgery improved. She takes pain medication tramadol 1-2 times a day. She still has 3 draining tubes. Her next surgery is 12/13. She notes her appetite has dropped some. She did lose 9 pounds.     REVIEW OF SYSTEMS:   Constitutional: Denies fevers, chills or abnormal weight loss Eyes: Denies blurriness of vision Ears, nose, mouth, throat, and face: Denies mucositis or sore throat Respiratory: Denies cough, dyspnea or wheezes Cardiovascular: Denies palpitation, chest discomfort or lower extremity swelling Gastrointestinal:  Denies nausea, heartburn or change in bowel habits Skin: Denies abnormal skin rashes Lymphatics: Denies new lymphadenopathy or easy bruising Neurological:Denies numbness, tingling or new weaknesses Behavioral/Psych:  Mood is stable, no new changes  All other systems were reviewed with the patient and are negative.  MEDICAL HISTORY:  Past Medical History:  Diagnosis Date  . Aortic atherosclerosis ([place])   . Breast cancer ([place]) dx'd 1993   left  . Fatty liver   . History of left breast cancer   . HIV infection ([place])   . Hypertension   . Port-A-Cath in place     SURGICAL HISTORY: Past Surgical History:  Procedure Laterality Date  . BREAST BIOPSY Bilateral 03/22/2020   Procedure: BILATERAL BREAST PUNCH BIOPSIES;  Surgeon: [name], MD;  Location: [place];  Service: General;  Laterality: Bilateral;  . BREAST LUMPECTOMY    . CESAREAN SECTION    . left breast  lumpectomy  1993  . MASTECTOMY W/ SENTINEL NODE BIOPSY Bilateral 10/01/2020  . MASTECTOMY W/ SENTINEL NODE BIOPSY Bilateral 10/01/2020   Procedure: BILATERAL MASTECTOMIES WITH RIGHT SENTINEL LYMPH NODE BIOPSIES,RIGHT TARGETED RADIOACTIVE SEED LYMPH NODE, LEFT BREAST MODIFIED RADICAL MASTECTOMY ;  Surgeon: [name], MD;  Location: [place];  Service: General;  Laterality: Bilateral;  PEC BLOCK, RNFA  . PORTACATH PLACEMENT Right 03/22/2020   Procedure: INSERTION PORT-A-CATH WITH ULTRASOUND GUIDANCE;  Surgeon: [name], MD;  Location: [place];  Service: General;  Laterality: Right;  . RADIOACTIVE SEED GUIDED AXILLARY SENTINEL LYMPH NODE Right 10/01/2020   Procedure: RADIOACTIVE SEED GUIDED  TARGETED RIGHT SENTINEL LYMPH NODE BIOPSY;  Surgeon: [name], MD;  Location: [place];  Service: General;  Laterality: Right;  PEC BLOCK, RNFA    I have reviewed the social history and family history with the patient and they are unchanged from previous note.  ALLERGIES:  is allergic to codeine, other, grapefruit bioflavonoid complex, pomegranate [punica], and shellfish allergy.  MEDICATIONS:  Current Outpatient Medications  Medication Sig Dispense Refill  . acetaminophen (TYLENOL) 500 MG tablet Take 1,000 mg by mouth every 6 (six) hours as needed for moderate pain or headache.     . emtricitabine-rilpivir-tenofovir AF (ODEFSEY) 200-25-25 MG TABS tablet Take 1 tablet by mouth daily with breakfast. 30 tablet 11  . gabapentin (NEURONTIN) 100 MG capsule Take 1 capsule (100 mg total) by mouth 2 (two) times daily. 60 capsule 0  . hydrochlorothiazide (HYDRODIURIL) 25 MG tablet Take 1 tablet (25 mg total) by mouth daily. 30 tablet 11  . lidocaine-prilocaine (EMLA) cream Apply 1 application topically See admin instructions. Apply 1-2 hours prior to access    . loratadine (CLARITIN) 10 MG tablet Take 10 mg by mouth See admin instructions. Takes 3-4 days before and after treatment     . methocarbamol (ROBAXIN)  500 MG tablet Take 1 tablet (500 mg total) by mouth every 6 (six) hours as needed for muscle spasms. 20 tablet 0  . potassium chloride SA (KLOR-CON) 20 MEQ tablet Take 1 tablet (20 mEq total) by mouth 2 (two) times daily. Take 1 tab twice daily for 3 days then 1 tab once daily (Patient taking differently: Take 20 mEq by mouth 2 (two) times daily. ) 60 tablet 1  . traMADol (ULTRAM) 50 MG tablet Take 1 tablet (50 mg total) by mouth every 6 (six) hours as needed (mild pain). 30 tablet 1   No current facility-administered medications for this visit.    PHYSICAL EXAMINATION: ECOG PERFORMANCE STATUS: 2 - Symptomatic, <50% confined to bed  Vitals:   10/11/20 0822  BP: 121/80  Pulse: 93  Resp: (!) 28  Temp: 97.8 F (36.6 C)  SpO2: 100%  Filed Weights   10/11/20 3662  Weight: 128 lb 12.8 oz (58.4 kg)    GENERAL:alert, no distress and comfortable SKIN: skin color, texture, turgor are normal, no rashes or significant lesions EYES: normal, Conjunctiva are pink and non-injected, sclera clear  NECK: supple, thyroid normal size, non-tender, without nodularity LYMPH:  no palpable lymphadenopathy in the cervical, axillary  LUNGS: clear to auscultation and percussion with normal breathing effort HEART: regular rate & rhythm and no murmurs and no lower extremity edema ABDOMEN:abdomen soft, non-tender and normal bowel sounds Musculoskeletal:no cyanosis of digits and no clubbing  NEURO: alert & oriented x 3 with fluent speech, no focal motor/sensory deficits BREAST: S/p b/l mastectomy: Surgical incisions healing well.(+) 3 drains placed.    LABORATORY DATA:  I have reviewed the data as listed CBC Latest Ref Rng & Units 10/11/2020 10/02/2020 09/24/2020  WBC 4.0 - 10.5 K/uL 4.1 11.0(H) 4.0  Hemoglobin 12.0 - 15.0 g/dL 9.3(L) 9.1(L) 10.8(L)  Hematocrit 36.0 - 46.0 % 28.6(L) 27.3(L) 33.6(L)  Platelets 150 - 400 K/uL 258 238 273     CMP Latest Ref Rng & Units 10/11/2020 10/02/2020 09/24/2020   Glucose 70 - 99 mg/dL 102(H) 126(H) 99  BUN 8 - 23 mg/dL 13 11 7(L)  Creatinine 0.44 - 1.00 mg/dL 0.90 1.06(H) 0.87  Sodium 135 - 145 mmol/L 138 135 137  Potassium 3.5 - 5.1 mmol/L 3.4(L) 3.7 4.6  Chloride 98 - 111 mmol/L 104 101 107  CO2 22 - 32 mmol/L 25 23 19(L)  Calcium 8.9 - 10.3 mg/dL 9.4 8.6(L) 9.0  Total Protein 6.5 - 8.1 g/dL 6.9 - -  Total Bilirubin 0.3 - 1.2 mg/dL <0.2(L) - -  Alkaline Phos 38 - 126 U/L 198(H) - -  AST 15 - 41 U/L 32 - -  ALT 0 - 44 U/L 21 - -      RADIOGRAPHIC STUDIES: I have personally reviewed the radiological images as listed and agreed with the findings in the report. No results found.   ASSESSMENT & PLAN:  Victoria Coffey is a 64 y.o. 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 -Further work up showed 9cm nodular area in right breast and skin biopsy confirmed cancer involvement -Staging scans were negative for distant metastasis -She completed AC X4 and weekly TC (Carboplatin intermittent held for cytopenias) 03/22/20-08/23/20.  -Given recent publish Keynote 522 trial data, I started her on Immunotherapy Keytruda q3weeks for 1 year of treatment beginning 08/23/20.  -She underwent b/l mastectomy with Dr [name] on 10/01/20. Her surgical pathology showed 7cm invasive ductal carcinoma of left breast was completely removed with clear margins and LN negative. Her >5cm invasive ductal carcinoma of right breast was completely removed, clear margins but 4/6 positive LN. I personally reviewed with patient today.  -She will proceed with right axillary LN dissection on 10/14/20 with Dr [name]. I discussed  with Dr. [name] last week  -I discussed she did have good partial response to neoadjuvant treatment but given the significant residual disease in breasts and right axillary LNs, she still has high risk of recurrence after surgery. I recommend adjuvant chemo with oral Xeloda BID 2 weeks on/1 week off to reduce her high risk of recurrence. She is agreeable.  Plan to start 3-4 weeks after next surgery  -She will continue Keytruda every 3 weeks to complete 1 year treatment.  -After she recovers from surgery, she will also proceed with Adjuvant Radiation to right chest wall and axilla. She is agreeable.  -She is recovering from b/l mastectomy well. She has improving breast pain and 3 drains remain. She will continue to f/u with Dr [name].  -Next Keytruda on 10/24/20. She will f/u with NP Lacie at that time.    2.Pancytopenia secondary to chemo -Mostly resolved off chemo. She has moderate anemia after b/l breast surgery today with Hg 9.3 (10/11/20)  3. H/o Left breast cancer, Dx in 1993s/p lumpectomy, radiation, chemo, Tamoxifen.Genetic Testinghas not been done yet.  4. Comorbidities: HIV(+)Dx 1993, HTN -Her HIV has been undetectable and well controlled. She is currently being seen by ID Dr Graylon Good   PLAN: -Proceed with right ALND on 10/14/20 -Lab, flush, F/u with NP Lacie and Keytruda on 10/24/20, if she recovers well by then, plan to start adjuvant Xeloda, I will call in today    No problem-specific Assessment & Plan notes found for this encounter.   No orders of the defined types were placed in this encounter.  All questions were answered. The patient knows to call the clinic with any problems, questions or concerns. No barriers to learning was detected. The total time spent in the appointment was 30 minutes.     [name], MD 10/11/2020   I, Joslyn Devon, am acting as scribe for [name], MD.   I have reviewed the above documentation for accuracy and completeness, and I  agree with the above.

## 2020-10-10 ENCOUNTER — Encounter (HOSPITAL_COMMUNITY): Payer: Self-pay | Admitting: General Surgery

## 2020-10-10 ENCOUNTER — Other Ambulatory Visit (HOSPITAL_COMMUNITY)
Admission: RE | Admit: 2020-10-10 | Discharge: 2020-10-10 | Disposition: A | Discharge status: Home / Self Care | Payer: BC Managed Care – PPO | Source: Ambulatory Visit | Attending: General Surgery | Admitting: General Surgery

## 2020-10-10 ENCOUNTER — Other Ambulatory Visit: Payer: Self-pay

## 2020-10-10 DIAGNOSIS — Z20822 Contact with and (suspected) exposure to covid-19: Secondary | ICD-10-CM | POA: Diagnosis not present

## 2020-10-10 DIAGNOSIS — Z01812 Encounter for preprocedural laboratory examination: Secondary | ICD-10-CM | POA: Diagnosis not present

## 2020-10-10 LAB — SARS CORONAVIRUS 2 (TAT 6-24 HRS): SARS Coronavirus 2: NEGATIVE

## 2020-10-10 LAB — SURGICAL PATHOLOGY

## 2020-10-11 ENCOUNTER — Inpatient Hospital Stay (HOSPITAL_BASED_OUTPATIENT_CLINIC_OR_DEPARTMENT_OTHER): Payer: BC Managed Care – PPO | Admitting: Hematology

## 2020-10-11 ENCOUNTER — Telehealth: Payer: Self-pay

## 2020-10-11 ENCOUNTER — Telehealth: Payer: Self-pay | Admitting: Pharmacist

## 2020-10-11 ENCOUNTER — Inpatient Hospital Stay: Payer: BC Managed Care – PPO | Attending: Hematology

## 2020-10-11 ENCOUNTER — Other Ambulatory Visit: Payer: Self-pay

## 2020-10-11 ENCOUNTER — Encounter: Payer: Self-pay | Admitting: Hematology

## 2020-10-11 ENCOUNTER — Inpatient Hospital Stay: Payer: BC Managed Care – PPO

## 2020-10-11 VITALS — BP 121/80 | HR 93 | Temp 97.8°F | Resp 28 | Ht 60.0 in | Wt 128.8 lb

## 2020-10-11 DIAGNOSIS — I10 Essential (primary) hypertension: Secondary | ICD-10-CM | POA: Insufficient documentation

## 2020-10-11 DIAGNOSIS — Z853 Personal history of malignant neoplasm of breast: Secondary | ICD-10-CM | POA: Diagnosis not present

## 2020-10-11 DIAGNOSIS — C50412 Malignant neoplasm of upper-outer quadrant of left female breast: Secondary | ICD-10-CM | POA: Diagnosis not present

## 2020-10-11 DIAGNOSIS — Z170 Estrogen receptor positive status [ER+]: Secondary | ICD-10-CM

## 2020-10-11 DIAGNOSIS — Z923 Personal history of irradiation: Secondary | ICD-10-CM | POA: Diagnosis not present

## 2020-10-11 DIAGNOSIS — C50911 Malignant neoplasm of unspecified site of right female breast: Secondary | ICD-10-CM | POA: Insufficient documentation

## 2020-10-11 DIAGNOSIS — Z5112 Encounter for antineoplastic immunotherapy: Secondary | ICD-10-CM | POA: Diagnosis not present

## 2020-10-11 DIAGNOSIS — Z95828 Presence of other vascular implants and grafts: Secondary | ICD-10-CM

## 2020-10-11 DIAGNOSIS — Z171 Estrogen receptor negative status [ER-]: Secondary | ICD-10-CM | POA: Diagnosis not present

## 2020-10-11 DIAGNOSIS — Z21 Asymptomatic human immunodeficiency virus [HIV] infection status: Secondary | ICD-10-CM | POA: Diagnosis not present

## 2020-10-11 DIAGNOSIS — D61810 Antineoplastic chemotherapy induced pancytopenia: Secondary | ICD-10-CM | POA: Insufficient documentation

## 2020-10-11 LAB — CMP (CANCER CENTER ONLY)
ALT: 21 U/L (ref 0–44)
AST: 32 U/L (ref 15–41)
Albumin: 3.1 g/dL — ABNORMAL LOW (ref 3.5–5.0)
Alkaline Phosphatase: 198 U/L — ABNORMAL HIGH (ref 38–126)
Anion gap: 9 (ref 5–15)
BUN: 13 mg/dL (ref 8–23)
CO2: 25 mmol/L (ref 22–32)
Calcium: 9.4 mg/dL (ref 8.9–10.3)
Chloride: 104 mmol/L (ref 98–111)
Creatinine: 0.9 mg/dL (ref 0.44–1.00)
GFR, Estimated: >60 mL/min (ref >60)
Glucose, Bld: 102 mg/dL — ABNORMAL HIGH (ref 70–99)
Potassium: 3.4 mmol/L — ABNORMAL LOW (ref 3.5–5.1)
Sodium: 138 mmol/L (ref 135–145)
Total Bilirubin: <0.2 mg/dL — ABNORMAL LOW (ref 0.3–1.2)
Total Protein: 6.9 g/dL (ref 6.5–8.1)

## 2020-10-11 LAB — CBC WITH DIFFERENTIAL (CANCER CENTER ONLY)
Abs Immature Granulocytes: 0 10*3/uL (ref 0.00–0.07)
Basophils Absolute: 0 10*3/uL (ref 0.0–0.1)
Basophils Relative: 0 %
Eosinophils Absolute: 0.1 10*3/uL (ref 0.0–0.5)
Eosinophils Relative: 3 %
HCT: 28.6 % — ABNORMAL LOW (ref 36.0–46.0)
Hemoglobin: 9.3 g/dL — ABNORMAL LOW (ref 12.0–15.0)
Immature Granulocytes: 0 %
Lymphocytes Relative: 25 %
Lymphs Abs: 1 10*3/uL (ref 0.7–4.0)
MCH: 32.6 pg (ref 26.0–34.0)
MCHC: 32.5 g/dL (ref 30.0–36.0)
MCV: 100.4 fL — ABNORMAL HIGH (ref 80.0–100.0)
Monocytes Absolute: 0.3 10*3/uL (ref 0.1–1.0)
Monocytes Relative: 7 %
Neutro Abs: 2.7 10*3/uL (ref 1.7–7.7)
Neutrophils Relative %: 65 %
Platelet Count: 258 10*3/uL (ref 150–400)
RBC: 2.85 MIL/uL — ABNORMAL LOW (ref 3.87–5.11)
RDW: 13.6 % (ref 11.5–15.5)
WBC Count: 4.1 10*3/uL (ref 4.0–10.5)
nRBC: 0 % (ref 0.0–0.2)

## 2020-10-11 MED ORDER — HEPARIN SOD (PORK) LOCK FLUSH 100 UNIT/ML IV SOLN
500.0000 [IU] | Freq: Once | INTRAVENOUS | Status: AC
  Administered 2020-10-11: 500 [IU]
  Filled 2020-10-11: qty 5

## 2020-10-11 MED ORDER — SODIUM CHLORIDE 0.9% FLUSH
10.0000 mL | Freq: Once | INTRAVENOUS | Status: AC
  Administered 2020-10-11: 10 mL
  Filled 2020-10-11: qty 10

## 2020-10-11 MED ORDER — HEPARIN SOD (PORK) LOCK FLUSH 100 UNIT/ML IV SOLN
500.0000 [IU] | Freq: Once | INTRAVENOUS | Status: DC
  Filled 2020-10-11: qty 5

## 2020-10-11 MED ORDER — CAPECITABINE 500 MG PO TABS: 1000.0000 mg/m2 | ORAL_TABLET | Freq: Two times a day (BID) | ORAL | 1 refills | Status: DC

## 2020-10-11 NOTE — Telephone Encounter (Signed)
Oral Oncology Patient Advocate Encounter  Prior Authorization for Xeloda has been approved.    PA# [id number] Effective dates: 10/11/20 through 10/10/21  Patients co-pay is $0  Oral Oncology Clinic will continue to follow.   Victoria Coffey Patient [place] Phone [phone] Fax [phone] 10/11/2020 3:43 PM

## 2020-10-11 NOTE — Patient Instructions (Signed)

## 2020-10-11 NOTE — Telephone Encounter (Signed)
Oral Oncology Pharmacist Encounter  Received new prescription for Xeloda (capecitabine) for the adjuvant treatment of triple negative breast cancer, planned duration ~6-8 cycles. Patient additionally continue pembrolizumab to complete 1 year of treatment.   Prescription dose and frequency assessed for appropriateness.   CMP and CBC w/ Diff from 10/11/20 assessed, noted Hgb 9.3 g/dL and alk phos elevated at 198 U/L - no baseline dose adjustments required.   Current medication list in Epic reviewed, no relevant/significant DDIs with Xeloda identified.  Evaluated chart and no patient barriers to medication adherence noted.   Prescription has been e-scribed to the [facility] for benefits analysis and approval.  Oral Oncology Clinic will continue to follow for insurance authorization, copayment issues, initial counseling and start date.  Leron Croak, PharmD, BCPS Hematology/Oncology Clinical Pharmacist [facility] [phone] 10/11/2020 11:03 AM

## 2020-10-11 NOTE — Telephone Encounter (Signed)
Oral Oncology Patient Advocate Encounter  Received notification from [facility] that prior authorization for Xeloda is required.  PA submitted on CoverMyMeds Key [id number] Status is pending  Oral Oncology Clinic will continue to follow.  [place] Patient [place] Phone [phone] Fax [phone] 10/11/2020 10:54 AM

## 2020-10-12 LAB — CANCER ANTIGEN 27.29: CA 27.29: 127 U/mL — ABNORMAL HIGH (ref 0.0–38.6)

## 2020-10-14 ENCOUNTER — Encounter (HOSPITAL_COMMUNITY)
Admission: RE | Disposition: A | Discharge status: Home / Self Care | Payer: Self-pay | Source: Ambulatory Visit | Attending: General Surgery

## 2020-10-14 ENCOUNTER — Ambulatory Visit (HOSPITAL_COMMUNITY)
Admission: RE | Admit: 2020-10-14 | Discharge: 2020-10-14 | Disposition: A | Discharge status: Home / Self Care | Payer: BC Managed Care – PPO | Source: Ambulatory Visit | Attending: General Surgery | Admitting: General Surgery

## 2020-10-14 ENCOUNTER — Encounter: Payer: Self-pay | Admitting: *Deleted

## 2020-10-14 ENCOUNTER — Ambulatory Visit (HOSPITAL_COMMUNITY): Payer: BC Managed Care – PPO | Admitting: Certified Registered"

## 2020-10-14 ENCOUNTER — Encounter (HOSPITAL_COMMUNITY): Payer: Self-pay | Admitting: General Surgery

## 2020-10-14 DIAGNOSIS — Z21 Asymptomatic human immunodeficiency virus [HIV] infection status: Secondary | ICD-10-CM | POA: Insufficient documentation

## 2020-10-14 DIAGNOSIS — Z886 Allergy status to analgesic agent status: Secondary | ICD-10-CM | POA: Diagnosis not present

## 2020-10-14 DIAGNOSIS — C50112 Malignant neoplasm of central portion of left female breast: Secondary | ICD-10-CM | POA: Insufficient documentation

## 2020-10-14 DIAGNOSIS — Z91013 Allergy to seafood: Secondary | ICD-10-CM | POA: Diagnosis not present

## 2020-10-14 DIAGNOSIS — Z79899 Other long term (current) drug therapy: Secondary | ICD-10-CM | POA: Diagnosis not present

## 2020-10-14 DIAGNOSIS — C773 Secondary and unspecified malignant neoplasm of axilla and upper limb lymph nodes: Secondary | ICD-10-CM | POA: Diagnosis not present

## 2020-10-14 DIAGNOSIS — Z885 Allergy status to narcotic agent status: Secondary | ICD-10-CM | POA: Diagnosis not present

## 2020-10-14 DIAGNOSIS — Z923 Personal history of irradiation: Secondary | ICD-10-CM | POA: Diagnosis not present

## 2020-10-14 DIAGNOSIS — Z170 Estrogen receptor positive status [ER+]: Secondary | ICD-10-CM | POA: Diagnosis not present

## 2020-10-14 DIAGNOSIS — Z91018 Allergy to other foods: Secondary | ICD-10-CM | POA: Insufficient documentation

## 2020-10-14 DIAGNOSIS — Z9221 Personal history of antineoplastic chemotherapy: Secondary | ICD-10-CM | POA: Diagnosis not present

## 2020-10-14 DIAGNOSIS — C50411 Malignant neoplasm of upper-outer quadrant of right female breast: Secondary | ICD-10-CM | POA: Insufficient documentation

## 2020-10-14 DIAGNOSIS — C50911 Malignant neoplasm of unspecified site of right female breast: Secondary | ICD-10-CM | POA: Diagnosis present

## 2020-10-14 HISTORY — PX: AXILLARY LYMPH NODE DISSECTION: SHX5229

## 2020-10-14 HISTORY — DX: Atherosclerosis of aorta: I70.0

## 2020-10-14 HISTORY — DX: Fatty (change of) liver, not elsewhere classified: K76.0

## 2020-10-14 LAB — CBC
HCT: 28.1 % — ABNORMAL LOW (ref 36.0–46.0)
Hemoglobin: 8.8 g/dL — ABNORMAL LOW (ref 12.0–15.0)
MCH: 33.1 pg (ref 26.0–34.0)
MCHC: 31.3 g/dL (ref 30.0–36.0)
MCV: 105.6 fL — ABNORMAL HIGH (ref 80.0–100.0)
Platelets: 272 10*3/uL (ref 150–400)
RBC: 2.66 MIL/uL — ABNORMAL LOW (ref 3.87–5.11)
RDW: 14.2 % (ref 11.5–15.5)
WBC: 5.7 10*3/uL (ref 4.0–10.5)
nRBC: 0 % (ref 0.0–0.2)

## 2020-10-14 LAB — COMPREHENSIVE METABOLIC PANEL
ALT: 23 U/L (ref 0–44)
AST: 30 U/L (ref 15–41)
Albumin: 3.1 g/dL — ABNORMAL LOW (ref 3.5–5.0)
Alkaline Phosphatase: 159 U/L — ABNORMAL HIGH (ref 38–126)
Anion gap: 9 (ref 5–15)
BUN: 19 mg/dL (ref 8–23)
CO2: 25 mmol/L (ref 22–32)
Calcium: 8.7 mg/dL — ABNORMAL LOW (ref 8.9–10.3)
Chloride: 108 mmol/L (ref 98–111)
Creatinine, Ser: 1.07 mg/dL — ABNORMAL HIGH (ref 0.44–1.00)
GFR, Estimated: 58 mL/min — ABNORMAL LOW (ref >60)
Glucose, Bld: 99 mg/dL (ref 70–99)
Potassium: 3.9 mmol/L (ref 3.5–5.1)
Sodium: 142 mmol/L (ref 135–145)
Total Bilirubin: 0.3 mg/dL (ref 0.3–1.2)
Total Protein: 6.4 g/dL — ABNORMAL LOW (ref 6.5–8.1)

## 2020-10-14 SURGERY — LYMPHADENECTOMY, AXILLARY
Anesthesia: General | Site: Breast | Laterality: Right

## 2020-10-14 MED ORDER — PHENYLEPHRINE 40 MCG/ML (10ML) SYRINGE FOR IV PUSH (FOR BLOOD PRESSURE SUPPORT)
PREFILLED_SYRINGE | INTRAVENOUS | Status: DC | PRN
  Administered 2020-10-14 (×3): 40 ug via INTRAVENOUS

## 2020-10-14 MED ORDER — DEXAMETHASONE SODIUM PHOSPHATE 10 MG/ML IJ SOLN
INTRAMUSCULAR | Status: DC | PRN
  Administered 2020-10-14: 4 mg via INTRAVENOUS

## 2020-10-14 MED ORDER — ONDANSETRON HCL 4 MG/2ML IJ SOLN: 4.0000 mg | Freq: Four times a day (QID) | INTRAMUSCULAR | Status: DC | PRN

## 2020-10-14 MED ORDER — DEXAMETHASONE SODIUM PHOSPHATE 10 MG/ML IJ SOLN
INTRAMUSCULAR | Status: AC
  Filled 2020-10-14: qty 1

## 2020-10-14 MED ORDER — OXYCODONE HCL 5 MG/5ML PO SOLN: 5.0000 mg | Freq: Once | ORAL | Status: DC | PRN

## 2020-10-14 MED ORDER — CEFAZOLIN SODIUM-DEXTROSE 2-4 GM/100ML-% IV SOLN
2.0000 g | INTRAVENOUS | Status: AC
  Administered 2020-10-14: 08:00:00 2 g via INTRAVENOUS
  Filled 2020-10-14: qty 100

## 2020-10-14 MED ORDER — LIDOCAINE 2% (20 MG/ML) 5 ML SYRINGE
INTRAMUSCULAR | Status: DC | PRN
  Administered 2020-10-14: 60 mg via INTRAVENOUS

## 2020-10-14 MED ORDER — PHENYLEPHRINE 40 MCG/ML (10ML) SYRINGE FOR IV PUSH (FOR BLOOD PRESSURE SUPPORT)
PREFILLED_SYRINGE | INTRAVENOUS | Status: AC
  Filled 2020-10-14: qty 10

## 2020-10-14 MED ORDER — LIDOCAINE HCL (PF) 1 % IJ SOLN
INTRAMUSCULAR | Status: AC
  Filled 2020-10-14: qty 30

## 2020-10-14 MED ORDER — CHLORHEXIDINE GLUCONATE CLOTH 2 % EX PADS: 6.0000 | MEDICATED_PAD | Freq: Once | CUTANEOUS | Status: DC

## 2020-10-14 MED ORDER — 0.9 % SODIUM CHLORIDE (POUR BTL) OPTIME
TOPICAL | Status: DC | PRN
  Administered 2020-10-14: 09:00:00 1000 mL

## 2020-10-14 MED ORDER — PROPOFOL 10 MG/ML IV BOLUS
INTRAVENOUS | Status: DC | PRN
  Administered 2020-10-14: 130 mg via INTRAVENOUS

## 2020-10-14 MED ORDER — PROPOFOL 10 MG/ML IV BOLUS
INTRAVENOUS | Status: AC
  Filled 2020-10-14: qty 40

## 2020-10-14 MED ORDER — FENTANYL CITRATE (PF) 100 MCG/2ML IJ SOLN
25.0000 ug | INTRAMUSCULAR | Status: DC | PRN
  Administered 2020-10-14 (×2): 50 ug via INTRAVENOUS

## 2020-10-14 MED ORDER — TRAMADOL HCL 50 MG PO TABS: 50.0000 mg | ORAL_TABLET | Freq: Four times a day (QID) | ORAL | 1 refills | Status: DC | PRN

## 2020-10-14 MED ORDER — ACETAMINOPHEN 500 MG PO TABS
1000.0000 mg | ORAL_TABLET | ORAL | Status: DC
  Filled 2020-10-14: qty 2

## 2020-10-14 MED ORDER — LIDOCAINE HCL (PF) 1 % IJ SOLN
INTRAMUSCULAR | Status: DC | PRN
  Administered 2020-10-14: 10 mL

## 2020-10-14 MED ORDER — LACTATED RINGERS IV SOLN: INTRAVENOUS | Status: DC

## 2020-10-14 MED ORDER — GABAPENTIN 100 MG PO CAPS
100.0000 mg | ORAL_CAPSULE | ORAL | Status: AC
  Administered 2020-10-14: 07:00:00 100 mg via ORAL
  Filled 2020-10-14: qty 1

## 2020-10-14 MED ORDER — OXYCODONE HCL 5 MG PO TABS: 5.0000 mg | ORAL_TABLET | Freq: Once | ORAL | Status: DC | PRN

## 2020-10-14 MED ORDER — FENTANYL CITRATE (PF) 100 MCG/2ML IJ SOLN
INTRAMUSCULAR | Status: AC
  Filled 2020-10-14: qty 2

## 2020-10-14 MED ORDER — ORAL CARE MOUTH RINSE: 15.0000 mL | Freq: Once | OROMUCOSAL | Status: AC

## 2020-10-14 MED ORDER — ONDANSETRON HCL 4 MG/2ML IJ SOLN
INTRAMUSCULAR | Status: AC
  Filled 2020-10-14: qty 2

## 2020-10-14 MED ORDER — FENTANYL CITRATE (PF) 100 MCG/2ML IJ SOLN
INTRAMUSCULAR | Status: DC | PRN
  Administered 2020-10-14: 50 ug via INTRAVENOUS
  Administered 2020-10-14 (×6): 25 ug via INTRAVENOUS

## 2020-10-14 MED ORDER — MIDAZOLAM HCL 2 MG/2ML IJ SOLN
INTRAMUSCULAR | Status: AC
  Filled 2020-10-14: qty 2

## 2020-10-14 MED ORDER — CHLORHEXIDINE GLUCONATE 0.12 % MT SOLN
15.0000 mL | Freq: Once | OROMUCOSAL | Status: AC
  Administered 2020-10-14: 07:00:00 15 mL via OROMUCOSAL

## 2020-10-14 MED ORDER — BUPIVACAINE-EPINEPHRINE 0.25% -1:200000 IJ SOLN
INTRAMUSCULAR | Status: DC | PRN
  Administered 2020-10-14: 10 mL

## 2020-10-14 MED ORDER — MIDAZOLAM HCL 2 MG/2ML IJ SOLN
INTRAMUSCULAR | Status: DC | PRN
  Administered 2020-10-14 (×2): 1 mg via INTRAVENOUS

## 2020-10-14 MED ORDER — OXYCODONE HCL 5 MG PO TABS: 5.0000 mg | ORAL_TABLET | ORAL | 0 refills | Status: DC | PRN

## 2020-10-14 MED ORDER — BUPIVACAINE-EPINEPHRINE (PF) 0.25% -1:200000 IJ SOLN
INTRAMUSCULAR | Status: AC
  Filled 2020-10-14: qty 30

## 2020-10-14 MED ORDER — ONDANSETRON HCL 4 MG/2ML IJ SOLN
INTRAMUSCULAR | Status: DC | PRN
  Administered 2020-10-14: 4 mg via INTRAVENOUS

## 2020-10-14 SURGICAL SUPPLY — 53 items
ADH SKN CLS APL DERMABOND .7 (GAUZE/BANDAGES/DRESSINGS) ×1
APL PRP STRL LF DISP 70% ISPRP (MISCELLANEOUS) ×1
BINDER BREAST LRG (GAUZE/BANDAGES/DRESSINGS) ×2 IMPLANT
BLADE SURG 15 STRL LF DISP TIS (BLADE) ×3 IMPLANT
BLADE SURG 15 STRL SS (BLADE) ×9
BNDG COHESIVE 4X5 TAN STRL (GAUZE/BANDAGES/DRESSINGS) ×3 IMPLANT
CHLORAPREP W/TINT 26 (MISCELLANEOUS) ×3 IMPLANT
CLIP VESOCCLUDE MED 6/CT (CLIP) ×9 IMPLANT
CLIP VESOCCLUDE SM WIDE 6/CT (CLIP) ×6 IMPLANT
CLOSURE STERI-STRIP 1/2X4 (GAUZE/BANDAGES/DRESSINGS) ×1
CLOSURE WOUND 1/2 X4 (GAUZE/BANDAGES/DRESSINGS)
CLSR STERI-STRIP ANTIMIC 1/2X4 (GAUZE/BANDAGES/DRESSINGS) ×1 IMPLANT
COVER MAYO STAND STRL (DRAPES) ×3 IMPLANT
COVER SURGICAL LIGHT HANDLE (MISCELLANEOUS) ×3 IMPLANT
COVER WAND RF STERILE (DRAPES) IMPLANT
DECANTER SPIKE VIAL GLASS SM (MISCELLANEOUS) ×3 IMPLANT
DERMABOND ADVANCED (GAUZE/BANDAGES/DRESSINGS) ×2
DERMABOND ADVANCED .7 DNX12 (GAUZE/BANDAGES/DRESSINGS) ×1 IMPLANT
DRAIN CHANNEL 19F RND (DRAIN) ×2 IMPLANT
DRAPE UTILITY XL STRL (DRAPES) ×3 IMPLANT
DRSG PAD ABDOMINAL 8X10 ST (GAUZE/BANDAGES/DRESSINGS) ×2 IMPLANT
DRSG TEGADERM 4X4.75 (GAUZE/BANDAGES/DRESSINGS) ×2 IMPLANT
ELECT REM PT RETURN 15FT ADLT (MISCELLANEOUS) ×3 IMPLANT
EVACUATOR DRAINAGE 10X20 100CC (DRAIN) IMPLANT
EVACUATOR SILICONE 100CC (DRAIN)
GAUZE 4X4 16PLY RFD (DISPOSABLE) ×3 IMPLANT
GAUZE SPONGE 4X4 12PLY STRL (GAUZE/BANDAGES/DRESSINGS) ×3 IMPLANT
GLOVE BIO SURGEON STRL SZ 6 (GLOVE) ×3 IMPLANT
GLOVE BIOGEL PI IND STRL 7.0 (GLOVE) ×1 IMPLANT
GLOVE BIOGEL PI INDICATOR 7.0 (GLOVE) ×2
GLOVE INDICATOR 6.5 STRL GRN (GLOVE) ×3 IMPLANT
GOWN SPEC L3 XXLG W/TWL (GOWN DISPOSABLE) ×3 IMPLANT
ILLUMINATOR WAVEGUIDE N/F (MISCELLANEOUS) ×2 IMPLANT
KIT BASIN OR (CUSTOM PROCEDURE TRAY) ×3 IMPLANT
KIT TURNOVER KIT A (KITS) IMPLANT
LIGHT WAVEGUIDE WIDE FLAT (MISCELLANEOUS) IMPLANT
MARKER SKIN DUAL TIP RULER LAB (MISCELLANEOUS) ×3 IMPLANT
NDL HYPO 25X1 1.5 SAFETY (NEEDLE) ×1 IMPLANT
NEEDLE HYPO 22GX1.5 SAFETY (NEEDLE) ×3 IMPLANT
NEEDLE HYPO 25X1 1.5 SAFETY (NEEDLE) ×3 IMPLANT
PACK UNIVERSAL I (CUSTOM PROCEDURE TRAY) ×3 IMPLANT
PENCIL SMOKE EVACUATOR (MISCELLANEOUS) ×2 IMPLANT
SPONGE LAP 18X18 RF (DISPOSABLE) ×3 IMPLANT
STOCKINETTE 6  STRL (DRAPES) ×3
STOCKINETTE 6 STRL (DRAPES) ×1 IMPLANT
STRIP CLOSURE SKIN 1/2X4 (GAUZE/BANDAGES/DRESSINGS) IMPLANT
SUT ETHILON 2 0 PS N (SUTURE) IMPLANT
SUT MNCRL AB 4-0 PS2 18 (SUTURE) ×3 IMPLANT
SUT VIC AB 3-0 SH 27 (SUTURE) ×9
SUT VIC AB 3-0 SH 27X BRD (SUTURE) ×3 IMPLANT
SYR BULB IRRIG 60ML STRL (SYRINGE) ×3 IMPLANT
SYR CONTROL 10ML LL (SYRINGE) ×3 IMPLANT
TOWEL OR 17X26 10 PK STRL BLUE (TOWEL DISPOSABLE) ×3 IMPLANT

## 2020-10-14 NOTE — Interval H&P Note (Signed)
History and Physical Interval Note:  10/14/2020 7:28 AM  Victoria Coffey  has presented today for surgery, with the diagnosis of RIGHT BREAST CANCER.  The various methods of treatment have been discussed with the patient and family. After consideration of risks, benefits and other options for treatment, the patient has consented to  Procedure(s) with comments: RIGHT AXILLARY LYMPH NODE DISSECTION (Right) -  as a surgical intervention.  The patient's history has been reviewed, patient examined, no change in status, stable for surgery.  I have reviewed the patient's chart and labs.  Questions were answered to the patient's satisfaction.     [name]

## 2020-10-14 NOTE — Discharge Instructions (Signed)
[facility] [place] °[phone] ° °MASTECTOMY: POST OP INSTRUCTIONS ° °Always review your discharge instruction sheet given to you by the facility where your surgery was performed. °IF YOU HAVE DISABILITY OR FAMILY LEAVE FORMS, YOU MUST BRING THEM TO THE OFFICE FOR PROCESSING.   °DO NOT GIVE THEM TO YOUR DOCTOR. °A prescription for pain medication may be given to you upon discharge.  Take your pain medication as prescribed, if needed.  If narcotic pain medicine is not needed, then you may take acetaminophen (Tylenol) or ibuprofen (Advil) as needed. °1. Take your usually prescribed medications unless otherwise directed. °2. If you need a refill on your pain medication, please contact your pharmacy.  They will contact our office to request authorization.  Prescriptions will not be filled after 5pm or on week-ends. °3. You should follow a light diet the first few days after arrival home, such as soup and crackers, etc.  Resume your normal diet the day after surgery. °4. Most patients will experience some swelling and bruising on the chest and underarm.  Ice packs will help.  Swelling and bruising can take several days to resolve.  °5. It is common to experience some constipation if taking pain medication after surgery.  Increasing fluid intake and taking a stool softener (such as Colace) will usually help or prevent this problem from occurring.  A mild laxative (Milk of Magnesia or Miralax) should be taken according to package instructions if there are no bowel movements after 48 hours. °6. Unless discharge instructions indicate otherwise, leave your bandage dry and in place until your next appointment in 3-5 days.  You may take a limited sponge bath.  No tube baths or showers until the drains are removed.  You may have steri-strips (small skin tapes) in place directly over the incision.  These strips should be left on the skin for 7-10 days.  If your surgeon used skin glue on the incision, you may  shower in 24 hours.  The glue will flake off over the next 2-3 weeks.  Any sutures or staples will be removed at the office during your follow-up visit. °7. DRAINS:  If you have drains in place, it is important to keep a list of the amount of drainage produced each day in your drains.  Before leaving the hospital, you should be instructed on drain care.  Call our office if you have any questions about your drains. °8. ACTIVITIES:  You may resume regular (light) daily activities beginning the next day--such as daily self-care, walking, climbing stairs--gradually increasing activities as tolerated.  You may have sexual intercourse when it is comfortable.  Refrain from any heavy lifting or straining until approved by your doctor. °a. You may drive when you are no longer taking prescription pain medication, you can comfortably wear a seatbelt, and you can safely maneuver your car and apply brakes. °b. RETURN TO WORK:  __________________________________________________________ °9. You should see your doctor in the office for a follow-up appointment approximately 3-5 days after your surgery.  Your doctor’s nurse will typically make your follow-up appointment when she calls you with your pathology report.  Expect your pathology report 2-3 business days after your surgery.  You may call to check if you do not hear from us after three days.   °10. OTHER INSTRUCTIONS: ______________________________________________________________________________________________ ____________________________________________________________________________________________ ° °WHEN TO CALL YOUR DOCTOR: °1. Fever over 101.0 °2. Nausea and/or vomiting °3. Extreme swelling or bruising °4. Continued bleeding from incision. °5. Increased pain, redness, or drainage from the   The clinic staff is available to answer your questions during regular business hours.  Please don't hesitate to call and ask to speak to one of the nurses for clinical  concerns.  If you have a medical emergency, go to the nearest emergency room or call 911.  A surgeon from [facility] is always on call at the hospital. [address] [place], [place], [place]  [address] ? [address], [place], [place]   [address] [phone] ? [phone] ? FAX [phone]     [facility] drains are used to remove extra fluid that normally builds up in a surgical wound after surgery. A surgical drain helps to heal a surgical wound. Different kinds of surgical drains include:  Active drains. These drains use suction to pull drainage away from the surgical wound. Drainage flows through a tube to a container outside of the body. With these drains, you need to keep the bulb or the drainage container flat (compressed) at all times, except while you empty it. Flattening the bulb or container creates suction.  Passive drains. These drains allow fluid to drain naturally, by gravity. Drainage flows through a tube to a bandage (dressing) or a container outside of the body. Passive drains do not need to be emptied. A drain is placed during surgery. Right after surgery, drainage is usually bright red and a little thicker than water. The drainage may gradually turn yellow or pink and become thinner. It is likely that your health care provider will remove the drain when the drainage stops or when the amount decreases to 1-2 Tbsp (15-30 mL) during a 24-hour period. Supplies needed:  Tape.  Germ-free cleaning solution (sterile saline).  Cotton swabs.  Split gauze drain sponge: 4 x 4 inches (10 x 10 cm).  Gauze square: 4 x 4 inches (10 x 10 cm). How to care for your surgical drain Care for your drain as told by your health care provider. This is important to help prevent infection. If your drain is placed at your back, or any other hard-to-reach area, ask another person to assist you in performing the following tasks: General care  Keep  the skin around the drain dry and covered with a dressing at all times.  Check your drain area every day for signs of infection. Check for: ? Redness, swelling, or pain. ? Pus or a bad smell. ? Cloudy drainage. ? Tenderness or pressure at the drain exit site. Changing the dressing Follow instructions from your health care provider about how to change your dressing. Change your dressing at least once a day. Change it more often if needed to keep the dressing dry. Make sure you: 1. Gather your supplies. 2. Wash your hands with soap and water before you change your dressing. If soap and water are not available, use hand sanitizer. 3. Remove the old dressing. Avoid using scissors to do that. 4. Wash your hands with soap and water again after removing the old dressing. 5. Use sterile saline to clean your skin around the drain. You may need to use a cotton swab to clean the skin. 6. Place the tube through the slit in a drain sponge. Place the drain sponge so that it covers your wound. 7. Place the gauze square or another drain sponge on top of the drain sponge that is on the wound. Make sure the tube is between those layers. 8. Tape the dressing to your skin. 9. Tape the drainage tube to  your skin 1-2 inches (2.5-5 cm) below the place where the tube enters your body. Taping keeps the tube from pulling on any stitches (sutures) that you have. 10. Wash your hands with soap and water. 11. Write down the color of your drainage and how often you change your dressing. How to empty your active drain  1. Make sure that you have a measuring cup that you can empty your drainage into. 2. Wash your hands with soap and water. If soap and water are not available, use hand sanitizer. 3. Loosen any pins or clips that hold the tube in place. 4. If your health care provider tells you to strip the tube to prevent clots and tube blockages: ? Hold the tube at the skin with one hand. Use your other hand to pinch the  tubing with your thumb and first finger. ? Gently move your fingers down the tube while squeezing very lightly. This clears any drainage, clots, or tissue from the tube. ? You may need to do this several times each day to keep the tube clear. Do not pull on the tube. 5. Open the bulb cap or the drain plug. Do not touch the inside of the cap or the bottom of the plug. 6. Turn the device upside down and gently squeeze. 7. Empty all of the drainage into the measuring cup. 8. Compress the bulb or the container and replace the cap or the plug. To compress the bulb or the container, squeeze it firmly in the middle while you close the cap or plug the container. 9. Write down the amount of drainage that you have in each 24-hour period. If you have less than 2 Tbsp (30 mL) of drainage during 24 hours, contact your health care provider. 10. Flush the drainage down the toilet. 11. Wash your hands with soap and water. Contact a health care provider if:  You have redness, swelling, or pain around your drain area.  You have pus or a bad smell coming from your drain area.  You have a fever or chills.  The skin around your drain is warm to the touch.  The amount of drainage that you have is increasing instead of decreasing.  You have drainage that is cloudy.  There is a sudden stop or a sudden decrease in the amount of drainage that you have.  Your drain tube falls out.  Your active drain does not stay compressed after you empty it. Summary  Surgical drains are used to remove extra fluid that normally builds up in a surgical wound after surgery.  Different kinds of surgical drains include active drains and passive drains. Active drains use suction to pull drainage away from the surgical wound, and passive drains allow fluid to drain naturally.  It is important to care for your drain to prevent infection. If your drain is placed at your back, or any other hard-to-reach area, ask another person to  assist you.  Contact your health care provider if you have redness, swelling, or pain around your drain area. This information is not intended to replace advice given to you by your health care provider. Make sure you discuss any questions you have with your health care provider. Document Revised: 11/23/2018 Document Reviewed: 11/23/2018 Elsevier Patient Education  2020 [employer].

## 2020-10-14 NOTE — Anesthesia Procedure Notes (Signed)
Procedure Name: LMA Insertion Date/Time: 10/14/2020 7:49 AM Performed by: Eben Burow, CRNA Pre-anesthesia Checklist: Patient identified, Emergency Drugs available, Suction available, Patient being monitored and Timeout performed Patient Re-evaluated:Patient Re-evaluated prior to induction Oxygen Delivery Method: Circle system utilized Preoxygenation: Pre-oxygenation with 100% oxygen Induction Type: IV induction Ventilation: Mask ventilation without difficulty LMA: LMA inserted LMA Size: 4.0 Number of attempts: 1 Tube secured with: Tape Dental Injury: Teeth and Oropharynx as per pre-operative assessment

## 2020-10-14 NOTE — Anesthesia Preprocedure Evaluation (Signed)
Anesthesia Evaluation  Patient identified by MRN, date of birth, ID band Patient awake    Reviewed: Allergy & Precautions, H&P , NPO status , Patient's Chart, lab work & pertinent test results  Airway Mallampati: II   Neck ROM: full    Dental   Pulmonary neg pulmonary ROS,    breath sounds clear to auscultation       Cardiovascular hypertension,  Rhythm:regular Rate:Normal     Neuro/Psych    GI/Hepatic   Endo/Other    Renal/GU      Musculoskeletal   Abdominal   Peds  Hematology  (+) Blood dyscrasia, anemia , HIV,   Anesthesia Other Findings   Reproductive/Obstetrics Breast CA s/p mastectomy                             Anesthesia Physical Anesthesia Plan  ASA: II  Anesthesia Plan: General   Post-op Pain Management:  Regional for Post-op pain   Induction: Intravenous  PONV Risk Score and Plan: 3 and Ondansetron, Dexamethasone, Midazolam and Treatment may vary due to age or medical condition  Airway Management Planned: LMA  Additional Equipment:   Intra-op Plan:   Post-operative Plan: Extubation in OR  Informed Consent: I have reviewed the patients History and Physical, chart, labs and discussed the procedure including the risks, benefits and alternatives for the proposed anesthesia with the patient or authorized representative who has indicated his/her understanding and acceptance.       Plan Discussed with: CRNA, Anesthesiologist and Surgeon  Anesthesia Plan Comments:         Anesthesia Quick Evaluation

## 2020-10-14 NOTE — Transfer of Care (Signed)
Immediate Anesthesia Transfer of Care Note  Patient: Victoria Coffey  Procedure(s) Performed: left axilla removal of two drains right axillary lymph node dissection (Right Breast)  Patient Location: PACU  Anesthesia Type:General  Level of Consciousness: awake, alert  and oriented  Airway & Oxygen Therapy: Patient Spontanous Breathing and Patient connected to face mask oxygen  Post-op Assessment: Report given to RN and Post -op Vital signs reviewed and stable  Post vital signs: Reviewed and stable  Last Vitals:  Vitals Value Taken Time  BP    Temp    Pulse 69 10/14/20 0946  Resp 15 10/14/20 0946  SpO2 100 % 10/14/20 0946  Vitals shown include unvalidated device data.  Last Pain:  Vitals:   10/14/20 0649  TempSrc:   PainSc: 0-No pain      Patients Stated Pain Goal: 3 (97/84/78 4128)  Complications: No complications documented.

## 2020-10-14 NOTE — Op Note (Signed)
Right axillary lymph node dissection  Indications: This patient presents with history of bilateral breast cancer, s/p left MRM and right mastectomy with targeted node and SLN bx with 3/6 positive right nodes  Pre-operative Diagnosis: right breast cancer,  [id number],  weak +/-/-, UOQ  Post-operative Diagnosis: same  Surgeon: [name]   Anesthesia: General endotracheal anesthesia and pectoral block  ASA Class: 2  Procedure Details  The patient was seen in the Holding Room. The risks, benefits, complications, treatment options, and expected outcomes were discussed with the patient. The possibilities of reaction to medication, pulmonary aspiration, bleeding, infection, the need for additional procedures, failure to diagnose a condition, and creating a complication requiring transfusion or operation were discussed with the patient. The patient concurred with the proposed plan, giving informed consent.  The site of surgery properly noted/marked. The patient was taken to Operating Room # 11, identified as Antoinett Dorman and the procedure verified as right axillary lymph node dissection and removal of drains 1 and 2.   A Time Out was held and the above information confirmed.    After induction of anesthesia, the right arm, breast, and chest were prepped and draped in standard fashion.    The lateral portion of the mastectomy incision was reopened and extended laterally around 2 cm.   An axillary dissection was performed with removal of the associated lymph nodes and surrounding adipose tissue. This included levels I and II. This was accomplished by exposing the axillary vein anteriorly and inferiorly to the level of the pectoralis minor and laterally over the latissimus dorsi muscle. Posteriorly, the dissection continued to the subscapularis.  Small venous tributaries, lymphatics, and vessels were clipped and ligated or cauterized and divided. The subscapularis muscle was skeletonized. The long thoracic  and thoracodorsal neurovascular bundles were identified and preserved.  The wound was irrigated and closed with a 3-0 Vicryl deep dermal interrupted and a 4-0 vicryl subcuticular closure in layers.  The wound was irrigated. One 19 Blake drain was placed laterally.   Hemostasis was achieved with cautery.  The skin was then closed with a 3-0 Vicryl deep dermal interrupted sutures and 4-0 Vicryl subcuticular closure in layers.    Sterile dressings were applied. At the end of the operation, all sponge, instrument, and needle counts were correct.  Findings: grossly clear surgical margins  Estimated Blood Loss: <50 mL          Drains: 19 Fr blake drain in right axilla                Specimens: right axillary contents         Complications:  None; patient tolerated the procedure well.         Disposition: PACU - hemodynamically stable.         Condition: stable

## 2020-10-15 ENCOUNTER — Other Ambulatory Visit: Payer: Self-pay | Admitting: Hematology

## 2020-10-15 ENCOUNTER — Encounter (HOSPITAL_COMMUNITY): Payer: Self-pay | Admitting: General Surgery

## 2020-10-15 LAB — SURGICAL PATHOLOGY

## 2020-10-15 MED ORDER — CAPECITABINE 500 MG PO TABS
1000.0000 mg/m2 | ORAL_TABLET | Freq: Two times a day (BID) | ORAL | 1 refills | Status: DC
Start: 2020-10-15 — End: 2020-11-29

## 2020-10-15 NOTE — Telephone Encounter (Signed)
Oral Chemotherapy Pharmacist Encounter  I spoke with patient for overview of: Xeloda (capecitabine) for the adjuvant treatment of triple negative breast cancer, planned duration ~6-8 cycles. Patient additionally will continue pembrolizumab to complete 1 year of treatment.   Counseled patient on administration, dosing, side effects, monitoring, drug-food interactions, safe handling, storage, and disposal.  Patient will take Xeloda 500mg  tablets, 3 tablets (1500mg ) by mouth in AM and 3 tabs (1500mg ) by mouth in PM, within 30 minutes of finishing meals, for 14 days on, 7 days off, repeated every 21 days.  Xeloda start date: Patient knows not to start until instructed by MD at next office visit on 10/24/20  Adverse effects include but are not limited to: fatigue, decreased blood counts, GI upset, diarrhea, mouth sores, and hand-foot syndrome.  Patient will obtain anti diarrheal and alert the office of 4 or more loose stools above baseline.  Reviewed with patient importance of keeping a medication schedule and plan for any missed doses. No barriers to medication adherence identified.  Medication reconciliation performed and medication/allergy list updated.  Patient will pick up Xeloda from [facility] on 10/24/20. Patient has $0 copay for 1st fill of Xeloda  Patient informed the pharmacy will reach out 5-7 days prior to needing next fill of Xeloda to coordinate continued medication acquisition to prevent break in therapy.  All questions answered.  Ms. Zurcher voiced understanding and appreciation.   Medication education handout placed in mail for patient. Patient knows to call the office with questions or concerns. Oral Chemotherapy Clinic phone number provided to patient.   Leron Croak, PharmD, BCPS Hematology/Oncology Clinical Pharmacist [facility] [phone] 10/15/2020 2:05 PM

## 2020-10-16 ENCOUNTER — Telehealth: Payer: Self-pay | Admitting: Hematology

## 2020-10-16 NOTE — Telephone Encounter (Signed)
Scheduled appts per 12/10 los. Pt confirmed appt date and time.  

## 2020-10-16 NOTE — Anesthesia Postprocedure Evaluation (Signed)
Anesthesia Post Note  Patient: Victoria Coffey  Procedure(s) Performed: left axilla removal of two drains right axillary lymph node dissection (Right Breast)     Patient location during evaluation: PACU Anesthesia Type: General Level of consciousness: awake and alert Pain management: pain level controlled Vital Signs Assessment: post-procedure vital signs reviewed and stable Respiratory status: spontaneous breathing, nonlabored ventilation, respiratory function stable and patient connected to nasal cannula oxygen Cardiovascular status: blood pressure returned to baseline and stable Postop Assessment: no apparent nausea or vomiting Anesthetic complications: no   No complications documented.  Last Vitals:  Vitals:   10/14/20 1030 10/14/20 1130  BP: (!) 149/75 (!) 150/79  Pulse: 76 80  Resp: 13 14  Temp: 36.6 C 36.6 C  SpO2: 99% 100%    Last Pain:  Vitals:   10/14/20 1130  TempSrc:   PainSc: 0-No pain   Pain Goal: Patients Stated Pain Goal: 3 (10/10/20 1014)                 [place]

## 2020-10-18 ENCOUNTER — Encounter: Payer: Self-pay | Admitting: *Deleted

## 2020-10-21 ENCOUNTER — Other Ambulatory Visit: Payer: Self-pay

## 2020-10-21 ENCOUNTER — Inpatient Hospital Stay: Payer: BC Managed Care – PPO

## 2020-10-21 DIAGNOSIS — Z23 Encounter for immunization: Secondary | ICD-10-CM

## 2020-10-21 DIAGNOSIS — Z5112 Encounter for antineoplastic immunotherapy: Secondary | ICD-10-CM | POA: Diagnosis not present

## 2020-10-21 NOTE — Progress Notes (Signed)
   Covid-19 Vaccination Clinic  Name:  Victoria Coffey    MRN: [id number] DOB: [birth year]  10/21/2020  [name] was observed post Covid-19 immunization for 15 minutes without incident. She was provided with Vaccine Information Sheet and instruction to access the V-Safe system.   [name] was instructed to call 911 with any severe reactions post vaccine: Marland Kitchen Difficulty breathing  . Swelling of face and throat  . A fast heartbeat  . A bad rash all over body  . Dizziness and weakness   Immunizations Administered    Name Date Dose VIS Date Route   Pfizer COVID-19 Vaccine 10/21/2020  9:39 AM 0.3 mL 08/21/2020 Intramuscular   Manufacturer: [employer]   Lot: [id number]   [place]: [id number]

## 2020-10-23 NOTE — Progress Notes (Signed)
[place]   [phone] [phone]   Clinic Follow up Note   Patient Care Team: [name], MD as PCP - General (Infectious Diseases) [name], MD as PCP - Infectious Diseases (Infectious Diseases) [name], MD as Consulting Physician (General Surgery) [name], MD as Consulting Physician (Hematology) [name], MD as Consulting Physician (Radiation Oncology) [name], RN as Oncology Nurse Navigator [name], RN as Oncology Nurse Navigator 10/24/2020  CHIEF COMPLAINT: Follow up bilateral breast cancer   SUMMARY OF ONCOLOGIC HISTORY: Oncology History Overview Note  Cancer Staging Bilateral breast cancer [facility]) Staging form: Breast, AJCC 8th Edition - Pathologic stage from 10/01/2020: Stage IIIC (pT3, pN2a, cM0, G3, ER-, PR-, HER2-) - Signed by Alla Feeling, NP on 10/24/2020  Malignant neoplasm of upper-outer quadrant of left breast in female, estrogen receptor positive ([place]) Staging form: Breast, AJCC 8th Edition - Clinical stage from 03/01/2020: Stage IIIC (cT4, cN0, cM0, G3, ER+, PR-, HER2-) - Signed by [name], MD on 08/22/2020 - Pathologic stage from 10/01/2020: Stage IIIC (pT4b, pN0, cM0, G3, ER-, PR-, HER2-) - Signed by Alla Feeling, NP on 10/24/2020    Malignant neoplasm of upper-outer quadrant of left breast in female, estrogen receptor positive ([place])  02/28/2020 Mammogram   Diagnostic Mammogram 02/28/20  IMPRESSION The 3.9 cm ill defined mass in the left breast posterior depth central 4.3cm to the nipple seen on the mediolateral oblique view onlt with associated difssue skin thickening is highly suspicious for malignancy.    03/01/2020 Cancer Staging   Staging form: Breast, AJCC 8th Edition - Clinical stage from 03/01/2020: Stage IIIC (cT4, cN0, cM0, G3, ER+, PR-, HER2-) - Signed by [name], MD on 08/22/2020   03/01/2020 Initial Biopsy   Diagnosis 03/01/20  1. Breast, left, needle core biopsy,  9 o'clock, 3-4cmfn - INVASIVE DUCTAL CARCINOMA. SEE NOTE 2. Lymph node, needle/core biopsy, right axilla - INVASIVE DUCTAL CARCINOMA. SEE NOTE Diagnosis Note 1. Invasive carcinoma measures 1.5 cm in greatest linear dimension and appears grade 3. Dr. [name] reviewed the case and concurs with the diagnosis. A breast prognostic profile (ER, PR, Ki-67 and HER2) is pending and will be reported in an addendum. Dr. [name] was notified on 03/04/2020. 2. Carcinoma measures 0.6 cm in greatest linear dimension and appears grade 3. Lymphoid tissue is not identified - this may represent a completely replaced lymph node. A breast prognostic profile (ER, PR, Ki-67 and HER2) is pending and will be reported in an addendum. Dr. [name] reviewed the case and concurs with the diagnosis. Dr. [name] was notified on 03/04/2020.   03/01/2020 Receptors her2   1. PROGNOSTIC INDICATORS Results: IMMUNOHISTOCHEMICAL AND MORPHOMETRIC ANALYSIS PERFORMED MANUALLY The tumor cells are NEGATIVE for Her2 (0). Estrogen Receptor: 20%, POSITIVE, WEAK STAINING INTENSITY Progesterone Receptor: 0%, NEGATIVE Proliferation Marker Ki67: 40% COMMENT: The negative hormone receptor study(ies) in this case has an internal positive control.    2. PROGNOSTIC INDICATORS Results: IMMUNOHISTOCHEMICAL AND MORPHOMETRIC ANALYSIS PERFORMED MANUALLY The tumor cells are NEGATIVE for Her2 (1+). Estrogen Receptor: 10%, POSITIVE, WEAK STAINING INTENSITY Progesterone Receptor: 0%, NEGATIVE Proliferation Marker Ki67: 40% COMMENT: The negative hormone receptor study(ies) in this case has no internal positive control.   03/05/2020 Initial Diagnosis   Malignant neoplasm of upper-outer quadrant of left breast in female, estrogen receptor positive ([place])   03/18/2020 Breast MRI   IMPRESSION: 1. 7 x 9 x 6 cm area of suspicious non masslike enhancement within the central/UPPER RIGHT breast with anterior  skin thickening. Given biopsy-proven metastatic  RIGHT axillary lymph node, tissue sampling of the anterior and posterior aspects of this non masslike RIGHT breast enhancement is recommended to exclude malignancy. 2. 7 x 6 x 5 cm diffuse masslike and nonmasslike enhancement within the majority of the remaining LEFT breast with diffuse LEFT breast skin thickening, compatible with malignancy. Abnormal enhancement is noted along the anterior aspect of the LEFT pectoralis muscle. 3. Three abnormal RIGHT axillary lymph nodes, 1 which is biopsy proven to be metastatic disease. No abnormal LEFT axillary lymph nodes identified.   03/20/2020 Echocardiogram   Baseline ECHO  IMPRESSIONS     1. The patient was reported to be in pain during the study and not able  to participate in this study. All that can be said is that the LV and RV  function are grossly normal. Would recommend to repeat this study when/if  the patient is able to tolerate  the exam.   2. Left ventricular ejection fraction, by estimation, is 60 to 65%. The  left ventricle has normal function. Left ventricular endocardial border  not optimally defined to evaluate regional wall motion. Left ventricular  diastolic function could not be  evaluated.   3. Right ventricular systolic function is normal. The right ventricular  size is normal.   4. The mitral valve was not well visualized. No evidence of mitral valve  regurgitation.   5. The aortic valve was not well visualized. Aortic valve regurgitation  is not visualized.   6. The inferior vena cava is normal in size with greater than 50%  respiratory variability, suggesting right atrial pressure of 3 mmHg.    03/20/2020 Imaging   CT CAP W contrast  IMPRESSION: 1. Small right axillary/subpectoral lymph nodes. Difficult to exclude metastatic disease. 2. Subtle 6 mm low-attenuation lesion in the dome of the liver, not well seen previously. Lesion is too small to characterize. Further evaluation could be performed with MR  abdomen without and with contrast, as clinically indicated. 3. Hepatic steatosis. 4. Aortic atherosclerosis (ICD10-I70.0). Coronary artery calcification.     03/20/2020 Imaging   Whole body bone scan  IMPRESSION: 1. Increased activity noted over the right breast, possibly related to the patient's right breast cancer.   2. Punctate area of increased activity over the left maxillary region, most likely related to sinus disease or dental disease. Increased activity noted over the left mandible also possibly related to dental disease. No other focal bony abnormalities to suggest metastatic disease identified.   03/22/2020 Procedure   PAC placement by Dr [name]    03/22/2020 - 08/23/2020 Chemotherapy   AC q2weeks for 4 cycles 03/22/20-05/03/20 followed by weekly Taxol and Carboplatin q3weeks for 12 weeks starting 05/17/20.                  Week 2 Taxol reduced to half dose and CT was dose reduced starting with week 4 due to thrombocytopenia. Given pancytopenia, will change Carboplatin to 50% dose weekly with Taxol 60m/m2 starting with week 7 (06/13/20). Will hold Carboplatin as needed based on labs. Carboplatin increased to 80% starting with week 8. Carboplatin dose reduced for C11 and held with C12. C12 Taxol postponed to 08/23/20   03/22/2020 Pathology Results   FINAL MICROSCOPIC DIAGNOSIS:   A. BREAST, RIGHT, PUNCH BIOPSY:  - Carcinoma involving dermal lymphatics.  - See comment.   B. BREAST, LEFT, PUNCH BIOPSY:  - Carcinoma involving dermal lymphatics.  - See comment.   COMMENT:  The morphology is  compatible with ductal breast carcinoma.    ADDENDUM:   PROGNOSTIC INDICATOR RESULTS:   Immunohistochemical and morphometric analysis performed manually   The tumor cells are NEGATIVE for Her2 (0%).   Estrogen Receptor:       NEGATIVE, 0%  Progesterone Receptor:   NEGATIVE, 0%    08/13/2020 Breast MRI   IMPRESSION: 1. Interval significant response to treatment of the  diffuse non-mass enhancement involving the UPPER RIGHT breast. There is a solitary residual 5 mm indeterminate mass in the UPPER OUTER QUADRANT at MIDDLE depth with plateau kinetics. 2. Minimal response to treatment of the diffuse non-mass enhancement throughout the LEFT breast. 3. Resolution of enhancement of the skin and nipple-areolar complex of the RIGHT breast. 4. Near complete resolution of enhancement of the skin and nipple-areolar complex of the LEFT breast. Minimal residual nipple-areola complex enhancement persists. 5. The 3 previously identified pathologic RIGHT axillary lymph nodes are now normal in appearance. There is no pathologic lymphadenopathy currently.   08/23/2020 -  Chemotherapy   Immunotherapy Keytruda q3weeks starting 08/23/20 for 1 year treatment.    10/01/2020 Surgery   MASTECTOMIES WITH RIGHT SENTINEL LYMPH NODE BIOPSIES,RIGHT TARGETED RADIOACTIVE SEED LYMPH NODE, LEFT BREAST MODIFIED RADICAL MASTECTOMY  and RADIOACTIVE SEED GUIDED  TARGETED RIGHT SENTINEL LYMPH NODE BIOPSY by Dr [name]     10/01/2020 Pathology Results   FINAL MICROSCOPIC DIAGNOSIS:   A. BREAST, LEFT, MASTECTOMY:  - Invasive ductal carcinoma, 7 cm.  - Anterior and posterior soft tissue margins not involved by tumor.  - Foci of skin involvement by tumor, see comment.  - Nipple involvement by tumor.  - See oncology table and comment.   B. LYMPH NODE, LEFT AXILLARY, CONTENTS:  - One lymph node with no metastatic carcinoma (0/1).   C. BREAST, RIGHT, MASTECTOMY:  - Invasive ductal carcinoma, greater than 5 cm.  - Margins not involved.  - Subareolar breast tissue involved by tumor.  - See oncology table and comment.   D. LYMPH NODE, RIGHT #2, SENTINEL, EXCISION:  - Metastatic carcinoma in one lymph node, 0.5 cm (1/1).   E. LYMPH NODE, RIGHT #1, SENTINEL, EXCISION:  - Metastatic carcinoma in one lymph node, 1.5 cm (1/1).  - Biopsy site and biopsy clip.   F. LYMPH NODE, RIGHT #3,  SENTINEL, EXCISION:  - Metastatic carcinoma in one lymph node, 1.5 cm (1/1).   G. LYMPH NODE, RIGHT #4, SENTINEL, EXCISION:  - One lymph node with no metastatic carcinoma (0/1).   H. LYMPH NODE, RIGHT #5, SENTINEL, EXCISION:  - Metastatic carcinoma in one lymph node, 0.5 cm (1/1).   I. LYMPH NODE, RIGHT #6, SENTINEL, EXCISION:  - One lymph node with no metastatic carcinoma (0/1).    10/01/2020 Cancer Staging   Staging form: Breast, AJCC 8th Edition - Pathologic stage from 10/01/2020: Stage IIIC (pT4b, pN0, cM0, G3, ER+, PR-, HER2-) - Signed by Alla Feeling, NP on 10/24/2020   10/14/2020 Surgery   RIGHT AXILLARY LYMPH NODE DISSECTION by Dr [name]  -1 /13 positive LN for metastatic carcinoma    Bilateral breast cancer ([place])  10/01/2020 Initial Diagnosis   Bilateral breast cancer ([place])   10/01/2020 Cancer Staging   Staging form: Breast, AJCC 8th Edition - Pathologic stage from 10/01/2020: Stage IIIC (pT3, pN2a, cM0, G3, ER-, PR-, HER2-) - Signed by Alla Feeling, NP on 10/24/2020     CURRENT THERAPY:  1. Keytruda q3weeks starting 08/23/20 for 1 year 2. S/p Right axillary LN Dissection 10/14/20 by Dr. [name]  3. PENDING adjuvant Xeloda 1500 mg BID 2 weeks on/1 week off.   INTERVAL HISTORY: Victoria Coffey returns for follow up and treatment as scheduled. She underwent R ALND on 12/13.  She feels good, pain is minimal at 2 out of 10.  Did not her medication this morning.  Right drainage output is serosanguineous, anywhere from 20-30 cc which she empties 2-3 times per day.  Range of motion is better on the left than right but improving.  He is up and out of bed at home but occasionally lightheaded if she walks too far.  She is able to go up stairs safely, no fall.  Eating and drinking well.  Denies fever or chills.  She will see Dr. [name] on 12/27, has not been referred to PT yet.  She is tolerating Keytruda, no diarrhea, rash, cough, chest pain, dyspnea or neuropathy from previous  chemo.   MEDICAL HISTORY:  Past Medical History:  Diagnosis Date  . Aortic atherosclerosis ([place])   . Breast cancer ([place]) dx'd 1993   left  . Fatty liver   . History of left breast cancer   . HIV infection ([place])   . Hypertension   . Port-A-Cath in place     SURGICAL HISTORY: Past Surgical History:  Procedure Laterality Date  . AXILLARY LYMPH NODE DISSECTION Right 10/14/2020   Procedure: left axilla removal of two drains right axillary lymph node dissection;  Surgeon: [name], MD;  Location: [facility];  Service: General;  Laterality: Right;  PEC BLOCK, RNFA OR PA  . BREAST BIOPSY Bilateral 03/22/2020   Procedure: BILATERAL BREAST PUNCH BIOPSIES;  Surgeon: [name], MD;  Location: [facility];  Service: General;  Laterality: Bilateral;  . BREAST LUMPECTOMY    . CESAREAN SECTION    . left breast lumpectomy  1993  . MASTECTOMY W/ SENTINEL NODE BIOPSY Bilateral 10/01/2020  . MASTECTOMY W/ SENTINEL NODE BIOPSY Bilateral 10/01/2020   Procedure: BILATERAL MASTECTOMIES WITH RIGHT SENTINEL LYMPH NODE BIOPSIES,RIGHT TARGETED RADIOACTIVE SEED LYMPH NODE, LEFT BREAST MODIFIED RADICAL MASTECTOMY ;  Surgeon: [name], MD;  Location: [facility];  Service: General;  Laterality: Bilateral;  PEC BLOCK, RNFA  . PORTACATH PLACEMENT Right 03/22/2020   Procedure: INSERTION PORT-A-CATH WITH ULTRASOUND GUIDANCE;  Surgeon: [name], MD;  Location: [facility];  Service: General;  Laterality: Right;  . RADIOACTIVE SEED GUIDED AXILLARY SENTINEL LYMPH NODE Right 10/01/2020   Procedure: RADIOACTIVE SEED GUIDED  TARGETED RIGHT SENTINEL LYMPH NODE BIOPSY;  Surgeon: [name], MD;  Location: [place];  Service: General;  Laterality: Right;  PEC BLOCK, RNFA    I have reviewed the social history and family history with the patient and they are unchanged from previous note.  ALLERGIES:  is allergic to codeine, other, grapefruit bioflavonoid complex, pomegranate [punica], and shellfish allergy.  MEDICATIONS:   Current Outpatient Medications  Medication Sig Dispense Refill  . acetaminophen (TYLENOL) 500 MG tablet Take 1,000 mg by mouth every 6 (six) hours as needed for moderate pain or headache.     . capecitabine (XELODA) 500 MG tablet Take 3 tablets (1,500 mg total) by mouth 2 (two) times daily after a meal. Take for 14 days on, 7 days off, repeat every 21 days. 84 tablet 1  . emtricitabine-rilpivir-tenofovir AF (ODEFSEY) 200-25-25 MG TABS tablet Take 1 tablet by mouth daily with breakfast. 30 tablet 11  . gabapentin (NEURONTIN) 100 MG capsule Take 1 capsule (100 mg total) by mouth 2 (two) times daily. 60 capsule 0  . hydrochlorothiazide (  HYDRODIURIL) 25 MG tablet Take 1 tablet (25 mg total) by mouth daily. 30 tablet 11  . lidocaine-prilocaine (EMLA) cream Apply 1 application topically See admin instructions. Apply 1-2 hours prior to access    . loratadine (CLARITIN) 10 MG tablet Take 10 mg by mouth See admin instructions. Takes 3-4 days before and after treatment    . methocarbamol (ROBAXIN) 500 MG tablet Take 1 tablet (500 mg total) by mouth every 6 (six) hours as needed for muscle spasms. 20 tablet 0  . oxyCODONE (OXY IR/ROXICODONE) 5 MG immediate release tablet Take 1-2 tablets (5-10 mg total) by mouth every 4 (four) hours as needed for moderate pain, severe pain or breakthrough pain. 10 tablet 0  . potassium chloride SA (KLOR-CON) 20 MEQ tablet Take 1 tablet (20 mEq total) by mouth 2 (two) times daily. Take 1 tab twice daily for 3 days then 1 tab once daily (Patient taking differently: Take 20 mEq by mouth 2 (two) times daily.) 60 tablet 1  . traMADol (ULTRAM) 50 MG tablet Take 1 tablet (50 mg total) by mouth every 6 (six) hours as needed (mild pain). 30 tablet 1   No current facility-administered medications for this visit.   Facility-Administered Medications Ordered in Other Visits  Medication Dose Route Frequency Provider Last Rate Last Admin  . heparin lock flush 100 unit/mL  500 Units  Intracatheter Once PRN [name], MD      . pembrolizumab [facility]) 200 mg in sodium chloride 0.9 % 50 mL chemo infusion  200 mg Intravenous Once [name], MD      . sodium chloride flush (NS) 0.9 % injection 10 mL  10 mL Intracatheter PRN [name], MD        PHYSICAL EXAMINATION: ECOG PERFORMANCE STATUS: 2 - Symptomatic, <50% confined to bed  Vitals:   10/24/20 0811  BP: (!) 150/101  Pulse: 86  Temp: (!) 97.4 F (36.3 C)  SpO2: 100%   Filed Weights   10/24/20 0811  Weight: 130 lb 8 oz (59.2 kg)    GENERAL:alert, no distress and comfortable SKIN: No rash EYES:  sclera clear LYMPH:  no palpable cervical or supraclavicular lymphadenopathy.  LUNGS: clear with normal breathing effort HEART: regular rate & rhythm, no lower extremity edema Musculoskeletal: Decreased upper extremity range of motion right > left.  No lymphedema NEURO: alert & oriented x 3 with fluent speech, no focal motor/sensory deficits PAC without erythema Breast: s/p bilateral mastectomy and R ALND.  Incisions closed with Steri-Strips. 2 Left chest wall drainage tube sites healed.  Right chest wall drainage tube in place with mild serous drainage from the site, moderate serosanguineous in JP drain  LABORATORY DATA:  I have reviewed the data as listed CBC Latest Ref Rng & Units 10/24/2020 10/14/2020 10/11/2020  WBC 4.0 - 10.5 K/uL 4.2 5.7 4.1  Hemoglobin 12.0 - 15.0 g/dL 9.4(L) 8.8(L) 9.3(L)  Hematocrit 36.0 - 46.0 % 28.7(L) 28.1(L) 28.6(L)  Platelets 150 - 400 K/uL 244 272 258     CMP Latest Ref Rng & Units 10/24/2020 10/14/2020 10/11/2020  Glucose 70 - 99 mg/dL 101(H) 99 102(H)  BUN 8 - 23 mg/dL _0 Creatinine 0.44 - 1.00 mg/dL 0.85 1.07(H) 0.90  Sodium 135 - 145 mmol/L 137 142 138  Potassium 3.5 - 5.1 mmol/L 3.4(L) 3.9 3.4(L)  Chloride 98 - 111 mmol/L 105 108 104  CO2 22 - 32 mmol/L _1 Calcium 8.9 - 10.3 mg/dL 9.0 8.7(L) 9.4  Total  Protein 6.5 - 8.1 g/dL 7.0 6.4(L) 6.9  Total  Bilirubin 0.3 - 1.2 mg/dL <0.2(L) 0.3 <0.2(L)  Alkaline Phos 38 - 126 U/L 210(H) 159(H) 198(H)  AST 15 - 41 U/L 36 30 32  ALT 0 - 44 U/L _0 RADIOGRAPHIC STUDIES: I have personally reviewed the radiological images as listed and agreed with the findings in the report. No results found.   ASSESSMENT & PLAN: Victoria Coffey is a 64 y.o. 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  -Given recentpublish Keynote 522 trial data,she started Immunotherapy Keytruda q3weeks for 1 year of treatment beginning 08/23/20.  -S/p b/l mastectomy with Dr [name] on 10/01/20. Her surgical pathology showed 7cm invasive ductal carcinoma of left breast which was completely removed with clear margins and LN negative, and triple negative. Her >5cm invasive ductal carcinoma of right breast was completely removed, clear margins but 4/6 positive LN.  -She underwent right axillary lymph node dissection on 10/14/2020, final path showed 1 of 13 positive LNs with metastatic carcinoma -Due to her residual disease, Dr. Burr Medico has recommended adjuvant Xeloda to reduce her high risk of recurrence after surgery.  Plan to start in 3 weeks when she has recovered -Treatment plan also includes adjuvant radiation to right chest wall and axilla when she has completely healed from surgery -She will continue Keytruda every 3 weeks to complete 1 year treatment.    2.Pancytopenia secondary to chemo -resolved except mild anemia -monitoring   3. H/o Left breast cancer, Dx in 1993s/p lumpectomy, radiation, chemo, Tamoxifen.Genetic Testinghas not been done yet.  4. Comorbidities: HIV(+)Dx 1993, HTN -per ID [name], undetectable and well controlled   Disposition:  Victoria Coffey appears stable. She continues to recover from 10/01/20 bilateral mastectomy and recent 10/14/20 R Ax LN dissection.  Minimal pain, no signs of infection, improving ROM.  She still has moderate drainage output approx 60-90 cc/day depending on activity level.  She will follow up with Dr. [name] on 10/28/2020.   We reviewed her surgical pathology which shows metastatic carcinoma in 1 of 13 lymph nodes.  We reviewed this does not significantly change her stage or trajectory.  She will continue pembrolizumab every 3 weeks to complete 1 year.  Plan to start adjuvant Xeloda in 3 weeks once she has recovered more from surgery.  I will send a message to rad onc.   CBC and CMP reviewed. Will f/up on pending TSH. She will proceed with pembrolizumab today as planned.  Follow-up in 3 weeks with next cycle and to start Xeloda.  Other topics we discussed, she will hold off on shingles vaccine while she is under active treatment.  Continue any injections in the gluteus muscle for now.  Once she recovers from surgery, okay to do manual BP in left lower forearm.   All questions were answered. The patient knows to call the clinic with any problems, questions or concerns. No barriers to learning were detected. Total encounter time was 30 minutes.      Regan Rakers  Darcus Austin, NP 10/24/20

## 2020-10-24 ENCOUNTER — Encounter: Payer: Self-pay | Admitting: Nurse Practitioner

## 2020-10-24 ENCOUNTER — Inpatient Hospital Stay: Payer: BC Managed Care – PPO

## 2020-10-24 ENCOUNTER — Inpatient Hospital Stay (HOSPITAL_BASED_OUTPATIENT_CLINIC_OR_DEPARTMENT_OTHER): Payer: BC Managed Care – PPO | Admitting: Nurse Practitioner

## 2020-10-24 ENCOUNTER — Other Ambulatory Visit: Payer: Self-pay

## 2020-10-24 VITALS — BP 150/101 | HR 86 | Temp 97.4°F | Ht 60.0 in | Wt 130.5 lb

## 2020-10-24 VITALS — BP 148/99

## 2020-10-24 DIAGNOSIS — C50912 Malignant neoplasm of unspecified site of left female breast: Secondary | ICD-10-CM | POA: Diagnosis not present

## 2020-10-24 DIAGNOSIS — C50412 Malignant neoplasm of upper-outer quadrant of left female breast: Secondary | ICD-10-CM

## 2020-10-24 DIAGNOSIS — C50911 Malignant neoplasm of unspecified site of right female breast: Secondary | ICD-10-CM | POA: Diagnosis not present

## 2020-10-24 DIAGNOSIS — Z5112 Encounter for antineoplastic immunotherapy: Secondary | ICD-10-CM | POA: Diagnosis not present

## 2020-10-24 DIAGNOSIS — Z171 Estrogen receptor negative status [ER-]: Secondary | ICD-10-CM | POA: Diagnosis not present

## 2020-10-24 LAB — CMP (CANCER CENTER ONLY)
ALT: 29 U/L (ref 0–44)
AST: 36 U/L (ref 15–41)
Albumin: 3.2 g/dL — ABNORMAL LOW (ref 3.5–5.0)
Alkaline Phosphatase: 210 U/L — ABNORMAL HIGH (ref 38–126)
Anion gap: 9 (ref 5–15)
BUN: 15 mg/dL (ref 8–23)
CO2: 23 mmol/L (ref 22–32)
Calcium: 9 mg/dL (ref 8.9–10.3)
Chloride: 105 mmol/L (ref 98–111)
Creatinine: 0.85 mg/dL (ref 0.44–1.00)
GFR, Estimated: >60 mL/min (ref >60)
Glucose, Bld: 101 mg/dL — ABNORMAL HIGH (ref 70–99)
Potassium: 3.4 mmol/L — ABNORMAL LOW (ref 3.5–5.1)
Sodium: 137 mmol/L (ref 135–145)
Total Bilirubin: <0.2 mg/dL — ABNORMAL LOW (ref 0.3–1.2)
Total Protein: 7 g/dL (ref 6.5–8.1)

## 2020-10-24 LAB — CBC WITH DIFFERENTIAL (CANCER CENTER ONLY)
Abs Immature Granulocytes: 0.01 10*3/uL (ref 0.00–0.07)
Basophils Absolute: 0 10*3/uL (ref 0.0–0.1)
Basophils Relative: 1 %
Eosinophils Absolute: 0.1 10*3/uL (ref 0.0–0.5)
Eosinophils Relative: 3 %
HCT: 28.7 % — ABNORMAL LOW (ref 36.0–46.0)
Hemoglobin: 9.4 g/dL — ABNORMAL LOW (ref 12.0–15.0)
Immature Granulocytes: 0 %
Lymphocytes Relative: 23 %
Lymphs Abs: 1 10*3/uL (ref 0.7–4.0)
MCH: 32.2 pg (ref 26.0–34.0)
MCHC: 32.8 g/dL (ref 30.0–36.0)
MCV: 98.3 fL (ref 80.0–100.0)
Monocytes Absolute: 0.3 10*3/uL (ref 0.1–1.0)
Monocytes Relative: 7 %
Neutro Abs: 2.7 10*3/uL (ref 1.7–7.7)
Neutrophils Relative %: 66 %
Platelet Count: 244 10*3/uL (ref 150–400)
RBC: 2.92 MIL/uL — ABNORMAL LOW (ref 3.87–5.11)
RDW: 13.3 % (ref 11.5–15.5)
WBC Count: 4.2 10*3/uL (ref 4.0–10.5)
nRBC: 0 % (ref 0.0–0.2)

## 2020-10-24 LAB — TSH: TSH: 1.248 u[IU]/mL (ref 0.308–3.960)

## 2020-10-24 MED ORDER — SODIUM CHLORIDE 0.9% FLUSH
10.0000 mL | INTRAVENOUS | Status: DC | PRN
  Administered 2020-10-24: 12:00:00 10 mL
  Filled 2020-10-24: qty 10

## 2020-10-24 MED ORDER — HEPARIN SOD (PORK) LOCK FLUSH 100 UNIT/ML IV SOLN
500.0000 [IU] | Freq: Once | INTRAVENOUS | Status: AC | PRN
  Administered 2020-10-24: 12:00:00 500 [IU]
  Filled 2020-10-24: qty 5

## 2020-10-24 MED ORDER — SODIUM CHLORIDE 0.9 % IV SOLN
Freq: Once | INTRAVENOUS | Status: AC
  Filled 2020-10-24: qty 250

## 2020-10-24 MED ORDER — SODIUM CHLORIDE 0.9 % IV SOLN
200.0000 mg | Freq: Once | INTRAVENOUS | Status: AC
  Administered 2020-10-24: 11:00:00 200 mg via INTRAVENOUS
  Filled 2020-10-24: qty 8

## 2020-10-24 MED FILL — CAPECITABINE 500 MG TABLET: 500 | 21 days supply | Qty: 84 | Fill #0

## 2020-10-24 NOTE — Patient Instructions (Signed)

## 2020-10-24 NOTE — Patient Instructions (Signed)
[facility] Discharge Instructions for Patients Receiving Chemotherapy  Today you received the following chemotherapy agents :  Keytruda.  To help prevent nausea and vomiting after your treatment, we encourage you to take your nausea medication as prescribed.   If you develop nausea and vomiting that is not controlled by your nausea medication, call the clinic.   BELOW ARE SYMPTOMS THAT SHOULD BE REPORTED IMMEDIATELY:  *FEVER GREATER THAN 100.5 F  *CHILLS WITH OR WITHOUT FEVER  NAUSEA AND VOMITING THAT IS NOT CONTROLLED WITH YOUR NAUSEA MEDICATION  *UNUSUAL SHORTNESS OF BREATH  *UNUSUAL BRUISING OR BLEEDING  TENDERNESS IN MOUTH AND THROAT WITH OR WITHOUT PRESENCE OF ULCERS  *URINARY PROBLEMS  *BOWEL PROBLEMS  UNUSUAL RASH Items with * indicate a potential emergency and should be followed up as soon as possible.  Feel free to call the clinic should you have any questions or concerns. The clinic phone number is [phone].  Please show the CHEMO ALERT CARD at check-in to the Emergency Department and triage nurse.  

## 2020-10-24 NOTE — Progress Notes (Signed)
OK to treat with BP per Cira Rue, NP.

## 2020-10-28 ENCOUNTER — Encounter: Payer: BC Managed Care – PPO | Admitting: Physical Therapy

## 2020-10-28 ENCOUNTER — Telehealth: Payer: Self-pay | Admitting: Nurse Practitioner

## 2020-10-28 NOTE — Telephone Encounter (Signed)
Scheduled appointments per 12/23 los. Called patient, no answer. Left message with appointments dates and times.  

## 2020-11-09 ENCOUNTER — Other Ambulatory Visit: Payer: Self-pay | Admitting: Internal Medicine

## 2020-11-11 ENCOUNTER — Ambulatory Visit: Payer: BC Managed Care – PPO | Admitting: Internal Medicine

## 2020-11-13 NOTE — Progress Notes (Signed)
Victoria Coffey   [phone] Fax:([phone]   Clinic Follow up Note   Patient Care Team: [name], MD as PCP - General (Infectious Diseases) [name], MD as PCP - Infectious Diseases (Infectious Diseases) [name], MD as Consulting Physician (General Surgery) [name], MD as Consulting Physician (Hematology) [name], MD as Consulting Physician (Radiation Oncology) [name], RN as Oncology Nurse Navigator [name], RN as Oncology Nurse Navigator  Date of Service:  11/14/2020  CHIEF COMPLAINT: F/u of bilateral breast cancer  SUMMARY OF ONCOLOGIC HISTORY: Oncology History Overview Note  Cancer Staging Bilateral breast cancer [facility]) Staging form: Breast, AJCC 8th Edition - Pathologic stage from 10/01/2020: Stage IIIC (pT3, pN2a, cM0, G3, ER-, PR-, HER2-) - Signed by Alla Feeling, NP on 10/24/2020  Malignant neoplasm of upper-outer quadrant of left breast in female, estrogen receptor positive ([place]) Staging form: Breast, AJCC 8th Edition - Clinical stage from 03/01/2020: Stage IIIC (cT4, cN0, cM0, G3, ER+, PR-, HER2-) - Signed by [name], MD on 08/22/2020 - Pathologic stage from 10/01/2020: Stage IIIC (pT4b, pN0, cM0, G3, ER-, PR-, HER2-) - Signed by Alla Feeling, NP on 10/24/2020    Malignant neoplasm of upper-outer quadrant of left breast in female, estrogen receptor negative ([place])  02/28/2020 Mammogram   Diagnostic Mammogram 02/28/20  IMPRESSION The 3.9 cm ill defined mass in the left breast posterior depth central 4.3cm to the nipple seen on the mediolateral oblique view onlt with associated difssue skin thickening is highly suspicious for malignancy.    03/01/2020 Cancer Staging   Staging form: Breast, AJCC 8th Edition - Clinical stage from 03/01/2020: Stage IIIC (cT4, cN0, cM0, G3, ER+, PR-, HER2-) - Signed by [name], MD on 08/22/2020   03/01/2020 Initial Biopsy   Diagnosis 03/01/20  1. Breast, left,  needle core biopsy, 9 o'clock, 3-4cmfn - INVASIVE DUCTAL CARCINOMA. SEE NOTE 2. Lymph node, needle/core biopsy, right axilla - INVASIVE DUCTAL CARCINOMA. SEE NOTE Diagnosis Note 1. Invasive carcinoma measures 1.5 cm in greatest linear dimension and appears grade 3. Dr. [name] reviewed the case and concurs with the diagnosis. A breast prognostic profile (ER, PR, Ki-67 and HER2) is pending and will be reported in an addendum. Dr. [name] was notified on 03/04/2020. 2. Carcinoma measures 0.6 cm in greatest linear dimension and appears grade 3. Lymphoid tissue is not identified - this may represent a completely replaced lymph node. A breast prognostic profile (ER, PR, Ki-67 and HER2) is pending and will be reported in an addendum. Dr. [name] reviewed the case and concurs with the diagnosis. Dr. [name] was notified on 03/04/2020.   03/01/2020 Receptors her2   1. PROGNOSTIC INDICATORS Results: IMMUNOHISTOCHEMICAL AND MORPHOMETRIC ANALYSIS PERFORMED MANUALLY The tumor cells are NEGATIVE for Her2 (0). Estrogen Receptor: 20%, POSITIVE, WEAK STAINING INTENSITY Progesterone Receptor: 0%, NEGATIVE Proliferation Marker Ki67: 40% COMMENT: The negative hormone receptor study(ies) in this case has an internal positive control.    2. PROGNOSTIC INDICATORS Results: IMMUNOHISTOCHEMICAL AND MORPHOMETRIC ANALYSIS PERFORMED MANUALLY The tumor cells are NEGATIVE for Her2 (1+). Estrogen Receptor: 10%, POSITIVE, WEAK STAINING INTENSITY Progesterone Receptor: 0%, NEGATIVE Proliferation Marker Ki67: 40% COMMENT: The negative hormone receptor study(ies) in this case has no internal positive control.   03/05/2020 Initial Diagnosis   Malignant neoplasm of upper-outer quadrant of left breast in female, estrogen receptor positive ([place])   03/18/2020 Breast MRI   IMPRESSION: 1. 7 x 9 x 6 cm area of suspicious non masslike enhancement within the central/UPPER  RIGHT breast with anterior skin thickening.  Given biopsy-proven metastatic RIGHT axillary lymph node, tissue sampling of the anterior and posterior aspects of this non masslike RIGHT breast enhancement is recommended to exclude malignancy. 2. 7 x 6 x 5 cm diffuse masslike and nonmasslike enhancement within the majority of the remaining LEFT breast with diffuse LEFT breast skin thickening, compatible with malignancy. Abnormal enhancement is noted along the anterior aspect of the LEFT pectoralis muscle. 3. Three abnormal RIGHT axillary lymph nodes, 1 which is biopsy proven to be metastatic disease. No abnormal LEFT axillary lymph nodes identified.   03/20/2020 Echocardiogram   Baseline ECHO  IMPRESSIONS     1. The patient was reported to be in pain during the study and not able  to participate in this study. All that can be said is that the LV and RV  function are grossly normal. Would recommend to repeat this study when/if  the patient is able to tolerate  the exam.   2. Left ventricular ejection fraction, by estimation, is 60 to 65%. The  left ventricle has normal function. Left ventricular endocardial border  not optimally defined to evaluate regional wall motion. Left ventricular  diastolic function could not be  evaluated.   3. Right ventricular systolic function is normal. The right ventricular  size is normal.   4. The mitral valve was not well visualized. No evidence of mitral valve  regurgitation.   5. The aortic valve was not well visualized. Aortic valve regurgitation  is not visualized.   6. The inferior vena cava is normal in size with greater than 50%  respiratory variability, suggesting right atrial pressure of 3 mmHg.    03/20/2020 Imaging   CT CAP W contrast  IMPRESSION: 1. Small right axillary/subpectoral lymph nodes. Difficult to exclude metastatic disease. 2. Subtle 6 mm low-attenuation lesion in the dome of the liver, not well seen previously. Lesion is too small to characterize. Further evaluation  could be performed with MR abdomen without and with contrast, as clinically indicated. 3. Hepatic steatosis. 4. Aortic atherosclerosis (ICD10-I70.0). Coronary artery calcification.     03/20/2020 Imaging   Whole body bone scan  IMPRESSION: 1. Increased activity noted over the right breast, possibly related to the patient's right breast cancer.   2. Punctate area of increased activity over the left maxillary region, most likely related to sinus disease or dental disease. Increased activity noted over the left mandible also possibly related to dental disease. No other focal bony abnormalities to suggest metastatic disease identified.   03/22/2020 Procedure   PAC placement by Dr [name]    03/22/2020 - 08/23/2020 Chemotherapy   AC q2weeks for 4 cycles 03/22/20-05/03/20 followed by weekly Taxol and Carboplatin q3weeks for 12 weeks starting 05/17/20.                  Week 2 Taxol reduced to half dose and CT was dose reduced starting with week 4 due to thrombocytopenia. Given pancytopenia, will change Carboplatin to 50% dose weekly with Taxol 73m/m2 starting with week 7 (06/13/20). Will hold Carboplatin as needed based on labs. Carboplatin increased to 80% starting with week 8. Carboplatin dose reduced for C11 and held with C12. C12 Taxol postponed to 08/23/20   03/22/2020 Pathology Results   FINAL MICROSCOPIC DIAGNOSIS:   A. BREAST, RIGHT, PUNCH BIOPSY:  - Carcinoma involving dermal lymphatics.  - See comment.   B. BREAST, LEFT, PUNCH BIOPSY:  - Carcinoma involving dermal lymphatics.  - See comment.   COMMENT:  The morphology is compatible with ductal breast carcinoma.    ADDENDUM:   PROGNOSTIC INDICATOR RESULTS:   Immunohistochemical and morphometric analysis performed manually   The tumor cells are NEGATIVE for Her2 (0%).   Estrogen Receptor:       NEGATIVE, 0%  Progesterone Receptor:   NEGATIVE, 0%    08/13/2020 Breast MRI   IMPRESSION: 1. Interval significant response to  treatment of the diffuse non-mass enhancement involving the UPPER RIGHT breast. There is a solitary residual 5 mm indeterminate mass in the UPPER OUTER QUADRANT at MIDDLE depth with plateau kinetics. 2. Minimal response to treatment of the diffuse non-mass enhancement throughout the LEFT breast. 3. Resolution of enhancement of the skin and nipple-areolar complex of the RIGHT breast. 4. Near complete resolution of enhancement of the skin and nipple-areolar complex of the LEFT breast. Minimal residual nipple-areola complex enhancement persists. 5. The 3 previously identified pathologic RIGHT axillary lymph nodes are now normal in appearance. There is no pathologic lymphadenopathy currently.   08/23/2020 -  Chemotherapy   Immunotherapy Keytruda q3weeks starting 08/23/20 for 1 year treatment.    10/01/2020 Surgery   MASTECTOMIES WITH RIGHT SENTINEL LYMPH NODE BIOPSIES,RIGHT TARGETED RADIOACTIVE SEED LYMPH NODE, LEFT BREAST MODIFIED RADICAL MASTECTOMY  and RADIOACTIVE SEED GUIDED  TARGETED RIGHT SENTINEL LYMPH NODE BIOPSY by Dr [name]     10/01/2020 Pathology Results   FINAL MICROSCOPIC DIAGNOSIS:   A. BREAST, LEFT, MASTECTOMY:  - Invasive ductal carcinoma, 7 cm.  - Anterior and posterior soft tissue margins not involved by tumor.  - Foci of skin involvement by tumor, see comment.  - Nipple involvement by tumor.  - See oncology table and comment.   B. LYMPH NODE, LEFT AXILLARY, CONTENTS:  - One lymph node with no metastatic carcinoma (0/1).   C. BREAST, RIGHT, MASTECTOMY:  - Invasive ductal carcinoma, greater than 5 cm.  - Margins not involved.  - Subareolar breast tissue involved by tumor.  - See oncology table and comment.   D. LYMPH NODE, RIGHT #2, SENTINEL, EXCISION:  - Metastatic carcinoma in one lymph node, 0.5 cm (1/1).   E. LYMPH NODE, RIGHT #1, SENTINEL, EXCISION:  - Metastatic carcinoma in one lymph node, 1.5 cm (1/1).  - Biopsy site and biopsy clip.   F. LYMPH  NODE, RIGHT #3, SENTINEL, EXCISION:  - Metastatic carcinoma in one lymph node, 1.5 cm (1/1).   G. LYMPH NODE, RIGHT #4, SENTINEL, EXCISION:  - One lymph node with no metastatic carcinoma (0/1).   H. LYMPH NODE, RIGHT #5, SENTINEL, EXCISION:  - Metastatic carcinoma in one lymph node, 0.5 cm (1/1).   I. LYMPH NODE, RIGHT #6, SENTINEL, EXCISION:  - One lymph node with no metastatic carcinoma (0/1).    10/01/2020 Cancer Staging   Staging form: Breast, AJCC 8th Edition - Pathologic stage from 10/01/2020: Stage IIIC (pT4b, pN0, cM0, G3, ER-, PR-, HER2-) - Signed by Alla Feeling, NP on 10/24/2020   10/14/2020 Surgery   RIGHT AXILLARY LYMPH NODE DISSECTION by Dr [name]  -1 /13 positive LN for metastatic carcinoma       CURRENT THERAPY:  Keytruda q3weeks starting 08/23/20 for 1 year treatment.  PENDING Adjuvant Xeloda, starting 11/19/2019  INTERVAL HISTORY:  Victoria Coffey is here for a follow up. She presents to the clinic alone. She underwent right axillary lymph node dissection on October 24, 2020, last draining tube was removed 2 weeks ago. She is recovering well from surgery, appetite and energy level are good, pain is  minimal she is able to function well.  No fevers or chills.     All other systems were reviewed with the patient and are negative.  MEDICAL HISTORY:  Past Medical History:  Diagnosis Date  . Aortic atherosclerosis ([place])   . Breast cancer ([place]) dx'd 1993   left  . Fatty liver   . History of left breast cancer   . HIV infection ([place])   . Hypertension   . Port-A-Cath in place     SURGICAL HISTORY: Past Surgical History:  Procedure Laterality Date  . AXILLARY LYMPH NODE DISSECTION Right 10/14/2020   Procedure: left axilla removal of two drains right axillary lymph node dissection;  Surgeon: [name], MD;  Location: [facility];  Service: General;  Laterality: Right;  PEC BLOCK, RNFA OR PA  . BREAST BIOPSY Bilateral 03/22/2020   Procedure: BILATERAL BREAST  PUNCH BIOPSIES;  Surgeon: [name], MD;  Location: [place];  Service: General;  Laterality: Bilateral;  . BREAST LUMPECTOMY    . CESAREAN SECTION    . left breast lumpectomy  1993  . MASTECTOMY W/ SENTINEL NODE BIOPSY Bilateral 10/01/2020  . MASTECTOMY W/ SENTINEL NODE BIOPSY Bilateral 10/01/2020   Procedure: BILATERAL MASTECTOMIES WITH RIGHT SENTINEL LYMPH NODE BIOPSIES,RIGHT TARGETED RADIOACTIVE SEED LYMPH NODE, LEFT BREAST MODIFIED RADICAL MASTECTOMY ;  Surgeon: [name], MD;  Location: [place];  Service: General;  Laterality: Bilateral;  PEC BLOCK, RNFA  . PORTACATH PLACEMENT Right 03/22/2020   Procedure: INSERTION PORT-A-CATH WITH ULTRASOUND GUIDANCE;  Surgeon: [name], MD;  Location: [place];  Service: General;  Laterality: Right;  . RADIOACTIVE SEED GUIDED AXILLARY SENTINEL LYMPH NODE Right 10/01/2020   Procedure: RADIOACTIVE SEED GUIDED  TARGETED RIGHT SENTINEL LYMPH NODE BIOPSY;  Surgeon: [name], MD;  Location: [place];  Service: General;  Laterality: Right;  PEC BLOCK, RNFA    I have reviewed the social history and family history with the patient and they are unchanged from previous note.  ALLERGIES:  is allergic to codeine, other, grapefruit bioflavonoid complex, pomegranate [punica], and shellfish allergy.  MEDICATIONS:  Current Outpatient Medications  Medication Sig Dispense Refill  . acetaminophen (TYLENOL) 500 MG tablet Take 1,000 mg by mouth every 6 (six) hours as needed for moderate pain or headache.     . capecitabine (XELODA) 500 MG tablet Take 3 tablets (1,500 mg total) by mouth 2 (two) times daily after a meal. Take for 14 days on, 7 days off, repeat every 21 days. 84 tablet 1  . emtricitabine-rilpivir-tenofovir AF (ODEFSEY) 200-25-25 MG TABS tablet Take 1 tablet by mouth daily with breakfast. 30 tablet 11  . gabapentin (NEURONTIN) 100 MG capsule Take 1 capsule (100 mg total) by mouth 2 (two) times daily. 60 capsule 0  . hydrochlorothiazide (HYDRODIURIL) 25  MG tablet TAKE 1 TABLET(25 MG) BY MOUTH DAILY 30 tablet 0  . lidocaine-prilocaine (EMLA) cream Apply 1 application topically See admin instructions. Apply 1-2 hours prior to access    . loratadine (CLARITIN) 10 MG tablet Take 10 mg by mouth See admin instructions. Takes 3-4 days before and after treatment    . methocarbamol (ROBAXIN) 500 MG tablet Take 1 tablet (500 mg total) by mouth every 6 (six) hours as needed for muscle spasms. 20 tablet 0  . oxyCODONE (OXY IR/ROXICODONE) 5 MG immediate release tablet Take 1-2 tablets (5-10 mg total) by mouth every 4 (four) hours as needed for moderate pain, severe pain or breakthrough pain. 10 tablet 0  . potassium chloride SA (KLOR-CON)  20 MEQ tablet Take 1 tablet (20 mEq total) by mouth 2 (two) times daily. Take 1 tab twice daily for 3 days then 1 tab once daily (Patient taking differently: Take 20 mEq by mouth 2 (two) times daily.) 60 tablet 1  . traMADol (ULTRAM) 50 MG tablet Take 1 tablet (50 mg total) by mouth every 6 (six) hours as needed (mild pain). 30 tablet 1   No current facility-administered medications for this visit.   Facility-Administered Medications Ordered in Other Visits  Medication Dose Route Frequency Provider Last Rate Last Admin  . sodium chloride flush (NS) 0.9 % injection 10 mL  10 mL Intracatheter PRN [name], MD   10 mL at 11/14/20 1053    PHYSICAL EXAMINATION: ECOG PERFORMANCE STATUS: 1 - Symptomatic but completely ambulatory  Vitals:   11/14/20 0842 11/14/20 0906  BP: (!) 146/107 (!) 144/105  Pulse: 93   Resp: 14   Temp: 97.7 F (36.5 C)   SpO2: 100%    Filed Weights   11/14/20 0842  Weight: 130 lb 6.4 oz (59.1 kg)    GENERAL:alert, no distress and comfortable SKIN: skin color, texture, turgor are normal, no rashes or significant lesions EYES: normal, Conjunctiva are pink and non-injected, sclera clear NECK: supple, thyroid normal size, non-tender, without nodularity LYMPH:  no palpable lymphadenopathy in the  cervical, axillary  LUNGS: clear to auscultation and percussion with normal breathing effort HEART: regular rate & rhythm and no murmurs and no lower extremity edema ABDOMEN:abdomen soft, non-tender and normal bowel sounds Musculoskeletal:no cyanosis of digits and no clubbing  NEURO: alert & oriented x 3 with fluent speech, no focal motor/sensory deficits Breasts: Breast inspection showed b/l mastectomy, surgical incisions have healed well, although still covered by strips, no discharge, no skin erythema.  No palpable mass or axillary adenopathy.  LABORATORY DATA:  I have reviewed the data as listed CBC Latest Ref Rng & Units 11/14/2020 10/24/2020 10/14/2020  WBC 4.0 - 10.5 K/uL 4.4 4.2 5.7  Hemoglobin 12.0 - 15.0 g/dL 10.4(L) 9.4(L) 8.8(L)  Hematocrit 36.0 - 46.0 % 31.8(L) 28.7(L) 28.1(L)  Platelets 150 - 400 K/uL 255 244 272     CMP Latest Ref Rng & Units 11/14/2020 10/24/2020 10/14/2020  Glucose 70 - 99 mg/dL 100(H) 101(H) 99  BUN 8 - 23 mg/dL '13 15 19  ' Creatinine 0.44 - 1.00 mg/dL 0.93 0.85 1.07(H)  Sodium 135 - 145 mmol/L 137 137 142  Potassium 3.5 - 5.1 mmol/L 3.3(L) 3.4(L) 3.9  Chloride 98 - 111 mmol/L 105 105 108  CO2 22 - 32 mmol/L '23 23 25  ' Calcium 8.9 - 10.3 mg/dL 9.3 9.0 8.7(L)  Total Protein 6.5 - 8.1 g/dL 7.5 7.0 6.4(L)  Total Bilirubin 0.3 - 1.2 mg/dL 0.2(L) <0.2(L) 0.3  Alkaline Phos 38 - 126 U/L 347(H) 210(H) 159(H)  AST 15 - 41 U/L 53(H) 36 30  ALT 0 - 44 U/L 50(H) 29 23      RADIOGRAPHIC STUDIES: I have personally reviewed the radiological images as listed and agreed with the findings in the report. No results found.   ASSESSMENT & PLAN:  Victoria Coffey is a 65 y.o. 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 -Further work up showed  9cm nodular area in right breast and skin biopsy confirmed cancer involvement -Staging scans were negative for distant metastasis -She completed AC X4 and weekly TC (Carboplatin intermittent held for cytopenias) 03/22/20-08/23/20.  -Given recentpublish Keynote 522 trial data,I started her on Immunotherapy Keytruda q3weeks for 1 year of treatment beginning 08/23/20.  -She underwent b/l mastectomy with Dr [name] on 10/01/20. Her surgical pathology showed 7cm invasive ductal carcinoma of left breast was completely removed with clear margins and LN negative. Her >5cm invasive ductal carcinoma of right breast was completely removed, clear margins but 4/6 positive LN. -She underwent right axillary lymph node dissection on 10/14/2020, final path showed 1 of 13 positive LNs with metastatic carcinoma, I reviewed with pt  -Due to her significant residual disease, I recommended adjuvant Xeloda 6 months to reduce her high risk of recurrence after surgery. Treatment plan also includes adjuvant radiation to right chest wall and axilla when she has completely healed from surgery. She will continue Keytruda every 3 weeks to complete 1 year treatment. -Labs reviewed, adequate for treatment, will proceed with Keytruda today.   -Plan to start Xeloda next Monday 2017 she will take 1500 mg twice daily, 2 weeks on and one week off, will reduce to 1586m am, 10039mpm M-F when she is on radiation.  Side effects reviewed, she agrees to proceed -will refer her to Dr. MoLisbeth Renshawor adjuvant breast radiation    2.anemia  -secondary to chemo -stable overall, monitoring   3. H/o Left breast cancer, Dx in 1993s/p lumpectomy, radiation, chemo, Tamoxifen.Genetic Testinghas not been done yet.  4. Comorbidities: HIV(+)Dx 1993, HTN -Her HIV has been undetectable and well controlled. She is  currently being seen by ID Dr SnGraylon Good5. Hypokalemia -continue KCL   6.  Mild transaminitis -We will monitor   PLAN: -Reviewed the surgical pathology reviewed with patient -She has healed well from surgery, plan to start Xarelto next Monday -Labs reviewed, adequate for treatment, [facility] and [facility] -Follow-up in 3 weeks, phone visit in 1 to 2 weeks to check her toxicity from Xeloda    No problem-specific Assessment & Plan notes found for this encounter.   Orders Placed This Encounter  Procedures  . Ambulatory referral to Radiation Oncology    Referral Priority:   Routine    Referral Type:   Consultation    Referral Reason:   Specialty Services Required    Requested Specialty:   Radiation Oncology    Number of Visits Requested:   1  . Ambulatory referral to Physical Therapy    Referral Priority:   Routine    Referral Type:   Physical Medicine    Referral Reason:   Specialty Services Required    Requested Specialty:   Physical Therapy    Number of Visits Requested:   1   All questions were answered. The patient knows to call the clinic with any problems, questions or concerns. No barriers to learning was detected. The total time spent in the appointment was 40 minutes.     YaTruitt MerleMD 11/14/2020   I, AmJoslyn Devonam acting as scribe for YaTruitt MerleMD.   I have reviewed the above documentation for accuracy and completeness, and I agree with the above.

## 2020-11-14 ENCOUNTER — Encounter: Payer: Self-pay | Admitting: *Deleted

## 2020-11-14 ENCOUNTER — Inpatient Hospital Stay: Payer: BC Managed Care – PPO

## 2020-11-14 ENCOUNTER — Encounter: Payer: Self-pay | Admitting: Hematology

## 2020-11-14 ENCOUNTER — Other Ambulatory Visit: Payer: Self-pay

## 2020-11-14 ENCOUNTER — Inpatient Hospital Stay: Payer: BC Managed Care – PPO | Attending: Hematology | Admitting: Hematology

## 2020-11-14 VITALS — BP 141/96 | HR 85

## 2020-11-14 VITALS — BP 144/105 | HR 93 | Temp 97.7°F | Resp 14 | Ht 60.0 in | Wt 130.4 lb

## 2020-11-14 DIAGNOSIS — Z9013 Acquired absence of bilateral breasts and nipples: Secondary | ICD-10-CM | POA: Diagnosis not present

## 2020-11-14 DIAGNOSIS — C50412 Malignant neoplasm of upper-outer quadrant of left female breast: Secondary | ICD-10-CM | POA: Insufficient documentation

## 2020-11-14 DIAGNOSIS — R7401 Elevation of levels of liver transaminase levels: Secondary | ICD-10-CM | POA: Diagnosis not present

## 2020-11-14 DIAGNOSIS — I10 Essential (primary) hypertension: Secondary | ICD-10-CM | POA: Insufficient documentation

## 2020-11-14 DIAGNOSIS — D6481 Anemia due to antineoplastic chemotherapy: Secondary | ICD-10-CM | POA: Insufficient documentation

## 2020-11-14 DIAGNOSIS — Z170 Estrogen receptor positive status [ER+]: Secondary | ICD-10-CM | POA: Diagnosis not present

## 2020-11-14 DIAGNOSIS — Z21 Asymptomatic human immunodeficiency virus [HIV] infection status: Secondary | ICD-10-CM | POA: Insufficient documentation

## 2020-11-14 DIAGNOSIS — Z171 Estrogen receptor negative status [ER-]: Secondary | ICD-10-CM | POA: Diagnosis not present

## 2020-11-14 DIAGNOSIS — Z5112 Encounter for antineoplastic immunotherapy: Secondary | ICD-10-CM | POA: Insufficient documentation

## 2020-11-14 DIAGNOSIS — E876 Hypokalemia: Secondary | ICD-10-CM | POA: Insufficient documentation

## 2020-11-14 DIAGNOSIS — Z923 Personal history of irradiation: Secondary | ICD-10-CM | POA: Diagnosis not present

## 2020-11-14 LAB — CMP (CANCER CENTER ONLY)
ALT: 50 U/L — ABNORMAL HIGH (ref 0–44)
AST: 53 U/L — ABNORMAL HIGH (ref 15–41)
Albumin: 3.3 g/dL — ABNORMAL LOW (ref 3.5–5.0)
Alkaline Phosphatase: 347 U/L — ABNORMAL HIGH (ref 38–126)
Anion gap: 9 (ref 5–15)
BUN: 13 mg/dL (ref 8–23)
CO2: 23 mmol/L (ref 22–32)
Calcium: 9.3 mg/dL (ref 8.9–10.3)
Chloride: 105 mmol/L (ref 98–111)
Creatinine: 0.93 mg/dL (ref 0.44–1.00)
GFR, Estimated: >60 mL/min (ref >60)
Glucose, Bld: 100 mg/dL — ABNORMAL HIGH (ref 70–99)
Potassium: 3.3 mmol/L — ABNORMAL LOW (ref 3.5–5.1)
Sodium: 137 mmol/L (ref 135–145)
Total Bilirubin: 0.2 mg/dL — ABNORMAL LOW (ref 0.3–1.2)
Total Protein: 7.5 g/dL (ref 6.5–8.1)

## 2020-11-14 LAB — CBC WITH DIFFERENTIAL (CANCER CENTER ONLY)
Abs Immature Granulocytes: 0.01 10*3/uL (ref 0.00–0.07)
Basophils Absolute: 0 10*3/uL (ref 0.0–0.1)
Basophils Relative: 1 %
Eosinophils Absolute: 0.1 10*3/uL (ref 0.0–0.5)
Eosinophils Relative: 1 %
HCT: 31.8 % — ABNORMAL LOW (ref 36.0–46.0)
Hemoglobin: 10.4 g/dL — ABNORMAL LOW (ref 12.0–15.0)
Immature Granulocytes: 0 %
Lymphocytes Relative: 26 %
Lymphs Abs: 1.2 10*3/uL (ref 0.7–4.0)
MCH: 30.6 pg (ref 26.0–34.0)
MCHC: 32.7 g/dL (ref 30.0–36.0)
MCV: 93.5 fL (ref 80.0–100.0)
Monocytes Absolute: 0.4 10*3/uL (ref 0.1–1.0)
Monocytes Relative: 8 %
Neutro Abs: 2.8 10*3/uL (ref 1.7–7.7)
Neutrophils Relative %: 64 %
Platelet Count: 255 10*3/uL (ref 150–400)
RBC: 3.4 MIL/uL — ABNORMAL LOW (ref 3.87–5.11)
RDW: 13.1 % (ref 11.5–15.5)
WBC Count: 4.4 10*3/uL (ref 4.0–10.5)
nRBC: 0 % (ref 0.0–0.2)

## 2020-11-14 MED ORDER — SODIUM CHLORIDE 0.9 % IV SOLN
Freq: Once | INTRAVENOUS | Status: AC
Start: 2020-11-14 — End: 2020-11-14
  Filled 2020-11-14: qty 250

## 2020-11-14 MED ORDER — HEPARIN SOD (PORK) LOCK FLUSH 100 UNIT/ML IV SOLN
500.0000 [IU] | Freq: Once | INTRAVENOUS | Status: AC | PRN
  Administered 2020-11-14: 500 [IU]
  Filled 2020-11-14: qty 5

## 2020-11-14 MED ORDER — SODIUM CHLORIDE 0.9% FLUSH
10.0000 mL | INTRAVENOUS | Status: DC | PRN
  Administered 2020-11-14: 10 mL
  Filled 2020-11-14: qty 10

## 2020-11-14 MED ORDER — PEMBROLIZUMAB CHEMO INJECTION 100 MG/4ML
200.0000 mg | Freq: Once | INTRAVENOUS | Status: AC
  Administered 2020-11-14: 200 mg via INTRAVENOUS
  Filled 2020-11-14: qty 8

## 2020-11-14 NOTE — Patient Instructions (Signed)

## 2020-11-14 NOTE — Patient Instructions (Signed)
[facility] Discharge Instructions for Patients Receiving Chemotherapy  Today you received the following chemotherapy agents :  Keytruda.  To help prevent nausea and vomiting after your treatment, we encourage you to take your nausea medication as prescribed.   If you develop nausea and vomiting that is not controlled by your nausea medication, call the clinic.   BELOW ARE SYMPTOMS THAT SHOULD BE REPORTED IMMEDIATELY:  *FEVER GREATER THAN 100.5 F  *CHILLS WITH OR WITHOUT FEVER  NAUSEA AND VOMITING THAT IS NOT CONTROLLED WITH YOUR NAUSEA MEDICATION  *UNUSUAL SHORTNESS OF BREATH  *UNUSUAL BRUISING OR BLEEDING  TENDERNESS IN MOUTH AND THROAT WITH OR WITHOUT PRESENCE OF ULCERS  *URINARY PROBLEMS  *BOWEL PROBLEMS  UNUSUAL RASH Items with * indicate a potential emergency and should be followed up as soon as possible.  Feel free to call the clinic should you have any questions or concerns. The clinic phone number is [phone].  Please show the CHEMO ALERT CARD at check-in to the Emergency Department and triage nurse.  

## 2020-11-15 LAB — CANCER ANTIGEN 27.29: CA 27.29: 582.2 U/mL — ABNORMAL HIGH (ref 0.0–38.6)

## 2020-11-18 ENCOUNTER — Telehealth: Payer: Self-pay | Admitting: Hematology

## 2020-11-18 NOTE — Telephone Encounter (Signed)
Scheduled appts per 1/13 los. Pt confirmed appt date and time.  

## 2020-11-24 NOTE — Progress Notes (Signed)
[place]   [phone] [phone]   Clinic Follow up Note   Patient Care Team: [name], MD as PCP - General (Infectious Diseases) [name], MD as PCP - Infectious Diseases (Infectious Diseases) [name], MD as Consulting Physician (General Surgery) [name], MD as Consulting Physician (Hematology) [name], MD as Consulting Physician (Radiation Oncology) [name], RN as Oncology Nurse Navigator [name], RN as Oncology Nurse Navigator 11/25/2020  I connected with Victoria Coffey on 11/25/20 at  8:15 AM EST by telephone visit and verified that I am speaking with the correct person using two identifiers.   I discussed the limitations, risks, security and privacy concerns of performing an evaluation and management service by telemedicine and the availability of in-person appointments. I also discussed with the patient that there may be a patient responsible charge related to this service. The patient expressed understanding and agreed to proceed.   Other persons participating in the visit and their role in the encounter: None  Patient's location: Home  Provider's location: [place] office   CHIEF COMPLAINT: F/up bilateral breast cancer, Xeloda toxicity check    SUMMARY OF ONCOLOGIC HISTORY: Oncology History Overview Note  Cancer Staging Bilateral breast cancer ([place]) Staging form: Breast, AJCC 8th Edition - Pathologic stage from 10/01/2020: Stage IIIC (pT3, pN2a, cM0, G3, ER-, PR-, HER2-) - Signed by Alla Feeling, NP on 10/24/2020  Malignant neoplasm of upper-outer quadrant of left breast in female, estrogen receptor positive ([place]) Staging form: Breast, AJCC 8th Edition - Clinical stage from 03/01/2020: Stage IIIC (cT4, cN0, cM0, G3, ER+, PR-, HER2-) - Signed by [name], MD on 08/22/2020 - Pathologic stage from 10/01/2020: Stage IIIC (pT4b, pN0, cM0, G3, ER-, PR-, HER2-) - Signed by Alla Feeling, NP on  10/24/2020    Malignant neoplasm of upper-outer quadrant of left breast in female, estrogen receptor negative ([place])  02/28/2020 Mammogram   Diagnostic Mammogram 02/28/20  IMPRESSION The 3.9 cm ill defined mass in the left breast posterior depth central 4.3cm to the nipple seen on the mediolateral oblique view onlt with associated difssue skin thickening is highly suspicious for malignancy.    03/01/2020 Cancer Staging   Staging form: Breast, AJCC 8th Edition - Clinical stage from 03/01/2020: Stage IIIC (cT4, cN0, cM0, G3, ER+, PR-, HER2-) - Signed by [name], MD on 08/22/2020   03/01/2020 Initial Biopsy   Diagnosis 03/01/20  1. Breast, left, needle core biopsy, 9 o'clock, 3-4cmfn - INVASIVE DUCTAL CARCINOMA. SEE NOTE 2. Lymph node, needle/core biopsy, right axilla - INVASIVE DUCTAL CARCINOMA. SEE NOTE Diagnosis Note 1. Invasive carcinoma measures 1.5 cm in greatest linear dimension and appears grade 3. Dr. [name] reviewed the case and concurs with the diagnosis. A breast prognostic profile (ER, PR, Ki-67 and HER2) is pending and will be reported in an addendum. Dr. [name] was notified on 03/04/2020. 2. Carcinoma measures 0.6 cm in greatest linear dimension and appears grade 3. Lymphoid tissue is not identified - this may represent a completely replaced lymph node. A breast prognostic profile (ER, PR, Ki-67 and HER2) is pending and will be reported in an addendum. Dr. [name] reviewed the case and concurs with the diagnosis. Dr. [name] was notified on 03/04/2020.   03/01/2020 Receptors her2   1. PROGNOSTIC INDICATORS Results: IMMUNOHISTOCHEMICAL AND MORPHOMETRIC ANALYSIS PERFORMED MANUALLY The tumor cells are NEGATIVE for Her2 (0). Estrogen Receptor: 20%, POSITIVE, WEAK STAINING INTENSITY Progesterone Receptor: 0%, NEGATIVE Proliferation Marker Ki67: 40% COMMENT: The negative  hormone receptor study(ies) in this case has an internal positive control.    2. PROGNOSTIC  INDICATORS Results: IMMUNOHISTOCHEMICAL AND MORPHOMETRIC ANALYSIS PERFORMED MANUALLY The tumor cells are NEGATIVE for Her2 (1+). Estrogen Receptor: 10%, POSITIVE, WEAK STAINING INTENSITY Progesterone Receptor: 0%, NEGATIVE Proliferation Marker Ki67: 40% COMMENT: The negative hormone receptor study(ies) in this case has no internal positive control.   03/05/2020 Initial Diagnosis   Malignant neoplasm of upper-outer quadrant of left breast in female, estrogen receptor positive ([place])   03/18/2020 Breast MRI   IMPRESSION: 1. 7 x 9 x 6 cm area of suspicious non masslike enhancement within the central/UPPER RIGHT breast with anterior skin thickening. Given biopsy-proven metastatic RIGHT axillary lymph node, tissue sampling of the anterior and posterior aspects of this non masslike RIGHT breast enhancement is recommended to exclude malignancy. 2. 7 x 6 x 5 cm diffuse masslike and nonmasslike enhancement within the majority of the remaining LEFT breast with diffuse LEFT breast skin thickening, compatible with malignancy. Abnormal enhancement is noted along the anterior aspect of the LEFT pectoralis muscle. 3. Three abnormal RIGHT axillary lymph nodes, 1 which is biopsy proven to be metastatic disease. No abnormal LEFT axillary lymph nodes identified.   03/20/2020 Echocardiogram   Baseline ECHO  IMPRESSIONS     1. The patient was reported to be in pain during the study and not able  to participate in this study. All that can be said is that the LV and RV  function are grossly normal. Would recommend to repeat this study when/if  the patient is able to tolerate  the exam.   2. Left ventricular ejection fraction, by estimation, is 60 to 65%. The  left ventricle has normal function. Left ventricular endocardial border  not optimally defined to evaluate regional wall motion. Left ventricular  diastolic function could not be  evaluated.   3. Right ventricular systolic function is normal. The  right ventricular  size is normal.   4. The mitral valve was not well visualized. No evidence of mitral valve  regurgitation.   5. The aortic valve was not well visualized. Aortic valve regurgitation  is not visualized.   6. The inferior vena cava is normal in size with greater than 50%  respiratory variability, suggesting right atrial pressure of 3 mmHg.    03/20/2020 Imaging   CT CAP W contrast  IMPRESSION: 1. Small right axillary/subpectoral lymph nodes. Difficult to exclude metastatic disease. 2. Subtle 6 mm low-attenuation lesion in the dome of the liver, not well seen previously. Lesion is too small to characterize. Further evaluation could be performed with MR abdomen without and with contrast, as clinically indicated. 3. Hepatic steatosis. 4. Aortic atherosclerosis (ICD10-I70.0). Coronary artery calcification.     03/20/2020 Imaging   Whole body bone scan  IMPRESSION: 1. Increased activity noted over the right breast, possibly related to the patient's right breast cancer.   2. Punctate area of increased activity over the left maxillary region, most likely related to sinus disease or dental disease. Increased activity noted over the left mandible also possibly related to dental disease. No other focal bony abnormalities to suggest metastatic disease identified.   03/22/2020 Procedure   PAC placement by Dr [name]    03/22/2020 - 08/23/2020 Chemotherapy   AC q2weeks for 4 cycles 03/22/20-05/03/20 followed by weekly Taxol and Carboplatin q3weeks for 12 weeks starting 05/17/20.                  Week 2 Taxol reduced to half dose  and CT was dose reduced starting with week 4 due to thrombocytopenia. Given pancytopenia, will change Carboplatin to 50% dose weekly with Taxol 26m/m2 starting with week 7 (06/13/20). Will hold Carboplatin as needed based on labs. Carboplatin increased to 80% starting with week 8. Carboplatin dose reduced for C11 and held with C12. C12 Taxol postponed to  08/23/20   03/22/2020 Pathology Results   FINAL MICROSCOPIC DIAGNOSIS:   A. BREAST, RIGHT, PUNCH BIOPSY:  - Carcinoma involving dermal lymphatics.  - See comment.   B. BREAST, LEFT, PUNCH BIOPSY:  - Carcinoma involving dermal lymphatics.  - See comment.   COMMENT:  The morphology is compatible with ductal breast carcinoma.    ADDENDUM:   PROGNOSTIC INDICATOR RESULTS:   Immunohistochemical and morphometric analysis performed manually   The tumor cells are NEGATIVE for Her2 (0%).   Estrogen Receptor:       NEGATIVE, 0%  Progesterone Receptor:   NEGATIVE, 0%    08/13/2020 Breast MRI   IMPRESSION: 1. Interval significant response to treatment of the diffuse non-mass enhancement involving the UPPER RIGHT breast. There is a solitary residual 5 mm indeterminate mass in the UPPER OUTER QUADRANT at MIDDLE depth with plateau kinetics. 2. Minimal response to treatment of the diffuse non-mass enhancement throughout the LEFT breast. 3. Resolution of enhancement of the skin and nipple-areolar complex of the RIGHT breast. 4. Near complete resolution of enhancement of the skin and nipple-areolar complex of the LEFT breast. Minimal residual nipple-areola complex enhancement persists. 5. The 3 previously identified pathologic RIGHT axillary lymph nodes are now normal in appearance. There is no pathologic lymphadenopathy currently.   08/23/2020 -  Chemotherapy   Immunotherapy Keytruda q3weeks starting 08/23/20 for 1 year treatment.    10/01/2020 Surgery   MASTECTOMIES WITH RIGHT SENTINEL LYMPH NODE BIOPSIES,RIGHT TARGETED RADIOACTIVE SEED LYMPH NODE, LEFT BREAST MODIFIED RADICAL MASTECTOMY  and RADIOACTIVE SEED GUIDED  TARGETED RIGHT SENTINEL LYMPH NODE BIOPSY by Dr BBarry Dienes    10/01/2020 Pathology Results   FINAL MICROSCOPIC DIAGNOSIS:   A. BREAST, LEFT, MASTECTOMY:  - Invasive ductal carcinoma, 7 cm.  - Anterior and posterior soft tissue margins not involved by tumor.  - Foci  of skin involvement by tumor, see comment.  - Nipple involvement by tumor.  - See oncology table and comment.   B. LYMPH NODE, LEFT AXILLARY, CONTENTS:  - One lymph node with no metastatic carcinoma (0/1).   C. BREAST, RIGHT, MASTECTOMY:  - Invasive ductal carcinoma, greater than 5 cm.  - Margins not involved.  - Subareolar breast tissue involved by tumor.  - See oncology table and comment.   D. LYMPH NODE, RIGHT #2, SENTINEL, EXCISION:  - Metastatic carcinoma in one lymph node, 0.5 cm (1/1).   E. LYMPH NODE, RIGHT #1, SENTINEL, EXCISION:  - Metastatic carcinoma in one lymph node, 1.5 cm (1/1).  - Biopsy site and biopsy clip.   F. LYMPH NODE, RIGHT #3, SENTINEL, EXCISION:  - Metastatic carcinoma in one lymph node, 1.5 cm (1/1).   G. LYMPH NODE, RIGHT #4, SENTINEL, EXCISION:  - One lymph node with no metastatic carcinoma (0/1).   H. LYMPH NODE, RIGHT #5, SENTINEL, EXCISION:  - Metastatic carcinoma in one lymph node, 0.5 cm (1/1).   I. LYMPH NODE, RIGHT #6, SENTINEL, EXCISION:  - One lymph node with no metastatic carcinoma (0/1).    10/01/2020 Cancer Staging   Staging form: Breast, AJCC 8th Edition - Pathologic stage from 10/01/2020: Stage IIIC (pT4b, pN0, cM0, G3, ER-, PR-,  HER2-) - Signed by Alla Feeling, NP on 10/24/2020   10/14/2020 Surgery   RIGHT AXILLARY LYMPH NODE DISSECTION by Dr [name]  -1 /13 positive LN for metastatic carcinoma      CURRENT THERAPY:  1. Keytruda q3weeks starting 08/23/20 for 1 year treatment. 2. Adjuvant Xeloda 1500 mg BID days 1-14 q21 days x6 months, starting 11/18/2020 (will reduce to 1500 mg Am/1000 mg Pm M-F during radiation)  INTERVAL HISTORY: Victoria Coffey presents for phone toxicity check as scheduled. She was last seen 11/14/20. She continues pembrolizumab and began adjuvant Xeloda on 11/18/20. She is tolerating well so far, no major side effects. Hands are dry she attributes to weather. No redness, cracks, pain, or neuropathy. She uses  utter cream. Continues exercises/stairs at home. Po intake is good. Denies fever, chills, cough, chest pain, dyspnea, leg edema, breast concerns, diarrhea, mucositis, headaches, dizziness or other concerns.    MEDICAL HISTORY:  Past Medical History:  Diagnosis Date  . Aortic atherosclerosis ([place])   . Breast cancer ([place]) dx'd 1993   left  . Fatty liver   . History of left breast cancer   . HIV infection ([place])   . Hypertension   . Port-A-Cath in place     SURGICAL HISTORY: Past Surgical History:  Procedure Laterality Date  . AXILLARY LYMPH NODE DISSECTION Right 10/14/2020   Procedure: left axilla removal of two drains right axillary lymph node dissection;  Surgeon: [name], MD;  Location: [facility];  Service: General;  Laterality: Right;  PEC BLOCK, RNFA OR PA  . BREAST BIOPSY Bilateral 03/22/2020   Procedure: BILATERAL BREAST PUNCH BIOPSIES;  Surgeon: [name], MD;  Location: [place];  Service: General;  Laterality: Bilateral;  . BREAST LUMPECTOMY    . CESAREAN SECTION    . left breast lumpectomy  1993  . MASTECTOMY W/ SENTINEL NODE BIOPSY Bilateral 10/01/2020  . MASTECTOMY W/ SENTINEL NODE BIOPSY Bilateral 10/01/2020   Procedure: BILATERAL MASTECTOMIES WITH RIGHT SENTINEL LYMPH NODE BIOPSIES,RIGHT TARGETED RADIOACTIVE SEED LYMPH NODE, LEFT BREAST MODIFIED RADICAL MASTECTOMY ;  Surgeon: [name], MD;  Location: [place];  Service: General;  Laterality: Bilateral;  PEC BLOCK, RNFA  . PORTACATH PLACEMENT Right 03/22/2020   Procedure: INSERTION PORT-A-CATH WITH ULTRASOUND GUIDANCE;  Surgeon: [name], MD;  Location: [place];  Service: General;  Laterality: Right;  . RADIOACTIVE SEED GUIDED AXILLARY SENTINEL LYMPH NODE Right 10/01/2020   Procedure: RADIOACTIVE SEED GUIDED  TARGETED RIGHT SENTINEL LYMPH NODE BIOPSY;  Surgeon: [name], MD;  Location: [place];  Service: General;  Laterality: Right;  PEC BLOCK, RNFA    I have reviewed the social history and family history with  the patient and they are unchanged from previous note.  ALLERGIES:  is allergic to codeine, other, grapefruit bioflavonoid complex, pomegranate [punica], and shellfish allergy.  MEDICATIONS:  Current Outpatient Medications  Medication Sig Dispense Refill  . acetaminophen (TYLENOL) 500 MG tablet Take 1,000 mg by mouth every 6 (six) hours as needed for moderate pain or headache.     . capecitabine (XELODA) 500 MG tablet Take 3 tablets (1,500 mg total) by mouth 2 (two) times daily after a meal. Take for 14 days on, 7 days off, repeat every 21 days. 84 tablet 1  . emtricitabine-rilpivir-tenofovir AF (ODEFSEY) 200-25-25 MG TABS tablet Take 1 tablet by mouth daily with breakfast. 30 tablet 11  . gabapentin (NEURONTIN) 100 MG capsule Take 1 capsule (100 mg total) by mouth 2 (two) times daily. 60 capsule 0  .  hydrochlorothiazide (HYDRODIURIL) 25 MG tablet TAKE 1 TABLET(25 MG) BY MOUTH DAILY 30 tablet 0  . lidocaine-prilocaine (EMLA) cream Apply 1 application topically See admin instructions. Apply 1-2 hours prior to access    . loratadine (CLARITIN) 10 MG tablet Take 10 mg by mouth See admin instructions. Takes 3-4 days before and after treatment    . methocarbamol (ROBAXIN) 500 MG tablet Take 1 tablet (500 mg total) by mouth every 6 (six) hours as needed for muscle spasms. (Patient not taking: Reported on 11/25/2020) 20 tablet 0  . oxyCODONE (OXY IR/ROXICODONE) 5 MG immediate release tablet Take 1-2 tablets (5-10 mg total) by mouth every 4 (four) hours as needed for moderate pain, severe pain or breakthrough pain. (Patient not taking: Reported on 11/25/2020) 10 tablet 0  . potassium chloride SA (KLOR-CON) 20 MEQ tablet Take 1 tablet (20 mEq total) by mouth 2 (two) times daily. Take 1 tab twice daily for 3 days then 1 tab once daily (Patient taking differently: Take 20 mEq by mouth 2 (two) times daily.) 60 tablet 1  . traMADol (ULTRAM) 50 MG tablet Take 1 tablet (50 mg total) by mouth every 6 (six) hours as  needed (mild pain). 30 tablet 1   No current facility-administered medications for this visit.    PHYSICAL EXAMINATION: ECOG PERFORMANCE STATUS: 0 - Asymptomatic  There were no vitals filed for this visit. There were no vitals filed for this visit.  Patient appears well over the phone. Speech is clear. Mood/affect appropriate. No cough or conversational dyspnea.   LABORATORY DATA:  No labs for this visit.   RADIOGRAPHIC STUDIES: I have personally reviewed the radiological images as listed and agreed with the findings in the report. No results found.   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  -Given recentpublish [employer] 522 trial data,she started Immunotherapy Keytruda q3weeks for 1 year of treatment beginning 08/23/20. -S/p b/l mastectomy with Dr [name] on 10/01/20.Her surgical pathology showed 7cm invasive ductal carcinoma of left breast which was completely removed with clear margins and LN negative, and triple negative. Her >5cm invasive ductal carcinoma of right breast was completely removed, clear margins but 4/6 positive LN.  -She underwent right axillary lymph node dissection on 10/14/2020, final path showed 1 of 13 positive LNs with metastatic carcinoma -Due to her residual disease, to reduce her high risk of recurrence after surgery, Dr. Burr Medico started her on  adjuvant Xeloda on 11/18/20. Plan to complete 6 months.  -She will continue Keytruda every 3 weeks to complete 1 year treatment. -she has been referred to RT, consult 11/26/20, [facility] 11/28/20  2.Pancytopenia secondary to chemo -resolved except mild anemia -monitoring   3. H/o Left breast cancer, Dx in 1993s/p lumpectomy, radiation, chemo, Tamoxifen.Genetic Testinghas not been done yet.  4. Comorbidities: HIV(+)Dx 1993, HTN -per ID [name], undetectable and well controlled   Disposition:  Ms. Duling appears stable. She continues q3 week pembrolizumab and began adjuvant Xeloda 1500 mg BID (2 weeks on/1 week off) on 11/18/20. She is tolerating well  without significant toxicities. We reviewed symptom management in the event she experiences diarrhea, mucositis, hand/foot syndrome, etc. She will complete 14 days on 12/01/20, then 7 days off.   She will return for lab, office visit, and next pembro on 2/3, which will be her week off Xeloda. She will have RT consult 1/25 and [facility] 1/27. Anticipate she will start on 12/09/20 at which point she will change Xeloda to 1500 mg AM/1000 mg PM on M-F during her course of adjuvant concurrent RT.   All questions were answered. The patient knows to call the clinic with any problems, questions or concerns. No barriers to learning were detected. No need for in-person visit at this time. I spent 11 minutes on today's call.      Alla Feeling, NP 11/25/20

## 2020-11-25 ENCOUNTER — Inpatient Hospital Stay (HOSPITAL_BASED_OUTPATIENT_CLINIC_OR_DEPARTMENT_OTHER): Payer: BC Managed Care – PPO | Admitting: Nurse Practitioner

## 2020-11-25 ENCOUNTER — Encounter: Payer: Self-pay | Admitting: Radiation Oncology

## 2020-11-25 ENCOUNTER — Telehealth: Payer: Self-pay

## 2020-11-25 ENCOUNTER — Other Ambulatory Visit: Payer: Self-pay

## 2020-11-25 ENCOUNTER — Encounter: Payer: Self-pay | Admitting: Nurse Practitioner

## 2020-11-25 DIAGNOSIS — C50912 Malignant neoplasm of unspecified site of left female breast: Secondary | ICD-10-CM

## 2020-11-25 DIAGNOSIS — C50911 Malignant neoplasm of unspecified site of right female breast: Secondary | ICD-10-CM

## 2020-11-25 DIAGNOSIS — Z171 Estrogen receptor negative status [ER-]: Secondary | ICD-10-CM | POA: Diagnosis not present

## 2020-11-25 NOTE — Telephone Encounter (Signed)
Spoke with patient in regards to telephone visit with Victoria Simpson PA on 11/26/20 @ 9:30am. Reviewed meaningful use questions. TM

## 2020-11-26 ENCOUNTER — Ambulatory Visit
Admission: RE | Admit: 2020-11-26 | Discharge: 2020-11-26 | Disposition: A | Discharge status: Home / Self Care | Payer: BC Managed Care – PPO | Source: Ambulatory Visit | Attending: Radiation Oncology | Admitting: Radiation Oncology

## 2020-11-26 ENCOUNTER — Telehealth: Payer: Self-pay | Admitting: Nurse Practitioner

## 2020-11-26 DIAGNOSIS — Z170 Estrogen receptor positive status [ER+]: Secondary | ICD-10-CM

## 2020-11-26 DIAGNOSIS — C50411 Malignant neoplasm of upper-outer quadrant of right female breast: Secondary | ICD-10-CM

## 2020-11-26 DIAGNOSIS — C50412 Malignant neoplasm of upper-outer quadrant of left female breast: Secondary | ICD-10-CM

## 2020-11-26 NOTE — Progress Notes (Cosign Needed Addendum)
Radiation Oncology         [phone] ________________________________  Outpatient Follow Up - Conducted via telephone due to current COVID-19 concerns for limiting patient exposure  I spoke with the patient to conduct this consult visit via telephone to spare the patient unnecessary potential exposure in the healthcare setting during the current COVID-19 pandemic. The patient was notified in advance and was offered a [place] meeting to allow for face to face communication but unfortunately reported that they did not have the appropriate resources/technology to support such a visit and instead preferred to proceed with a telephone visit.   Name: Victoria Coffey        MRN: [id number]  Date of Service: 11/26/2020 DOB: 1955-11-24  QH:[id number], Caren Griffins, MD  [name], MD     REFERRING PHYSICIAN: [name], MD   DIAGNOSIS: The encounter diagnosis was Malignant neoplasm of upper-outer quadrant of left breast in female, estrogen receptor positive ([place]).   HISTORY OF PRESENT ILLNESS: Victoria Coffey is a 65 y.o. female originally seen in the multidisciplinary breast clinic for a new diagnosis of left breast cancer. The patient was noted to have a history of left breast cancer in 1993. She had lumpectomy, radiotherapy and chemotherapy at that time. She presented with a rock hard left breast and skin changes of the left breast. Diagnostic imaging revealed a very firm and dense appearing breast with trabecular thickening and central mass up to 3.9 cm on mammography, but on ultrasound this was difficult to classify. There was thickening at 9:00 and her axilla was negative. On the right by mammogram, she had skin thickening along the areola and her ultrasound showed three separate 7-8 mm right axillary nodes. Biopsies on 03/01/20 that revead an invaisve ductal carcinoma of the left breast at 9:00 which was a grade 3. A right axillary node was sampled and was consistent with a grade 3 invasive ductal carcinoma, both  tumors were weakly ER positive at 10-20%, and her PR was negative, and HER2 was negative. Ki 67 was 40%. Functionally her prognostic markers are felt to be consistent with triple negative disease.  She did have staging CT scans that showed possible lesion in the liver, a bone scan only showed dental disease. An MRI of the breast showed masslike enhancement in the upper right breast and right axillary adenopathy, diffuse masslike enhancement and non-mass-like enhancement within the majority of the left breast was identified and 3 right abnormal appearing axillary lymph nodes were present.  An MRI of the abdomen showed no suspicious lesions, and she subsequently underwent neoadjuvant chemotherapy between 03/22/2020 and completed her treatment on 08/23/2020 with the addition of Keytruda. She underwent bilateral mastectomies on 10/01/2020 with right sentinel lymph node biopsy right targeted lymph node sampling and left modified radical mastectomy.  Final pathology revealed a 7 cm invasive ductal carcinoma of the left breast with clear margins but foci of skin involvement by tumor and nipple involvement of tumor.  1 left axillary lymph node was sampled and was negative for disease.  Her right mastectomy specimen right revealed an invasive ductal carcinoma greater than 5 cm negative margins and 4 of 6 sampled lymph nodes were positive in the right axilla.  She underwent right axillary dissection on 10/14/2020 and this showed 1 of 13 additional sampled lymph nodes having metastatic carcinoma within it.  In the midst of all of this she has continued Keytruda and oral capecitabine.  She is contacted today to discuss adjuvant radiotherapy.  Of note  our department is actively trying to obtain any prior records that may be available from 1993.    PREVIOUS RADIATION THERAPY: Yes  1993: The left breast was treated following [facility] at [facility]. She cannot recall who treated her but recalls about a 6 1/2 weeks.    PAST MEDICAL HISTORY:  Past Medical History:  Diagnosis Date  . Aortic atherosclerosis ([place])   . Breast cancer ([place]) dx'd 1993   left  . Fatty liver   . History of left breast cancer   . HIV infection ([place])   . Hypertension   . Port-A-Cath in place        PAST SURGICAL HISTORY: Past Surgical History:  Procedure Laterality Date  . AXILLARY LYMPH NODE DISSECTION Right 10/14/2020   Procedure: left axilla removal of two drains right axillary lymph node dissection;  Surgeon: [name], MD;  Location: [facility];  Service: General;  Laterality: Right;  PEC BLOCK, RNFA OR PA  . BREAST BIOPSY Bilateral 03/22/2020   Procedure: BILATERAL BREAST PUNCH BIOPSIES;  Surgeon: [name], MD;  Location: [place];  Service: General;  Laterality: Bilateral;  . BREAST LUMPECTOMY    . CESAREAN SECTION    . left breast lumpectomy  1993  . MASTECTOMY W/ SENTINEL NODE BIOPSY Bilateral 10/01/2020  . MASTECTOMY W/ SENTINEL NODE BIOPSY Bilateral 10/01/2020   Procedure: BILATERAL MASTECTOMIES WITH RIGHT SENTINEL LYMPH NODE BIOPSIES,RIGHT TARGETED RADIOACTIVE SEED LYMPH NODE, LEFT BREAST MODIFIED RADICAL MASTECTOMY ;  Surgeon: [name], MD;  Location: [place];  Service: General;  Laterality: Bilateral;  PEC BLOCK, RNFA  . PORTACATH PLACEMENT Right 03/22/2020   Procedure: INSERTION PORT-A-CATH WITH ULTRASOUND GUIDANCE;  Surgeon: [name], MD;  Location: [place];  Service: General;  Laterality: Right;  . RADIOACTIVE SEED GUIDED AXILLARY SENTINEL LYMPH NODE Right 10/01/2020   Procedure: RADIOACTIVE SEED GUIDED  TARGETED RIGHT SENTINEL LYMPH NODE BIOPSY;  Surgeon: [name], MD;  Location: [place];  Service: General;  Laterality: Right;  PEC BLOCK, RNFA     FAMILY HISTORY:  Family History  Problem Relation Age of Onset  . Hypertension Mother   . Congestive Heart Failure Mother   . Diabetes Mother   . Congestive Heart Failure Father   . Diabetes Sister   . Diabetes Brother   . Diabetes Brother       SOCIAL HISTORY:  reports that she has never smoked. She has never used smokeless tobacco. She reports current alcohol use. She reports that she does not use drugs. She is married and lives in [place]. 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 She is originally from [place] but has lived in [place] since the early 1980s.    ALLERGIES: Codeine, Other, Grapefruit bioflavonoid complex, Pomegranate [punica], and Shellfish allergy   MEDICATIONS:  Current Outpatient Medications  Medication Sig Dispense Refill  . acetaminophen (TYLENOL) 500 MG tablet Take 1,000 mg by mouth every 6 (six) hours as needed for moderate pain or headache.     . capecitabine (XELODA) 500 MG tablet Take 3 tablets (1,500 mg total) by mouth 2 (two) times daily after a meal. Take for 14 days on, 7 days off, repeat every 21 days. 84 tablet 1  . emtricitabine-rilpivir-tenofovir AF (ODEFSEY) 200-25-25 MG TABS tablet Take 1 tablet by mouth daily with breakfast. 30 tablet 11  . gabapentin (NEURONTIN) 100 MG capsule Take 1 capsule (100 mg total) by mouth 2 (two) times daily. 60 capsule 0  . hydrochlorothiazide (HYDRODIURIL) 25 MG tablet  TAKE 1 TABLET(25 MG) BY MOUTH DAILY 30 tablet 0  . lidocaine-prilocaine (EMLA) cream Apply 1 application topically See admin instructions. Apply 1-2 hours prior to access    . loratadine (CLARITIN) 10 MG tablet Take 10 mg by mouth See admin instructions. Takes 3-4 days before and after treatment    . potassium chloride SA (KLOR-CON) 20 MEQ tablet Take 1 tablet (20 mEq total) by mouth 2 (two) times daily. Take 1 tab twice daily for 3 days then 1 tab once daily (Patient taking differently: Take 20 mEq by mouth 2 (two) times daily.) 60 tablet 1  . traMADol (ULTRAM) 50 MG tablet Take 1 tablet (50 mg total) by mouth every 6 (six) hours as needed (mild pain). 30 tablet 1  . methocarbamol (ROBAXIN) 500 MG tablet Take 1 tablet (500 mg total) by mouth every 6 (six) hours as needed for  muscle spasms. (Patient not taking: Reported on 11/25/2020) 20 tablet 0  . oxyCODONE (OXY IR/ROXICODONE) 5 MG immediate release tablet Take 1-2 tablets (5-10 mg total) by mouth every 4 (four) hours as needed for moderate pain, severe pain or breakthrough pain. (Patient not taking: Reported on 11/25/2020) 10 tablet 0   No current facility-administered medications for this encounter.     REVIEW OF SYSTEMS: On review of systems, the patient reports that she has been doing relatively well overall since surgery, she does have a little bit of some fullness along the right underarm and plans to see physical therapy next week to discuss.  She reports as well that she has had some soreness in the midportion of the chest wall and is hoping that this will heal up nicely within the next few months.  She feels like she did tolerate chemotherapy despite some obstacles with neuropathy.  No other complaints are verbalized.     PHYSICAL EXAM:  Wt Readings from Last 3 Encounters:  11/14/20 130 lb 6.4 oz (59.1 kg)  10/24/20 130 lb 8 oz (59.2 kg)  10/14/20 129 lb 3.2 oz (58.6 kg)   Unable to assess due to encounter type.    ECOG = 1  0 - Asymptomatic (Fully active, able to carry on all predisease activities without restriction)  1 - Symptomatic but completely ambulatory (Restricted in physically strenuous activity but ambulatory and able to carry out work of a light or sedentary nature. For example, light housework, office work)  2 - Symptomatic, <50% in bed during the day (Ambulatory and capable of all self care but unable to carry out any work activities. Up and about more than 50% of waking hours)  3 - Symptomatic, >50% in bed, but not bedbound (Capable of only limited self-care, confined to bed or chair 50% or more of waking hours)  4 - Bedbound (Completely disabled. Cannot carry on any self-care. Totally confined to bed or chair)  5 - Death   Eustace Pen MM, Creech RH, Tormey DC, et al. [phone]). "Toxicity  and response criteria of the [facility] Group". [place] Oncol. 5 (6): 649-55    LABORATORY DATA:  Lab Results  Component Value Date   WBC 4.4 11/14/2020   HGB 10.4 (L) 11/14/2020   HCT 31.8 (L) 11/14/2020   MCV 93.5 11/14/2020   PLT 255 11/14/2020   Lab Results  Component Value Date   NA 137 11/14/2020   K 3.3 (L) 11/14/2020   CL 105 11/14/2020   CO2 23 11/14/2020   Lab Results  Component Value Date   ALT 50 (  H) 11/14/2020   AST 53 (H) 11/14/2020   ALKPHOS 347 (H) 11/14/2020   BILITOT 0.2 (L) 11/14/2020      RADIOGRAPHY: No results found.     IMPRESSION/PLAN: 1. 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 Dr. Lisbeth Renshaw discusses the pathology findings and reviews the nature of locally advanced synchronous breast cancer.  He discusses the patient's course as well since surgery and agrees with additional adjuvant therapy.  He discusses that he would like the opportunity to gather information from her tract prior treatment plan, and reviews that there are cases in which skin involvement at the time of surgery was felt to be a high-risk local risk for recurrence following mastectomy and in scenarios for which patients were retreated with radiotherapy.  Based on this we will hopefully gather a little bit more information outlining the extent to which she received her radiation to the left breast in 1993.  If her lymph nodes were not treated, Dr. Lisbeth Renshaw could extend her treatment to a more traditional postmastectomy course of 6-1/2 weeks however if it does appear that she has more of an extensive treatment plan he could offer her 5-1/2 weeks to the chest wall and one on the left side.  He would however recommend 6-1/2 weeks  to the right chest wall and regional lymph nodes. We discussed the risks, benefits, short, and long term effects of radiotherapy, as well as the curative intent, and the patient is interested in proceeding. Dr. Lisbeth Renshaw discusses the delivery and logistics of radiotherapy and anticipates a course of 6-1/2 weeks to the right chest wall and regional lymph nodes, and either 5-1/2 to 6-1/2 weeks of radiotherapy to the left chest wall plus or minus regional nodes depending on her prior treatment fields.  She will come in for simulation on Thursday of this week at which time she will sign written consent to proceed. 2 HIV. The patient continues under the care of [name] and remains with an undetectable viral load.  Dr. Burr Medico plans to follow this expectantly.   Given current concerns for patient exposure during the COVID-19 pandemic, this encounter was conducted via telephone.  The patient has provided two factor identification and has given verbal consent for this type of encounter and has been advised to only accept a meeting of this type in a secure network environment. The time spent during this encounter was 60 minutes including preparation, discussion, and coordination of the patient's care. The attendants for this meeting include Blenda Nicely, RN, Dr. Lisbeth Renshaw, Hayden Pedro  and Elsie Ra Hillock.  During the encounter,  Blenda Nicely, RN, Dr. Lisbeth Renshaw, and Hayden Pedro were located at [facility] Radiation Oncology Department.  Victoria Coffey was located at home.    The above documentation reflects my direct findings during this shared patient visit. Please see the separate note by Dr. Lisbeth Renshaw on this date for the remainder of the patient's plan of care.    Victoria Coffey, PAC

## 2020-11-26 NOTE — Telephone Encounter (Signed)
Scheduled appts per 1/24 los. Pt to get updated appt calendar at next visit per appt notes.

## 2020-11-28 ENCOUNTER — Other Ambulatory Visit: Payer: Self-pay

## 2020-11-28 ENCOUNTER — Ambulatory Visit
Admission: RE | Admit: 2020-11-28 | Discharge: 2020-11-28 | Disposition: A | Discharge status: Home / Self Care | Payer: BC Managed Care – PPO | Source: Ambulatory Visit | Attending: Radiation Oncology | Admitting: Radiation Oncology

## 2020-11-28 DIAGNOSIS — C50412 Malignant neoplasm of upper-outer quadrant of left female breast: Secondary | ICD-10-CM | POA: Insufficient documentation

## 2020-11-28 DIAGNOSIS — C50411 Malignant neoplasm of upper-outer quadrant of right female breast: Secondary | ICD-10-CM | POA: Insufficient documentation

## 2020-11-28 DIAGNOSIS — Z170 Estrogen receptor positive status [ER+]: Secondary | ICD-10-CM | POA: Diagnosis present

## 2020-11-29 ENCOUNTER — Ambulatory Visit: Payer: BC Managed Care – PPO | Admitting: Radiation Oncology

## 2020-11-29 ENCOUNTER — Other Ambulatory Visit: Payer: Self-pay | Admitting: Hematology

## 2020-11-29 DIAGNOSIS — C50412 Malignant neoplasm of upper-outer quadrant of left female breast: Secondary | ICD-10-CM

## 2020-11-29 DIAGNOSIS — Z170 Estrogen receptor positive status [ER+]: Secondary | ICD-10-CM

## 2020-11-29 MED ORDER — CAPECITABINE 500 MG PO TABS: ORAL_TABLET | ORAL | 1 refills | Status: DC

## 2020-12-02 ENCOUNTER — Ambulatory Visit: Payer: BC Managed Care – PPO

## 2020-12-02 ENCOUNTER — Encounter: Payer: Self-pay | Admitting: Internal Medicine

## 2020-12-02 ENCOUNTER — Other Ambulatory Visit: Payer: Self-pay

## 2020-12-02 ENCOUNTER — Other Ambulatory Visit: Payer: Self-pay | Admitting: Hematology

## 2020-12-02 ENCOUNTER — Telehealth: Payer: Self-pay

## 2020-12-02 ENCOUNTER — Ambulatory Visit (INDEPENDENT_AMBULATORY_CARE_PROVIDER_SITE_OTHER): Payer: BC Managed Care – PPO | Admitting: Internal Medicine

## 2020-12-02 VITALS — BP 144/87 | HR 97 | Temp 97.7°F | Ht 61.0 in | Wt 131.0 lb

## 2020-12-02 DIAGNOSIS — Z79899 Other long term (current) drug therapy: Secondary | ICD-10-CM

## 2020-12-02 DIAGNOSIS — B20 Human immunodeficiency virus [HIV] disease: Secondary | ICD-10-CM | POA: Diagnosis not present

## 2020-12-02 DIAGNOSIS — C50911 Malignant neoplasm of unspecified site of right female breast: Secondary | ICD-10-CM

## 2020-12-02 DIAGNOSIS — D702 Other drug-induced agranulocytosis: Secondary | ICD-10-CM

## 2020-12-02 DIAGNOSIS — I10 Essential (primary) hypertension: Secondary | ICD-10-CM

## 2020-12-02 DIAGNOSIS — E876 Hypokalemia: Secondary | ICD-10-CM

## 2020-12-02 NOTE — Progress Notes (Signed)
Victoria Coffey   [phone] [phone]   Clinic Follow up Note   Patient Care Team: [name], MD as PCP - General (Infectious Diseases) [name], MD as PCP - Infectious Diseases (Infectious Diseases) [name], MD as Consulting Physician (General Surgery) [name], MD as Consulting Physician (Hematology) [name], MD as Consulting Physician (Radiation Oncology) [name], RN as Oncology Nurse Navigator [name], RN as Oncology Nurse Navigator  Date of Service:  12/05/2020  CHIEF COMPLAINT: F/u ofbilateralbreast cancer  SUMMARY OF ONCOLOGIC HISTORY: Oncology History Overview Note  Cancer Staging Bilateral breast cancer [facility]) Staging form: Breast, AJCC 8th Edition - Pathologic stage from 10/01/2020: Stage IIIC (pT3, pN2a, cM0, G3, ER-, PR-, HER2-) - Signed by Alla Feeling, NP on 10/24/2020  Malignant neoplasm of upper-outer quadrant of left breast in female, estrogen receptor positive ([place]) Staging form: Breast, AJCC 8th Edition - Clinical stage from 03/01/2020: Stage IIIC (cT4, cN0, cM0, G3, ER+, PR-, HER2-) - Signed by [name], MD on 08/22/2020 - Pathologic stage from 10/01/2020: Stage IIIC (pT4b, pN0, cM0, G3, ER-, PR-, HER2-) - Signed by Alla Feeling, NP on 10/24/2020    Malignant neoplasm of upper-outer quadrant of left breast in female, estrogen receptor negative ([place])  02/28/2020 Mammogram   Diagnostic Mammogram 02/28/20  IMPRESSION The 3.9 cm ill defined mass in the left breast posterior depth central 4.3cm to the nipple seen on the mediolateral oblique view onlt with associated difssue skin thickening is highly suspicious for malignancy.    03/01/2020 Cancer Staging   Staging form: Breast, AJCC 8th Edition - Clinical stage from 03/01/2020: Stage IIIC (cT4, cN0, cM0, G3, ER+, PR-, HER2-) - Signed by [name], MD on 08/22/2020   03/01/2020 Initial Biopsy   Diagnosis 03/01/20  1. Breast, left,  needle core biopsy, 9 o'clock, 3-4cmfn - INVASIVE DUCTAL CARCINOMA. SEE NOTE 2. Lymph node, needle/core biopsy, right axilla - INVASIVE DUCTAL CARCINOMA. SEE NOTE Diagnosis Note 1. Invasive carcinoma measures 1.5 cm in greatest linear dimension and appears grade 3. Dr. [name] reviewed the case and concurs with the diagnosis. A breast prognostic profile (ER, PR, Ki-67 and HER2) is pending and will be reported in an addendum. Dr. [name] was notified on 03/04/2020. 2. Carcinoma measures 0.6 cm in greatest linear dimension and appears grade 3. Lymphoid tissue is not identified - this may represent a completely replaced lymph node. A breast prognostic profile (ER, PR, Ki-67 and HER2) is pending and will be reported in an addendum. Dr. [name] reviewed the case and concurs with the diagnosis. Dr. [name] was notified on 03/04/2020.   03/01/2020 Receptors her2   1. PROGNOSTIC INDICATORS Results: IMMUNOHISTOCHEMICAL AND MORPHOMETRIC ANALYSIS PERFORMED MANUALLY The tumor cells are NEGATIVE for Her2 (0). Estrogen Receptor: 20%, POSITIVE, WEAK STAINING INTENSITY Progesterone Receptor: 0%, NEGATIVE Proliferation Marker Ki67: 40% COMMENT: The negative hormone receptor study(ies) in this case has an internal positive control.    2. PROGNOSTIC INDICATORS Results: IMMUNOHISTOCHEMICAL AND MORPHOMETRIC ANALYSIS PERFORMED MANUALLY The tumor cells are NEGATIVE for Her2 (1+). Estrogen Receptor: 10%, POSITIVE, WEAK STAINING INTENSITY Progesterone Receptor: 0%, NEGATIVE Proliferation Marker Ki67: 40% COMMENT: The negative hormone receptor study(ies) in this case has no internal positive control.   03/05/2020 Initial Diagnosis   Malignant neoplasm of upper-outer quadrant of left breast in female, estrogen receptor positive ([place])   03/18/2020 Breast MRI   IMPRESSION: 1. 7 x 9 x 6 cm area of suspicious non masslike enhancement within the central/UPPER RIGHT breast  with anterior skin thickening.  Given biopsy-proven metastatic RIGHT axillary lymph node, tissue sampling of the anterior and posterior aspects of this non masslike RIGHT breast enhancement is recommended to exclude malignancy. 2. 7 x 6 x 5 cm diffuse masslike and nonmasslike enhancement within the majority of the remaining LEFT breast with diffuse LEFT breast skin thickening, compatible with malignancy. Abnormal enhancement is noted along the anterior aspect of the LEFT pectoralis muscle. 3. Three abnormal RIGHT axillary lymph nodes, 1 which is biopsy proven to be metastatic disease. No abnormal LEFT axillary lymph nodes identified.   03/20/2020 Echocardiogram   Baseline ECHO  IMPRESSIONS     1. The patient was reported to be in pain during the study and not able  to participate in this study. All that can be said is that the LV and RV  function are grossly normal. Would recommend to repeat this study when/if  the patient is able to tolerate  the exam.   2. Left ventricular ejection fraction, by estimation, is 60 to 65%. The  left ventricle has normal function. Left ventricular endocardial border  not optimally defined to evaluate regional wall motion. Left ventricular  diastolic function could not be  evaluated.   3. Right ventricular systolic function is normal. The right ventricular  size is normal.   4. The mitral valve was not well visualized. No evidence of mitral valve  regurgitation.   5. The aortic valve was not well visualized. Aortic valve regurgitation  is not visualized.   6. The inferior vena cava is normal in size with greater than 50%  respiratory variability, suggesting right atrial pressure of 3 mmHg.    03/20/2020 Imaging   CT CAP W contrast  IMPRESSION: 1. Small right axillary/subpectoral lymph nodes. Difficult to exclude metastatic disease. 2. Subtle 6 mm low-attenuation lesion in the dome of the liver, not well seen previously. Lesion is too small to characterize. Further evaluation  could be performed with MR abdomen without and with contrast, as clinically indicated. 3. Hepatic steatosis. 4. Aortic atherosclerosis (ICD10-I70.0). Coronary artery calcification.     03/20/2020 Imaging   Whole body bone scan  IMPRESSION: 1. Increased activity noted over the right breast, possibly related to the patient's right breast cancer.   2. Punctate area of increased activity over the left maxillary region, most likely related to sinus disease or dental disease. Increased activity noted over the left mandible also possibly related to dental disease. No other focal bony abnormalities to suggest metastatic disease identified.   03/22/2020 Procedure   PAC placement by Dr [name]    03/22/2020 - 08/23/2020 Chemotherapy   AC q2weeks for 4 cycles 03/22/20-05/03/20 followed by weekly Taxol and Carboplatin q3weeks for 12 weeks starting 05/17/20.                  Week 2 Taxol reduced to half dose and CT was dose reduced starting with week 4 due to thrombocytopenia. Given pancytopenia, will change Carboplatin to 50% dose weekly with Taxol 22m/m2 starting with week 7 (06/13/20). Will hold Carboplatin as needed based on labs. Carboplatin increased to 80% starting with week 8. Carboplatin dose reduced for C11 and held with C12. C12 Taxol postponed to 08/23/20   03/22/2020 Pathology Results   FINAL MICROSCOPIC DIAGNOSIS:   A. BREAST, RIGHT, PUNCH BIOPSY:  - Carcinoma involving dermal lymphatics.  - See comment.   B. BREAST, LEFT, PUNCH BIOPSY:  - Carcinoma involving dermal lymphatics.  - See comment.   COMMENT:  The  morphology is compatible with ductal breast carcinoma.    ADDENDUM:   PROGNOSTIC INDICATOR RESULTS:   Immunohistochemical and morphometric analysis performed manually   The tumor cells are NEGATIVE for Her2 (0%).   Estrogen Receptor:       NEGATIVE, 0%  Progesterone Receptor:   NEGATIVE, 0%    08/13/2020 Breast MRI   IMPRESSION: 1. Interval significant response to  treatment of the diffuse non-mass enhancement involving the UPPER RIGHT breast. There is a solitary residual 5 mm indeterminate mass in the UPPER OUTER QUADRANT at MIDDLE depth with plateau kinetics. 2. Minimal response to treatment of the diffuse non-mass enhancement throughout the LEFT breast. 3. Resolution of enhancement of the skin and nipple-areolar complex of the RIGHT breast. 4. Near complete resolution of enhancement of the skin and nipple-areolar complex of the LEFT breast. Minimal residual nipple-areola complex enhancement persists. 5. The 3 previously identified pathologic RIGHT axillary lymph nodes are now normal in appearance. There is no pathologic lymphadenopathy currently.   08/23/2020 -  Chemotherapy   Immunotherapy Keytruda q3weeks starting 08/23/20 for 1 year treatment.    10/01/2020 Surgery   MASTECTOMIES WITH RIGHT SENTINEL LYMPH NODE BIOPSIES,RIGHT TARGETED RADIOACTIVE SEED LYMPH NODE, LEFT BREAST MODIFIED RADICAL MASTECTOMY  and RADIOACTIVE SEED GUIDED  TARGETED RIGHT SENTINEL LYMPH NODE BIOPSY by Dr [name]     10/01/2020 Pathology Results   FINAL MICROSCOPIC DIAGNOSIS:   A. BREAST, LEFT, MASTECTOMY:  - Invasive ductal carcinoma, 7 cm.  - Anterior and posterior soft tissue margins not involved by tumor.  - Foci of skin involvement by tumor, see comment.  - Nipple involvement by tumor.  - See oncology table and comment.   B. LYMPH NODE, LEFT AXILLARY, CONTENTS:  - One lymph node with no metastatic carcinoma (0/1).   C. BREAST, RIGHT, MASTECTOMY:  - Invasive ductal carcinoma, greater than 5 cm.  - Margins not involved.  - Subareolar breast tissue involved by tumor.  - See oncology table and comment.   D. LYMPH NODE, RIGHT #2, SENTINEL, EXCISION:  - Metastatic carcinoma in one lymph node, 0.5 cm (1/1).   E. LYMPH NODE, RIGHT #1, SENTINEL, EXCISION:  - Metastatic carcinoma in one lymph node, 1.5 cm (1/1).  - Biopsy site and biopsy clip.   F. LYMPH  NODE, RIGHT #3, SENTINEL, EXCISION:  - Metastatic carcinoma in one lymph node, 1.5 cm (1/1).   G. LYMPH NODE, RIGHT #4, SENTINEL, EXCISION:  - One lymph node with no metastatic carcinoma (0/1).   H. LYMPH NODE, RIGHT #5, SENTINEL, EXCISION:  - Metastatic carcinoma in one lymph node, 0.5 cm (1/1).   I. LYMPH NODE, RIGHT #6, SENTINEL, EXCISION:  - One lymph node with no metastatic carcinoma (0/1).    10/01/2020 Cancer Staging   Staging form: Breast, AJCC 8th Edition - Pathologic stage from 10/01/2020: Stage IIIC (pT4b, pN0, cM0, G3, ER-, PR-, HER2-) - Signed by Alla Feeling, NP on 10/24/2020   10/14/2020 Surgery   RIGHT AXILLARY LYMPH NODE DISSECTION by Dr [name]  -1 /13 positive LN for metastatic carcinoma    11/18/2020 -  Chemotherapy   Adjuvant Xeloda 1500 mg BID days 1-14 q21 days x6 months, starting 11/18/2020 (will reduce to 1500 mg Am/1000 mg Pm M-F during radiation)      CURRENT THERAPY:  1. Keytruda q3weeks starting 08/23/20 for 1 year treatment. 2. Adjuvant Xeloda 1500 mg BID days 1-14 q21 days x6 months, starting 11/18/2020 (will reduce to 1500 mg Am/1000 mg Pm M-F during radiation)  INTERVAL HISTORY:  Victoria Coffey is here for a follow up. She presents to the clinic alone. She tolerated the first cycle Xeloda well, no noticeable side effects except mild skin erythema in palms, no tingling or sensitivity  She has recovered well from surgery, she has good appetite and energy level   All other systems were reviewed with the patient and are negative.  MEDICAL HISTORY:  Past Medical History:  Diagnosis Date  . Aortic atherosclerosis ([place])   . Breast cancer ([place]) dx'd 1993   left  . Fatty liver   . History of left breast cancer   . HIV infection ([place])   . Hypertension   . Port-A-Cath in place     SURGICAL HISTORY: Past Surgical History:  Procedure Laterality Date  . AXILLARY LYMPH NODE DISSECTION Right 10/14/2020   Procedure: left axilla removal of two  drains right axillary lymph node dissection;  Surgeon: [name], MD;  Location: [facility];  Service: General;  Laterality: Right;  PEC BLOCK, RNFA OR PA  . BREAST BIOPSY Bilateral 03/22/2020   Procedure: BILATERAL BREAST PUNCH BIOPSIES;  Surgeon: [name], MD;  Location: [facility];  Service: General;  Laterality: Bilateral;  . BREAST LUMPECTOMY    . CESAREAN SECTION    . left breast lumpectomy  1993  . MASTECTOMY W/ SENTINEL NODE BIOPSY Bilateral 10/01/2020  . MASTECTOMY W/ SENTINEL NODE BIOPSY Bilateral 10/01/2020   Procedure: BILATERAL MASTECTOMIES WITH RIGHT SENTINEL LYMPH NODE BIOPSIES,RIGHT TARGETED RADIOACTIVE SEED LYMPH NODE, LEFT BREAST MODIFIED RADICAL MASTECTOMY ;  Surgeon: [name], MD;  Location: [place];  Service: General;  Laterality: Bilateral;  PEC BLOCK, RNFA  . PORTACATH PLACEMENT Right 03/22/2020   Procedure: INSERTION PORT-A-CATH WITH ULTRASOUND GUIDANCE;  Surgeon: [name], MD;  Location: [place];  Service: General;  Laterality: Right;  . RADIOACTIVE SEED GUIDED AXILLARY SENTINEL LYMPH NODE Right 10/01/2020   Procedure: RADIOACTIVE SEED GUIDED  TARGETED RIGHT SENTINEL LYMPH NODE BIOPSY;  Surgeon: [name], MD;  Location: [facility];  Service: General;  Laterality: Right;  PEC BLOCK, RNFA    I have reviewed the social history and family history with the patient and they are unchanged from previous note.  ALLERGIES:  is allergic to codeine, other, grapefruit bioflavonoid complex, pomegranate [punica], and shellfish allergy.  MEDICATIONS:  Current Outpatient Medications  Medication Sig Dispense Refill  . acetaminophen (TYLENOL) 500 MG tablet Take 1,000 mg by mouth every 6 (six) hours as needed for moderate pain or headache.     . capecitabine (XELODA) 500 MG tablet Take 3 tabs in morning and 2 tabs in evening, on days of radiation, Monday through Friday. 75 tablet 1  . emtricitabine-rilpivir-tenofovir AF (ODEFSEY) 200-25-25 MG TABS tablet Take 1 tablet by mouth daily  with breakfast. 30 tablet 11  . gabapentin (NEURONTIN) 100 MG capsule Take 1 capsule (100 mg total) by mouth 2 (two) times daily. (Patient not taking: Reported on 12/02/2020) 60 capsule 0  . hydrochlorothiazide (HYDRODIURIL) 25 MG tablet TAKE 1 TABLET(25 MG) BY MOUTH DAILY 30 tablet 0  . lidocaine-prilocaine (EMLA) cream Apply 1 application topically See admin instructions. Apply 1-2 hours prior to access    . loratadine (CLARITIN) 10 MG tablet Take 10 mg by mouth See admin instructions. Takes 3-4 days before and after treatment    . methocarbamol (ROBAXIN) 500 MG tablet Take 1 tablet (500 mg total) by mouth every 6 (six) hours as needed for muscle spasms. (Patient not taking: No sig reported) 20 tablet 0  .  oxyCODONE (OXY IR/ROXICODONE) 5 MG immediate release tablet Take 1-2 tablets (5-10 mg total) by mouth every 4 (four) hours as needed for moderate pain, severe pain or breakthrough pain. (Patient not taking: No sig reported) 10 tablet 0  . potassium chloride SA (KLOR-CON) 20 MEQ tablet Take 1 tablet (20 mEq total) by mouth 2 (two) times daily. Take 1 tab twice daily for 3 days then 1 tab once daily (Patient taking differently: Take 20 mEq by mouth 2 (two) times daily.) 60 tablet 1  . traMADol (ULTRAM) 50 MG tablet Take 1 tablet (50 mg total) by mouth every 6 (six) hours as needed (mild pain). 30 tablet 1   No current facility-administered medications for this visit.    PHYSICAL EXAMINATION: ECOG PERFORMANCE STATUS: 1 - Symptomatic but completely ambulatory  Vitals:   12/05/20 0850  BP: (!) 140/92  Pulse: 79  Resp: 14  Temp: (!) 97.3 F (36.3 C)  SpO2: 100%   Filed Weights   12/05/20 0850  Weight: 131 lb 1.6 oz (59.5 kg)    GENERAL:alert, no distress and comfortable SKIN: skin color, texture, turgor are normal, no rashes or significant lesions Musculoskeletal:no cyanosis of digits and no clubbing  NEURO: alert & oriented x 3 with fluent speech, no focal motor/sensory  deficits  LABORATORY DATA:  I have reviewed the data as listed CBC Latest Ref Rng & Units 12/05/2020 11/14/2020 10/24/2020  WBC 4.0 - 10.5 K/uL 3.7(L) 4.4 4.2  Hemoglobin 12.0 - 15.0 g/dL 10.6(L) 10.4(L) 9.4(L)  Hematocrit 36.0 - 46.0 % 31.5(L) 31.8(L) 28.7(L)  Platelets 150 - 400 K/uL 233 255 244     CMP Latest Ref Rng & Units 11/14/2020 10/24/2020 10/14/2020  Glucose 70 - 99 mg/dL 100(H) 101(H) 99  BUN 8 - 23 mg/dL $Remove'13 15 19  'xfzEDUh$ Creatinine 0.44 - 1.00 mg/dL 0.93 0.85 1.07(H)  Sodium 135 - 145 mmol/L 137 137 142  Potassium 3.5 - 5.1 mmol/L 3.3(L) 3.4(L) 3.9  Chloride 98 - 111 mmol/L 105 105 108  CO2 22 - 32 mmol/L $RemoveB'23 23 25  'UxDOCUeM$ Calcium 8.9 - 10.3 mg/dL 9.3 9.0 8.7(L)  Total Protein 6.5 - 8.1 g/dL 7.5 7.0 6.4(L)  Total Bilirubin 0.3 - 1.2 mg/dL 0.2(L) <0.2(L) 0.3  Alkaline Phos 38 - 126 U/L 347(H) 210(H) 159(H)  AST 15 - 41 U/L 53(H) 36 30  ALT 0 - 44 U/L 50(H) 29 23      RADIOGRAPHIC STUDIES: I have personally reviewed the radiological images as listed and agreed with the findings in the report. No results found.   ASSESSMENT & PLAN:  Victoria Coffey is a 65 y.o. 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 -Further work up showed 9cm nodular area in right breast and skin biopsy confirmed cancer involvement -Staging scans were negative for distant metastasis -She completed AC X4 and weekly TC 03/22/20-08/23/20.  -Given recentpublish Keynote 522 trial data,I started her on Immunotherapy Keytruda q3weeks for 1 year of treatment beginning 08/23/20. -She underwent b/l mastectomy with Dr [name] on 10/01/20. She also underwent right  axillary lymph node dissection on 10/14/2020.  -Due toher significant residual disease in LNs, I started her on adjuvant Xeloda 1500 mg twice daily, 2 weeks on and one week off ($RemoveB'1500mg'LRrZgeBH$  am, $Re'1000mg'Ilb$  pm M-F when she is  on radiation) for 6 months on 11/18/20 to reduce her high risk of recurrence after surgery.  -she is scheduled to start adjuvant radiation to right chest wall and axilla next Monday 12/09/20 with Dr Lisbeth Renshaw.  -will change Xeloda to $RemoveB'1500mg'tvMppDCg$  am and $Remo'1000mg'yDZJh$  pm on day of radiation (Monday-Friday), I reviewed with pt today  -plan to monitor her lab weekly and see her every 1-2 weeks when she is on radiation  -She will continue Keytruda every 3 weeks to complete 1 year treatment. -next f/u in 2/14    2.anemia  -secondary to chemo -stable overall, monitoring  3. H/o Left breast cancer, Dx in 1993s/p lumpectomy, radiation, chemo, Tamoxifen.Genetic Testinghas not been done yet.  4. Comorbidities: HIV(+)Dx 1993, HTN -Her HIV has been undetectable and well controlled. She is currently being seen by ID Dr Graylon Good -will refer her for Evusheld injection for COVID prevention, discussed with [name], she is interested   5. Hypokalemia -continue KCL -K 3.3 today, will increase to 66meq twice daily   6.  Mild transaminitis -We will monitor   PLAN: -will proceed Keytruda today and continue very 3 weeks -start concurrent chemoRT next Monday 2/7, with Xeloda $RemoveBef'1500mg'HXsZzhwLPw$  am and $Remo'1000mg'eWIXq$  pm Mondays through Fridays  -weekly lab, next f/u on 2/14  -referral for Evusheld injection    No problem-specific Assessment & Plan notes found for this encounter.   No orders of the defined types were placed in this encounter.  All questions were answered. The patient knows to call the clinic with any problems, questions or concerns. No barriers to learning was detected. The total time spent in the appointment was 30 minutes.     [name], MD 12/05/2020   I, Joslyn Devon, am acting as scribe  for [name], MD.   I have reviewed the above documentation for accuracy and completeness, and I agree with the above.

## 2020-12-02 NOTE — Telephone Encounter (Signed)
RN gave patient signed lab orders so that she can get her labs done at the [facility]. Advised patient to call with questions.   Beryle Flock, RN

## 2020-12-03 ENCOUNTER — Ambulatory Visit: Payer: BC Managed Care – PPO

## 2020-12-03 MED FILL — CAPECITABINE 500 MG TABLET: 500 | 21 days supply | Qty: 75 | Fill #0

## 2020-12-04 ENCOUNTER — Ambulatory Visit: Payer: BC Managed Care – PPO

## 2020-12-04 ENCOUNTER — Ambulatory Visit: Payer: BC Managed Care – PPO | Attending: Hematology

## 2020-12-04 DIAGNOSIS — C50911 Malignant neoplasm of unspecified site of right female breast: Secondary | ICD-10-CM | POA: Insufficient documentation

## 2020-12-04 DIAGNOSIS — R293 Abnormal posture: Secondary | ICD-10-CM | POA: Insufficient documentation

## 2020-12-04 DIAGNOSIS — R600 Localized edema: Secondary | ICD-10-CM | POA: Insufficient documentation

## 2020-12-04 DIAGNOSIS — C50912 Malignant neoplasm of unspecified site of left female breast: Secondary | ICD-10-CM | POA: Insufficient documentation

## 2020-12-04 DIAGNOSIS — C50412 Malignant neoplasm of upper-outer quadrant of left female breast: Secondary | ICD-10-CM | POA: Insufficient documentation

## 2020-12-04 DIAGNOSIS — Z170 Estrogen receptor positive status [ER+]: Secondary | ICD-10-CM | POA: Insufficient documentation

## 2020-12-05 ENCOUNTER — Encounter: Payer: Self-pay | Admitting: Hematology

## 2020-12-05 ENCOUNTER — Ambulatory Visit: Payer: BC Managed Care – PPO

## 2020-12-05 ENCOUNTER — Other Ambulatory Visit: Payer: Self-pay

## 2020-12-05 ENCOUNTER — Inpatient Hospital Stay: Payer: BC Managed Care – PPO | Attending: Hematology

## 2020-12-05 ENCOUNTER — Inpatient Hospital Stay: Payer: BC Managed Care – PPO

## 2020-12-05 ENCOUNTER — Inpatient Hospital Stay: Payer: BC Managed Care – PPO | Admitting: Hematology

## 2020-12-05 ENCOUNTER — Encounter: Payer: Self-pay | Admitting: *Deleted

## 2020-12-05 VITALS — BP 140/92 | HR 79 | Temp 97.3°F | Resp 14 | Ht 61.0 in | Wt 131.1 lb

## 2020-12-05 DIAGNOSIS — C50911 Malignant neoplasm of unspecified site of right female breast: Secondary | ICD-10-CM

## 2020-12-05 DIAGNOSIS — R600 Localized edema: Secondary | ICD-10-CM | POA: Diagnosis present

## 2020-12-05 DIAGNOSIS — Z9013 Acquired absence of bilateral breasts and nipples: Secondary | ICD-10-CM | POA: Diagnosis not present

## 2020-12-05 DIAGNOSIS — E876 Hypokalemia: Secondary | ICD-10-CM | POA: Insufficient documentation

## 2020-12-05 DIAGNOSIS — C50912 Malignant neoplasm of unspecified site of left female breast: Secondary | ICD-10-CM | POA: Diagnosis present

## 2020-12-05 DIAGNOSIS — C50412 Malignant neoplasm of upper-outer quadrant of left female breast: Secondary | ICD-10-CM | POA: Diagnosis present

## 2020-12-05 DIAGNOSIS — C50411 Malignant neoplasm of upper-outer quadrant of right female breast: Secondary | ICD-10-CM | POA: Diagnosis not present

## 2020-12-05 DIAGNOSIS — D6481 Anemia due to antineoplastic chemotherapy: Secondary | ICD-10-CM | POA: Diagnosis not present

## 2020-12-05 DIAGNOSIS — Z171 Estrogen receptor negative status [ER-]: Secondary | ICD-10-CM | POA: Diagnosis not present

## 2020-12-05 DIAGNOSIS — Z170 Estrogen receptor positive status [ER+]: Secondary | ICD-10-CM | POA: Diagnosis not present

## 2020-12-05 DIAGNOSIS — Z21 Asymptomatic human immunodeficiency virus [HIV] infection status: Secondary | ICD-10-CM | POA: Diagnosis not present

## 2020-12-05 DIAGNOSIS — R7401 Elevation of levels of liver transaminase levels: Secondary | ICD-10-CM | POA: Diagnosis not present

## 2020-12-05 DIAGNOSIS — Z923 Personal history of irradiation: Secondary | ICD-10-CM | POA: Insufficient documentation

## 2020-12-05 DIAGNOSIS — Z5112 Encounter for antineoplastic immunotherapy: Secondary | ICD-10-CM | POA: Insufficient documentation

## 2020-12-05 DIAGNOSIS — I10 Essential (primary) hypertension: Secondary | ICD-10-CM | POA: Diagnosis not present

## 2020-12-05 DIAGNOSIS — R293 Abnormal posture: Secondary | ICD-10-CM

## 2020-12-05 LAB — CBC WITH DIFFERENTIAL (CANCER CENTER ONLY)
Abs Immature Granulocytes: 0.01 10*3/uL (ref 0.00–0.07)
Basophils Absolute: 0 10*3/uL (ref 0.0–0.1)
Basophils Relative: 0 %
Eosinophils Absolute: 0.1 10*3/uL (ref 0.0–0.5)
Eosinophils Relative: 1 %
HCT: 31.5 % — ABNORMAL LOW (ref 36.0–46.0)
Hemoglobin: 10.6 g/dL — ABNORMAL LOW (ref 12.0–15.0)
Immature Granulocytes: 0 %
Lymphocytes Relative: 32 %
Lymphs Abs: 1.2 10*3/uL (ref 0.7–4.0)
MCH: 30.6 pg (ref 26.0–34.0)
MCHC: 33.7 g/dL (ref 30.0–36.0)
MCV: 91 fL (ref 80.0–100.0)
Monocytes Absolute: 0.3 10*3/uL (ref 0.1–1.0)
Monocytes Relative: 9 %
Neutro Abs: 2.1 10*3/uL (ref 1.7–7.7)
Neutrophils Relative %: 58 %
Platelet Count: 233 10*3/uL (ref 150–400)
RBC: 3.46 MIL/uL — ABNORMAL LOW (ref 3.87–5.11)
RDW: 14.1 % (ref 11.5–15.5)
WBC Count: 3.7 10*3/uL — ABNORMAL LOW (ref 4.0–10.5)
nRBC: 0 % (ref 0.0–0.2)

## 2020-12-05 LAB — CMP (CANCER CENTER ONLY)
ALT: 61 U/L — ABNORMAL HIGH (ref 0–44)
AST: 54 U/L — ABNORMAL HIGH (ref 15–41)
Albumin: 3.6 g/dL (ref 3.5–5.0)
Alkaline Phosphatase: 444 U/L — ABNORMAL HIGH (ref 38–126)
Anion gap: 9 (ref 5–15)
BUN: 20 mg/dL (ref 8–23)
CO2: 26 mmol/L (ref 22–32)
Calcium: 9.1 mg/dL (ref 8.9–10.3)
Chloride: 104 mmol/L (ref 98–111)
Creatinine: 0.9 mg/dL (ref 0.44–1.00)
GFR, Estimated: >60 mL/min (ref >60)
Glucose, Bld: 100 mg/dL — ABNORMAL HIGH (ref 70–99)
Potassium: 3.1 mmol/L — ABNORMAL LOW (ref 3.5–5.1)
Sodium: 139 mmol/L (ref 135–145)
Total Bilirubin: 0.3 mg/dL (ref 0.3–1.2)
Total Protein: 7.3 g/dL (ref 6.5–8.1)

## 2020-12-05 MED ORDER — SODIUM CHLORIDE 0.9 % IV SOLN
Freq: Once | INTRAVENOUS | Status: AC
  Filled 2020-12-05: qty 250

## 2020-12-05 MED ORDER — SODIUM CHLORIDE 0.9% FLUSH
10.0000 mL | INTRAVENOUS | Status: DC | PRN
  Administered 2020-12-05: 10 mL
  Filled 2020-12-05: qty 10

## 2020-12-05 MED ORDER — PEMBROLIZUMAB CHEMO INJECTION 100 MG/4ML
200.0000 mg | Freq: Once | INTRAVENOUS | Status: AC
  Administered 2020-12-05: 200 mg via INTRAVENOUS
  Filled 2020-12-05: qty 8

## 2020-12-05 MED ORDER — POTASSIUM CHLORIDE CRYS ER 20 MEQ PO TBCR: 20.0000 meq | EXTENDED_RELEASE_TABLET | Freq: Two times a day (BID) | ORAL | 1 refills | Status: DC

## 2020-12-05 MED ORDER — HEPARIN SOD (PORK) LOCK FLUSH 100 UNIT/ML IV SOLN
500.0000 [IU] | Freq: Once | INTRAVENOUS | Status: AC | PRN
  Administered 2020-12-05: 500 [IU]
  Filled 2020-12-05: qty 5

## 2020-12-05 NOTE — Therapy (Signed)
[place], [place], [address] Phone: [phone]   Fax:  [phone]  Physical Therapy Evaluation  Patient Details  Name: Victoria Coffey MRN: [id number] Date of Birth: 10-Sep-1956 Referring Provider (PT): Dr Burr Medico   Encounter Date: 12/05/2020   PT End of Session - 12/05/20 1818    Visit Number 1    Number of Visits 13    Date for PT Re-Evaluation 01/16/21    PT Start Time 0100    PT Stop Time 1450    PT Time Calculation (min) 54 min    Activity Tolerance Patient tolerated treatment well    Behavior During Therapy [facility] for tasks assessed/performed           Past Medical History:  Diagnosis Date  . Aortic atherosclerosis ([place])   . Breast cancer ([place]) dx'd 1993   left  . Fatty liver   . History of left breast cancer   . HIV infection ([place])   . Hypertension   . Port-A-Cath in place     Past Surgical History:  Procedure Laterality Date  . AXILLARY LYMPH NODE DISSECTION Right 10/14/2020   Procedure: left axilla removal of two drains right axillary lymph node dissection;  Surgeon: [name], MD;  Location: [facility];  Service: General;  Laterality: Right;  PEC BLOCK, RNFA OR PA  . BREAST BIOPSY Bilateral 03/22/2020   Procedure: BILATERAL BREAST PUNCH BIOPSIES;  Surgeon: [name], MD;  Location: [place];  Service: General;  Laterality: Bilateral;  . BREAST LUMPECTOMY    . CESAREAN SECTION    . left breast lumpectomy  1993  . MASTECTOMY W/ SENTINEL NODE BIOPSY Bilateral 10/01/2020  . MASTECTOMY W/ SENTINEL NODE BIOPSY Bilateral 10/01/2020   Procedure: BILATERAL MASTECTOMIES WITH RIGHT SENTINEL LYMPH NODE BIOPSIES,RIGHT TARGETED RADIOACTIVE SEED LYMPH NODE, LEFT BREAST MODIFIED RADICAL MASTECTOMY ;  Surgeon: [name], MD;  Location: [place];  Service: General;  Laterality: Bilateral;  PEC BLOCK, RNFA  . PORTACATH PLACEMENT Right 03/22/2020   Procedure: INSERTION PORT-A-CATH WITH ULTRASOUND  GUIDANCE;  Surgeon: [name], MD;  Location: [place];  Service: General;  Laterality: Right;  . RADIOACTIVE SEED GUIDED AXILLARY SENTINEL LYMPH NODE Right 10/01/2020   Procedure: RADIOACTIVE SEED GUIDED  TARGETED RIGHT SENTINEL LYMPH NODE BIOPSY;  Surgeon: [name], MD;  Location: [place];  Service: General;  Laterality: Right;  PEC BLOCK, RNFA    There were no vitals filed for this visit.    Subjective Assessment - 12/05/20 1356    Subjective Pt wants to know how to move forward during the healing process so as not to cause unnecessary strain. She has been doing some walking but has not done any upper body exs.  Able to reach into cabinets some.  Takes Keytruda and  Xeloda. No complaints of pain.Has some swelling at chest wall and lateral trunk mainly on the right.    Pertinent History Patient was diagnosed on 02/28/2020 with left invasive ductal carcinoma breast cancer. The mass measured 3.9 cm and was located in the upper outer quadrant. It was ER positive, PR negative, and HER2 negative with a Ki67 of 40%. Her left breast appeared to be retracted with significant skin changes. She also had 3 abnormal appearing nodes in the RIGHT axilla and 1 was biopsied and found to be positive for carcinoma. She has a history of left breast cancer in 1993 when she underwent a left lumpectomy, ALND, chemotherapy and radiation. For most recent diagnosis She  had neoadjuvant chemotherapy 03/22/20-08/23/20.Pt is now s/p Left breast modified radical mastectomy and right mastectomy with 3+ LN of 6 removed on  10/01/20.  On 10/14/20 she underwent a right ALND. She is presently undergoing radiation.    Patient Stated Goals reduce lymphedema risk, and learn shoulder ROM and strengthening exs and safe way to progress, Decrease swelling    Currently in Pain? No/denies    Pain Score 0-No pain              [facility] PT Assessment - 12/05/20 0001      Assessment   Medical Diagnosis bilateral Breast CA    Referring  Provider (PT) Dr Burr Medico    Onset Date/Surgical Date 02/28/20    Hand Dominance Right      Precautions   Precautions Other (comment)    Precaution Comments Lymphedema risk bilaterally, HIV, aortic atherosclerosis      Restrictions   Weight Bearing Restrictions No      Balance Screen   Has the patient fallen in the past 6 months No    Has the patient had a decrease in activity level because of a fear of falling?  No    Is the patient reluctant to leave their home because of a fear of falling?  No      Home Social worker Private residence    Living Arrangements Spouse/significant other;Children   Husband and adult daughter   Available Help at Discharge Family      Prior Function   Level of Independence Independent    Vocation Part time employment    Vocation Requirements [place] work for her and her [place]   Overall Cognitive Status Within Functional Limits for tasks assessed      Observation/Other Assessments   Observations visible swelling at right greater than left axillary region,central chest.  Mild cording noted bilateral axillary regions    Skin Integrity steri strips present over incisions      Posture/Postural Control   Posture/Postural Control Postural limitations    Postural Limitations Rounded Shoulders;Forward head      AROM   Right Shoulder Extension 55 Degrees    Right Shoulder Flexion 148 Degrees    Right Shoulder ABduction 148 Degrees    Right Shoulder Internal Rotation 70 Degrees    Right Shoulder External Rotation 95 Degrees    Left Shoulder Extension 60 Degrees    Left Shoulder Flexion 145 Degrees    Left Shoulder ABduction 148 Degrees    Left Shoulder Internal Rotation 70 Degrees    Left Shoulder External Rotation 85 Degrees             LYMPHEDEMA/ONCOLOGY QUESTIONNAIRE - 12/05/20 0001      Type   Cancer Type bilateral Breast CA      Surgeries   Mastectomy Date 10/01/20    Other Surgery Date  10/14/20   Right ALND   Number Lymph Nodes Removed 13   right     Treatment   Active Chemotherapy Treatment No    Past Chemotherapy Treatment Yes    Date 08/23/20    Active Radiation Treatment Yes    Past Radiation Treatment Yes    Current Hormone Treatment No    Past Hormone Therapy No      What other symptoms do you have   Are you Having Heaviness or Tightness Yes    Are you having Pain No    Are you having pitting edema  No    Is it Hard or Difficult finding clothes that fit No    Do you have infections No      Right Upper Extremity Lymphedema   At Axilla  31.8 cm    15 cm Proximal to Olecranon Process 30.7 cm    10 cm Proximal to Olecranon Process 30.2 cm    Olecranon Process 25.3 cm    15 cm Proximal to Ulnar Styloid Process 24.2 cm    10 cm Proximal to Ulnar Styloid Process 18.9 cm    Just Proximal to Ulnar Styloid Process 16.5 cm    Across Hand at [employer] 19.1 cm    At [place] of 2nd Digit 6.3 cm      Left Upper Extremity Lymphedema   At Axilla  31.4 cm    15 cm Proximal to Olecranon Process 29.2 cm    10 cm Proximal to Olecranon Process 28.7 cm    Olecranon Process 24.5 cm    15 cm Proximal to Ulnar Styloid Process 21.8 cm    10 cm Proximal to Ulnar Styloid Process 18.2 cm    Just Proximal to Ulnar Styloid Process 14.7 cm    Across Hand at [employer] 19 cm    At [place] of 2nd Digit 6.1 cm                 Quick Dash - 12/05/20 0001    Open a tight or new jar Mild difficulty    Do heavy household chores (wash walls, wash floors) No difficulty    Carry a shopping bag or briefcase No difficulty    Wash your back No difficulty    Use a knife to cut food No difficulty    Recreational activities in which you take some force or impact through your arm, shoulder, or hand (golf, hammering, tennis) No difficulty    During the past week, to what extent has your arm, shoulder or hand problem interfered with your normal social activities with family, friends,  neighbors, or groups? Not at all    During the past week, to what extent has your arm, shoulder or hand problem limited your work or other regular daily activities Not at all    Arm, shoulder, or hand pain. None    Tingling (pins and needles) in your arm, shoulder, or hand None    Difficulty Sleeping No difficulty    DASH Score 2.27 %            Objective measurements completed on examination: See above findings.                    PT Long Term Goals - 12/05/20 1835      PT LONG TERM GOAL #1   Title Patient will demonstrate she has regained full shoulder ROM and function post operatively compared to baselines.    Time 6    Period Weeks    Status New    Target Date 01/16/21      PT LONG TERM GOAL #2   Title pt will be independent in self MLD  to right UE and trunk    Time 4    Period Weeks    Status New    Target Date 01/02/21      PT LONG TERM GOAL #3   Title Pt will have minimal difference in circumference of both arms    Time 6    Period Weeks    Status  New    Target Date 01/16/21      PT LONG TERM GOAL #4   Title Pt will understand precautions for skin care and decreasing risk of infection/lymphedema    Time 6    Period Weeks    Status New    Target Date 01/16/21      PT LONG TERM GOAL #5   Title Pt will be independent in HEP for shoulder ROM and strengthening    Time 6                  Plan - 12/05/20 1848    Clinical Impression Statement Pt presents s/p bilateral mastectomies on 10/01/20 with right SLNB and a right ALND on 10/14/20. 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  She had a prior left Lumpectomy in 1993.She has no pain but does have swelling in the center of her chest and right lateral trunk. Circumferential measurements also reveal mild swelling in the Right UE. She has good shoulder ROM with mild cording bilateral axillary region.  She will benefit from [facility] technigues for cording and PROM, MLD for swelling in trunk and  right arm, and use of a compression sleeve to maintain/decrease swelling with bandaging to be used if needed.  she will try to be fit for sleeve in the next day or 2. She will also benefit from insruction in proper way to progress strength, and review of skin and lymphedema precautions.    Personal Factors and Comorbidities Comorbidity 1;Comorbidity 3+    Comorbidities Breast CA reoccurence, Aids, aortic atherosclerosis    Stability/Clinical Decision Making Stable/Uncomplicated    Rehab Potential Good    PT Frequency 2x / week    PT Duration 6 weeks    PT Treatment/Interventions ADLs/Self Care Home Management;Therapeutic activities;Therapeutic exercise;Neuromuscular re-education;Manual techniques;Orthotic Fit/Training;Patient/family education;Manual lymph drainage;Compression bandaging;Scar mobilization;Passive range of motion    PT Next Visit Plan LDex?           Patient will benefit from skilled therapeutic intervention in order to improve the following deficits and impairments:  Decreased knowledge of precautions,Decreased range of motion,Decreased skin integrity,Decreased scar mobility,Decreased strength,Increased edema,Increased fascial restricitons,Postural dysfunction  Visit Diagnosis: Inflammatory breast cancer, left (HCC)  Localized edema  Abnormal posture  Inflammatory breast cancer, right [facility])     Problem List Patient Active Problem List   Diagnosis Date Noted  . Primary malignant neoplasm of upper outer quadrant of right breast ([place]) 10/01/2020  . Drug-induced neutropenia ([place]) 06/28/2020  . Thrombocytopenia ([place]) 06/07/2020  . Port-A-Cath in place 04/19/2020  . Malignant neoplasm of upper-outer quadrant of left breast in female, estrogen receptor negative ([place]) 03/05/2020  . Rash and nonspecific skin eruption 11/08/2013  . History of breast cancer 12/20/2012  . HYPERTENSION, BENIGN ESSENTIAL 08/11/2007  . HIV DISEASE 04/14/2007    Claris Pong 12/05/2020,  6:58 PM  [place], [place], [address] Phone: [phone]   Fax:  [phone]  Name: Victoria Coffey MRN: [id number] Date of Birth: [birth year] Cheral Almas, PT 12/05/20 6:58 PM

## 2020-12-05 NOTE — Patient Instructions (Signed)
Pt was given instructions to go to a [facility] to be fit for a class 1 compression sleeve and gauntlet since Her right arm measured and appears slightly larger today.  She was also given the script to take with her.

## 2020-12-05 NOTE — Patient Instructions (Signed)
[facility] Discharge Instructions for Patients Receiving Chemotherapy  Today you received the following chemotherapy agents :  Keytruda.  To help prevent nausea and vomiting after your treatment, we encourage you to take your nausea medication as prescribed.   If you develop nausea and vomiting that is not controlled by your nausea medication, call the clinic.   BELOW ARE SYMPTOMS THAT SHOULD BE REPORTED IMMEDIATELY:  *FEVER GREATER THAN 100.5 F  *CHILLS WITH OR WITHOUT FEVER  NAUSEA AND VOMITING THAT IS NOT CONTROLLED WITH YOUR NAUSEA MEDICATION  *UNUSUAL SHORTNESS OF BREATH  *UNUSUAL BRUISING OR BLEEDING  TENDERNESS IN MOUTH AND THROAT WITH OR WITHOUT PRESENCE OF ULCERS  *URINARY PROBLEMS  *BOWEL PROBLEMS  UNUSUAL RASH Items with * indicate a potential emergency and should be followed up as soon as possible.  Feel free to call the clinic should you have any questions or concerns. The clinic phone number is [phone].  Please show the CHEMO ALERT CARD at check-in to the Emergency Department and triage nurse.  

## 2020-12-06 ENCOUNTER — Ambulatory Visit: Payer: BC Managed Care – PPO | Admitting: Radiation Oncology

## 2020-12-06 ENCOUNTER — Ambulatory Visit: Payer: BC Managed Care – PPO

## 2020-12-09 ENCOUNTER — Other Ambulatory Visit: Payer: Self-pay | Admitting: Adult Health

## 2020-12-09 ENCOUNTER — Ambulatory Visit: Admission: RE | Admit: 2020-12-09 | Payer: BC Managed Care – PPO | Source: Ambulatory Visit

## 2020-12-09 ENCOUNTER — Encounter: Payer: Self-pay | Admitting: Adult Health

## 2020-12-09 ENCOUNTER — Other Ambulatory Visit: Payer: Self-pay

## 2020-12-09 ENCOUNTER — Ambulatory Visit: Payer: BC Managed Care – PPO

## 2020-12-09 DIAGNOSIS — D702 Other drug-induced agranulocytosis: Secondary | ICD-10-CM

## 2020-12-09 DIAGNOSIS — C50412 Malignant neoplasm of upper-outer quadrant of left female breast: Secondary | ICD-10-CM | POA: Diagnosis not present

## 2020-12-09 DIAGNOSIS — D696 Thrombocytopenia, unspecified: Secondary | ICD-10-CM

## 2020-12-09 DIAGNOSIS — B20 Human immunodeficiency virus [HIV] disease: Secondary | ICD-10-CM

## 2020-12-09 NOTE — Progress Notes (Signed)
I connected by phone with Victoria Coffey on 12/09/2020, 4:24 PM to discuss the potential use of a new treatment for mild to moderate COVID-19 viral infection in non-hospitalized patients.  This patient is a 65 y.o. female that meets the FDA criteria for Emergency Use Authorization of tixagevimab/cilgavimab for pre-exposure prophylaxis of COVID-19 disease. Pt meets following criteria:  Age >12 yr and weight > 40kg  Not currently infected with SARS-CoV-2 and has no known recent exposure to an individual infected with SARS-CoV-2 AND o Who has moderate to severe immune compromise due to a medical condition or receipt of immunosuppressive medications or treatments and may not mount an adequate immune response to COVID-19 vaccination or  o Vaccination with any available COVID-19 vaccine, according to the approved or authorized schedule, is not recommended due to a history of severe adverse reaction (e.g., severe allergic reaction) to a COVID-19 vaccine(s) and/or COVID-19 vaccine component(s).  o Patient meets the following definition of mod-severe immune compromised status: 7. Solid tumor malignancies on immunomodulatory chemotherapy or advanced AIDS   I have spoken and communicated the following to the patient or parent/caregiver regarding COVID monoclonal antibody treatment:  1. FDA has authorized the emergency use of tixagevimab/cilgavimab for the pre-exposure prophylaxis of COVID-19 in patients with moderate-severe immunocompromised status, who meet above EUA criteria.  2. The significant known and potential risks and benefits of COVID monoclonal antibody, and the extent to which such potential risks and benefits are unknown.  3. Information on available alternative treatments and the risks and benefits of those alternatives, including clinical trials.  4. The patient or parent/caregiver has the option to accept or refuse COVID monoclonal antibody treatment.  After reviewing this information with the  patient, agree to receive tixagevimab/cilgavimab  Scot Dock, NP, 12/09/2020, 4:24 PM

## 2020-12-10 ENCOUNTER — Ambulatory Visit: Payer: BC Managed Care – PPO

## 2020-12-10 ENCOUNTER — Other Ambulatory Visit: Payer: Self-pay

## 2020-12-10 ENCOUNTER — Ambulatory Visit
Admission: RE | Admit: 2020-12-10 | Discharge: 2020-12-10 | Disposition: A | Discharge status: Home / Self Care | Payer: BC Managed Care – PPO | Source: Ambulatory Visit | Attending: Radiation Oncology | Admitting: Radiation Oncology

## 2020-12-10 DIAGNOSIS — C50412 Malignant neoplasm of upper-outer quadrant of left female breast: Secondary | ICD-10-CM | POA: Diagnosis not present

## 2020-12-10 NOTE — Progress Notes (Signed)
RFV: follow up for hiv disease  Patient ID: Victoria Coffey, female   DOB: [birth year], 65 y.o.   MRN: [id number]  HPI Victoria Coffey 12YQ F with well controlled hiv disease, continues on breat cancer treatment, now on xeloda. She continues to stay to herself, minimize exposure to covid-19 illness. She has not had any issues to be hospitalized. She has not had issues with taking odefsey.  Outpatient Encounter Medications as of 12/02/2020  Medication Sig  . acetaminophen (TYLENOL) 500 MG tablet Take 1,000 mg by mouth every 6 (six) hours as needed for moderate pain or headache.   . capecitabine (XELODA) 500 MG tablet Take 3 tabs in morning and 2 tabs in evening, on days of radiation, Monday through Friday.  Marland Kitchen emtricitabine-rilpivir-tenofovir AF (ODEFSEY) 200-25-25 MG TABS tablet Take 1 tablet by mouth daily with breakfast.  . hydrochlorothiazide (HYDRODIURIL) 25 MG tablet TAKE 1 TABLET(25 MG) BY MOUTH DAILY  . lidocaine-prilocaine (EMLA) cream Apply 1 application topically See admin instructions. Apply 1-2 hours prior to access  . loratadine (CLARITIN) 10 MG tablet Take 10 mg by mouth See admin instructions. Takes 3-4 days before and after treatment  . traMADol (ULTRAM) 50 MG tablet Take 1 tablet (50 mg total) by mouth every 6 (six) hours as needed (mild pain).  . [DISCONTINUED] potassium chloride SA (KLOR-CON) 20 MEQ tablet Take 1 tablet (20 mEq total) by mouth 2 (two) times daily. Take 1 tab twice daily for 3 days then 1 tab once daily (Patient taking differently: Take 20 mEq by mouth 2 (two) times daily.)  . [DISCONTINUED] gabapentin (NEURONTIN) 100 MG capsule Take 1 capsule (100 mg total) by mouth 2 (two) times daily. (Patient not taking: Reported on 12/02/2020)  . [DISCONTINUED] methocarbamol (ROBAXIN) 500 MG tablet Take 1 tablet (500 mg total) by mouth every 6 (six) hours as needed for muscle spasms. (Patient not taking: No sig reported)  . [DISCONTINUED] oxyCODONE (OXY IR/ROXICODONE) 5 MG immediate  release tablet Take 1-2 tablets (5-10 mg total) by mouth every 4 (four) hours as needed for moderate pain, severe pain or breakthrough pain. (Patient not taking: No sig reported)  . [DISCONTINUED] prochlorperazine (COMPAZINE) 10 MG tablet Take 1 tablet (10 mg total) by mouth every 6 (six) hours as needed (Nausea or vomiting).   No facility-administered encounter medications on file as of 12/02/2020.     Patient Active Problem List   Diagnosis Date Noted  . Primary malignant neoplasm of upper outer quadrant of right breast ([place]) 10/01/2020  . Drug-induced neutropenia ([place]) 06/28/2020  . Thrombocytopenia ([place]) 06/07/2020  . Port-A-Cath in place 04/19/2020  . Malignant neoplasm of upper-outer quadrant of left breast in female, estrogen receptor negative ([place]) 03/05/2020  . Rash and nonspecific skin eruption 11/08/2013  . History of breast cancer 12/20/2012  . HYPERTENSION, BENIGN ESSENTIAL 08/11/2007  . HIV DISEASE 04/14/2007     Health Maintenance Due  Topic Date Due  . TETANUS/TDAP  Never done  . COLONOSCOPY (Pts 45-35yrs Insurance coverage will need to be confirmed)  Never done  . PAP SMEAR-Modifier  11/14/2011   soc hx: no smoking or ETOH use  Review of Systems 12 point ros is negative other than what is mentioned above.  Physical Exam   BP (!) 144/87   Pulse 97   Temp 97.7 F (36.5 C) (Oral)   Ht 5\' 1"  (1.549 m)   Wt 131 lb (59.4 kg)   SpO2 99%   BMI 24.75 kg/m   Physical Exam  Constitutional:  oriented to person, place, and time. appears well-developed and well-nourished. No distress.  HENT: [place]/AT, PERRLA, no scleral icterus Mouth/Throat: Oropharynx is clear and moist. No oropharyngeal exudate.  Chest wall = portacath in place Neurological: alert and oriented to person, place, and time.  Skin: Skin is warm and dry. No rash noted. No erythema.  Psychiatric: a normal mood and affect.  behavior is normal.    Lab Results  Component Value Date   CD4TCELL 43  07/26/2020   Lab Results  Component Value Date   CD4TABS 393 (L) 07/26/2020   CD4TABS 617 02/09/2020   CD4TABS 532 11/06/2019   Lab Results  Component Value Date   HIV1RNAQUANT <20 05/31/2020   Lab Results  Component Value Date   HEPBSAB NO 12/27/2006   Lab Results  Component Value Date   LABRPR NON-REACTIVE 03/13/2019    CBC Lab Results  Component Value Date   WBC 3.7 (L) 12/05/2020   RBC 3.46 (L) 12/05/2020   HGB 10.6 (L) 12/05/2020   HCT 31.5 (L) 12/05/2020   PLT 233 12/05/2020   MCV 91.0 12/05/2020   MCH 30.6 12/05/2020   MCHC 33.7 12/05/2020   RDW 14.1 12/05/2020   LYMPHSABS 1.2 12/05/2020   MONOABS 0.3 12/05/2020   EOSABS 0.1 12/05/2020    BMET Lab Results  Component Value Date   NA 139 12/05/2020   K 3.1 (L) 12/05/2020   CL 104 12/05/2020   CO2 26 12/05/2020   GLUCOSE 100 (H) 12/05/2020   BUN 20 12/05/2020   CREATININE 0.90 12/05/2020   CALCIUM 9.1 12/05/2020   GFRNONAA >60 12/05/2020   GFRAA >60 08/02/2020    Assessment and Plan  hiv disease= plan to continue to take odefsey. Anticipate drop in CD 4 coutn while on cancer treatment, we will see if drops below 200 for oi proph.  Hypokalemia = will check that she is taking supplemental oral potassium. In part due to HCTZ potassium loss  Neutropenia/long term medication management = drug induced. Continue to monitor  Hypertension = slightly above goal. Asked her to check BP at home or when she is out at grocery store to see if elevated outside of the doctor's office  Health maintenance = will reach out to her oncologist to see if can get her set up for evusheld since she is IC patient at risk for covid-19.

## 2020-12-11 ENCOUNTER — Ambulatory Visit: Payer: BC Managed Care – PPO

## 2020-12-11 ENCOUNTER — Ambulatory Visit
Admission: RE | Admit: 2020-12-11 | Discharge: 2020-12-11 | Disposition: A | Discharge status: Home / Self Care | Payer: BC Managed Care – PPO | Source: Ambulatory Visit | Attending: Radiation Oncology | Admitting: Radiation Oncology

## 2020-12-11 DIAGNOSIS — C50412 Malignant neoplasm of upper-outer quadrant of left female breast: Secondary | ICD-10-CM | POA: Diagnosis not present

## 2020-12-12 ENCOUNTER — Ambulatory Visit
Admission: RE | Admit: 2020-12-12 | Discharge: 2020-12-12 | Disposition: A | Discharge status: Home / Self Care | Payer: BC Managed Care – PPO | Source: Ambulatory Visit | Attending: Radiation Oncology | Admitting: Radiation Oncology

## 2020-12-12 ENCOUNTER — Ambulatory Visit: Payer: BC Managed Care – PPO

## 2020-12-12 ENCOUNTER — Other Ambulatory Visit: Payer: Self-pay

## 2020-12-12 DIAGNOSIS — C50412 Malignant neoplasm of upper-outer quadrant of left female breast: Secondary | ICD-10-CM | POA: Diagnosis not present

## 2020-12-12 NOTE — Progress Notes (Signed)
Pt here for patient teaching.  Pt given Radiation and You booklet, skin care instructions, [place] deodorant and Radiaplex gel.  Reviewed areas of pertinence such as fatigue, hair loss, skin changes, breast tenderness and breast swelling . Pt able to give teach back of to pat skin and use unscented/gentle soap,apply Radiaplex bid, avoid applying anything to skin within 4 hours of treatment, avoid wearing an under wire bra and to use an electric razor if they must shave. Pt verbalizes understanding of information given and will contact nursing with any questions or concerns.     Orrin Brigham , LPN

## 2020-12-13 ENCOUNTER — Ambulatory Visit: Payer: BC Managed Care – PPO

## 2020-12-13 ENCOUNTER — Other Ambulatory Visit: Payer: Self-pay

## 2020-12-13 ENCOUNTER — Ambulatory Visit
Admission: RE | Admit: 2020-12-13 | Discharge: 2020-12-13 | Disposition: A | Discharge status: Home / Self Care | Payer: BC Managed Care – PPO | Source: Ambulatory Visit | Attending: Radiation Oncology | Admitting: Radiation Oncology

## 2020-12-13 DIAGNOSIS — C50412 Malignant neoplasm of upper-outer quadrant of left female breast: Secondary | ICD-10-CM | POA: Diagnosis not present

## 2020-12-13 DIAGNOSIS — Z170 Estrogen receptor positive status [ER+]: Secondary | ICD-10-CM

## 2020-12-13 MED ORDER — ALRA NON-METALLIC DEODORANT (RAD-ONC)
1.0000 "application " | Freq: Once | TOPICAL | Status: AC
  Administered 2020-12-13: 1 via TOPICAL

## 2020-12-13 MED ORDER — RADIAPLEXRX EX GEL: Freq: Once | CUTANEOUS | Status: AC

## 2020-12-13 NOTE — Progress Notes (Signed)
[place]   [phone] [phone]   Clinic Follow up Note   Patient Care Team: [name], MD as PCP - General (Infectious Diseases) [name], MD as PCP - Infectious Diseases (Infectious Diseases) [name], MD as Consulting Physician (General Surgery) [name], MD as Consulting Physician (Hematology) [name], MD as Consulting Physician (Radiation Oncology) [name], RN as Oncology Nurse Navigator [name], RN as Oncology Nurse Navigator  Date of Service:  12/16/2020  CHIEF COMPLAINT: F/u ofbilateralbreast cancer  SUMMARY OF ONCOLOGIC HISTORY: Oncology History Overview Note  Cancer Staging Bilateral breast cancer [facility]) Staging form: Breast, AJCC 8th Edition - Pathologic stage from 10/01/2020: Stage IIIC (pT3, pN2a, cM0, G3, ER-, PR-, HER2-) - Signed by Alla Feeling, NP on 10/24/2020  Malignant neoplasm of upper-outer quadrant of left breast in female, estrogen receptor positive ([place]) Staging form: Breast, AJCC 8th Edition - Clinical stage from 03/01/2020: Stage IIIC (cT4, cN0, cM0, G3, ER+, PR-, HER2-) - Signed by [name], MD on 08/22/2020 - Pathologic stage from 10/01/2020: Stage IIIC (pT4b, pN0, cM0, G3, ER-, PR-, HER2-) - Signed by Alla Feeling, NP on 10/24/2020    Malignant neoplasm of upper-outer quadrant of left breast in female, estrogen receptor negative ([place])  02/28/2020 Mammogram   Diagnostic Mammogram 02/28/20  IMPRESSION The 3.9 cm ill defined mass in the left breast posterior depth central 4.3cm to the nipple seen on the mediolateral oblique view onlt with associated difssue skin thickening is highly suspicious for malignancy.    03/01/2020 Cancer Staging   Staging form: Breast, AJCC 8th Edition - Clinical stage from 03/01/2020: Stage IIIC (cT4, cN0, cM0, G3, ER+, PR-, HER2-) - Signed by [name], MD on 08/22/2020   03/01/2020 Initial Biopsy   Diagnosis 03/01/20  1. Breast, left,  needle core biopsy, 9 o'clock, 3-4cmfn - INVASIVE DUCTAL CARCINOMA. SEE NOTE 2. Lymph node, needle/core biopsy, right axilla - INVASIVE DUCTAL CARCINOMA. SEE NOTE Diagnosis Note 1. Invasive carcinoma measures 1.5 cm in greatest linear dimension and appears grade 3. Dr. [name] reviewed the case and concurs with the diagnosis. A breast prognostic profile (ER, PR, Ki-67 and HER2) is pending and will be reported in an addendum. Dr. [name] was notified on 03/04/2020. 2. Carcinoma measures 0.6 cm in greatest linear dimension and appears grade 3. Lymphoid tissue is not identified - this may represent a completely replaced lymph node. A breast prognostic profile (ER, PR, Ki-67 and HER2) is pending and will be reported in an addendum. Dr. [name] reviewed the case and concurs with the diagnosis. Dr. [name] was notified on 03/04/2020.   03/01/2020 Receptors her2   1. PROGNOSTIC INDICATORS Results: IMMUNOHISTOCHEMICAL AND MORPHOMETRIC ANALYSIS PERFORMED MANUALLY The tumor cells are NEGATIVE for Her2 (0). Estrogen Receptor: 20%, POSITIVE, WEAK STAINING INTENSITY Progesterone Receptor: 0%, NEGATIVE Proliferation Marker Ki67: 40% COMMENT: The negative hormone receptor study(ies) in this case has an internal positive control.    2. PROGNOSTIC INDICATORS Results: IMMUNOHISTOCHEMICAL AND MORPHOMETRIC ANALYSIS PERFORMED MANUALLY The tumor cells are NEGATIVE for Her2 (1+). Estrogen Receptor: 10%, POSITIVE, WEAK STAINING INTENSITY Progesterone Receptor: 0%, NEGATIVE Proliferation Marker Ki67: 40% COMMENT: The negative hormone receptor study(ies) in this case has no internal positive control.   03/05/2020 Initial Diagnosis   Malignant neoplasm of upper-outer quadrant of left breast in female, estrogen receptor positive ([place])   03/18/2020 Breast MRI   IMPRESSION: 1. 7 x 9 x 6 cm area of suspicious non masslike enhancement within the central/UPPER RIGHT breast  with anterior skin thickening.  Given biopsy-proven metastatic RIGHT axillary lymph node, tissue sampling of the anterior and posterior aspects of this non masslike RIGHT breast enhancement is recommended to exclude malignancy. 2. 7 x 6 x 5 cm diffuse masslike and nonmasslike enhancement within the majority of the remaining LEFT breast with diffuse LEFT breast skin thickening, compatible with malignancy. Abnormal enhancement is noted along the anterior aspect of the LEFT pectoralis muscle. 3. Three abnormal RIGHT axillary lymph nodes, 1 which is biopsy proven to be metastatic disease. No abnormal LEFT axillary lymph nodes identified.   03/20/2020 Echocardiogram   Baseline ECHO  IMPRESSIONS     1. The patient was reported to be in pain during the study and not able  to participate in this study. All that can be said is that the LV and RV  function are grossly normal. Would recommend to repeat this study when/if  the patient is able to tolerate  the exam.   2. Left ventricular ejection fraction, by estimation, is 60 to 65%. The  left ventricle has normal function. Left ventricular endocardial border  not optimally defined to evaluate regional wall motion. Left ventricular  diastolic function could not be  evaluated.   3. Right ventricular systolic function is normal. The right ventricular  size is normal.   4. The mitral valve was not well visualized. No evidence of mitral valve  regurgitation.   5. The aortic valve was not well visualized. Aortic valve regurgitation  is not visualized.   6. The inferior vena cava is normal in size with greater than 50%  respiratory variability, suggesting right atrial pressure of 3 mmHg.    03/20/2020 Imaging   CT CAP W contrast  IMPRESSION: 1. Small right axillary/subpectoral lymph nodes. Difficult to exclude metastatic disease. 2. Subtle 6 mm low-attenuation lesion in the dome of the liver, not well seen previously. Lesion is too small to characterize. Further evaluation  could be performed with MR abdomen without and with contrast, as clinically indicated. 3. Hepatic steatosis. 4. Aortic atherosclerosis (ICD10-I70.0). Coronary artery calcification.     03/20/2020 Imaging   Whole body bone scan  IMPRESSION: 1. Increased activity noted over the right breast, possibly related to the patient's right breast cancer.   2. Punctate area of increased activity over the left maxillary region, most likely related to sinus disease or dental disease. Increased activity noted over the left mandible also possibly related to dental disease. No other focal bony abnormalities to suggest metastatic disease identified.   03/22/2020 Procedure   PAC placement by Dr [name]    03/22/2020 - 08/23/2020 Chemotherapy   AC q2weeks for 4 cycles 03/22/20-05/03/20 followed by weekly Taxol and Carboplatin q3weeks for 12 weeks starting 05/17/20.                  Week 2 Taxol reduced to half dose and CT was dose reduced starting with week 4 due to thrombocytopenia. Given pancytopenia, will change Carboplatin to 50% dose weekly with Taxol 22m/m2 starting with week 7 (06/13/20). Will hold Carboplatin as needed based on labs. Carboplatin increased to 80% starting with week 8. Carboplatin dose reduced for C11 and held with C12. C12 Taxol postponed to 08/23/20   03/22/2020 Pathology Results   FINAL MICROSCOPIC DIAGNOSIS:   A. BREAST, RIGHT, PUNCH BIOPSY:  - Carcinoma involving dermal lymphatics.  - See comment.   B. BREAST, LEFT, PUNCH BIOPSY:  - Carcinoma involving dermal lymphatics.  - See comment.   COMMENT:  The  morphology is compatible with ductal breast carcinoma.    ADDENDUM:   PROGNOSTIC INDICATOR RESULTS:   Immunohistochemical and morphometric analysis performed manually   The tumor cells are NEGATIVE for Her2 (0%).   Estrogen Receptor:       NEGATIVE, 0%  Progesterone Receptor:   NEGATIVE, 0%    08/13/2020 Breast MRI   IMPRESSION: 1. Interval significant response to  treatment of the diffuse non-mass enhancement involving the UPPER RIGHT breast. There is a solitary residual 5 mm indeterminate mass in the UPPER OUTER QUADRANT at MIDDLE depth with plateau kinetics. 2. Minimal response to treatment of the diffuse non-mass enhancement throughout the LEFT breast. 3. Resolution of enhancement of the skin and nipple-areolar complex of the RIGHT breast. 4. Near complete resolution of enhancement of the skin and nipple-areolar complex of the LEFT breast. Minimal residual nipple-areola complex enhancement persists. 5. The 3 previously identified pathologic RIGHT axillary lymph nodes are now normal in appearance. There is no pathologic lymphadenopathy currently.   08/23/2020 -  Chemotherapy   Immunotherapy Keytruda q3weeks starting 08/23/20 for 1 year treatment.    10/01/2020 Surgery   MASTECTOMIES WITH RIGHT SENTINEL LYMPH NODE BIOPSIES,RIGHT TARGETED RADIOACTIVE SEED LYMPH NODE, LEFT BREAST MODIFIED RADICAL MASTECTOMY  and RADIOACTIVE SEED GUIDED  TARGETED RIGHT SENTINEL LYMPH NODE BIOPSY by Dr [name]     10/01/2020 Pathology Results   FINAL MICROSCOPIC DIAGNOSIS:   A. BREAST, LEFT, MASTECTOMY:  - Invasive ductal carcinoma, 7 cm.  - Anterior and posterior soft tissue margins not involved by tumor.  - Foci of skin involvement by tumor, see comment.  - Nipple involvement by tumor.  - See oncology table and comment.   B. LYMPH NODE, LEFT AXILLARY, CONTENTS:  - One lymph node with no metastatic carcinoma (0/1).   C. BREAST, RIGHT, MASTECTOMY:  - Invasive ductal carcinoma, greater than 5 cm.  - Margins not involved.  - Subareolar breast tissue involved by tumor.  - See oncology table and comment.   D. LYMPH NODE, RIGHT #2, SENTINEL, EXCISION:  - Metastatic carcinoma in one lymph node, 0.5 cm (1/1).   E. LYMPH NODE, RIGHT #1, SENTINEL, EXCISION:  - Metastatic carcinoma in one lymph node, 1.5 cm (1/1).  - Biopsy site and biopsy clip.   F. LYMPH  NODE, RIGHT #3, SENTINEL, EXCISION:  - Metastatic carcinoma in one lymph node, 1.5 cm (1/1).   G. LYMPH NODE, RIGHT #4, SENTINEL, EXCISION:  - One lymph node with no metastatic carcinoma (0/1).   H. LYMPH NODE, RIGHT #5, SENTINEL, EXCISION:  - Metastatic carcinoma in one lymph node, 0.5 cm (1/1).   I. LYMPH NODE, RIGHT #6, SENTINEL, EXCISION:  - One lymph node with no metastatic carcinoma (0/1).    10/01/2020 Cancer Staging   Staging form: Breast, AJCC 8th Edition - Pathologic stage from 10/01/2020: Stage IIIC (pT4b, pN0, cM0, G3, ER-, PR-, HER2-) - Signed by Alla Feeling, NP on 10/24/2020   10/14/2020 Surgery   RIGHT AXILLARY LYMPH NODE DISSECTION by Dr [name]  -1 /13 positive LN for metastatic carcinoma    11/18/2020 -  Chemotherapy   Adjuvant Xeloda 1500 mg BID days 1-14 q21 days x6 months, starting 11/18/2020 (will reduce to 1500 mg Am/1000 mg Pm M-F during radiation)   12/10/2020 -  Radiation Therapy   Adjuvant Radiation to right chest wall and axilla with Dr Lisbeth Renshaw on 12/10/20      CURRENT THERAPY:  1.Keytruda q3weeks starting 08/23/20 for 1 year treatment. 2.Adjuvant Xeloda1500 mg BID days  1-14 q21 days x6 months, starting 11/18/2020(will reduce to 1500 mg Am/1000 mg Pm M-F during radiation) 3. Adjuvant Radiation to right chest wall and axilla with Dr Lisbeth Renshaw on 12/10/20  INTERVAL HISTORY:  Allanah Mcfarland Mairena is here for a follow up. She presents to the clinic alone. She notes radiation is going well. She denies redness of her skin or skin issues. She denies any fatigue. She is on Xeloda at 1595m in the AM and 10072min the PM M-F. She does not take it on the weekend. She denies any issues from Xeloda so far.  She notes she uses mostly tylenol for pain but still has tramadol if needed. She notes she still wears her compression bra as her bra prosthesis could irritate her skin. She notes with compression sleeve there is improvement in her arm lymphedema.     REVIEW OF SYSTEMS:    Constitutional: Denies fevers, chills or abnormal weight loss Eyes: Denies blurriness of vision Ears, nose, mouth, throat, and face: Denies mucositis or sore throat Respiratory: Denies cough, dyspnea or wheezes Cardiovascular: Denies palpitation, chest discomfort or lower extremity swelling Gastrointestinal:  Denies nausea, heartburn or change in bowel habits Skin: Denies abnormal skin rashes Lymphatics: Denies new lymphadenopathy or easy bruising Neurological:Denies numbness, tingling or new weaknesses Behavioral/Psych: Mood is stable, no new changes  All other systems were reviewed with the patient and are negative.  MEDICAL HISTORY:  Past Medical History:  Diagnosis Date  . [place]  . Breast cancer ([facility] 1993   left  . Fatty liver   . History of left breast cancer   . HIV infection ([place]  . Hypertension   . Port-A-Cath in place     SURGICAL HISTORY: Past Surgical History:  Procedure Laterality Date  . AXILLARY LYMPH NODE DISSECTION Right 10/14/2020   Procedure: left axilla removal of two drains right axillary lymph node dissection;  Surgeon: ByStark KleinMD;  Location: [facility];  Service: General;  Laterality: Right;  PEC BLOCK, RNFA OR PA  . BREAST BIOPSY Bilateral 03/22/2020   Procedure: BILATERAL BREAST PUNCH BIOPSIES;  Surgeon: ByStark KleinMD;  [facility] Service: General;  Laterality: Bilateral;  . BREAST LUMPECTOMY    . CESAREAN SECTION    . left breast lumpectomy  1993  . MASTECTOMY W/ SENTINEL NODE BIOPSY Bilateral 10/01/2020  . MASTECTOMY W/ SENTINEL NODE BIOPSY Bilateral 10/01/2020   Procedure: BILATERAL MASTECTOMIES WITH RIGHT SENTINEL LYMPH NODE BIOPSIES,RIGHT TARGETED RADIOACTIVE SEED LYMPH NODE, LEFT BREAST MODIFIED RADICAL MASTECTOMY ;  Surgeon: ByStark KleinMD;  [place] [facility] Service: General;  Laterality: Bilateral;  PEC BLOCK, RNFA  . PORTACATH PLACEMENT Right 03/22/2020   Procedure: INSERTION PORT-A-CATH WITH  ULTRASOUND GUIDANCE;  Surgeon: ByStark KleinMD;  [facility] Service: General;  Laterality: Right;  . RADIOACTIVE SEED GUIDED AXILLARY SENTINEL LYMPH NODE Right 10/01/2020   Procedure: RADIOACTIVE SEED GUIDED  TARGETED RIGHT SENTINEL LYMPH NODE BIOPSY;  Surgeon: ByStark KleinMD;  [facility] Service: General;  Laterality: Right;  PEC BLOCK, RNFA    I have reviewed the social history and family history with the patient and they are unchanged from previous note.  ALLERGIES:  is allergic to codeine, other, grapefruit bioflavonoid complex, pomegranate [punica], and shellfish allergy.  MEDICATIONS:  Current Outpatient Medications  Medication Sig Dispense Refill  . acetaminophen (TYLENOL) 500 MG tablet Take 1,000 mg by mouth every 6 (six) hours as needed for moderate pain or headache.     .Marland Kitchen  capecitabine (XELODA) 500 MG tablet Take 3 tabs in morning and 2 tabs in evening, on days of radiation, Monday through Friday. 75 tablet 1  . emtricitabine-rilpivir-tenofovir AF (ODEFSEY) 200-25-25 MG TABS tablet Take 1 tablet by mouth daily with breakfast. 30 tablet 11  . hydrochlorothiazide (HYDRODIURIL) 25 MG tablet TAKE 1 TABLET(25 MG) BY MOUTH DAILY 30 tablet 0  . lidocaine-prilocaine (EMLA) cream Apply 1 application topically See admin instructions. Apply 1-2 hours prior to access    . loratadine (CLARITIN) 10 MG tablet Take 10 mg by mouth See admin instructions. Takes 3-4 days before and after treatment    . potassium chloride SA (KLOR-CON) 20 MEQ tablet Take 1 tablet (20 mEq total) by mouth 2 (two) times daily. 60 tablet 1  . traMADol (ULTRAM) 50 MG tablet Take 1 tablet (50 mg total) by mouth every 6 (six) hours as needed (mild pain). 30 tablet 1   No current facility-administered medications for this visit.    PHYSICAL EXAMINATION: ECOG PERFORMANCE STATUS: 1 - Symptomatic but completely ambulatory  Vitals:   12/16/20 0820  BP: 127/90  Pulse: 95  Resp: 18  Temp: (!) 97.1 F (36.2  C)  SpO2: 100%   Filed Weights   12/16/20 0820  Weight: 128 lb 11.2 oz (58.4 kg)    GENERAL:alert, no distress and comfortable SKIN: skin color, texture, turgor are normal, no rashes or significant lesions EYES: normal, Conjunctiva are pink and non-injected, sclera clear  NECK: supple, thyroid normal size, non-tender, without nodularity LYMPH:  no palpable lymphadenopathy in the cervical, axillary  LUNGS: clear to auscultation and percussion with normal breathing effort HEART: regular rate & rhythm and no murmurs and no lower extremity edema ABDOMEN:abdomen soft, non-tender and normal bowel sounds Musculoskeletal:no cyanosis of digits and no clubbing  NEURO: alert & oriented x 3 with fluent speech, no focal motor/sensory deficits BREAST: S/p b/l mastectomy with breasts surgically absent: Surgical incision healed well. (+) Mild skin redness from RT. No palpable mass, nodules or adenopathy bilaterally. Breast exam benign.   LABORATORY DATA:  I have reviewed the data as listed CBC Latest Ref Rng & Units 12/16/2020 12/05/2020 11/14/2020  WBC 4.0 - 10.5 K/uL 3.3(L) 3.7(L) 4.4  Hemoglobin 12.0 - 15.0 g/dL 11.4(L) 10.6(L) 10.4(L)  Hematocrit 36.0 - 46.0 % 33.6(L) 31.5(L) 31.8(L)  Platelets 150 - 400 K/uL 211 233 255     CMP Latest Ref Rng & Units 12/05/2020 11/14/2020 10/24/2020  Glucose 70 - 99 mg/dL 100(H) 100(H) 101(H)  BUN 8 - 23 mg/dL _0 Creatinine 0.44 - 1.00 mg/dL 0.90 0.93 0.85  Sodium 135 - 145 mmol/L 139 137 137  Potassium 3.5 - 5.1 mmol/L 3.1(L) 3.3(L) 3.4(L)  Chloride 98 - 111 mmol/L 104 105 105  CO2 22 - 32 mmol/L _1 Calcium 8.9 - 10.3 mg/dL 9.1 9.3 9.0  Total Protein 6.5 - 8.1 g/dL 7.3 7.5 7.0  Total Bilirubin 0.3 - 1.2 mg/dL 0.3 0.2(L) <0.2(L)  Alkaline Phos 38 - 126 U/L 444(H) 347(H) 210(H)  AST 15 - 41 U/L 54(H) 53(H) 36  ALT 0 - 44 U/L 61(H) 50(H) 29      RADIOGRAPHIC STUDIES: I have personally reviewed the radiological images as listed and agreed  with the findings in the report. No results found.   ASSESSMENT & PLAN:  TACORI KVAMME is a 65 y.o. 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 -Further work up showed 9cm nodular area in right breast and skin biopsy confirmed cancer involvement -Staging scans were negative for distant metastasis -She completed AC X4 and weekly TC 03/22/20-08/23/20.  -Given recentpublish Keynote 522 trial data,I started her on Immunotherapy Keytruda q3weeks for 1 year of treatment beginning 08/23/20. -She underwent b/l mastectomy with Dr [name] on 10/01/20. She also underwent right axillary lymph node dissection on 10/14/2020.  -Due tohersignificantresidual disease in LNs,Istarted her on adjuvant Xeloda1500 mg twice daily, 2 weeks on and one week off (1573m am, 10063mpm M-F when she is on radiation) for 6 monthson 11/18/20 to reduce her high risk of recurrence after surgery.  -She started adjuvant radiation to right chest wall and axilla on 12/10/20 with Dr MoLisbeth RenshawTolerating well so far with no fatigue.  -She is tolerating Xeloda well with no major side effects so far. Labs reviewed, Hg 11.2, WBC 3.3. Overall adequate to continue Xeloda at same dose. Will continue Radiation.  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 H/o Left breast cancer, Dx in 1993s/p lumpectomy, radiation, chemo, Tamoxifen.Genetic Testinghas not been done yet.  4. Comorbidities: HIV(+)Dx 1993, HTN -Her HIV has been undetectable and  well controlled. She is currently being seen by ID Dr SnGraylon Goodwill refer her for Evusheld injection for COVID prevention, discussed with Dr. SnBaxter Flatteryshe is interested   5. Hypokalemia -continue KCL, currently on increase to 2033mtwice daily since 12/05/20  6.Mild transaminitis -We will monitor   PLAN: -Continue CCRT with Xeloda 1500m3m and 1000mg95mMondays through Fridays  -Continue Keytruda every 3 weeks, next on 2/24 -Lab, flush, F/u and Keytruda on 2/24   No problem-specific Assessment & Plan notes found for this encounter.   No orders of the defined types were placed in this encounter.  All questions were answered. The patient knows to call the clinic with any problems, questions or concerns. No barriers to learning was detected. The total time spent in the appointment was 30 minutes.     Kyland No FTruitt Merle2/14/2022   I, AmoyaJoslyn Devonacting as scribe for Eladia Frame FTruitt Merle   I have reviewed the above documentation for accuracy and completeness, and I agree with the above.

## 2020-12-16 ENCOUNTER — Ambulatory Visit: Payer: BC Managed Care – PPO

## 2020-12-16 ENCOUNTER — Ambulatory Visit
Admission: RE | Admit: 2020-12-16 | Discharge: 2020-12-16 | Disposition: A | Discharge status: Home / Self Care | Payer: BC Managed Care – PPO | Source: Ambulatory Visit | Attending: Radiation Oncology | Admitting: Radiation Oncology

## 2020-12-16 ENCOUNTER — Other Ambulatory Visit: Payer: Self-pay

## 2020-12-16 ENCOUNTER — Inpatient Hospital Stay (HOSPITAL_BASED_OUTPATIENT_CLINIC_OR_DEPARTMENT_OTHER): Payer: BC Managed Care – PPO | Admitting: Hematology

## 2020-12-16 ENCOUNTER — Inpatient Hospital Stay: Payer: BC Managed Care – PPO

## 2020-12-16 ENCOUNTER — Encounter: Payer: Self-pay | Admitting: Hematology

## 2020-12-16 VITALS — BP 127/90 | HR 95 | Temp 97.1°F | Resp 18 | Ht 61.0 in | Wt 128.7 lb

## 2020-12-16 DIAGNOSIS — C50412 Malignant neoplasm of upper-outer quadrant of left female breast: Secondary | ICD-10-CM

## 2020-12-16 DIAGNOSIS — Z5112 Encounter for antineoplastic immunotherapy: Secondary | ICD-10-CM | POA: Diagnosis not present

## 2020-12-16 DIAGNOSIS — Z95828 Presence of other vascular implants and grafts: Secondary | ICD-10-CM

## 2020-12-16 DIAGNOSIS — Z170 Estrogen receptor positive status [ER+]: Secondary | ICD-10-CM

## 2020-12-16 LAB — CMP (CANCER CENTER ONLY)
ALT: 71 U/L — ABNORMAL HIGH (ref 0–44)
AST: 72 U/L — ABNORMAL HIGH (ref 15–41)
Albumin: 3.6 g/dL (ref 3.5–5.0)
Alkaline Phosphatase: 518 U/L — ABNORMAL HIGH (ref 38–126)
Anion gap: 11 (ref 5–15)
BUN: 21 mg/dL (ref 8–23)
CO2: 23 mmol/L (ref 22–32)
Calcium: 9.3 mg/dL (ref 8.9–10.3)
Chloride: 103 mmol/L (ref 98–111)
Creatinine: 1.05 mg/dL — ABNORMAL HIGH (ref 0.44–1.00)
GFR, Estimated: 59 mL/min — ABNORMAL LOW (ref >60)
Glucose, Bld: 114 mg/dL — ABNORMAL HIGH (ref 70–99)
Potassium: 3.5 mmol/L (ref 3.5–5.1)
Sodium: 137 mmol/L (ref 135–145)
Total Bilirubin: 0.3 mg/dL (ref 0.3–1.2)
Total Protein: 7.8 g/dL (ref 6.5–8.1)

## 2020-12-16 LAB — CBC WITH DIFFERENTIAL (CANCER CENTER ONLY)
Abs Immature Granulocytes: 0.02 10*3/uL (ref 0.00–0.07)
Basophils Absolute: 0 10*3/uL (ref 0.0–0.1)
Basophils Relative: 0 %
Eosinophils Absolute: 0 10*3/uL (ref 0.0–0.5)
Eosinophils Relative: 1 %
HCT: 33.6 % — ABNORMAL LOW (ref 36.0–46.0)
Hemoglobin: 11.4 g/dL — ABNORMAL LOW (ref 12.0–15.0)
Immature Granulocytes: 1 %
Lymphocytes Relative: 25 %
Lymphs Abs: 0.8 10*3/uL (ref 0.7–4.0)
MCH: 30.5 pg (ref 26.0–34.0)
MCHC: 33.9 g/dL (ref 30.0–36.0)
MCV: 89.8 fL (ref 80.0–100.0)
Monocytes Absolute: 0.3 10*3/uL (ref 0.1–1.0)
Monocytes Relative: 9 %
Neutro Abs: 2.2 10*3/uL (ref 1.7–7.7)
Neutrophils Relative %: 64 %
Platelet Count: 211 10*3/uL (ref 150–400)
RBC: 3.74 MIL/uL — ABNORMAL LOW (ref 3.87–5.11)
RDW: 15.4 % (ref 11.5–15.5)
WBC Count: 3.3 10*3/uL — ABNORMAL LOW (ref 4.0–10.5)
nRBC: 0 % (ref 0.0–0.2)

## 2020-12-16 MED ORDER — HEPARIN SOD (PORK) LOCK FLUSH 100 UNIT/ML IV SOLN
500.0000 [IU] | Freq: Once | INTRAVENOUS | Status: AC
  Administered 2020-12-16: 500 [IU]
  Filled 2020-12-16: qty 5

## 2020-12-16 MED ORDER — SODIUM CHLORIDE 0.9% FLUSH
10.0000 mL | Freq: Once | INTRAVENOUS | Status: AC
  Administered 2020-12-16: 10 mL
  Filled 2020-12-16: qty 10

## 2020-12-16 NOTE — Patient Instructions (Signed)
Implanted Port Insertion, Care After This sheet gives you information about how to care for yourself after your procedure. Your health care provider may also give you more specific instructions. If you have problems or questions, contact your health care provider. What can I expect after the procedure? After the procedure, it is common to have:  Discomfort at the port insertion site.  Bruising on the skin over the port. This should improve over 3-4 days. Follow these instructions at home: Port care  After your port is placed, you will get a manufacturer's information card. The card has information about your port. Keep this card with you at all times.  Take care of the port as told by your health care provider. Ask your health care provider if you or a family member can get training for taking care of the port at home. A home health care nurse may also take care of the port.  Make sure to remember what type of port you have. Incision care  Follow instructions from your health care provider about how to take care of your port insertion site. Make sure you: ? Wash your hands with soap and water before and after you change your bandage (dressing). If soap and water are not available, use hand sanitizer. ? Change your dressing as told by your health care provider. ? Leave stitches (sutures), skin glue, or adhesive strips in place. These skin closures may need to stay in place for 2 weeks or longer. If adhesive strip edges start to loosen and curl up, you may trim the loose edges. Do not remove adhesive strips completely unless your health care provider tells you to do that.  Check your port insertion site every day for signs of infection. Check for: ? Redness, swelling, or pain. ? Fluid or blood. ? Warmth. ? Pus or a bad smell.      Activity  Return to your normal activities as told by your health care provider. Ask your health care provider what activities are safe for you.  Do not  lift anything that is heavier than 10 lb (4.5 kg), or the limit that you are told, until your health care provider says that it is safe. General instructions  Take over-the-counter and prescription medicines only as told by your health care provider.  Do not take baths, swim, or use a hot tub until your health care provider approves. Ask your health care provider if you may take showers. You may only be allowed to take sponge baths.  Do not drive for 24 hours if you were given a sedative during your procedure.  Wear a medical alert bracelet in case of an emergency. This will tell any health care providers that you have a port.  Keep all follow-up visits as told by your health care provider. This is important. Contact a health care provider if:  You cannot flush your port with saline as directed, or you cannot draw blood from the port.  You have a fever or chills.  You have redness, swelling, or pain around your port insertion site.  You have fluid or blood coming from your port insertion site.  Your port insertion site feels warm to the touch.  You have pus or a bad smell coming from the port insertion site. Get help right away if:  You have chest pain or shortness of breath.  You have bleeding from your port that you cannot control. Summary  Take care of the port as told by your   health care provider. Keep the manufacturer's information card with you at all times.  Change your dressing as told by your health care provider.  Contact a health care provider if you have a fever or chills or if you have redness, swelling, or pain around your port insertion site.  Keep all follow-up visits as told by your health care provider. This information is not intended to replace advice given to you by your health care provider. Make sure you discuss any questions you have with your health care provider. Document Revised: 05/17/2018 Document Reviewed: 05/17/2018 Elsevier Patient Education   2021 Elsevier Inc.  

## 2020-12-17 ENCOUNTER — Ambulatory Visit: Payer: BC Managed Care – PPO

## 2020-12-17 ENCOUNTER — Ambulatory Visit
Admission: RE | Admit: 2020-12-17 | Discharge: 2020-12-17 | Disposition: A | Discharge status: Home / Self Care | Payer: BC Managed Care – PPO | Source: Ambulatory Visit | Attending: Radiation Oncology | Admitting: Radiation Oncology

## 2020-12-17 ENCOUNTER — Other Ambulatory Visit: Payer: Self-pay

## 2020-12-17 ENCOUNTER — Telehealth: Payer: Self-pay | Admitting: Hematology

## 2020-12-17 DIAGNOSIS — Z170 Estrogen receptor positive status [ER+]: Secondary | ICD-10-CM

## 2020-12-17 DIAGNOSIS — R293 Abnormal posture: Secondary | ICD-10-CM

## 2020-12-17 DIAGNOSIS — R600 Localized edema: Secondary | ICD-10-CM

## 2020-12-17 DIAGNOSIS — C50412 Malignant neoplasm of upper-outer quadrant of left female breast: Secondary | ICD-10-CM

## 2020-12-17 DIAGNOSIS — C50912 Malignant neoplasm of unspecified site of left female breast: Secondary | ICD-10-CM | POA: Diagnosis not present

## 2020-12-17 DIAGNOSIS — C50911 Malignant neoplasm of unspecified site of right female breast: Secondary | ICD-10-CM

## 2020-12-17 LAB — CANCER ANTIGEN 27.29: CA 27.29: 1387.9 U/mL — ABNORMAL HIGH (ref 0.0–38.6)

## 2020-12-17 NOTE — Telephone Encounter (Signed)
Checked out appointment. No LOS notes to schedule. No changes made. 

## 2020-12-17 NOTE — Therapy (Signed)
[facility] [address] [place], [place], [address] Phone: [phone]   Fax:  [phone]  Physical Therapy Treatment  Patient Details  Name: Victoria Coffey MRN: [id number] Date of Birth: 02/17/1956 Referring Provider (PT): Dr Mosetta Putt   Encounter Date: 12/17/2020   PT End of Session - 12/17/20 1240    Visit Number 2    Number of Visits 13    Date for PT Re-Evaluation 01/16/21    PT Start Time 1011    PT Stop Time 1115    PT Time Calculation (min) 64 min    Activity Tolerance Patient tolerated treatment well    Behavior During Therapy [facility] for tasks assessed/performed           Past Medical History:  Diagnosis Date  . Aortic atherosclerosis (HCC)   . Breast cancer (HCC) dx'd 1993   left  . Fatty liver   . History of left breast cancer   . HIV infection (HCC)   . Hypertension   . Port-A-Cath in place     Past Surgical History:  Procedure Laterality Date  . AXILLARY LYMPH NODE DISSECTION Right 10/14/2020   Procedure: left axilla removal of two drains right axillary lymph node dissection;  Surgeon: Almond Lint, MD;  Location: [facility];  Service: General;  Laterality: Right;  PEC BLOCK, RNFA OR PA  . BREAST BIOPSY Bilateral 03/22/2020   Procedure: BILATERAL BREAST PUNCH BIOPSIES;  Surgeon: Almond Lint, MD;  [facility]: [facility];  Service: General;  Laterality: Bilateral;  . BREAST LUMPECTOMY    . CESAREAN SECTION    . left breast lumpectomy  1993  . MASTECTOMY W/ SENTINEL NODE BIOPSY Bilateral 10/01/2020  . MASTECTOMY W/ SENTINEL NODE BIOPSY Bilateral 10/01/2020   Procedure: BILATERAL MASTECTOMIES WITH RIGHT SENTINEL LYMPH NODE BIOPSIES,RIGHT TARGETED RADIOACTIVE SEED LYMPH NODE, LEFT BREAST MODIFIED RADICAL MASTECTOMY ;  Surgeon: Almond Lint, MD;  Location: [facility];  Service: General;  Laterality: Bilateral;  PEC BLOCK, RNFA  . PORTACATH PLACEMENT Right 03/22/2020   Procedure: INSERTION PORT-A-CATH WITH ULTRASOUND  GUIDANCE;  Surgeon: Almond Lint, MD;  Location: [facility];  Service: General;  Laterality: Right;  . RADIOACTIVE SEED GUIDED AXILLARY SENTINEL LYMPH NODE Right 10/01/2020   Procedure: RADIOACTIVE SEED GUIDED  TARGETED RIGHT SENTINEL LYMPH NODE BIOPSY;  Surgeon: Almond Lint, MD;  [facility];  Service: General;  Laterality: Right;  PEC BLOCK, RNFA    There were no vitals filed for this visit.   Subjective Assessment - 12/17/20 1016    Subjective I got the compression sleeve the same day I was here last time and have been wearing that every day and I can tell my arm isn't as swollen now.    Pertinent History Patient was diagnosed on 02/28/2020 with left invasive ductal carcinoma breast cancer. The mass measured 3.9 cm and was located in the upper outer quadrant. It was ER positive, PR negative, and HER2 negative with a Ki67 of 40%. Her left breast appeared to be retracted with significant skin changes. She also had 3 abnormal appearing nodes in the RIGHT axilla and 1 was biopsied and found to be positive for carcinoma. She has a history of left breast cancer in 1993 when she underwent a left lumpectomy, ALND, chemotherapy and radiation. For most recent diagnosis She had neoadjuvant chemotherapy 03/22/20-08/23/20.Pt is now s/p Left breast modified radical mastectomy and right mastectomy with 3+ LN of 6 removed on  10/01/20.  On 10/14/20 she underwent a right ALND. She  is presently undergoing radiation.                 LYMPHEDEMA/ONCOLOGY QUESTIONNAIRE - 12/17/20 0001      Right Upper Extremity Lymphedema   15 cm Proximal to Olecranon Process 29.2 cm    10 cm Proximal to Olecranon Process 28.4 cm    Olecranon Process 23.4 cm    15 cm Proximal to Ulnar Styloid Process 23.9 cm    10 cm Proximal to Ulnar Styloid Process 19.4 cm    Just Proximal to Ulnar Styloid Process 14.7 cm    Across Hand at [employer] 18.4 cm    At [place] of 2nd Digit 6 cm                      [facility]  Adult PT Treatment/Exercise - 12/17/20 0001      Manual Therapy   Manual Therapy Manual Lymphatic Drainage (MLD);Passive ROM;Myofascial release    Myofascial Release To Rt axilla during P/ROM    Manual Lymphatic Drainage (MLD) In Supine: Short neck, 5 diaphragmatic breaths, Rt inguinal nodes, Rt axillo-inguinal anastomosis, then Rt Ue working from proximal to distal then retracing all steps. Began instructing pt in this while performing then had her return demo.    Passive ROM In Supine to Rt shoulder into flexion, abduction and D2 to pts tolerance                  PT Education - 12/17/20 1238    Education Details (Self) Manual lymph drainage to Rt UE    Person(s) Educated Patient    Methods Explanation;Demonstration;Handout    Comprehension Verbalized understanding;Returned demonstration;Need further instruction               PT Long Term Goals - 12/05/20 1835      PT LONG TERM GOAL #1   Title Patient will demonstrate she has regained full shoulder ROM and function post operatively compared to baselines.    Time 6    Period Weeks    Status New    Target Date 01/16/21      PT LONG TERM GOAL #2   Title pt will be independent in self MLD  to right UE and trunk    Time 4    Period Weeks    Status New    Target Date 01/02/21      PT LONG TERM GOAL #3   Title Pt will have minimal difference in circumference of both arms    Time 6    Period Weeks    Status New    Target Date 01/16/21      PT LONG TERM GOAL #4   Title Pt will understand precautions for skin care and decreasing risk of infection/lymphedema    Time 6    Period Weeks    Status New    Target Date 01/16/21      PT LONG TERM GOAL #5   Title Pt will be independent in HEP for shoulder ROM and strengthening    Time 6                 Plan - 12/17/20 1240    Clinical Impression Statement Began manual lymph drainage of Rt breast and P/ROM to Rt shoulder/MFR to Rt axilla. Educated pt on basics  of anatomy of lymphatic system and principles of MLD. Also had pt return demo of MLD sequence which she was able to do with correct pressure, just  required reminders for lighter pressure. Her Rt UE circumference measurements have reduced since last measured last week as she got her compression sleeve and has been wearing this daily.    Personal Factors and Comorbidities Comorbidity 1;Comorbidity 3+    Comorbidities Breast CA reoccurence, Aids, aortic atherosclerosis    Stability/Clinical Decision Making Stable/Uncomplicated    Rehab Potential Good    PT Frequency 2x / week    PT Duration 6 weeks    PT Treatment/Interventions ADLs/Self Care Home Management;Therapeutic activities;Therapeutic exercise;Neuromuscular re-education;Manual techniques;Orthotic Fit/Training;Patient/family education;Manual lymph drainage;Compression bandaging;Scar mobilization;Passive range of motion    PT Next Visit Plan Cont and review self MLD to Rt UE (no anterior inter-axillary anastomosis) having pt return demo; cont Rt shoulder P/ROM/MFR and progress to AA/A/ROM.    PT Home Exercise Plan Post op shoulder ROM HEP    Consulted and Agree with Plan of Care Patient           Patient will benefit from skilled therapeutic intervention in order to improve the following deficits and impairments:  Decreased knowledge of precautions,Decreased range of motion,Decreased skin integrity,Decreased scar mobility,Decreased strength,Increased edema,Increased fascial restricitons,Postural dysfunction  Visit Diagnosis: Inflammatory breast cancer, left (HCC)  Localized edema  Abnormal posture  Inflammatory breast cancer, right (HCC)  Malignant neoplasm of upper-outer quadrant of left breast in female, estrogen receptor positive ([place])     Problem List Patient Active Problem List   Diagnosis Date Noted  . Primary malignant neoplasm of upper outer quadrant of right breast ([place]) 10/01/2020  . Drug-induced neutropenia ([place])  06/28/2020  . Thrombocytopenia ([place]) 06/07/2020  . Port-A-Cath in place 04/19/2020  . Malignant neoplasm of upper-outer quadrant of left breast in female, estrogen receptor negative ([place]) 03/05/2020  . Rash and nonspecific skin eruption 11/08/2013  . History of breast cancer 12/20/2012  . HYPERTENSION, BENIGN ESSENTIAL 08/11/2007  . HIV DISEASE 04/14/2007    Otelia Limes, PTA 12/17/2020, 12:49 PM  [place], [place], [address] Phone: [phone]   Fax:  [phone]  Name: Victoria Coffey MRN: [id number] Date of Birth: July 18, 1956

## 2020-12-17 NOTE — Patient Instructions (Signed)
Start with circles near neck and above collarbones, 5-10 times.    Cancer Rehab [id number] Deep Effective Breath   Standing, sitting, or laying down, place both hands on the belly. Take a deep breath IN, expanding the belly; then breath OUT, contracting the belly. Repeat __5__ times. Do __2-3__ sessions per day and before your self massage.     On involved side, make 5 circles at groin at panty line, then pump _5__ times from armpit along side of trunk to outer hip, making your other pathway. Do __1_ time per day.  Copyright  VHI. All rights reserved.  Arm Posterior: Elbow to Shoulder - Sweep   Pump _5__ times from back of elbow to top of shoulder. Then inner to outer upper arm _5_ times, then outer arm again _5_ times. Then back to the pathways _2-3_ times. Do _1__ time per day.  Copyright  VHI. All rights reserved.  ARM: [place] Wrist to Elbow - Sweep   Pump or stationary circles _5__ times from wrist to elbow making sure to do both sides of the forearm. Then retrace your steps to the outer arm, and the pathways _2-3_ times each. Do _1__ time per day.  Copyright  VHI. All rights reserved.  ARM: Dorsum of Hand to Shoulder - Sweep   Pump or stationary circles _5__ times on back of hand including knuckle spaces and individual fingers if needed working up towards the wrist, then retrace all your steps working back up the forearm, doing both sides; upper outer arm and back to your pathways _2-3_ times each. Then do 5 circles again at uninvolved armpit and involved groin where you started! Good job!! Do __1_ time per day.

## 2020-12-18 ENCOUNTER — Ambulatory Visit: Payer: BC Managed Care – PPO

## 2020-12-18 ENCOUNTER — Ambulatory Visit
Admission: RE | Admit: 2020-12-18 | Discharge: 2020-12-18 | Disposition: A | Discharge status: Home / Self Care | Payer: BC Managed Care – PPO | Source: Ambulatory Visit | Attending: Radiation Oncology | Admitting: Radiation Oncology

## 2020-12-18 ENCOUNTER — Other Ambulatory Visit: Payer: Self-pay

## 2020-12-18 ENCOUNTER — Inpatient Hospital Stay: Payer: BC Managed Care – PPO

## 2020-12-18 DIAGNOSIS — B20 Human immunodeficiency virus [HIV] disease: Secondary | ICD-10-CM

## 2020-12-18 DIAGNOSIS — D702 Other drug-induced agranulocytosis: Secondary | ICD-10-CM

## 2020-12-18 DIAGNOSIS — C50412 Malignant neoplasm of upper-outer quadrant of left female breast: Secondary | ICD-10-CM | POA: Diagnosis not present

## 2020-12-18 MED ORDER — CILGAVIMAB (PART OF EVUSHELD) INJECTION
150.0000 mg | Freq: Once | INTRAMUSCULAR | Status: AC
  Administered 2020-12-18: 150 mg via INTRAMUSCULAR
  Filled 2020-12-18: qty 1.5

## 2020-12-18 MED ORDER — TIXAGEVIMAB (PART OF EVUSHELD) INJECTION
150.0000 mg | Freq: Once | INTRAMUSCULAR | Status: AC
  Administered 2020-12-18: 150 mg via INTRAMUSCULAR
  Filled 2020-12-18: qty 1.5

## 2020-12-18 NOTE — Progress Notes (Signed)
Pt discharged in no apparent distress. Pt left ambulatory without assistance.  Pt aware of discharge instructions and verbalized understanding and had no further questions. Pt waited full 1 hour post injection with no issues

## 2020-12-19 ENCOUNTER — Other Ambulatory Visit: Payer: Self-pay | Admitting: Internal Medicine

## 2020-12-19 ENCOUNTER — Ambulatory Visit: Payer: BC Managed Care – PPO

## 2020-12-19 ENCOUNTER — Other Ambulatory Visit: Payer: Self-pay

## 2020-12-19 ENCOUNTER — Ambulatory Visit
Admission: RE | Admit: 2020-12-19 | Discharge: 2020-12-19 | Disposition: A | Discharge status: Home / Self Care | Payer: BC Managed Care – PPO | Source: Ambulatory Visit | Attending: Radiation Oncology | Admitting: Radiation Oncology

## 2020-12-19 DIAGNOSIS — R600 Localized edema: Secondary | ICD-10-CM

## 2020-12-19 DIAGNOSIS — C50412 Malignant neoplasm of upper-outer quadrant of left female breast: Secondary | ICD-10-CM

## 2020-12-19 DIAGNOSIS — R293 Abnormal posture: Secondary | ICD-10-CM

## 2020-12-19 DIAGNOSIS — C50911 Malignant neoplasm of unspecified site of right female breast: Secondary | ICD-10-CM

## 2020-12-19 DIAGNOSIS — C50912 Malignant neoplasm of unspecified site of left female breast: Secondary | ICD-10-CM

## 2020-12-19 DIAGNOSIS — Z170 Estrogen receptor positive status [ER+]: Secondary | ICD-10-CM

## 2020-12-19 NOTE — Therapy (Signed)
[place], [place], [address] Phone: [phone]   Fax:  [phone]  Physical Therapy Treatment  Patient Details  Name: Victoria Coffey MRN: [id number] Date of Birth: 1956-04-20 Referring Provider (PT): Dr Burr Medico   Encounter Date: 12/19/2020   PT End of Session - 12/19/20 1210    Visit Number 3    Number of Visits 13    Date for PT Re-Evaluation 01/16/21    PT Start Time 1105    PT Stop Time 1200    PT Time Calculation (min) 55 min    Activity Tolerance Patient tolerated treatment well    Behavior During Therapy [facility] for tasks assessed/performed           Past Medical History:  Diagnosis Date  . Aortic atherosclerosis ([place])   . Breast cancer ([place]) dx'd 1993   left  . Fatty liver   . History of left breast cancer   . HIV infection ([place])   . Hypertension   . Port-A-Cath in place     Past Surgical History:  Procedure Laterality Date  . AXILLARY LYMPH NODE DISSECTION Right 10/14/2020   Procedure: left axilla removal of two drains right axillary lymph node dissection;  Surgeon: [name], MD;  [facility]: [facility];  Service: General;  Laterality: Right;  PEC BLOCK, RNFA OR PA  . BREAST BIOPSY Bilateral 03/22/2020   Procedure: BILATERAL BREAST PUNCH BIOPSIES;  Surgeon: [name], MD;  Location: [place];  Service: General;  Laterality: Bilateral;  . BREAST LUMPECTOMY    . CESAREAN SECTION    . left breast lumpectomy  1993  . MASTECTOMY W/ SENTINEL NODE BIOPSY Bilateral 10/01/2020  . MASTECTOMY W/ SENTINEL NODE BIOPSY Bilateral 10/01/2020   Procedure: BILATERAL MASTECTOMIES WITH RIGHT SENTINEL LYMPH NODE BIOPSIES,RIGHT TARGETED RADIOACTIVE SEED LYMPH NODE, LEFT BREAST MODIFIED RADICAL MASTECTOMY ;  Surgeon: [name], MD;  Location: [place];  Service: General;  Laterality: Bilateral;  PEC BLOCK, RNFA  . PORTACATH PLACEMENT Right 03/22/2020   Procedure: INSERTION PORT-A-CATH WITH ULTRASOUND  GUIDANCE;  Surgeon: [name], MD;  Location: [place];  Service: General;  Laterality: Right;  . RADIOACTIVE SEED GUIDED AXILLARY SENTINEL LYMPH NODE Right 10/01/2020   Procedure: RADIOACTIVE SEED GUIDED  TARGETED RIGHT SENTINEL LYMPH NODE BIOPSY;  Surgeon: [name], MD;  Location: [place];  Service: General;  Laterality: Right;  PEC BLOCK, RNFA    There were no vitals filed for this visit.   Subjective Assessment - 12/19/20 1105    Subjective Wearing sleeve and it is comfortable for her.  Arm is not as swollen.   Think I can reach a little higher now.  My stamina has improved.  Her radiation is bilateral and is causing her chest to be sore and skin is getting darker.  It is not itchy.  Took the prosthesis off because it is irritating.    Pertinent History Patient was diagnosed on 02/28/2020 with left invasive ductal carcinoma breast cancer. The mass measured 3.9 cm and was located in the upper outer quadrant. It was ER positive, PR negative, and HER2 negative with a Ki67 of 40%. Her left breast appeared to be retracted with significant skin changes. She also had 3 abnormal appearing nodes in the RIGHT axilla and 1 was biopsied and found to be positive for carcinoma. She has a history of left breast cancer in 1993 when she underwent a left lumpectomy, ALND, chemotherapy and radiation. For most recent diagnosis She had neoadjuvant  chemotherapy 03/22/20-08/23/20.Pt is now s/p Left breast modified radical mastectomy and right mastectomy with 3+ LN of 6 removed on  10/01/20.  On 10/14/20 she underwent a right ALND. She is presently undergoing radiation.    Patient Stated Goals reduce lymphedema risk, and learn shoulder ROM and strengthening exs and safe way to progress, Decrease swelling    Currently in Pain? No/denies    Pain Score 0-No pain                             [facility] Adult PT Treatment/Exercise - 12/19/20 0001      Shoulder Exercises: Supine   Other Supine Exercises  supine wand flex and scaption x 5      Manual Therapy   Manual Therapy Manual Lymphatic Drainage (MLD);Passive ROM;Myofascial release    Myofascial Release Bilateral axilla during PROM    Manual Lymphatic Drainage (MLD) In Supine: Short neck, 5 diaphragmatic breaths, Rt inguinal nodes, Rt axillo-inguinal anastomosis, then Rt Ue working from proximal to distal then retracing all steps. Began instructing pt in this while performing then had her return demo.    Passive ROM In Supine to bilateral shoulder into flexion, abduction, ER and D2 to pts tolerance                  PT Education - 12/19/20 1208    Education Details Pt educated in supine flex and scaption with wand x 5, Reviewed MLD with pt Right UE and lateral trunk to right inguinal region only.  Pt did much better after practice with improved stretch and technique    Person(s) Educated Patient    Methods Explanation;Demonstration    Comprehension Verbalized understanding;Returned demonstration;Need further instruction               PT Long Term Goals - 12/05/20 1835      PT LONG TERM GOAL #1   Title Patient will demonstrate she has regained full shoulder ROM and function post operatively compared to baselines.    Time 6    Period Weeks    Status New    Target Date 01/16/21      PT LONG TERM GOAL #2   Title pt will be independent in self MLD  to right UE and trunk    Time 4    Period Weeks    Status New    Target Date 01/02/21      PT LONG TERM GOAL #3   Title Pt will have minimal difference in circumference of both arms    Time 6    Period Weeks    Status New    Target Date 01/16/21      PT LONG TERM GOAL #4   Title Pt will understand precautions for skin care and decreasing risk of infection/lymphedema    Time 6    Period Weeks    Status New    Target Date 01/16/21      PT LONG TERM GOAL #5   Title Pt will be independent in HEP for shoulder ROM and strengthening    Time 6                  Plan - 12/19/20 1211    Clinical Impression Statement Pt is compliant with sleeve wear on right and is awaiting a sleeve for her left.  She was instructed in supine wand exs today and demonstrated improved ROM with this versus clasped hands.  MLD was performed again on right side and techniques reviewed again with pt.  She required VC's and TC's to perform with proper stretch but improved greatly with practice.  Gentle MFR was performed to bilateral chest and PROM to bilateral shoulders.    Personal Factors and Comorbidities Comorbidity 1;Comorbidity 3+    Comorbidities Breast CA reoccurence, Aids, aortic atherosclerosis    Stability/Clinical Decision Making Stable/Uncomplicated    Clinical Decision Making Low    Rehab Potential Good    PT Frequency 2x / week    PT Duration 6 weeks    PT Treatment/Interventions ADLs/Self Care Home Management;Therapeutic activities;Therapeutic exercise;Neuromuscular re-education;Manual techniques;Orthotic Fit/Training;Patient/family education;Manual lymph drainage;Compression bandaging;Scar mobilization;Passive range of motion    PT Next Visit Plan Cont and review self MLD to Rt UE (no anterior inter-axillary anastomosis) having pt return demo; cont bilateral shoulder P/ROM/MFR and progress to AA/A/ROM.    PT Home Exercise Plan Post op shoulder ROM HEP, supine flex/scaption with wand, self MLD    Consulted and Agree with Plan of Care Patient           Patient will benefit from skilled therapeutic intervention in order to improve the following deficits and impairments:  Decreased knowledge of precautions,Decreased range of motion,Decreased skin integrity,Decreased scar mobility,Decreased strength,Increased edema,Increased fascial restricitons,Postural dysfunction  Visit Diagnosis: Inflammatory breast cancer, left (HCC)  Localized edema  Abnormal posture  Inflammatory breast cancer, right (HCC)  Malignant neoplasm of upper-outer quadrant of left breast in  female, estrogen receptor positive ([place])     Problem List Patient Active Problem List   Diagnosis Date Noted  . Primary malignant neoplasm of upper outer quadrant of right breast ([place]) 10/01/2020  . Drug-induced neutropenia ([place]) 06/28/2020  . Thrombocytopenia ([place]) 06/07/2020  . Port-A-Cath in place 04/19/2020  . Malignant neoplasm of upper-outer quadrant of left breast in female, estrogen receptor negative ([place]) 03/05/2020  . Rash and nonspecific skin eruption 11/08/2013  . History of breast cancer 12/20/2012  . HYPERTENSION, BENIGN ESSENTIAL 08/11/2007  . HIV DISEASE 04/14/2007    Claris Pong 12/19/2020, 12:15 PM  [place], [place], [address] Phone: [phone]   Fax:  [phone]  Name: Victoria Coffey MRN: [id number] Date of Birth: 05-23-1956 Cheral Almas, PT 12/19/20 12:18 PM

## 2020-12-20 ENCOUNTER — Ambulatory Visit: Payer: BC Managed Care – PPO

## 2020-12-20 ENCOUNTER — Ambulatory Visit
Admission: RE | Admit: 2020-12-20 | Discharge: 2020-12-20 | Disposition: A | Discharge status: Home / Self Care | Payer: BC Managed Care – PPO | Source: Ambulatory Visit | Attending: Radiation Oncology | Admitting: Radiation Oncology

## 2020-12-20 DIAGNOSIS — C50412 Malignant neoplasm of upper-outer quadrant of left female breast: Secondary | ICD-10-CM | POA: Diagnosis not present

## 2020-12-20 MED FILL — CAPECITABINE 500 MG TABLET: 500 | 21 days supply | Qty: 75 | Fill #1

## 2020-12-23 ENCOUNTER — Other Ambulatory Visit: Payer: Self-pay

## 2020-12-23 ENCOUNTER — Ambulatory Visit: Payer: BC Managed Care – PPO

## 2020-12-23 ENCOUNTER — Ambulatory Visit
Admission: RE | Admit: 2020-12-23 | Discharge: 2020-12-23 | Disposition: A | Discharge status: Home / Self Care | Payer: BC Managed Care – PPO | Source: Ambulatory Visit | Attending: Radiation Oncology | Admitting: Radiation Oncology

## 2020-12-23 DIAGNOSIS — C50412 Malignant neoplasm of upper-outer quadrant of left female breast: Secondary | ICD-10-CM | POA: Diagnosis not present

## 2020-12-24 ENCOUNTER — Ambulatory Visit: Payer: BC Managed Care – PPO

## 2020-12-24 ENCOUNTER — Ambulatory Visit
Admission: RE | Admit: 2020-12-24 | Discharge: 2020-12-24 | Disposition: A | Discharge status: Home / Self Care | Payer: BC Managed Care – PPO | Source: Ambulatory Visit | Attending: Radiation Oncology | Admitting: Radiation Oncology

## 2020-12-24 ENCOUNTER — Other Ambulatory Visit: Payer: Self-pay

## 2020-12-24 DIAGNOSIS — C50912 Malignant neoplasm of unspecified site of left female breast: Secondary | ICD-10-CM | POA: Diagnosis not present

## 2020-12-24 DIAGNOSIS — R293 Abnormal posture: Secondary | ICD-10-CM

## 2020-12-24 DIAGNOSIS — R600 Localized edema: Secondary | ICD-10-CM

## 2020-12-24 DIAGNOSIS — C50412 Malignant neoplasm of upper-outer quadrant of left female breast: Secondary | ICD-10-CM | POA: Diagnosis not present

## 2020-12-24 DIAGNOSIS — Z170 Estrogen receptor positive status [ER+]: Secondary | ICD-10-CM

## 2020-12-24 DIAGNOSIS — C50911 Malignant neoplasm of unspecified site of right female breast: Secondary | ICD-10-CM

## 2020-12-24 NOTE — Therapy (Signed)
[place], [place], [address] Phone: [phone]   Fax:  [phone]  Physical Therapy Treatment  Patient Details  Name: Victoria Coffey MRN: [id number] Date of Birth: 01-Sep-1956 Referring Provider (PT): Dr Burr Medico   Encounter Date: 12/24/2020   PT End of Session - 12/24/20 1202    Visit Number 4    Number of Visits 13    Date for PT Re-Evaluation 01/16/21    PT Start Time 1102    PT Stop Time 1159    PT Time Calculation (min) 57 min    Activity Tolerance Patient tolerated treatment well    Behavior During Therapy [facility] for tasks assessed/performed           Past Medical History:  Diagnosis Date  . Aortic atherosclerosis ([place])   . Breast cancer ([place]) dx'd 1993   left  . Fatty liver   . History of left breast cancer   . HIV infection ([place])   . Hypertension   . Port-A-Cath in place     Past Surgical History:  Procedure Laterality Date  . AXILLARY LYMPH NODE DISSECTION Right 10/14/2020   Procedure: left axilla removal of two drains right axillary lymph node dissection;  Surgeon: [name], MD;  Location: [facility];  Service: General;  Laterality: Right;  PEC BLOCK, RNFA OR PA  . BREAST BIOPSY Bilateral 03/22/2020   Procedure: BILATERAL BREAST PUNCH BIOPSIES;  Surgeon: [name], MD;  Location: [place];  Service: General;  Laterality: Bilateral;  . BREAST LUMPECTOMY    . CESAREAN SECTION    . left breast lumpectomy  1993  . MASTECTOMY W/ SENTINEL NODE BIOPSY Bilateral 10/01/2020  . MASTECTOMY W/ SENTINEL NODE BIOPSY Bilateral 10/01/2020   Procedure: BILATERAL MASTECTOMIES WITH RIGHT SENTINEL LYMPH NODE BIOPSIES,RIGHT TARGETED RADIOACTIVE SEED LYMPH NODE, LEFT BREAST MODIFIED RADICAL MASTECTOMY ;  Surgeon: [name], MD;  Location: [place];  Service: General;  Laterality: Bilateral;  PEC BLOCK, RNFA  . PORTACATH PLACEMENT Right 03/22/2020   Procedure: INSERTION PORT-A-CATH WITH ULTRASOUND  GUIDANCE;  Surgeon: [name], MD;  Location: [place];  Service: General;  Laterality: Right;  . RADIOACTIVE SEED GUIDED AXILLARY SENTINEL LYMPH NODE Right 10/01/2020   Procedure: RADIOACTIVE SEED GUIDED  TARGETED RIGHT SENTINEL LYMPH NODE BIOPSY;  Surgeon: [name], MD;  Location: [place];  Service: General;  Laterality: Right;  PEC BLOCK, RNFA    There were no vitals filed for this visit.   Subjective Assessment - 12/24/20 1102    Subjective Radiation is going well.  Have a little shadow from radiation but otherwise skin is doing well.  Got a little nauseous yesterday smelling food after radiation.  Got a little tired after radiation yesterday.  The sleeve has been comfortable. Shoulder ROM is going well I think. Trying to do  MLD but not sure I am doing correctly.   Pertinent History Patient was diagnosed on 02/28/2020 with left invasive ductal carcinoma breast cancer. The mass measured 3.9 cm and was located in the upper outer quadrant. It was ER positive, PR negative, and HER2 negative with a Ki67 of 40%. Her left breast appeared to be retracted with significant skin changes. She also had 3 abnormal appearing nodes in the RIGHT axilla and 1 was biopsied and found to be positive for carcinoma. She has a history of left breast cancer in 1993 when she underwent a left lumpectomy, ALND, chemotherapy and radiation. For most recent diagnosis She had neoadjuvant chemotherapy 03/22/20-08/23/20.Pt  is now s/p Left breast modified radical mastectomy and right mastectomy with 3+ LN of 6 removed on  10/01/20.  On 10/14/20 she underwent a right ALND. She is presently undergoing radiation.    Patient Stated Goals reduce lymphedema risk, and learn shoulder ROM and strengthening exs and safe way to progress, Decrease swelling    Currently in Pain? No/denies    Pain Score 0-No pain                             [facility] Adult PT Treatment/Exercise - 12/24/20 0001      Shoulder Exercises: Supine    Other Supine Exercises supine wand flex and scaption x 5      Manual Therapy   Manual Therapy Manual Lymphatic Drainage (MLD);Passive ROM;Myofascial release    Myofascial Release Bilateral axilla during PROM    Manual Lymphatic Drainage (MLD) In Supine: Short neck, 5 diaphragmatic breaths, Rt inguinal nodes, Rt axillo-inguinal anastomosis, then right lateral trunk in SL, and supine Rt Ue working from proximal to distal then retracing all steps. Began instructing pt in this while performing then had her return demo.    Passive ROM In Supine to bilateral shoulder into flexion, abduction, ER and D2 to pts tolerance                       PT Long Term Goals - 12/05/20 1835      PT LONG TERM GOAL #1   Title Patient will demonstrate she has regained full shoulder ROM and function post operatively compared to baselines.    Time 6    Period Weeks    Status New    Target Date 01/16/21      PT LONG TERM GOAL #2   Title pt will be independent in self MLD  to right UE and trunk    Time 4    Period Weeks    Status New    Target Date 01/02/21      PT LONG TERM GOAL #3   Title Pt will have minimal difference in circumference of both arms    Time 6    Period Weeks    Status New    Target Date 01/16/21      PT LONG TERM GOAL #4   Title Pt will understand precautions for skin care and decreasing risk of infection/lymphedema    Time 6    Period Weeks    Status New    Target Date 01/16/21      PT LONG TERM GOAL #5   Title Pt will be independent in HEP for shoulder ROM and strengthening    Time 6                 Plan - 12/24/20 1202    Clinical Impression Statement Therapy consisted of MFR to bilateral axillary regions and PROM bilateral shoulders.  Also performed MLD and instructed pt while performing and had her practice proper stretch and hand placement.  She is improving but continues to require VC's and TC's for technique. Pt was instructed in scar massage to  drain incisions but will do without lotion secondary to radiation. Bilateral pec muscles are tight and she would benefit from some chest stretching, MFR to chest    Personal Factors and Comorbidities Comorbidity 1;Comorbidity 3+    Comorbidities Breast CA reoccurence, Aids, aortic atherosclerosis    Stability/Clinical Decision Making Stable/Uncomplicated  Rehab Potential Good    PT Frequency 2x / week    PT Duration 6 weeks    PT Treatment/Interventions ADLs/Self Care Home Management;Therapeutic activities;Therapeutic exercise;Neuromuscular re-education;Manual techniques;Orthotic Fit/Training;Patient/family education;Manual lymph drainage;Compression bandaging;Scar mobilization;Passive range of motion    PT Next Visit Plan Cont and review self MLD to Rt UE (no anterior inter-axillary anastomosis) having pt return demo; cont bilateral shoulder P/ROM/MFR to chest,axilla, bilateral chest stretch,Supine flex/scaption /Horizontal abd AROM  progressing to band when ready    PT Home Exercise Plan Post op shoulder ROM HEP, supine flex/scaption with wand, self MLD    Recommended Other Services awaiting second sleeve    Consulted and Agree with Plan of Care Patient           Patient will benefit from skilled therapeutic intervention in order to improve the following deficits and impairments:  Decreased knowledge of precautions,Decreased range of motion,Decreased skin integrity,Decreased scar mobility,Decreased strength,Increased edema,Increased fascial restricitons,Postural dysfunction  Visit Diagnosis: Inflammatory breast cancer, left ([facility])  Localized edema  Abnormal posture  Inflammatory breast cancer, right ([place])  Malignant neoplasm of upper-outer quadrant of left breast in female, estrogen receptor positive ([place])     Problem List Patient Active Problem List   Diagnosis Date Noted  . Primary malignant neoplasm of upper outer quadrant of right breast ([place]) 10/01/2020  . Drug-induced  neutropenia ([place]) 06/28/2020  . Thrombocytopenia ([place]) 06/07/2020  . Port-A-Cath in place 04/19/2020  . Malignant neoplasm of upper-outer quadrant of left breast in female, estrogen receptor negative ([place]) 03/05/2020  . Rash and nonspecific skin eruption 11/08/2013  . History of breast cancer 12/20/2012  . HYPERTENSION, BENIGN ESSENTIAL 08/11/2007  . HIV DISEASE 04/14/2007    Claris Pong 12/24/2020, 12:12 PM  [address] [place], [place], [address] Phone: [phone]   Fax:  [phone]  Name: ELIENAI GAILEY MRN: [id number] Date of Birth: 03-Feb-1956 Cheral [place]mas, PT 12/24/20 12:17 PM

## 2020-12-25 ENCOUNTER — Ambulatory Visit: Payer: BC Managed Care – PPO

## 2020-12-25 ENCOUNTER — Ambulatory Visit
Admission: RE | Admit: 2020-12-25 | Discharge: 2020-12-25 | Disposition: A | Discharge status: Home / Self Care | Payer: BC Managed Care – PPO | Source: Ambulatory Visit | Attending: Radiation Oncology | Admitting: Radiation Oncology

## 2020-12-25 ENCOUNTER — Other Ambulatory Visit: Payer: Self-pay

## 2020-12-25 DIAGNOSIS — C50412 Malignant neoplasm of upper-outer quadrant of left female breast: Secondary | ICD-10-CM | POA: Diagnosis not present

## 2020-12-25 NOTE — Progress Notes (Signed)
Victoria Coffey   [phone] [phone]   Clinic Follow up Note   Patient Care Team: [name], MD as PCP - General (Infectious Diseases) [name], MD as PCP - Infectious Diseases (Infectious Diseases) [name], MD as Consulting Physician (General Surgery) [name], MD as Consulting Physician (Hematology) [name], MD as Consulting Physician (Radiation Oncology) [name], RN as Oncology Nurse Navigator [name], RN as Oncology Nurse Navigator  Date of Service:  12/26/2020  CHIEF COMPLAINT: F/u ofbilateralbreast cancer  SUMMARY OF ONCOLOGIC HISTORY: Oncology History Overview Note  Cancer Staging Bilateral breast cancer [facility]) Staging form: Breast, AJCC 8th Edition - Pathologic stage from 10/01/2020: Stage IIIC (pT3, pN2a, cM0, G3, ER-, PR-, HER2-) - Signed by Alla Feeling, NP on 10/24/2020  Malignant neoplasm of upper-outer quadrant of left breast in female, estrogen receptor positive ([place]) Staging form: Breast, AJCC 8th Edition - Clinical stage from 03/01/2020: Stage IIIC (cT4, cN0, cM0, G3, ER+, PR-, HER2-) - Signed by [name], MD on 08/22/2020 - Pathologic stage from 10/01/2020: Stage IIIC (pT4b, pN0, cM0, G3, ER-, PR-, HER2-) - Signed by Alla Feeling, NP on 10/24/2020    Malignant neoplasm of upper-outer quadrant of left breast in female, estrogen receptor negative ([place])  02/28/2020 Mammogram   Diagnostic Mammogram 02/28/20  IMPRESSION The 3.9 cm ill defined mass in the left breast posterior depth central 4.3cm to the nipple seen on the mediolateral oblique view onlt with associated difssue skin thickening is highly suspicious for malignancy.    03/01/2020 Cancer Staging   Staging form: Breast, AJCC 8th Edition - Clinical stage from 03/01/2020: Stage IIIC (cT4, cN0, cM0, G3, ER+, PR-, HER2-) - Signed by [name], MD on 08/22/2020   03/01/2020 Initial Biopsy   Diagnosis 03/01/20  1. Breast, left,  needle core biopsy, 9 o'clock, 3-4cmfn - INVASIVE DUCTAL CARCINOMA. SEE NOTE 2. Lymph node, needle/core biopsy, right axilla - INVASIVE DUCTAL CARCINOMA. SEE NOTE Diagnosis Note 1. Invasive carcinoma measures 1.5 cm in greatest linear dimension and appears grade 3. Dr. [name] reviewed the case and concurs with the diagnosis. A breast prognostic profile (ER, PR, Ki-67 and HER2) is pending and will be reported in an addendum. Dr. [name] was notified on 03/04/2020. 2. Carcinoma measures 0.6 cm in greatest linear dimension and appears grade 3. Lymphoid tissue is not identified - this may represent a completely replaced lymph node. A breast prognostic profile (ER, PR, Ki-67 and HER2) is pending and will be reported in an addendum. Dr. [name] reviewed the case and concurs with the diagnosis. Dr. [name] was notified on 03/04/2020.   03/01/2020 Receptors her2   1. PROGNOSTIC INDICATORS Results: IMMUNOHISTOCHEMICAL AND MORPHOMETRIC ANALYSIS PERFORMED MANUALLY The tumor cells are NEGATIVE for Her2 (0). Estrogen Receptor: 20%, POSITIVE, WEAK STAINING INTENSITY Progesterone Receptor: 0%, NEGATIVE Proliferation Marker Ki67: 40% COMMENT: The negative hormone receptor study(ies) in this case has an internal positive control.    2. PROGNOSTIC INDICATORS Results: IMMUNOHISTOCHEMICAL AND MORPHOMETRIC ANALYSIS PERFORMED MANUALLY The tumor cells are NEGATIVE for Her2 (1+). Estrogen Receptor: 10%, POSITIVE, WEAK STAINING INTENSITY Progesterone Receptor: 0%, NEGATIVE Proliferation Marker Ki67: 40% COMMENT: The negative hormone receptor study(ies) in this case has no internal positive control.   03/05/2020 Initial Diagnosis   Malignant neoplasm of upper-outer quadrant of left breast in female, estrogen receptor positive ([place])   03/18/2020 Breast MRI   IMPRESSION: 1. 7 x 9 x 6 cm area of suspicious non masslike enhancement within the central/UPPER RIGHT breast  with anterior skin thickening.  Given biopsy-proven metastatic RIGHT axillary lymph node, tissue sampling of the anterior and posterior aspects of this non masslike RIGHT breast enhancement is recommended to exclude malignancy. 2. 7 x 6 x 5 cm diffuse masslike and nonmasslike enhancement within the majority of the remaining LEFT breast with diffuse LEFT breast skin thickening, compatible with malignancy. Abnormal enhancement is noted along the anterior aspect of the LEFT pectoralis muscle. 3. Three abnormal RIGHT axillary lymph nodes, 1 which is biopsy proven to be metastatic disease. No abnormal LEFT axillary lymph nodes identified.   03/20/2020 Echocardiogram   Baseline ECHO  IMPRESSIONS     1. The patient was reported to be in pain during the study and not able  to participate in this study. All that can be said is that the LV and RV  function are grossly normal. Would recommend to repeat this study when/if  the patient is able to tolerate  the exam.   2. Left ventricular ejection fraction, by estimation, is 60 to 65%. The  left ventricle has normal function. Left ventricular endocardial border  not optimally defined to evaluate regional wall motion. Left ventricular  diastolic function could not be  evaluated.   3. Right ventricular systolic function is normal. The right ventricular  size is normal.   4. The mitral valve was not well visualized. No evidence of mitral valve  regurgitation.   5. The aortic valve was not well visualized. Aortic valve regurgitation  is not visualized.   6. The inferior vena cava is normal in size with greater than 50%  respiratory variability, suggesting right atrial pressure of 3 mmHg.    03/20/2020 Imaging   CT CAP W contrast  IMPRESSION: 1. Small right axillary/subpectoral lymph nodes. Difficult to exclude metastatic disease. 2. Subtle 6 mm low-attenuation lesion in the dome of the liver, not well seen previously. Lesion is too small to characterize. Further evaluation  could be performed with MR abdomen without and with contrast, as clinically indicated. 3. Hepatic steatosis. 4. Aortic atherosclerosis (ICD10-I70.0). Coronary artery calcification.     03/20/2020 Imaging   Whole body bone scan  IMPRESSION: 1. Increased activity noted over the right breast, possibly related to the patient's right breast cancer.   2. Punctate area of increased activity over the left maxillary region, most likely related to sinus disease or dental disease. Increased activity noted over the left mandible also possibly related to dental disease. No other focal bony abnormalities to suggest metastatic disease identified.   03/22/2020 Procedure   PAC placement by Dr [name]    03/22/2020 - 08/23/2020 Chemotherapy   AC q2weeks for 4 cycles 03/22/20-05/03/20 followed by weekly Taxol and Carboplatin q3weeks for 12 weeks starting 05/17/20.                  Week 2 Taxol reduced to half dose and CT was dose reduced starting with week 4 due to thrombocytopenia. Given pancytopenia, will change Carboplatin to 50% dose weekly with Taxol 22m/m2 starting with week 7 (06/13/20). Will hold Carboplatin as needed based on labs. Carboplatin increased to 80% starting with week 8. Carboplatin dose reduced for C11 and held with C12. C12 Taxol postponed to 08/23/20   03/22/2020 Pathology Results   FINAL MICROSCOPIC DIAGNOSIS:   A. BREAST, RIGHT, PUNCH BIOPSY:  - Carcinoma involving dermal lymphatics.  - See comment.   B. BREAST, LEFT, PUNCH BIOPSY:  - Carcinoma involving dermal lymphatics.  - See comment.   COMMENT:  The  morphology is compatible with ductal breast carcinoma.    ADDENDUM:   PROGNOSTIC INDICATOR RESULTS:   Immunohistochemical and morphometric analysis performed manually   The tumor cells are NEGATIVE for Her2 (0%).   Estrogen Receptor:       NEGATIVE, 0%  Progesterone Receptor:   NEGATIVE, 0%    08/13/2020 Breast MRI   IMPRESSION: 1. Interval significant response to  treatment of the diffuse non-mass enhancement involving the UPPER RIGHT breast. There is a solitary residual 5 mm indeterminate mass in the UPPER OUTER QUADRANT at MIDDLE depth with plateau kinetics. 2. Minimal response to treatment of the diffuse non-mass enhancement throughout the LEFT breast. 3. Resolution of enhancement of the skin and nipple-areolar complex of the RIGHT breast. 4. Near complete resolution of enhancement of the skin and nipple-areolar complex of the LEFT breast. Minimal residual nipple-areola complex enhancement persists. 5. The 3 previously identified pathologic RIGHT axillary lymph nodes are now normal in appearance. There is no pathologic lymphadenopathy currently.   08/23/2020 -  Chemotherapy   Immunotherapy Keytruda q3weeks starting 08/23/20 for 1 year treatment.    10/01/2020 Surgery   MASTECTOMIES WITH RIGHT SENTINEL LYMPH NODE BIOPSIES,RIGHT TARGETED RADIOACTIVE SEED LYMPH NODE, LEFT BREAST MODIFIED RADICAL MASTECTOMY  and RADIOACTIVE SEED GUIDED  TARGETED RIGHT SENTINEL LYMPH NODE BIOPSY by Dr [name]     10/01/2020 Pathology Results   FINAL MICROSCOPIC DIAGNOSIS:   A. BREAST, LEFT, MASTECTOMY:  - Invasive ductal carcinoma, 7 cm.  - Anterior and posterior soft tissue margins not involved by tumor.  - Foci of skin involvement by tumor, see comment.  - Nipple involvement by tumor.  - See oncology table and comment.   B. LYMPH NODE, LEFT AXILLARY, CONTENTS:  - One lymph node with no metastatic carcinoma (0/1).   C. BREAST, RIGHT, MASTECTOMY:  - Invasive ductal carcinoma, greater than 5 cm.  - Margins not involved.  - Subareolar breast tissue involved by tumor.  - See oncology table and comment.   D. LYMPH NODE, RIGHT #2, SENTINEL, EXCISION:  - Metastatic carcinoma in one lymph node, 0.5 cm (1/1).   E. LYMPH NODE, RIGHT #1, SENTINEL, EXCISION:  - Metastatic carcinoma in one lymph node, 1.5 cm (1/1).  - Biopsy site and biopsy clip.   F. LYMPH  NODE, RIGHT #3, SENTINEL, EXCISION:  - Metastatic carcinoma in one lymph node, 1.5 cm (1/1).   G. LYMPH NODE, RIGHT #4, SENTINEL, EXCISION:  - One lymph node with no metastatic carcinoma (0/1).   H. LYMPH NODE, RIGHT #5, SENTINEL, EXCISION:  - Metastatic carcinoma in one lymph node, 0.5 cm (1/1).   I. LYMPH NODE, RIGHT #6, SENTINEL, EXCISION:  - One lymph node with no metastatic carcinoma (0/1).    10/01/2020 Cancer Staging   Staging form: Breast, AJCC 8th Edition - Pathologic stage from 10/01/2020: Stage IIIC (pT4b, pN0, cM0, G3, ER-, PR-, HER2-) - Signed by Alla Feeling, NP on 10/24/2020   10/14/2020 Surgery   RIGHT AXILLARY LYMPH NODE DISSECTION by Dr [name]  -1 /13 positive LN for metastatic carcinoma    11/18/2020 -  Chemotherapy   Adjuvant Xeloda 1500 mg BID days 1-14 q21 days x6 months, starting 11/18/2020 (will reduce to 1500 mg Am/1000 mg Pm M-F during radiation)   12/10/2020 -  Radiation Therapy   Adjuvant Radiation to right chest wall and axilla with Dr Lisbeth Renshaw on 12/10/20      CURRENT THERAPY:  1.Keytruda q3weeks starting 08/23/20 for 1 year treatment. 2.Adjuvant Xeloda1500 mg BID days  1-14 q21 days x6 months, starting 11/18/2020(will reduce to 1500 mg Am/1000 mg Pm M-F during radiation) 3. Adjuvant Radiation to right chest wall and axilla with Dr Lisbeth Renshaw on 12/10/20  INTERVAL HISTORY:  Victoria Coffey is here for a follow up. She presents to the clinic alone. Tolerating chemo and RT well overall, mild to moderate fatigue, able to do all ADLs and routine house work  Dark skin in hand and feet, no skin peeling or crackling  She is still on PT twice a week, ROM of right shoulder is better  Eating well, weight stable No pain or other complains   All other systems were reviewed with the patient and are negative.  MEDICAL HISTORY:  Past Medical History:  Diagnosis Date  . Aortic atherosclerosis ([place])   . Breast cancer ([place]) dx'd 1993   left  . Fatty liver   .  History of left breast cancer   . HIV infection ([place])   . Hypertension   . Port-A-Cath in place     SURGICAL HISTORY: Past Surgical History:  Procedure Laterality Date  . AXILLARY LYMPH NODE DISSECTION Right 10/14/2020   Procedure: left axilla removal of two drains right axillary lymph node dissection;  Surgeon: [name], MD;  Location: [facility];  Service: General;  Laterality: Right;  PEC BLOCK, RNFA OR PA  . BREAST BIOPSY Bilateral 03/22/2020   Procedure: BILATERAL BREAST PUNCH BIOPSIES;  Surgeon: [name], MD;  Location: [place];  Service: General;  Laterality: Bilateral;  . BREAST LUMPECTOMY    . CESAREAN SECTION    . left breast lumpectomy  1993  . MASTECTOMY W/ SENTINEL NODE BIOPSY Bilateral 10/01/2020  . MASTECTOMY W/ SENTINEL NODE BIOPSY Bilateral 10/01/2020   Procedure: BILATERAL MASTECTOMIES WITH RIGHT SENTINEL LYMPH NODE BIOPSIES,RIGHT TARGETED RADIOACTIVE SEED LYMPH NODE, LEFT BREAST MODIFIED RADICAL MASTECTOMY ;  Surgeon: [name], MD;  Location: [place];  Service: General;  Laterality: Bilateral;  PEC BLOCK, RNFA  . PORTACATH PLACEMENT Right 03/22/2020   Procedure: INSERTION PORT-A-CATH WITH ULTRASOUND GUIDANCE;  Surgeon: [name], MD;  Location: [place];  Service: General;  Laterality: Right;  . RADIOACTIVE SEED GUIDED AXILLARY SENTINEL LYMPH NODE Right 10/01/2020   Procedure: RADIOACTIVE SEED GUIDED  TARGETED RIGHT SENTINEL LYMPH NODE BIOPSY;  Surgeon: [name], MD;  Location: [place];  Service: General;  Laterality: Right;  PEC BLOCK, RNFA    I have reviewed the social history and family history with the patient and they are unchanged from previous note.  ALLERGIES:  is allergic to codeine, other, grapefruit bioflavonoid complex, pomegranate [punica], and shellfish allergy.  MEDICATIONS:  Current Outpatient Medications  Medication Sig Dispense Refill  . acetaminophen (TYLENOL) 500 MG tablet Take 1,000 mg by mouth every 6 (six) hours as needed for moderate  pain or headache.     . capecitabine (XELODA) 500 MG tablet Take 3 tabs in morning and 2 tabs in evening, on days of radiation, Monday through Friday. 75 tablet 1  . emtricitabine-rilpivir-tenofovir AF (ODEFSEY) 200-25-25 MG TABS tablet Take 1 tablet by mouth daily with breakfast. 30 tablet 11  . hydrochlorothiazide (HYDRODIURIL) 25 MG tablet TAKE 1 TABLET(25 MG) BY MOUTH DAILY 30 tablet 5  . lidocaine-prilocaine (EMLA) cream Apply 1 application topically See admin instructions. Apply 1-2 hours prior to access    . loratadine (CLARITIN) 10 MG tablet Take 10 mg by mouth See admin instructions. Takes 3-4 days before and after treatment    . potassium chloride SA (KLOR-CON) 20 MEQ  tablet Take 1 tablet (20 mEq total) by mouth 2 (two) times daily. 60 tablet 1  . traMADol (ULTRAM) 50 MG tablet Take 1 tablet (50 mg total) by mouth every 6 (six) hours as needed (mild pain). 30 tablet 1   No current facility-administered medications for this visit.    PHYSICAL EXAMINATION: ECOG PERFORMANCE STATUS: 1 - Symptomatic but completely ambulatory  Vitals:   12/26/20 0840  BP: (!) 132/97  Pulse: 91  Resp: 17  Temp: (!) 97 F (36.1 C)  SpO2: 100%   Filed Weights   12/26/20 0840  Weight: 130 lb (59 kg)    GENERAL:alert, no distress and comfortable SKIN: skin color, texture, turgor are normal, no rashes or significant lesions EYES: normal, Conjunctiva are pink and non-injected, sclera clear NECK: supple, thyroid normal size, non-tender, without nodularity LYMPH:  no palpable lymphadenopathy in the cervical, axillary  LUNGS: clear to auscultation and percussion with normal breathing effort HEART: regular rate & rhythm and no murmurs and no lower extremity edema ABDOMEN:abdomen soft, non-tender and normal bowel sounds Musculoskeletal:no cyanosis of digits and no clubbing  NEURO: alert & oriented x 3 with fluent speech, no focal motor/sensory deficits Breasts: Breast inspection showed s/p bilateral  mastectomy, surgical incisions have healed very well with minimal scar tissue, no palpable nodules on chest wall or axilla, (+) diffuse skin hyperpigmentation on bilateral chest wall, no ulcers or skin peeling.    LABORATORY DATA:  I have reviewed the data as listed CBC Latest Ref Rng & Units 12/26/2020 12/16/2020 12/05/2020  WBC 4.0 - 10.5 K/uL 3.2(L) 3.3(L) 3.7(L)  Hemoglobin 12.0 - 15.0 g/dL 11.2(L) 11.4(L) 10.6(L)  Hematocrit 36.0 - 46.0 % 32.3(L) 33.6(L) 31.5(L)  Platelets 150 - 400 K/uL 201 211 233     CMP Latest Ref Rng & Units 12/16/2020 12/05/2020 11/14/2020  Glucose 70 - 99 mg/dL 114(H) 100(H) 100(H)  BUN 8 - 23 mg/dL $Remove'21 20 13  'BcxQSrF$ Creatinine 0.44 - 1.00 mg/dL 1.05(H) 0.90 0.93  Sodium 135 - 145 mmol/L 137 139 137  Potassium 3.5 - 5.1 mmol/L 3.5 3.1(L) 3.3(L)  Chloride 98 - 111 mmol/L 103 104 105  CO2 22 - 32 mmol/L $RemoveB'23 26 23  'auECQdhg$ Calcium 8.9 - 10.3 mg/dL 9.3 9.1 9.3  Total Protein 6.5 - 8.1 g/dL 7.8 7.3 7.5  Total Bilirubin 0.3 - 1.2 mg/dL 0.3 0.3 0.2(L)  Alkaline Phos 38 - 126 U/L 518(H) 444(H) 347(H)  AST 15 - 41 U/L 72(H) 54(H) 53(H)  ALT 0 - 44 U/L 71(H) 61(H) 50(H)      RADIOGRAPHIC STUDIES: I have personally reviewed the radiological images as listed and agreed with the findings in the report. No results found.   ASSESSMENT & PLAN:  Victoria Coffey is a 65 y.o. 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 -Further work up showed 9cm nodular area in right breast and skin biopsy confirmed cancer involvement -Staging scans were negative for distant metastasis -She completed AC X4 and weekly TC 03/22/20-08/23/20.  -Given  recentpublish [employer] 522 trial data,I started her on Immunotherapy Keytruda q3weeks for 1 year of treatment beginning 08/23/20. -She underwent b/l mastectomy with Dr [name] on 10/01/20.Shealsounderwent right axillary lymph node dissection on 10/14/2020. -Due tohersignificantresidual diseasein LNs,Istarted her onadjuvant Xeloda1500 mg twice daily, 2 weeks on  and one week off($RemoveBefo'1500mg'WinhZWeUZAh$  am, $Re'1000mg'wpu$  pm M-F when she is on radiation) for6 monthson 1/17/22to reduce her high risk of recurrence after surgery. -She started adjuvant radiation to right chest wall and axilla on 12/10/20 with Dr Lisbeth Renshaw. Tolerating well so far with mild fatigue -She is tolerating Xeloda well -Lab reviewed, mild leukopenia and anemia, CMP still pending, overall adequate to continue chemotherapy -Her tumor marker CA 27.29 has been rising significantly in the past months, concerning for cancer recurrence I discussed with patient, plan to repeat PET scan after she completes chemoradiation.  She is clinically doing well, exam was negative, no high clinical suspicion for recurrence. -f/u in 2 weeks  . 2. Cytopenias -secondary to chemo -Mild and stable, continue monitoring labs weekly  3. H/o Left breast cancer, Dx in 1993s/p lumpectomy, radiation, chemo, Tamoxifen.Genetic Testinghas not been done yet.  4. Comorbidities: HIV(+)Dx 1993, HTN -Her HIV has been undetectable and well controlled. She is currently being seen by ID Dr Graylon Good -I referred her for Evusheld injection for COVID prevention  5.Mild transaminitis, secondary to chemo  -We will monitor   PLAN: -lab reviewed  -Continue CCRT with Xeloda $RemoveBef'1500mg'CCmpqeFrlP$  am and $Remo'1000mg'TXaDS$  pm Mondays through Fridays  -Continue Keytruda every 3 weeks, due today  -Lab, flush, F/u on 3/7   No problem-specific Assessment & Plan notes found for this encounter.   No orders of the defined types were placed in this encounter.  All questions were answered. The patient  knows to call the clinic with any problems, questions or concerns. No barriers to learning was detected. The total time spent in the appointment was 30 minutes.     [name], MD 12/26/2020   I, Joslyn Devon, am acting as scribe for [name], MD.   I have reviewed the above documentation for accuracy and completeness, and I agree with the above.

## 2020-12-26 ENCOUNTER — Ambulatory Visit: Payer: BC Managed Care – PPO

## 2020-12-26 ENCOUNTER — Inpatient Hospital Stay: Payer: BC Managed Care – PPO

## 2020-12-26 ENCOUNTER — Encounter: Payer: Self-pay | Admitting: Hematology

## 2020-12-26 ENCOUNTER — Other Ambulatory Visit: Payer: Self-pay

## 2020-12-26 ENCOUNTER — Ambulatory Visit
Admission: RE | Admit: 2020-12-26 | Discharge: 2020-12-26 | Disposition: A | Discharge status: Home / Self Care | Payer: BC Managed Care – PPO | Source: Ambulatory Visit | Attending: Radiation Oncology | Admitting: Radiation Oncology

## 2020-12-26 ENCOUNTER — Inpatient Hospital Stay (HOSPITAL_BASED_OUTPATIENT_CLINIC_OR_DEPARTMENT_OTHER): Payer: BC Managed Care – PPO | Admitting: Hematology

## 2020-12-26 DIAGNOSIS — C50412 Malignant neoplasm of upper-outer quadrant of left female breast: Secondary | ICD-10-CM

## 2020-12-26 DIAGNOSIS — Z95828 Presence of other vascular implants and grafts: Secondary | ICD-10-CM

## 2020-12-26 DIAGNOSIS — C50411 Malignant neoplasm of upper-outer quadrant of right female breast: Secondary | ICD-10-CM

## 2020-12-26 DIAGNOSIS — Z170 Estrogen receptor positive status [ER+]: Secondary | ICD-10-CM

## 2020-12-26 DIAGNOSIS — Z5112 Encounter for antineoplastic immunotherapy: Secondary | ICD-10-CM | POA: Diagnosis not present

## 2020-12-26 LAB — CBC WITH DIFFERENTIAL (CANCER CENTER ONLY)
Abs Immature Granulocytes: 0.01 10*3/uL (ref 0.00–0.07)
Basophils Absolute: 0 10*3/uL (ref 0.0–0.1)
Basophils Relative: 0 %
Eosinophils Absolute: 0 10*3/uL (ref 0.0–0.5)
Eosinophils Relative: 1 %
HCT: 32.3 % — ABNORMAL LOW (ref 36.0–46.0)
Hemoglobin: 11.2 g/dL — ABNORMAL LOW (ref 12.0–15.0)
Immature Granulocytes: 0 %
Lymphocytes Relative: 22 %
Lymphs Abs: 0.7 10*3/uL (ref 0.7–4.0)
MCH: 30.6 pg (ref 26.0–34.0)
MCHC: 34.7 g/dL (ref 30.0–36.0)
MCV: 88.3 fL (ref 80.0–100.0)
Monocytes Absolute: 0.4 10*3/uL (ref 0.1–1.0)
Monocytes Relative: 11 %
Neutro Abs: 2.1 10*3/uL (ref 1.7–7.7)
Neutrophils Relative %: 66 %
Platelet Count: 201 10*3/uL (ref 150–400)
RBC: 3.66 MIL/uL — ABNORMAL LOW (ref 3.87–5.11)
RDW: 16.8 % — ABNORMAL HIGH (ref 11.5–15.5)
WBC Count: 3.2 10*3/uL — ABNORMAL LOW (ref 4.0–10.5)
nRBC: 0 % (ref 0.0–0.2)

## 2020-12-26 LAB — CMP (CANCER CENTER ONLY)
ALT: 64 U/L — ABNORMAL HIGH (ref 0–44)
AST: 70 U/L — ABNORMAL HIGH (ref 15–41)
Albumin: 3.5 g/dL (ref 3.5–5.0)
Alkaline Phosphatase: 557 U/L — ABNORMAL HIGH (ref 38–126)
Anion gap: 11 (ref 5–15)
BUN: 16 mg/dL (ref 8–23)
CO2: 24 mmol/L (ref 22–32)
Calcium: 8.9 mg/dL (ref 8.9–10.3)
Chloride: 103 mmol/L (ref 98–111)
Creatinine: 0.98 mg/dL (ref 0.44–1.00)
GFR, Estimated: >60 mL/min (ref >60)
Glucose, Bld: 100 mg/dL — ABNORMAL HIGH (ref 70–99)
Potassium: 3.8 mmol/L (ref 3.5–5.1)
Sodium: 138 mmol/L (ref 135–145)
Total Bilirubin: 0.4 mg/dL (ref 0.3–1.2)
Total Protein: 7.6 g/dL (ref 6.5–8.1)

## 2020-12-26 MED ORDER — SODIUM CHLORIDE 0.9 % IV SOLN
Freq: Once | INTRAVENOUS | Status: AC
  Filled 2020-12-26: qty 250

## 2020-12-26 MED ORDER — SODIUM CHLORIDE 0.9% FLUSH
10.0000 mL | Freq: Once | INTRAVENOUS | Status: AC
  Administered 2020-12-26: 10 mL
  Filled 2020-12-26: qty 10

## 2020-12-26 MED ORDER — SODIUM CHLORIDE 0.9% FLUSH
10.0000 mL | INTRAVENOUS | Status: DC | PRN
Start: 2020-12-26 — End: 2020-12-26
  Administered 2020-12-26: 10 mL
  Filled 2020-12-26: qty 10

## 2020-12-26 MED ORDER — SODIUM CHLORIDE 0.9 % IV SOLN
200.0000 mg | Freq: Once | INTRAVENOUS | Status: AC
  Administered 2020-12-26: 200 mg via INTRAVENOUS
  Filled 2020-12-26: qty 8

## 2020-12-26 MED ORDER — HEPARIN SOD (PORK) LOCK FLUSH 100 UNIT/ML IV SOLN
500.0000 [IU] | Freq: Once | INTRAVENOUS | Status: AC | PRN
  Administered 2020-12-26: 500 [IU]
  Filled 2020-12-26: qty 5

## 2020-12-26 NOTE — Patient Instructions (Signed)
[facility] Discharge Instructions for Patients Receiving Chemotherapy  Today you received the following chemotherapy agents Pembrolizumab (Keytruda).  To help prevent nausea and vomiting after your treatment, we encourage you to take your nausea medication as prescribed.   If you develop nausea and vomiting that is not controlled by your nausea medication, call the clinic.   BELOW ARE SYMPTOMS THAT SHOULD BE REPORTED IMMEDIATELY:  *FEVER GREATER THAN 100.5 F  *CHILLS WITH OR WITHOUT FEVER  NAUSEA AND VOMITING THAT IS NOT CONTROLLED WITH YOUR NAUSEA MEDICATION  *UNUSUAL SHORTNESS OF BREATH  *UNUSUAL BRUISING OR BLEEDING  TENDERNESS IN MOUTH AND THROAT WITH OR WITHOUT PRESENCE OF ULCERS  *URINARY PROBLEMS  *BOWEL PROBLEMS  UNUSUAL RASH Items with * indicate a potential emergency and should be followed up as soon as possible.  Feel free to call the clinic should you have any questions or concerns. The clinic phone number is [phone].  Please show the CHEMO ALERT CARD at check-in to the Emergency Department and triage nurse.   

## 2020-12-27 ENCOUNTER — Ambulatory Visit
Admission: RE | Admit: 2020-12-27 | Discharge: 2020-12-27 | Disposition: A | Discharge status: Home / Self Care | Payer: BC Managed Care – PPO | Source: Ambulatory Visit | Attending: Radiation Oncology | Admitting: Radiation Oncology

## 2020-12-27 ENCOUNTER — Telehealth: Payer: Self-pay | Admitting: Hematology

## 2020-12-27 ENCOUNTER — Other Ambulatory Visit: Payer: Self-pay

## 2020-12-27 ENCOUNTER — Ambulatory Visit: Payer: BC Managed Care – PPO

## 2020-12-27 DIAGNOSIS — C50412 Malignant neoplasm of upper-outer quadrant of left female breast: Secondary | ICD-10-CM | POA: Diagnosis not present

## 2020-12-27 NOTE — Telephone Encounter (Signed)
Checked out appointment. No LOS notes needing to be scheduled. No changes made. 

## 2020-12-30 ENCOUNTER — Other Ambulatory Visit: Payer: Self-pay

## 2020-12-30 ENCOUNTER — Inpatient Hospital Stay: Payer: BC Managed Care – PPO

## 2020-12-30 ENCOUNTER — Ambulatory Visit
Admission: RE | Admit: 2020-12-30 | Discharge: 2020-12-30 | Disposition: A | Discharge status: Home / Self Care | Payer: BC Managed Care – PPO | Source: Ambulatory Visit | Attending: Radiation Oncology | Admitting: Radiation Oncology

## 2020-12-30 ENCOUNTER — Ambulatory Visit: Payer: BC Managed Care – PPO

## 2020-12-30 DIAGNOSIS — Z170 Estrogen receptor positive status [ER+]: Secondary | ICD-10-CM

## 2020-12-30 DIAGNOSIS — Z95828 Presence of other vascular implants and grafts: Secondary | ICD-10-CM

## 2020-12-30 DIAGNOSIS — Z5112 Encounter for antineoplastic immunotherapy: Secondary | ICD-10-CM | POA: Diagnosis not present

## 2020-12-30 DIAGNOSIS — C50412 Malignant neoplasm of upper-outer quadrant of left female breast: Secondary | ICD-10-CM | POA: Diagnosis not present

## 2020-12-30 LAB — CMP (CANCER CENTER ONLY)
ALT: 56 U/L — ABNORMAL HIGH (ref 0–44)
AST: 74 U/L — ABNORMAL HIGH (ref 15–41)
Albumin: 3.5 g/dL (ref 3.5–5.0)
Alkaline Phosphatase: 506 U/L — ABNORMAL HIGH (ref 38–126)
Anion gap: 10 (ref 5–15)
BUN: 17 mg/dL (ref 8–23)
CO2: 20 mmol/L — ABNORMAL LOW (ref 22–32)
Calcium: 8.9 mg/dL (ref 8.9–10.3)
Chloride: 102 mmol/L (ref 98–111)
Creatinine: 0.97 mg/dL (ref 0.44–1.00)
GFR, Estimated: >60 mL/min (ref >60)
Glucose, Bld: 104 mg/dL — ABNORMAL HIGH (ref 70–99)
Potassium: 4.2 mmol/L (ref 3.5–5.1)
Sodium: 132 mmol/L — ABNORMAL LOW (ref 135–145)
Total Bilirubin: 0.4 mg/dL (ref 0.3–1.2)
Total Protein: 7.7 g/dL (ref 6.5–8.1)

## 2020-12-30 LAB — CBC WITH DIFFERENTIAL (CANCER CENTER ONLY)
Abs Immature Granulocytes: 0.02 10*3/uL (ref 0.00–0.07)
Basophils Absolute: 0 10*3/uL (ref 0.0–0.1)
Basophils Relative: 0 %
Eosinophils Absolute: 0 10*3/uL (ref 0.0–0.5)
Eosinophils Relative: 0 %
HCT: 32.6 % — ABNORMAL LOW (ref 36.0–46.0)
Hemoglobin: 11.2 g/dL — ABNORMAL LOW (ref 12.0–15.0)
Immature Granulocytes: 1 %
Lymphocytes Relative: 13 %
Lymphs Abs: 0.6 10*3/uL — ABNORMAL LOW (ref 0.7–4.0)
MCH: 30.6 pg (ref 26.0–34.0)
MCHC: 34.4 g/dL (ref 30.0–36.0)
MCV: 89.1 fL (ref 80.0–100.0)
Monocytes Absolute: 0.5 10*3/uL (ref 0.1–1.0)
Monocytes Relative: 10 %
Neutro Abs: 3.3 10*3/uL (ref 1.7–7.7)
Neutrophils Relative %: 76 %
Platelet Count: 218 10*3/uL (ref 150–400)
RBC: 3.66 MIL/uL — ABNORMAL LOW (ref 3.87–5.11)
RDW: 17.2 % — ABNORMAL HIGH (ref 11.5–15.5)
WBC Count: 4.4 10*3/uL (ref 4.0–10.5)
nRBC: 0 % (ref 0.0–0.2)

## 2020-12-30 MED ORDER — HEPARIN SOD (PORK) LOCK FLUSH 100 UNIT/ML IV SOLN
500.0000 [IU] | Freq: Once | INTRAVENOUS | Status: AC
  Administered 2020-12-30: 500 [IU]
  Filled 2020-12-30: qty 5

## 2020-12-30 MED ORDER — SODIUM CHLORIDE 0.9% FLUSH
10.0000 mL | Freq: Once | INTRAVENOUS | Status: AC
  Administered 2020-12-30: 10 mL
  Filled 2020-12-30: qty 10

## 2020-12-31 ENCOUNTER — Ambulatory Visit: Payer: BC Managed Care – PPO

## 2020-12-31 ENCOUNTER — Ambulatory Visit: Payer: BC Managed Care – PPO | Attending: Hematology

## 2020-12-31 ENCOUNTER — Ambulatory Visit
Admission: RE | Admit: 2020-12-31 | Discharge: 2020-12-31 | Disposition: A | Discharge status: Home / Self Care | Payer: BC Managed Care – PPO | Source: Ambulatory Visit | Attending: Radiation Oncology | Admitting: Radiation Oncology

## 2020-12-31 DIAGNOSIS — R293 Abnormal posture: Secondary | ICD-10-CM | POA: Diagnosis present

## 2020-12-31 DIAGNOSIS — Z170 Estrogen receptor positive status [ER+]: Secondary | ICD-10-CM | POA: Diagnosis present

## 2020-12-31 DIAGNOSIS — R600 Localized edema: Secondary | ICD-10-CM | POA: Diagnosis present

## 2020-12-31 DIAGNOSIS — C50912 Malignant neoplasm of unspecified site of left female breast: Secondary | ICD-10-CM | POA: Diagnosis present

## 2020-12-31 DIAGNOSIS — C50911 Malignant neoplasm of unspecified site of right female breast: Secondary | ICD-10-CM | POA: Diagnosis present

## 2020-12-31 DIAGNOSIS — C50412 Malignant neoplasm of upper-outer quadrant of left female breast: Secondary | ICD-10-CM | POA: Insufficient documentation

## 2020-12-31 NOTE — Therapy (Signed)
[place], [place], [address] Phone: [phone]   Fax:  [phone]  Physical Therapy Treatment  Patient Details  Name: Victoria Coffey MRN: [id number] Date of Birth: 1956/07/12 Referring Provider (PT): Dr Burr Medico   Encounter Date: 12/31/2020   PT End of Session - 12/31/20 0902    Visit Number 5    Number of Visits 13    Date for PT Re-Evaluation 01/16/21    PT Start Time 0853    PT Stop Time 0951    PT Time Calculation (min) 58 min    Activity Tolerance Patient tolerated treatment well    Behavior During Therapy [facility] for tasks assessed/performed           Past Medical History:  Diagnosis Date  . Aortic atherosclerosis ([place])   . Breast cancer ([place]) dx'd 1993   left  . Fatty liver   . History of left breast cancer   . HIV infection ([place])   . Hypertension   . Port-A-Cath in place     Past Surgical History:  Procedure Laterality Date  . AXILLARY LYMPH NODE DISSECTION Right 10/14/2020   Procedure: left axilla removal of two drains right axillary lymph node dissection;  Surgeon: [name], MD;  [facility];  Service: General;  Laterality: Right;  PEC BLOCK, RNFA OR PA  . BREAST BIOPSY Bilateral 03/22/2020   Procedure: BILATERAL BREAST PUNCH BIOPSIES;  Surgeon: [name], MD;  Location: [place];  Service: General;  Laterality: Bilateral;  . BREAST LUMPECTOMY    . CESAREAN SECTION    . left breast lumpectomy  1993  . MASTECTOMY W/ SENTINEL NODE BIOPSY Bilateral 10/01/2020  . MASTECTOMY W/ SENTINEL NODE BIOPSY Bilateral 10/01/2020   Procedure: BILATERAL MASTECTOMIES WITH RIGHT SENTINEL LYMPH NODE BIOPSIES,RIGHT TARGETED RADIOACTIVE SEED LYMPH NODE, LEFT BREAST MODIFIED RADICAL MASTECTOMY ;  Surgeon: [name], MD;  Location: [place];  Service: General;  Laterality: Bilateral;  PEC BLOCK, RNFA  . PORTACATH PLACEMENT Right 03/22/2020   Procedure: INSERTION PORT-A-CATH WITH ULTRASOUND  GUIDANCE;  Surgeon: [name], MD;  Location: [place];  Service: General;  Laterality: Right;  . RADIOACTIVE SEED GUIDED AXILLARY SENTINEL LYMPH NODE Right 10/01/2020   Procedure: RADIOACTIVE SEED GUIDED  TARGETED RIGHT SENTINEL LYMPH NODE BIOPSY;  Surgeon: [name], MD;  Location: [place];  Service: General;  Laterality: Right;  PEC BLOCK, RNFA    There were no vitals filed for this visit.   Subjective Assessment - 12/31/20 0849    Subjective Skin is holding up [facility] with radiation.  Had radiation already today. Have a little tenderness but not too bad. Shoulders feel great today, but feel some tightness across her chest. sleeve for left UE is not yet in.    Pertinent History Patient was diagnosed on 02/28/2020 with left invasive ductal carcinoma breast cancer. The mass measured 3.9 cm and was located in the upper outer quadrant. It was ER positive, PR negative, and HER2 negative with a Ki67 of 40%. Her left breast appeared to be retracted with significant skin changes. She also had 3 abnormal appearing nodes in the RIGHT axilla and 1 was biopsied and found to be positive for carcinoma. She has a history of left breast cancer in 1993 when she underwent a left lumpectomy, ALND, chemotherapy and radiation. For most recent diagnosis She had neoadjuvant chemotherapy 03/22/20-08/23/20.Pt is now s/p Left breast modified radical mastectomy and right mastectomy with 3+ LN of 6 removed on  10/01/20.  On 10/14/20 she underwent a right ALND. She is presently undergoing radiation.    Patient Stated Goals reduce lymphedema risk, and learn shoulder ROM and strengthening exs and safe way to progress, Decrease swelling    Currently in Pain? No/denies    Pain Score 0-No pain    Multiple Pain Sites No                             [facility] Adult PT Treatment/Exercise - 12/31/20 0001      Shoulder Exercises: Supine   Other Supine Exercises supine wand flex and scaption x 5    Other Supine Exercises  Supine flex, scaption, Horizontal abd x 5 ea      Manual Therapy   Manual Therapy Manual Lymphatic Drainage (MLD);Passive ROM;Myofascial release    Myofascial Release Bilateral axilla during PROM and to bilateral chest regions    Manual Lymphatic Drainage (MLD) In Supine: Short neck, 5 diaphragmatic breaths, Rt inguinal nodes, Rt axillo-inguinal anastomosis, then right lateral trunk in SL, and supine Rt Ue working from proximal to distal then retracing all steps. Began instructing pt in this while performing then had her return demo.    Passive ROM In Supine to bilateral shoulder into flexion, abduction, ER and D2 to pts tolerance                       PT Long Term Goals - 12/05/20 1835      PT LONG TERM GOAL #1   Title Patient will demonstrate she has regained full shoulder ROM and function post operatively compared to baselines.    Time 6    Period Weeks    Status New    Target Date 01/16/21      PT LONG TERM GOAL #2   Title pt will be independent in self MLD  to right UE and trunk    Time 4    Period Weeks    Status New    Target Date 01/02/21      PT LONG TERM GOAL #3   Title Pt will have minimal difference in circumference of both arms    Time 6    Period Weeks    Status New    Target Date 01/16/21      PT LONG TERM GOAL #4   Title Pt will understand precautions for skin care and decreasing risk of infection/lymphedema    Time 6    Period Weeks    Status New    Target Date 01/16/21      PT LONG TERM GOAL #5   Title Pt will be independent in HEP for shoulder ROM and strengthening    Time 6                 Plan - 12/31/20 0947    Clinical Impression Statement Therapy consisted of MFR techniques, PROM bilateral shoulders, MLD by therapist with instruction to pt and pt. practicing as well. Pt was greatly improved today with MLD and required very few cues for proper form.  She di exceptionally well with supine AROM exs with noted improvement in ROM.   She feels her swelling under the arm has decreased considerably and she is much more aware of her arm resting comfortably at her side.    Personal Factors and Comorbidities Comorbidity 1;Comorbidity 3+    Comorbidities Breast CA reoccurence, Aids, aortic atherosclerosis    Stability/Clinical Decision Making Stable/Uncomplicated  Rehab Potential Good    PT Frequency 2x / week    PT Duration 6 weeks    PT Treatment/Interventions ADLs/Self Care Home Management;Therapeutic activities;Therapeutic exercise;Neuromuscular re-education;Manual techniques;Orthotic Fit/Training;Patient/family education;Manual lymph drainage;Compression bandaging;Scar mobilization;Passive range of motion    PT Next Visit Plan Cont and review self MLD to Rt UE (no anterior inter-axillary anastomosis) having pt return demo; cont bilateral shoulder P/ROM/MFR to chest,axilla, Supine flex/scaption /Horizontal abd AROM  progressing to band when ready    PT Home Exercise Plan Post op shoulder ROM HEP, supine flex/scaption with wand, self MLD    Consulted and Agree with Plan of Care Patient           Patient will benefit from skilled therapeutic intervention in order to improve the following deficits and impairments:  Decreased knowledge of precautions,Decreased range of motion,Decreased skin integrity,Decreased scar mobility,Decreased strength,Increased edema,Increased fascial restricitons,Postural dysfunction  Visit Diagnosis: Inflammatory breast cancer, left (HCC)  Localized edema  Abnormal posture  Inflammatory breast cancer, right (HCC)  Malignant neoplasm of upper-outer quadrant of left breast in female, estrogen receptor positive ([place])     Problem List Patient Active Problem List   Diagnosis Date Noted  . Primary malignant neoplasm of upper outer quadrant of right breast ([place]) 10/01/2020  . Drug-induced neutropenia ([place]) 06/28/2020  . Thrombocytopenia ([place]) 06/07/2020  . Port-A-Cath in place 04/19/2020  .  Malignant neoplasm of upper-outer quadrant of left breast in female, estrogen receptor negative ([place]) 03/05/2020  . Rash and nonspecific skin eruption 11/08/2013  . History of breast cancer 12/20/2012  . HYPERTENSION, BENIGN ESSENTIAL 08/11/2007  . HIV DISEASE 04/14/2007    Claris Pong 12/31/2020, 9:59 AM  [employer] [place], [place], [address] Phone: [phone]   Fax:  [phone]  Name: Victoria Coffey MRN: [id number] Date of Birth: 19-Mar-1956 Cheral Almas, PT 12/31/20 10:01 AM

## 2021-01-01 ENCOUNTER — Other Ambulatory Visit: Payer: Self-pay

## 2021-01-01 ENCOUNTER — Ambulatory Visit
Admission: RE | Admit: 2021-01-01 | Discharge: 2021-01-01 | Disposition: A | Discharge status: Home / Self Care | Payer: BC Managed Care – PPO | Source: Ambulatory Visit | Attending: Radiation Oncology | Admitting: Radiation Oncology

## 2021-01-01 ENCOUNTER — Ambulatory Visit: Payer: BC Managed Care – PPO

## 2021-01-01 DIAGNOSIS — C50412 Malignant neoplasm of upper-outer quadrant of left female breast: Secondary | ICD-10-CM | POA: Diagnosis not present

## 2021-01-02 ENCOUNTER — Ambulatory Visit
Admission: RE | Admit: 2021-01-02 | Discharge: 2021-01-02 | Disposition: A | Discharge status: Home / Self Care | Payer: BC Managed Care – PPO | Source: Ambulatory Visit | Attending: Radiation Oncology | Admitting: Radiation Oncology

## 2021-01-02 ENCOUNTER — Ambulatory Visit: Payer: BC Managed Care – PPO

## 2021-01-02 ENCOUNTER — Other Ambulatory Visit: Payer: Self-pay

## 2021-01-02 DIAGNOSIS — C50412 Malignant neoplasm of upper-outer quadrant of left female breast: Secondary | ICD-10-CM | POA: Diagnosis not present

## 2021-01-02 DIAGNOSIS — R293 Abnormal posture: Secondary | ICD-10-CM

## 2021-01-02 DIAGNOSIS — C50912 Malignant neoplasm of unspecified site of left female breast: Secondary | ICD-10-CM | POA: Diagnosis not present

## 2021-01-02 DIAGNOSIS — C50911 Malignant neoplasm of unspecified site of right female breast: Secondary | ICD-10-CM

## 2021-01-02 DIAGNOSIS — Z170 Estrogen receptor positive status [ER+]: Secondary | ICD-10-CM

## 2021-01-02 DIAGNOSIS — R600 Localized edema: Secondary | ICD-10-CM

## 2021-01-02 NOTE — Therapy (Signed)
[place], [place], [address] Phone: [phone]   Fax:  [phone]  Physical Therapy Treatment  Patient Details  Name: Victoria Coffey MRN: [id number] Date of Birth: 1956-08-15 Referring Provider (PT): Dr Burr Medico   Encounter Date: 01/02/2021   PT End of Session - 01/02/21 0915    Visit Number 6    Number of Visits 13    Date for PT Re-Evaluation 01/16/21    PT Start Time 0903    PT Stop Time 0956    PT Time Calculation (min) 53 min    Activity Tolerance Patient tolerated treatment well    Behavior During Therapy [facility] for tasks assessed/performed           Past Medical History:  Diagnosis Date  . Aortic atherosclerosis ([place])   . Breast cancer ([place]) dx'd 1993   left  . Fatty liver   . History of left breast cancer   . HIV infection ([place])   . Hypertension   . Port-A-Cath in place     Past Surgical History:  Procedure Laterality Date  . AXILLARY LYMPH NODE DISSECTION Right 10/14/2020   Procedure: left axilla removal of two drains right axillary lymph node dissection;  Surgeon: [name], MD;  [facility];  Service: General;  Laterality: Right;  PEC BLOCK, RNFA OR PA  . BREAST BIOPSY Bilateral 03/22/2020   Procedure: BILATERAL BREAST PUNCH BIOPSIES;  Surgeon: [name], MD;  Location: [place];  Service: General;  Laterality: Bilateral;  . BREAST LUMPECTOMY    . CESAREAN SECTION    . left breast lumpectomy  1993  . MASTECTOMY W/ SENTINEL NODE BIOPSY Bilateral 10/01/2020  . MASTECTOMY W/ SENTINEL NODE BIOPSY Bilateral 10/01/2020   Procedure: BILATERAL MASTECTOMIES WITH RIGHT SENTINEL LYMPH NODE BIOPSIES,RIGHT TARGETED RADIOACTIVE SEED LYMPH NODE, LEFT BREAST MODIFIED RADICAL MASTECTOMY ;  Surgeon: [name], MD;  Location: [place];  Service: General;  Laterality: Bilateral;  PEC BLOCK, RNFA  . PORTACATH PLACEMENT Right 03/22/2020   Procedure: INSERTION PORT-A-CATH WITH ULTRASOUND  GUIDANCE;  Surgeon: [name], MD;  Location: [place];  Service: General;  Laterality: Right;  . RADIOACTIVE SEED GUIDED AXILLARY SENTINEL LYMPH NODE Right 10/01/2020   Procedure: RADIOACTIVE SEED GUIDED  TARGETED RIGHT SENTINEL LYMPH NODE BIOPSY;  Surgeon: [name], MD;  Location: [place];  Service: General;  Laterality: Right;  PEC BLOCK, RNFA    There were no vitals filed for this visit.   Subjective Assessment - 01/02/21 0904    Subjective Had radiation this morning.  No breaking of the skin but still have a little shadow. Shoulders are feeling pretty good, but still tight in chest.  Reaching overhead and the deep breathing help with chest tightness.  More discomfort than pain in the chest.  Swelling is doing well and the annoying rubbing of her arm on her side is not there.  Sleeve has not come in yet.  She has been doing MLD at home.    Pertinent History Patient was diagnosed on 02/28/2020 with left invasive ductal carcinoma breast cancer. The mass measured 3.9 cm and was located in the upper outer quadrant. It was ER positive, PR negative, and HER2 negative with a Ki67 of 40%. Her left breast appeared to be retracted with significant skin changes. She also had 3 abnormal appearing nodes in the RIGHT axilla and 1 was biopsied and found to be positive for carcinoma. She has a history of left breast cancer in 1993  when she underwent a left lumpectomy, ALND, chemotherapy and radiation. For most recent diagnosis She had neoadjuvant chemotherapy 03/22/20-08/23/20.Pt is now s/p Left breast modified radical mastectomy and right mastectomy with 3+ LN of 6 removed on  10/01/20.  On 10/14/20 she underwent a right ALND. She is presently undergoing radiation.    Patient Stated Goals reduce lymphedema risk, and learn shoulder ROM and strengthening exs and safe way to progress, Decrease swelling    Currently in Pain? No/denies    Pain Score 0-No pain                             [facility]  Adult PT Treatment/Exercise - 01/02/21 0001      Shoulder Exercises: Supine   Other Supine Exercises supine wand flex and scaption x 5    Other Supine Exercises      Manual Therapy   Manual Therapy Manual Lymphatic Drainage (MLD);Passive ROM;Myofascial release    Myofascial Release Bilateral axilla during PROM and to bilateral chest regions    Manual Lymphatic Drainage (MLD) In Supine: Short neck, 5 diaphragmatic breaths, Rt inguinal nodes, Rt axillo-inguinal anastomosis, then right lateral trunk in SL, and supine Rt Ue working from proximal to distal then retracing all steps. Began instructing pt in this while performing then had her return demo.    Passive ROM In Supine to bilateral shoulder into flexion, abduction, ER and D2 to pts tolerance                       PT Long Term Goals - 12/05/20 1835      PT LONG TERM GOAL #1   Title Patient will demonstrate she has regained full shoulder ROM and function post operatively compared to baselines.    Time 6    Period Weeks    Status New    Target Date 01/16/21      PT LONG TERM GOAL #2   Title pt will be independent in self MLD  to right UE and trunk    Time 4    Period Weeks    Status New    Target Date 01/02/21      PT LONG TERM GOAL #3   Title Pt will have minimal difference in circumference of both arms    Time 6    Period Weeks    Status New    Target Date 01/16/21      PT LONG TERM GOAL #4   Title Pt will understand precautions for skin care and decreasing risk of infection/lymphedema    Time 6    Period Weeks    Status New    Target Date 01/16/21      PT LONG TERM GOAL #5   Title Pt will be independent in HEP for shoulder ROM and strengthening    Time 6                 Plan - 01/02/21 0932    Clinical Impression Statement Pt with mild cording noted at right axillary region. Shoulder ROM overall improved. She did very well with self MLD today and didn't require many VC's today. She is  compliant with MLD at home but notes occasional times when she forgets directions.    Personal Factors and Comorbidities Comorbidity 1;Comorbidity 3+    Comorbidities Breast CA reoccurence, Aids, aortic atherosclerosis    Stability/Clinical Decision Making Stable/Uncomplicated    Rehab Potential Good  PT Frequency 2x / week    PT Duration 6 weeks    PT Treatment/Interventions ADLs/Self Care Home Management;Therapeutic activities;Therapeutic exercise;Neuromuscular re-education;Manual techniques;Orthotic Fit/Training;Patient/family education;Manual lymph drainage;Compression bandaging;Scar mobilization;Passive range of motion    PT Next Visit Plan Cont and review self MLD to Rt UE (no anterior inter-axillary anastomosis) having pt return demo; cont bilateral shoulder P/ROM/MFR to chest,axilla, Supine flex/scaption /Horizontal abd AROM  progressing to band when ready    PT Home Exercise Plan Post op shoulder ROM HEP, supine flex/scaption with wand, self MLD    Consulted and Agree with Plan of Care Patient           Patient will benefit from skilled therapeutic intervention in order to improve the following deficits and impairments:  Decreased knowledge of precautions,Decreased range of motion,Decreased skin integrity,Decreased scar mobility,Decreased strength,Increased edema,Increased fascial restricitons,Postural dysfunction  Visit Diagnosis: Inflammatory breast cancer, left (HCC)  Localized edema  Abnormal posture  Inflammatory breast cancer, right (HCC)  Malignant neoplasm of upper-outer quadrant of left breast in female, estrogen receptor positive ([place])     Problem List Patient Active Problem List   Diagnosis Date Noted  . 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 Drug-induced neutropenia ([place]) 06/28/2020  . Thrombocytopenia ([place]) 06/07/2020  . Port-A-Cath in place 04/19/2020  . Malignant neoplasm of upper-outer quadrant of left  breast in female, estrogen receptor negative ([place]) 03/05/2020  . Rash and nonspecific skin eruption 11/08/2013  . History of breast cancer 12/20/2012  . HYPERTENSION, BENIGN ESSENTIAL 08/11/2007  . HIV DISEASE 04/14/2007    Claris Pong 01/02/2021, 10:07 AM  [place], [place], [address] Phone: [phone]   Fax:  [phone]  Name: Victoria Coffey MRN: [id number] Date of Birth: 03/01/1956 Cheral Almas, PT 01/02/21 10:08 AM

## 2021-01-03 ENCOUNTER — Ambulatory Visit
Admission: RE | Admit: 2021-01-03 | Discharge: 2021-01-03 | Disposition: A | Discharge status: Home / Self Care | Payer: BC Managed Care – PPO | Source: Ambulatory Visit | Attending: Radiation Oncology | Admitting: Radiation Oncology

## 2021-01-03 ENCOUNTER — Telehealth: Payer: Self-pay | Admitting: Physician Assistant

## 2021-01-03 ENCOUNTER — Ambulatory Visit: Payer: BC Managed Care – PPO

## 2021-01-03 ENCOUNTER — Other Ambulatory Visit: Payer: Self-pay

## 2021-01-03 DIAGNOSIS — C50412 Malignant neoplasm of upper-outer quadrant of left female breast: Secondary | ICD-10-CM | POA: Diagnosis not present

## 2021-01-03 NOTE — Progress Notes (Signed)
[place]   [phone] [phone]   Clinic Follow up Note   Patient Care Team: [name], MD as PCP - General (Infectious Diseases) [name], MD as PCP - Infectious Diseases (Infectious Diseases) [name], MD as Consulting Physician (General Surgery) [name], MD as Consulting Physician (Hematology) [name], MD as Consulting Physician (Radiation Oncology) [name], RN as Oncology Nurse Navigator [name], RN as Oncology Nurse Navigator  Date of Service:  01/06/2021  CHIEF COMPLAINT: F/u ofbilateralbreast cancer  SUMMARY OF ONCOLOGIC HISTORY: Oncology History Overview Note  Cancer Staging Bilateral breast cancer [facility]) Staging form: Breast, AJCC 8th Edition - Pathologic stage from 10/01/2020: Stage IIIC (pT3, pN2a, cM0, G3, ER-, PR-, HER2-) - Signed by Victoria Feeling, NP on 10/24/2020  Malignant neoplasm of upper-outer quadrant of left breast in female, estrogen receptor positive (Victoria Coffey) Staging form: Breast, AJCC 8th Edition - Clinical stage from 03/01/2020: Stage IIIC (cT4, cN0, cM0, G3, ER+, PR-, HER2-) - Signed by [name], MD on 08/22/2020 - Pathologic stage from 10/01/2020: Stage IIIC (pT4b, pN0, cM0, G3, ER-, PR-, HER2-) - Signed by Victoria Feeling, NP on 10/24/2020    Malignant neoplasm of upper-outer quadrant of left breast in female, estrogen receptor negative (Victoria Coffey)  02/28/2020 Mammogram   Diagnostic Mammogram 02/28/20  IMPRESSION The 3.9 cm ill defined mass in the left breast posterior depth central 4.3cm to the nipple seen on the mediolateral oblique view onlt with associated difssue skin thickening is highly suspicious for malignancy.    03/01/2020 Cancer Staging   Staging form: Breast, AJCC 8th Edition - Clinical stage from 03/01/2020: Stage IIIC (cT4, cN0, cM0, G3, ER+, PR-, HER2-) - Signed by [name], MD on 08/22/2020   03/01/2020 Initial Biopsy   Diagnosis 03/01/20  1. Breast, left,  needle core biopsy, 9 o'clock, 3-4cmfn - INVASIVE DUCTAL CARCINOMA. SEE NOTE 2. Lymph node, needle/core biopsy, right axilla - INVASIVE DUCTAL CARCINOMA. SEE NOTE Diagnosis Note 1. Invasive carcinoma measures 1.5 cm in greatest linear dimension and appears grade 3. Dr. [name] reviewed the case and concurs with the diagnosis. A breast prognostic profile (ER, PR, Ki-67 and HER2) is pending and will be reported in an addendum. Dr. [name] was notified on 03/04/2020. 2. Carcinoma measures 0.6 cm in greatest linear dimension and appears grade 3. Lymphoid tissue is not identified - this may represent a completely replaced lymph node. A breast prognostic profile (ER, PR, Ki-67 and HER2) is pending and will be reported in an addendum. Dr. [name] reviewed the case and concurs with the diagnosis. Dr. [name] was notified on 03/04/2020.   03/01/2020 Receptors her2   1. PROGNOSTIC INDICATORS Results: IMMUNOHISTOCHEMICAL AND MORPHOMETRIC ANALYSIS PERFORMED MANUALLY The tumor cells are NEGATIVE for Her2 (0). Estrogen Receptor: 20%, POSITIVE, WEAK STAINING INTENSITY Progesterone Receptor: 0%, NEGATIVE Proliferation Marker Ki67: 40% COMMENT: The negative hormone receptor study(ies) in this case has an internal positive control.    2. PROGNOSTIC INDICATORS Results: IMMUNOHISTOCHEMICAL AND MORPHOMETRIC ANALYSIS PERFORMED MANUALLY The tumor cells are NEGATIVE for Her2 (1+). Estrogen Receptor: 10%, POSITIVE, WEAK STAINING INTENSITY Progesterone Receptor: 0%, NEGATIVE Proliferation Marker Ki67: 40% COMMENT: The negative hormone receptor study(ies) in this case has no internal positive control.   03/05/2020 Initial Diagnosis   Malignant neoplasm of upper-outer quadrant of left breast in female, estrogen receptor positive (Victoria Coffey)   03/18/2020 Breast MRI   IMPRESSION: 1. 7 x 9 x 6 cm area of suspicious non masslike enhancement within the central/UPPER RIGHT breast  with anterior skin thickening.  Given biopsy-proven metastatic RIGHT axillary lymph node, tissue sampling of the anterior and posterior aspects of this non masslike RIGHT breast enhancement is recommended to exclude malignancy. 2. 7 x 6 x 5 cm diffuse masslike and nonmasslike enhancement within the majority of the remaining LEFT breast with diffuse LEFT breast skin thickening, compatible with malignancy. Abnormal enhancement is noted along the anterior aspect of the LEFT pectoralis muscle. 3. Three abnormal RIGHT axillary lymph nodes, 1 which is biopsy proven to be metastatic disease. No abnormal LEFT axillary lymph nodes identified.   03/20/2020 Echocardiogram   Baseline ECHO  IMPRESSIONS     1. The patient was reported to be in pain during the study and not able  to participate in this study. All that can be said is that the LV and RV  function are grossly normal. Would recommend to repeat this study when/if  the patient is able to tolerate  the exam.   2. Left ventricular ejection fraction, by estimation, is 60 to 65%. The  left ventricle has normal function. Left ventricular endocardial border  not optimally defined to evaluate regional wall motion. Left ventricular  diastolic function could not be  evaluated.   3. Right ventricular systolic function is normal. The right ventricular  size is normal.   4. The mitral valve was not well visualized. No evidence of mitral valve  regurgitation.   5. The aortic valve was not well visualized. Aortic valve regurgitation  is not visualized.   6. The inferior vena cava is normal in size with greater than 50%  respiratory variability, suggesting right atrial pressure of 3 mmHg.    03/20/2020 Imaging   CT CAP W contrast  IMPRESSION: 1. Small right axillary/subpectoral lymph nodes. Difficult to exclude metastatic disease. 2. Subtle 6 mm low-attenuation lesion in the dome of the liver, not well seen previously. Lesion is too small to characterize. Further evaluation  could be performed with MR abdomen without and with contrast, as clinically indicated. 3. Hepatic steatosis. 4. Aortic atherosclerosis (ICD10-I70.0). Coronary artery calcification.     03/20/2020 Imaging   Whole body bone scan  IMPRESSION: 1. Increased activity noted over the right breast, possibly related to the patient's right breast cancer.   2. Punctate area of increased activity over the left maxillary region, most likely related to sinus disease or dental disease. Increased activity noted over the left mandible also possibly related to dental disease. No other focal bony abnormalities to suggest metastatic disease identified.   03/22/2020 Procedure   PAC placement by Dr [name]    03/22/2020 - 08/23/2020 Chemotherapy   AC q2weeks for 4 cycles 03/22/20-05/03/20 followed by weekly Taxol and Carboplatin q3weeks for 12 weeks starting 05/17/20.                  Week 2 Taxol reduced to half dose and CT was dose reduced starting with week 4 due to thrombocytopenia. Given pancytopenia, will change Carboplatin to 50% dose weekly with Taxol 22m/m2 starting with week 7 (06/13/20). Will hold Carboplatin as needed based on labs. Carboplatin increased to 80% starting with week 8. Carboplatin dose reduced for C11 and held with C12. C12 Taxol postponed to 08/23/20   03/22/2020 Pathology Results   FINAL MICROSCOPIC DIAGNOSIS:   A. BREAST, RIGHT, PUNCH BIOPSY:  - Carcinoma involving dermal lymphatics.  - See comment.   B. BREAST, LEFT, PUNCH BIOPSY:  - Carcinoma involving dermal lymphatics.  - See comment.   COMMENT:  The  morphology is compatible with ductal breast carcinoma.    ADDENDUM:   PROGNOSTIC INDICATOR RESULTS:   Immunohistochemical and morphometric analysis performed manually   The tumor cells are NEGATIVE for Her2 (0%).   Estrogen Receptor:       NEGATIVE, 0%  Progesterone Receptor:   NEGATIVE, 0%    08/13/2020 Breast MRI   IMPRESSION: 1. Interval significant response to  treatment of the diffuse non-mass enhancement involving the UPPER RIGHT breast. There is a solitary residual 5 mm indeterminate mass in the UPPER OUTER QUADRANT at MIDDLE depth with plateau kinetics. 2. Minimal response to treatment of the diffuse non-mass enhancement throughout the LEFT breast. 3. Resolution of enhancement of the skin and nipple-areolar complex of the RIGHT breast. 4. Near complete resolution of enhancement of the skin and nipple-areolar complex of the LEFT breast. Minimal residual nipple-areola complex enhancement persists. 5. The 3 previously identified pathologic RIGHT axillary lymph nodes are now normal in appearance. There is no pathologic lymphadenopathy currently.   08/23/2020 -  Chemotherapy   Immunotherapy Keytruda q3weeks starting 08/23/20 for 1 year treatment.    10/01/2020 Surgery   MASTECTOMIES WITH RIGHT SENTINEL LYMPH NODE BIOPSIES,RIGHT TARGETED RADIOACTIVE SEED LYMPH NODE, LEFT BREAST MODIFIED RADICAL MASTECTOMY  and RADIOACTIVE SEED GUIDED  TARGETED RIGHT SENTINEL LYMPH NODE BIOPSY by Dr [name]     10/01/2020 Pathology Results   FINAL MICROSCOPIC DIAGNOSIS:   A. BREAST, LEFT, MASTECTOMY:  - Invasive ductal carcinoma, 7 cm.  - Anterior and posterior soft tissue margins not involved by tumor.  - Foci of skin involvement by tumor, see comment.  - Nipple involvement by tumor.  - See oncology table and comment.   B. LYMPH NODE, LEFT AXILLARY, CONTENTS:  - One lymph node with no metastatic carcinoma (0/1).   C. BREAST, RIGHT, MASTECTOMY:  - Invasive ductal carcinoma, greater than 5 cm.  - Margins not involved.  - Subareolar breast tissue involved by tumor.  - See oncology table and comment.   D. LYMPH NODE, RIGHT #2, SENTINEL, EXCISION:  - Metastatic carcinoma in one lymph node, 0.5 cm (1/1).   E. LYMPH NODE, RIGHT #1, SENTINEL, EXCISION:  - Metastatic carcinoma in one lymph node, 1.5 cm (1/1).  - Biopsy site and biopsy clip.   F. LYMPH  NODE, RIGHT #3, SENTINEL, EXCISION:  - Metastatic carcinoma in one lymph node, 1.5 cm (1/1).   G. LYMPH NODE, RIGHT #4, SENTINEL, EXCISION:  - One lymph node with no metastatic carcinoma (0/1).   H. LYMPH NODE, RIGHT #5, SENTINEL, EXCISION:  - Metastatic carcinoma in one lymph node, 0.5 cm (1/1).   I. LYMPH NODE, RIGHT #6, SENTINEL, EXCISION:  - One lymph node with no metastatic carcinoma (0/1).    10/01/2020 Cancer Staging   Staging form: Breast, AJCC 8th Edition - Pathologic stage from 10/01/2020: Stage IIIC (pT4b, pN0, cM0, G3, ER-, PR-, HER2-) - Signed by Victoria Feeling, NP on 10/24/2020   10/14/2020 Surgery   RIGHT AXILLARY LYMPH NODE DISSECTION by Dr [name]  -1 /13 positive LN for metastatic carcinoma    11/18/2020 -  Chemotherapy   Adjuvant Xeloda 1500 mg BID days 1-14 q21 days x6 months, starting 11/18/2020 (will reduce to 1500 mg Am/1000 mg Pm M-F during radiation)   12/10/2020 - 01/23/2021 Radiation Therapy   Adjuvant Radiation to right chest wall and axilla with Dr Lisbeth Renshaw on 12/10/20-01/23/21      CURRENT THERAPY:  1.Keytruda q3weeks starting 08/23/20 for 1 year treatment. 2.Adjuvant Xeloda1500 mg BID days  1-14 q21 days x6 months, starting 11/18/2020(will reduce to 1500 mg Am/1000 mg Pm M-F during radiation) 3. Adjuvant Radiation toright chest wall and axilla with Dr Lisbeth Renshaw on 12/10/20-01/23/21  INTERVAL HISTORY:  Florean Hoobler Baldwin is here for a follow up. She presents to the clinic alone. She notes radiation is going well. She notes she is getting more fatigued as it goes. She notes her skin around her breast is darkening and mild skin breakdown. She notes she started her Radiation booster 3 days ago. She notes 2 of her tabs has not come off her breast from radiation. She has tried to rub it off in her shower. She notes she wears compression of her right arm given PT suggestion to prevent swelling. She denies breast pain. She notes her hair is growing back  She is still on oral  potassium 3 tabs once day. She denies diarrhea.    REVIEW OF SYSTEMS:   Constitutional: Denies fevers, chills or abnormal weight loss (+) Fatigue  Eyes: Denies blurriness of vision Ears, nose, mouth, throat, and face: Denies mucositis or sore throat Respiratory: Denies cough, dyspnea or wheezes Cardiovascular: Denies palpitation, chest discomfort or lower extremity swelling Gastrointestinal:  Denies nausea, heartburn or change in bowel habits Skin: Denies abnormal skin rashes Lymphatics: Denies new lymphadenopathy or easy bruising Neurological:Denies numbness, tingling or new weaknesses Behavioral/Psych: Mood is stable, no new changes  All other systems were reviewed with the patient and are negative.  MEDICAL HISTORY:  Past Medical History:  Diagnosis Date  . Aortic atherosclerosis ([place])   . Breast cancer ([place]) dx'd 1993   left  . Fatty liver   . History of left breast cancer   . HIV infection ([place])   . Hypertension   . Port-A-Cath in place     SURGICAL HISTORY: Past Surgical History:  Procedure Laterality Date  . AXILLARY LYMPH NODE DISSECTION Right 10/14/2020   Procedure: left axilla removal of two drains right axillary lymph node dissection;  Surgeon: [name], MD;  Location: [facility];  Service: General;  Laterality: Right;  PEC BLOCK, RNFA OR PA  . BREAST BIOPSY Bilateral 03/22/2020   Procedure: BILATERAL BREAST PUNCH BIOPSIES;  Surgeon: [name], MD;  Location: [place];  Service: General;  Laterality: Bilateral;  . BREAST LUMPECTOMY    . CESAREAN SECTION    . left breast lumpectomy  1993  . MASTECTOMY W/ SENTINEL NODE BIOPSY Bilateral 10/01/2020  . MASTECTOMY W/ SENTINEL NODE BIOPSY Bilateral 10/01/2020   Procedure: BILATERAL MASTECTOMIES WITH RIGHT SENTINEL LYMPH NODE BIOPSIES,RIGHT TARGETED RADIOACTIVE SEED LYMPH NODE, LEFT BREAST MODIFIED RADICAL MASTECTOMY ;  Surgeon: [name], MD;  Location: [place];  Service: General;  Laterality: Bilateral;  PEC BLOCK,  RNFA  . PORTACATH PLACEMENT Right 03/22/2020   Procedure: INSERTION PORT-A-CATH WITH ULTRASOUND GUIDANCE;  Surgeon: [name], MD;  Location: [place];  Service: General;  Laterality: Right;  . RADIOACTIVE SEED GUIDED AXILLARY SENTINEL LYMPH NODE Right 10/01/2020   Procedure: RADIOACTIVE SEED GUIDED  TARGETED RIGHT SENTINEL LYMPH NODE BIOPSY;  Surgeon: [name], MD;  Location: [place];  Service: General;  Laterality: Right;  PEC BLOCK, RNFA    I have reviewed the social history and family history with the patient and they are unchanged from previous note.  ALLERGIES:  is allergic to codeine, other, grapefruit bioflavonoid complex, pomegranate [punica], and shellfish allergy.  MEDICATIONS:  Current Outpatient Medications  Medication Sig Dispense Refill  . acetaminophen (TYLENOL) 500 MG tablet Take 1,000 mg by mouth  every 6 (six) hours as needed for moderate pain or headache.     . capecitabine (XELODA) 500 MG tablet Take 3 tabs in morning and 2 tabs in evening, on days of radiation, Monday through Friday. 75 tablet 1  . emtricitabine-rilpivir-tenofovir AF (ODEFSEY) 200-25-25 MG TABS tablet Take 1 tablet by mouth daily with breakfast. 30 tablet 11  . hydrochlorothiazide (HYDRODIURIL) 25 MG tablet TAKE 1 TABLET(25 MG) BY MOUTH DAILY 30 tablet 5  . lidocaine-prilocaine (EMLA) cream Apply 1 application topically See admin instructions. Apply 1-2 hours prior to access    . loratadine (CLARITIN) 10 MG tablet Take 10 mg by mouth See admin instructions. Takes 3-4 days before and after treatment    . potassium chloride SA (KLOR-CON) 20 MEQ tablet Take 1 tablet (20 mEq total) by mouth daily. 30 tablet 0  . traMADol (ULTRAM) 50 MG tablet Take 1 tablet (50 mg total) by mouth every 6 (six) hours as needed (mild pain). 30 tablet 1   No current facility-administered medications for this visit.    PHYSICAL EXAMINATION: ECOG PERFORMANCE STATUS: 1 - Symptomatic but completely ambulatory  Vitals:    01/06/21 1031  BP: (!) 129/96  Pulse: 96  Resp: 16  Temp: 97.8 F (36.6 C)  SpO2: 100%   Filed Weights   01/06/21 1031  Weight: 128 lb 12.8 oz (58.4 kg)    GENERAL:alert, no distress and comfortable SKIN: skin color, texture, turgor are normal, no rashes or significant lesions EYES: normal, Conjunctiva are pink and non-injected, sclera clear  NECK: supple, thyroid normal size, non-tender, without nodularity LYMPH:  no palpable lymphadenopathy in the cervical, axillary  LUNGS: clear to auscultation and percussion with normal breathing effort HEART: regular rate & rhythm and no murmurs and no lower extremity edema ABDOMEN:abdomen soft, non-tender and normal bowel sounds Musculoskeletal:no cyanosis of digits and no clubbing  NEURO: alert & oriented x 3 with fluent speech, no focal motor/sensory deficits BREAST: S/p b/l mastectomy: Surgical incisions healed well. (+) Mild right chest soft tissue fullness. (+) Small left chest skin nodule, likely skin boil. (+) Skin hyperpigmentation and Mild skin breakdown of left chest wall from RT  LABORATORY DATA:  I have reviewed the data as listed CBC Latest Ref Rng & Units 01/06/2021 12/30/2020 12/26/2020  WBC 4.0 - 10.5 K/uL 3.9(L) 4.4 3.2(L)  Hemoglobin 12.0 - 15.0 g/dL 11.1(L) 11.2(L) 11.2(L)  Hematocrit 36.0 - 46.0 % 32.5(L) 32.6(L) 32.3(L)  Platelets 150 - 400 K/uL 205 218 201     CMP Latest Ref Rng & Units 01/06/2021 12/30/2020 12/26/2020  Glucose 70 - 99 mg/dL 99 104(H) 100(H)  BUN 8 - 23 mg/dL $Remove'16 17 16  'YzjnXdx$ Creatinine 0.44 - 1.00 mg/dL 0.96 0.97 0.98  Sodium 135 - 145 mmol/L 135 132(L) 138  Potassium 3.5 - 5.1 mmol/L 4.4 4.2 3.8  Chloride 98 - 111 mmol/L 105 102 103  CO2 22 - 32 mmol/L 22 20(L) 24  Calcium 8.9 - 10.3 mg/dL 9.1 8.9 8.9  Total Protein 6.5 - 8.1 g/dL 7.6 7.7 7.6  Total Bilirubin 0.3 - 1.2 mg/dL 0.4 0.4 0.4  Alkaline Phos 38 - 126 U/L 531(H) 506(H) 557(H)  AST 15 - 41 U/L 86(H) 74(H) 70(H)  ALT 0 - 44 U/L 69(H) 56(H) 64(H)       RADIOGRAPHIC STUDIES: I have personally reviewed the radiological images as listed and agreed with the findings in the report. No results found.   ASSESSMENT & PLAN:  Victoria Coffey is a 65 y.o.  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 -Further work up showed 9cm nodular area in right breast and skin biopsy confirmed cancer involvement -Staging scans were negative for distant metastasis -She completed AC X4 and weekly TC 03/22/20-08/23/20.  -Given recentpublish Keynote 522 trial data,I started her on Immunotherapy Keytruda q3weeks for 1 year of treatment beginning 08/23/20. -She underwent b/l mastectomy with Dr [name] on 10/01/20.Shealsounderwent right axillary lymph node dissection on 10/14/2020. -Due tohersignificantresidual diseasein LNs,Istarted her onadjuvant Xeloda1500 mg twice daily, 2 weeks on and one week off($RemoveBefo'1500mg'HYPKxQeMrSS$  am, $Re'1000mg'Ouz$  pm M-F when she is on radiation) for6 monthson 1/17/22to reduce her high risk of recurrence after surgery. -She startedadjuvant radiation to right chest wall and axillaon 12/10/20-01/23/21 with Dr Lisbeth Renshaw.Tolerating well with increased fatigue and beginning of mild skin breakdown.  -She is tolerating Xeloda well without major side effects.  -Labs reviewed, WBC 3.9, Hg 11.1, Albumin 3.4, AST 86, ALT 69, Alk Phos 531. Overall adequate to continue treatment. She will continue PT.  -Her CA 27.29 has significantly increased from 582.2 to 1387.9 recently. I discussed scanning her sooner with PET, before completing Radiation. She is agreeable. Her physical exam today was normal -F/u next  week.   2.Cytopenias -secondary to chemo -Mild and stable anemia and intermittent leukopenia.Continue monitoring labs weekly  3. H/o Left breast cancer, Dx in 1993s/p lumpectomy, radiation, chemo, Tamoxifen.Genetic Testinghas not been done yet.  4. Comorbidities: HIV(+)Dx 1993, HTN -Her HIV has been undetectable and well controlled. She is currently being seen by ID Dr Graylon Good -I previously referred her for Evusheld injection for COVID prevention  5.Mild transaminitis, secondary to chemo  -We will monitor   PLAN: -lab reviewed. Potassium normal today. I refilled Potassium to once daily.  -Continue CCRT withXeloda $RemoveBeforeD'1500mg'hzUybUqBwZzVEE$  am and $Remo'1000mg'uxGPy$  pm Mondays through Fridays, I refilled today  -Continue Keytruda every 3 weeks, due next week  -Lab, flush, F/u on 3/14 -PET in 1-2 weeks.    No problem-specific Assessment & Plan notes found for this encounter.   No orders of the defined types were placed in this encounter.  All questions were answered. The patient knows to call the clinic with any problems, questions or concerns. No barriers to learning was detected. The total time spent in the appointment was 30 minutes.     [name], MD 01/06/2021   I, Joslyn Devon, am acting as scribe for [name], MD.   I have reviewed the above documentation for accuracy and completeness, and I agree with the above.

## 2021-01-03 NOTE — Telephone Encounter (Signed)
We received updated FDA guidance that all patients who previously received Tixagevimab150mg /Cilgavimab150mg  should receive another 150mg  dose to equal the new updated dose of 300mg Tixagevimab/300mg  Cilgavimab.    I left a message and sent a mychart message that she will require a second dose to achieve full protection for PEP.    Nell Range PA-C

## 2021-01-06 ENCOUNTER — Ambulatory Visit: Payer: BC Managed Care – PPO

## 2021-01-06 ENCOUNTER — Inpatient Hospital Stay: Payer: BC Managed Care – PPO | Attending: Hematology | Admitting: Hematology

## 2021-01-06 ENCOUNTER — Inpatient Hospital Stay: Payer: BC Managed Care – PPO

## 2021-01-06 ENCOUNTER — Other Ambulatory Visit: Payer: Self-pay

## 2021-01-06 ENCOUNTER — Ambulatory Visit
Admission: RE | Admit: 2021-01-06 | Discharge: 2021-01-06 | Disposition: A | Discharge status: Home / Self Care | Payer: BC Managed Care – PPO | Source: Ambulatory Visit | Attending: Radiation Oncology | Admitting: Radiation Oncology

## 2021-01-06 ENCOUNTER — Encounter: Payer: Self-pay | Admitting: Hematology

## 2021-01-06 VITALS — BP 129/96 | HR 96 | Temp 97.8°F | Resp 16 | Ht 61.0 in | Wt 128.8 lb

## 2021-01-06 DIAGNOSIS — C787 Secondary malignant neoplasm of liver and intrahepatic bile duct: Secondary | ICD-10-CM | POA: Diagnosis not present

## 2021-01-06 DIAGNOSIS — I10 Essential (primary) hypertension: Secondary | ICD-10-CM | POA: Diagnosis not present

## 2021-01-06 DIAGNOSIS — R7401 Elevation of levels of liver transaminase levels: Secondary | ICD-10-CM | POA: Insufficient documentation

## 2021-01-06 DIAGNOSIS — Z171 Estrogen receptor negative status [ER-]: Secondary | ICD-10-CM | POA: Insufficient documentation

## 2021-01-06 DIAGNOSIS — C50412 Malignant neoplasm of upper-outer quadrant of left female breast: Secondary | ICD-10-CM

## 2021-01-06 DIAGNOSIS — Z5112 Encounter for antineoplastic immunotherapy: Secondary | ICD-10-CM | POA: Diagnosis not present

## 2021-01-06 DIAGNOSIS — Z170 Estrogen receptor positive status [ER+]: Secondary | ICD-10-CM

## 2021-01-06 DIAGNOSIS — D6481 Anemia due to antineoplastic chemotherapy: Secondary | ICD-10-CM | POA: Diagnosis not present

## 2021-01-06 DIAGNOSIS — Z923 Personal history of irradiation: Secondary | ICD-10-CM | POA: Insufficient documentation

## 2021-01-06 DIAGNOSIS — B20 Human immunodeficiency virus [HIV] disease: Secondary | ICD-10-CM | POA: Diagnosis not present

## 2021-01-06 DIAGNOSIS — E876 Hypokalemia: Secondary | ICD-10-CM | POA: Insufficient documentation

## 2021-01-06 DIAGNOSIS — Z21 Asymptomatic human immunodeficiency virus [HIV] infection status: Secondary | ICD-10-CM | POA: Diagnosis not present

## 2021-01-06 DIAGNOSIS — C50411 Malignant neoplasm of upper-outer quadrant of right female breast: Secondary | ICD-10-CM | POA: Diagnosis not present

## 2021-01-06 DIAGNOSIS — Z95828 Presence of other vascular implants and grafts: Secondary | ICD-10-CM

## 2021-01-06 DIAGNOSIS — Z9013 Acquired absence of bilateral breasts and nipples: Secondary | ICD-10-CM | POA: Diagnosis not present

## 2021-01-06 LAB — CBC WITH DIFFERENTIAL (CANCER CENTER ONLY)
Abs Immature Granulocytes: 0.02 10*3/uL (ref 0.00–0.07)
Basophils Absolute: 0 10*3/uL (ref 0.0–0.1)
Basophils Relative: 0 %
Eosinophils Absolute: 0 10*3/uL (ref 0.0–0.5)
Eosinophils Relative: 1 %
HCT: 32.5 % — ABNORMAL LOW (ref 36.0–46.0)
Hemoglobin: 11.1 g/dL — ABNORMAL LOW (ref 12.0–15.0)
Immature Granulocytes: 1 %
Lymphocytes Relative: 10 %
Lymphs Abs: 0.4 10*3/uL — ABNORMAL LOW (ref 0.7–4.0)
MCH: 30.6 pg (ref 26.0–34.0)
MCHC: 34.2 g/dL (ref 30.0–36.0)
MCV: 89.5 fL (ref 80.0–100.0)
Monocytes Absolute: 0.5 10*3/uL (ref 0.1–1.0)
Monocytes Relative: 12 %
Neutro Abs: 3 10*3/uL (ref 1.7–7.7)
Neutrophils Relative %: 76 %
Platelet Count: 205 10*3/uL (ref 150–400)
RBC: 3.63 MIL/uL — ABNORMAL LOW (ref 3.87–5.11)
RDW: 18.9 % — ABNORMAL HIGH (ref 11.5–15.5)
WBC Count: 3.9 10*3/uL — ABNORMAL LOW (ref 4.0–10.5)
nRBC: 0 % (ref 0.0–0.2)

## 2021-01-06 LAB — CMP (CANCER CENTER ONLY)
ALT: 69 U/L — ABNORMAL HIGH (ref 0–44)
AST: 86 U/L — ABNORMAL HIGH (ref 15–41)
Albumin: 3.4 g/dL — ABNORMAL LOW (ref 3.5–5.0)
Alkaline Phosphatase: 531 U/L — ABNORMAL HIGH (ref 38–126)
Anion gap: 8 (ref 5–15)
BUN: 16 mg/dL (ref 8–23)
CO2: 22 mmol/L (ref 22–32)
Calcium: 9.1 mg/dL (ref 8.9–10.3)
Chloride: 105 mmol/L (ref 98–111)
Creatinine: 0.96 mg/dL (ref 0.44–1.00)
GFR, Estimated: >60 mL/min (ref >60)
Glucose, Bld: 99 mg/dL (ref 70–99)
Potassium: 4.4 mmol/L (ref 3.5–5.1)
Sodium: 135 mmol/L (ref 135–145)
Total Bilirubin: 0.4 mg/dL (ref 0.3–1.2)
Total Protein: 7.6 g/dL (ref 6.5–8.1)

## 2021-01-06 MED ORDER — POTASSIUM CHLORIDE CRYS ER 20 MEQ PO TBCR: 20.0000 meq | EXTENDED_RELEASE_TABLET | Freq: Every day | ORAL | 0 refills | Status: AC

## 2021-01-06 MED ORDER — SODIUM CHLORIDE 0.9% FLUSH
10.0000 mL | Freq: Once | INTRAVENOUS | Status: AC
  Administered 2021-01-06: 10 mL
  Filled 2021-01-06: qty 10

## 2021-01-06 MED ORDER — HEPARIN SOD (PORK) LOCK FLUSH 100 UNIT/ML IV SOLN
500.0000 [IU] | Freq: Once | INTRAVENOUS | Status: AC
Start: 2021-01-06 — End: 2021-01-06
  Administered 2021-01-06: 500 [IU]
  Filled 2021-01-06: qty 5

## 2021-01-07 ENCOUNTER — Telehealth: Payer: Self-pay | Admitting: Pharmacist

## 2021-01-07 ENCOUNTER — Ambulatory Visit
Admission: RE | Admit: 2021-01-07 | Discharge: 2021-01-07 | Disposition: A | Discharge status: Home / Self Care | Payer: BC Managed Care – PPO | Source: Ambulatory Visit | Attending: Radiation Oncology | Admitting: Radiation Oncology

## 2021-01-07 ENCOUNTER — Ambulatory Visit: Payer: BC Managed Care – PPO

## 2021-01-07 ENCOUNTER — Other Ambulatory Visit: Payer: Self-pay | Admitting: Hematology

## 2021-01-07 ENCOUNTER — Other Ambulatory Visit: Payer: Self-pay

## 2021-01-07 DIAGNOSIS — C50912 Malignant neoplasm of unspecified site of left female breast: Secondary | ICD-10-CM

## 2021-01-07 DIAGNOSIS — Z170 Estrogen receptor positive status [ER+]: Secondary | ICD-10-CM

## 2021-01-07 DIAGNOSIS — C50412 Malignant neoplasm of upper-outer quadrant of left female breast: Secondary | ICD-10-CM | POA: Diagnosis not present

## 2021-01-07 DIAGNOSIS — R600 Localized edema: Secondary | ICD-10-CM

## 2021-01-07 DIAGNOSIS — C50911 Malignant neoplasm of unspecified site of right female breast: Secondary | ICD-10-CM

## 2021-01-07 DIAGNOSIS — R293 Abnormal posture: Secondary | ICD-10-CM

## 2021-01-07 MED ORDER — CAPECITABINE 500 MG PO TABS: ORAL_TABLET | ORAL | 0 refills | Status: DC

## 2021-01-07 NOTE — Therapy (Signed)
[place], [place], [address] Phone: [phone]   Fax:  [phone]  Physical Therapy Treatment  Patient Details  Name: Victoria Coffey MRN: [id number] Date of Birth: 28-Sep-1956 Referring Provider (PT): Dr Burr Medico   Encounter Date: 01/07/2021   PT End of Session - 01/07/21 0944    Visit Number 7    Number of Visits 13    Date for PT Re-Evaluation 01/16/21    PT Start Time 0903    PT Stop Time 0959    PT Time Calculation (min) 56 min    Activity Tolerance Patient tolerated treatment well    Behavior During Therapy [facility] for tasks assessed/performed           Past Medical History:  Diagnosis Date  . Aortic atherosclerosis ([place])   . Breast cancer ([place]) dx'd 1993   left  . Fatty liver   . History of left breast cancer   . HIV infection ([place])   . Hypertension   . Port-A-Cath in place     Past Surgical History:  Procedure Laterality Date  . AXILLARY LYMPH NODE DISSECTION Right 10/14/2020   Procedure: left axilla removal of two drains right axillary lymph node dissection;  Surgeon: [name], MD;  Location: [facility];  Service: General;  Laterality: Right;  PEC BLOCK, RNFA OR PA  . BREAST BIOPSY Bilateral 03/22/2020   Procedure: BILATERAL BREAST PUNCH BIOPSIES;  Surgeon: [name], MD;  Location: [place];  Service: General;  Laterality: Bilateral;  . BREAST LUMPECTOMY    . CESAREAN SECTION    . left breast lumpectomy  1993  . MASTECTOMY W/ SENTINEL NODE BIOPSY Bilateral 10/01/2020  . MASTECTOMY W/ SENTINEL NODE BIOPSY Bilateral 10/01/2020   Procedure: BILATERAL MASTECTOMIES WITH RIGHT SENTINEL LYMPH NODE BIOPSIES,RIGHT TARGETED RADIOACTIVE SEED LYMPH NODE, LEFT BREAST MODIFIED RADICAL MASTECTOMY ;  Surgeon: [name], MD;  Location: [place];  Service: General;  Laterality: Bilateral;  PEC BLOCK, RNFA  . PORTACATH PLACEMENT Right 03/22/2020   Procedure: INSERTION PORT-A-CATH WITH ULTRASOUND  GUIDANCE;  Surgeon: [name], MD;  Location: [place];  Service: General;  Laterality: Right;  . RADIOACTIVE SEED GUIDED AXILLARY SENTINEL LYMPH NODE Right 10/01/2020   Procedure: RADIOACTIVE SEED GUIDED  TARGETED RIGHT SENTINEL LYMPH NODE BIOPSY;  Surgeon: [name], MD;  Location: [place];  Service: General;  Laterality: Right;  PEC BLOCK, RNFA    There were no vitals filed for this visit.   Subjective Assessment - 01/07/21 0902    Subjective Had my radiation this am.  Skin is holding out well and I am applying the cream. Skin is darkening some. Shoulders are doing well, but chest still feels tight. I am starting to feel more fatigued from the radiation and my appetite isn't as good.  ginger ale helps. Still no word on the other sleeve.  Have been doing MLD at home and don't feel the swelling on right side.    Pertinent History Patient was diagnosed on 02/28/2020 with left invasive ductal carcinoma breast cancer. The mass measured 3.9 cm and was located in the upper outer quadrant. It was ER positive, PR negative, and HER2 negative with a Ki67 of 40%. Her left breast appeared to be retracted with significant skin changes. She also had 3 abnormal appearing nodes in the RIGHT axilla and 1 was biopsied and found to be positive for carcinoma. She has a history of left breast cancer in 1993 when she underwent a left lumpectomy,  ALND, chemotherapy and radiation. For most recent diagnosis She had neoadjuvant chemotherapy 03/22/20-08/23/20.Pt is now s/p Left breast modified radical mastectomy and right mastectomy with 3+ LN of 6 removed on  10/01/20.  On 10/14/20 she underwent a right ALND. She is presently undergoing radiation.    Currently in Pain? No/denies    Pain Score 0-No pain                             [facility] Adult PT Treatment/Exercise - 01/07/21 0001      Shoulder Exercises: Supine   Other Supine Exercises Supine flex, scaption, Horizontal abd x 5 ea      Manual Therapy    Manual Therapy Manual Lymphatic Drainage (MLD);Passive ROM;Myofascial release    Soft tissue mobilization to bilateral pectoralis muscles/axillary border    Myofascial Release Bilateral axilla during PROM and to bilateral chest regions    Passive ROM In Supine to bilateral shoulder into flexion, abduction, ER and D2 to pts tolerance                       PT Long Term Goals - 12/05/20 1835      PT LONG TERM GOAL #1   Title Patient will demonstrate she has regained full shoulder ROM and function post operatively compared to baselines.    Time 6    Period Weeks    Status New    Target Date 01/16/21      PT LONG TERM GOAL #2   Title pt will be independent in self MLD  to right UE and trunk    Time 4    Period Weeks    Status New    Target Date 01/02/21      PT LONG TERM GOAL #3   Title Pt will have minimal difference in circumference of both arms    Time 6    Period Weeks    Status New    Target Date 01/16/21      PT LONG TERM GOAL #4   Title Pt will understand precautions for skin care and decreasing risk of infection/lymphedema    Time 6    Period Weeks    Status New    Target Date 01/16/21      PT LONG TERM GOAL #5   Title Pt will be independent in HEP for shoulder ROM and strengthening    Time 6                 Plan - 01/07/21 0945    Clinical Impression Statement .Pt continues with mild cording in right axillary region. Pectoral regions tight bilaterally but responded well to soft tissue mobilization.  Pt did very well with supine AROM exs and noted comfortable stretch with exs.  AROM continues to improve.  Lateral trunk swelling is still improved.    Personal Factors and Comorbidities Comorbidity 1;Comorbidity 3+    Comorbidities Breast CA reoccurence, Aids, aortic atherosclerosis    Stability/Clinical Decision Making Stable/Uncomplicated    Rehab Potential Good    PT Frequency 2x / week    PT Duration 6 weeks    PT Treatment/Interventions  ADLs/Self Care Home Management;Therapeutic activities;Therapeutic exercise;Neuromuscular re-education;Manual techniques;Orthotic Fit/Training;Patient/family education;Manual lymph drainage;Compression bandaging;Scar mobilization;Passive range of motion    PT Next Visit Plan Cont and review self MLD to Rt UE (no anterior inter-axillary anastomosis) having pt return demo; cont bilateral shoulder P/ROM/MFR to chest,axilla, Supine flex/scaption Merlyn Lot abd  AROM  progressing to band when ready    PT Home Exercise Plan Post op shoulder ROM HEP, supine flex/scaption with wand, self MLD    Consulted and Agree with Plan of Care Patient           Patient will benefit from skilled therapeutic intervention in order to improve the following deficits and impairments:  Decreased knowledge of precautions,Decreased range of motion,Decreased skin integrity,Decreased scar mobility,Decreased strength,Increased edema,Increased fascial restricitons,Postural dysfunction  Visit Diagnosis: Inflammatory breast cancer, left (HCC)  Localized edema  Abnormal posture  Inflammatory breast cancer, right (HCC)  Malignant neoplasm of upper-outer quadrant of left breast in female, estrogen receptor positive ([place])     Problem List Patient Active Problem List   Diagnosis Date Noted  . Primary malignant neoplasm of upper outer quadrant of right breast ([place]) 10/01/2020  . Drug-induced neutropenia ([place]) 06/28/2020  . Thrombocytopenia ([place]) 06/07/2020  . Port-A-Cath in place 04/19/2020  . Malignant neoplasm of upper-outer quadrant of left breast in female, estrogen receptor negative ([place]) 03/05/2020  . Rash and nonspecific skin eruption 11/08/2013  . History of breast cancer 12/20/2012  . HYPERTENSION, BENIGN ESSENTIAL 08/11/2007  . HIV DISEASE 04/14/2007    Claris Pong 01/07/2021, 12:11 PM  [place], [place], [address] Phone:  [phone]   Fax:  [phone]  Name: Victoria Coffey MRN: [id number] Date of Birth: [birth year] Cheral Almas, PT 01/07/21 12:13 PM

## 2021-01-07 NOTE — Telephone Encounter (Signed)
Oral Chemotherapy Pharmacist Encounter   Spoke with patient today to follow up regarding patient's oral chemotherapy medication: Xeloda (capecitabine)  Spoke with patient regarding capecitabine tablet quantity patient has left on hand. Patient reports having 42 tablets on hand, which will last her through 01/17/21, per MD pts last day of XRT will be 01/23/21.   OK per Dr. Burr Medico to send prescription refill for Xeloda 500mg  tablets, quantity 20 (4 day supply) with directions of: take 3 tablets by mouth in morning and 2 tabs by mouth in evening on days of radiation Monday through Friday. Take within 30 minutes after meals.   Patient knows to call the office with questions or concerns.  Leron Croak, PharmD, BCPS Hematology/Oncology Clinical Pharmacist [facility] [phone] 01/07/2021 1:38 PM

## 2021-01-07 NOTE — Telephone Encounter (Signed)
Oral Chemotherapy Pharmacist Encounter   Attempted to reach patient for follow up on oral medication: Xeloda (capecitabine).   No answer. Left voicemail for patient to call back with any questions or issues.   Leron Croak, PharmD, BCPS Hematology/Oncology Clinical Pharmacist [facility] [phone] 01/07/2021 11:16 AM

## 2021-01-08 ENCOUNTER — Ambulatory Visit: Payer: BC Managed Care – PPO

## 2021-01-08 ENCOUNTER — Telehealth: Payer: Self-pay

## 2021-01-08 ENCOUNTER — Other Ambulatory Visit: Payer: Self-pay

## 2021-01-08 ENCOUNTER — Ambulatory Visit
Admission: RE | Admit: 2021-01-08 | Discharge: 2021-01-08 | Disposition: A | Discharge status: Home / Self Care | Payer: BC Managed Care – PPO | Source: Ambulatory Visit | Attending: Radiation Oncology | Admitting: Radiation Oncology

## 2021-01-08 DIAGNOSIS — C50412 Malignant neoplasm of upper-outer quadrant of left female breast: Secondary | ICD-10-CM | POA: Diagnosis not present

## 2021-01-08 NOTE — Telephone Encounter (Signed)
Pt aware of PET scan appt date and time and instructions.

## 2021-01-09 ENCOUNTER — Ambulatory Visit: Payer: BC Managed Care – PPO

## 2021-01-09 ENCOUNTER — Other Ambulatory Visit: Payer: Self-pay

## 2021-01-09 ENCOUNTER — Ambulatory Visit
Admission: RE | Admit: 2021-01-09 | Discharge: 2021-01-09 | Disposition: A | Discharge status: Home / Self Care | Payer: BC Managed Care – PPO | Source: Ambulatory Visit | Attending: Radiation Oncology | Admitting: Radiation Oncology

## 2021-01-09 DIAGNOSIS — C50412 Malignant neoplasm of upper-outer quadrant of left female breast: Secondary | ICD-10-CM | POA: Diagnosis not present

## 2021-01-10 ENCOUNTER — Ambulatory Visit
Admission: RE | Admit: 2021-01-10 | Discharge: 2021-01-10 | Disposition: A | Discharge status: Home / Self Care | Payer: BC Managed Care – PPO | Source: Ambulatory Visit | Attending: Radiation Oncology | Admitting: Radiation Oncology

## 2021-01-10 ENCOUNTER — Ambulatory Visit: Payer: BC Managed Care – PPO

## 2021-01-10 ENCOUNTER — Other Ambulatory Visit: Payer: Self-pay

## 2021-01-10 ENCOUNTER — Telehealth: Payer: Self-pay | Admitting: Physician Assistant

## 2021-01-10 DIAGNOSIS — C50412 Malignant neoplasm of upper-outer quadrant of left female breast: Secondary | ICD-10-CM | POA: Diagnosis not present

## 2021-01-10 NOTE — Telephone Encounter (Signed)
Third attempt to reach pt.   We received updated FDA guidance that all patients who previously received Tixagevimab150mg /Cilgavimab150mg  should receive another 150mg  dose to equal the new updated dose of 300mg Tixagevimab/300mg  Cilgavimab.    I left a message and sent a mychart message that she will require a second dose to achieve full protection for PEP. I have confirmed that mychart message has not been read. I sent left her a VM again today and will try to text her. If she does not reply to text, we will stop trying to contact her.    Nell Range PA-C

## 2021-01-10 NOTE — Progress Notes (Signed)
[place]   [phone] [phone]   Clinic Follow up Note   Patient Care Team: [name], MD as PCP - General (Infectious Diseases) [name], MD as PCP - Infectious Diseases (Infectious Diseases) [name], MD as Consulting Physician (General Surgery) [name], MD as Consulting Physician (Hematology) [name], MD as Consulting Physician (Radiation Oncology) [name], RN as Oncology Nurse Navigator [name], RN as Oncology Nurse Navigator  Date of Service:  01/13/2021  CHIEF COMPLAINT: F/u ofbilateralbreast cancer  SUMMARY OF ONCOLOGIC HISTORY: Oncology History Overview Note  Cancer Staging Bilateral breast cancer [facility]) Staging form: Breast, AJCC 8th Edition - Pathologic stage from 10/01/2020: Stage IIIC (pT3, pN2a, cM0, G3, ER-, PR-, HER2-) - Signed by Alla Feeling, NP on 10/24/2020  Malignant neoplasm of upper-outer quadrant of left breast in female, estrogen receptor positive ([place]) Staging form: Breast, AJCC 8th Edition - Clinical stage from 03/01/2020: Stage IIIC (cT4, cN0, cM0, G3, ER+, PR-, HER2-) - Signed by [name], MD on 08/22/2020 - Pathologic stage from 10/01/2020: Stage IIIC (pT4b, pN0, cM0, G3, ER-, PR-, HER2-) - Signed by Alla Feeling, NP on 10/24/2020    Malignant neoplasm of upper-outer quadrant of left breast in female, estrogen receptor negative ([place])  02/28/2020 Mammogram   Diagnostic Mammogram 02/28/20  IMPRESSION The 3.9 cm ill defined mass in the left breast posterior depth central 4.3cm to the nipple seen on the mediolateral oblique view onlt with associated difssue skin thickening is highly suspicious for malignancy.    03/01/2020 Cancer Staging   Staging form: Breast, AJCC 8th Edition - Clinical stage from 03/01/2020: Stage IIIC (cT4, cN0, cM0, G3, ER+, PR-, HER2-) - Signed by [name], MD on 08/22/2020   03/01/2020 Initial Biopsy   Diagnosis 03/01/20  1. Breast, left,  needle core biopsy, 9 o'clock, 3-4cmfn - INVASIVE DUCTAL CARCINOMA. SEE NOTE 2. Lymph node, needle/core biopsy, right axilla - INVASIVE DUCTAL CARCINOMA. SEE NOTE Diagnosis Note 1. Invasive carcinoma measures 1.5 cm in greatest linear dimension and appears grade 3. Dr. [name] reviewed the case and concurs with the diagnosis. A breast prognostic profile (ER, PR, Ki-67 and HER2) is pending and will be reported in an addendum. Dr. [name] was notified on 03/04/2020. 2. Carcinoma measures 0.6 cm in greatest linear dimension and appears grade 3. Lymphoid tissue is not identified - this may represent a completely replaced lymph node. A breast prognostic profile (ER, PR, Ki-67 and HER2) is pending and will be reported in an addendum. Dr. [name] reviewed the case and concurs with the diagnosis. Dr. [name] was notified on 03/04/2020.   03/01/2020 Receptors her2   1. PROGNOSTIC INDICATORS Results: IMMUNOHISTOCHEMICAL AND MORPHOMETRIC ANALYSIS PERFORMED MANUALLY The tumor cells are NEGATIVE for Her2 (0). Estrogen Receptor: 20%, POSITIVE, WEAK STAINING INTENSITY Progesterone Receptor: 0%, NEGATIVE Proliferation Marker Ki67: 40% COMMENT: The negative hormone receptor study(ies) in this case has an internal positive control.    2. PROGNOSTIC INDICATORS Results: IMMUNOHISTOCHEMICAL AND MORPHOMETRIC ANALYSIS PERFORMED MANUALLY The tumor cells are NEGATIVE for Her2 (1+). Estrogen Receptor: 10%, POSITIVE, WEAK STAINING INTENSITY Progesterone Receptor: 0%, NEGATIVE Proliferation Marker Ki67: 40% COMMENT: The negative hormone receptor study(ies) in this case has no internal positive control.   03/05/2020 Initial Diagnosis   Malignant neoplasm of upper-outer quadrant of left breast in female, estrogen receptor positive ([place])   03/18/2020 Breast MRI   IMPRESSION: 1. 7 x 9 x 6 cm area of suspicious non masslike enhancement within the central/UPPER RIGHT breast  with anterior skin thickening.  Given biopsy-proven metastatic RIGHT axillary lymph node, tissue sampling of the anterior and posterior aspects of this non masslike RIGHT breast enhancement is recommended to exclude malignancy. 2. 7 x 6 x 5 cm diffuse masslike and nonmasslike enhancement within the majority of the remaining LEFT breast with diffuse LEFT breast skin thickening, compatible with malignancy. Abnormal enhancement is noted along the anterior aspect of the LEFT pectoralis muscle. 3. Three abnormal RIGHT axillary lymph nodes, 1 which is biopsy proven to be metastatic disease. No abnormal LEFT axillary lymph nodes identified.   03/20/2020 Echocardiogram   Baseline ECHO  IMPRESSIONS     1. The patient was reported to be in pain during the study and not able  to participate in this study. All that can be said is that the LV and RV  function are grossly normal. Would recommend to repeat this study when/if  the patient is able to tolerate  the exam.   2. Left ventricular ejection fraction, by estimation, is 60 to 65%. The  left ventricle has normal function. Left ventricular endocardial border  not optimally defined to evaluate regional wall motion. Left ventricular  diastolic function could not be  evaluated.   3. Right ventricular systolic function is normal. The right ventricular  size is normal.   4. The mitral valve was not well visualized. No evidence of mitral valve  regurgitation.   5. The aortic valve was not well visualized. Aortic valve regurgitation  is not visualized.   6. The inferior vena cava is normal in size with greater than 50%  respiratory variability, suggesting right atrial pressure of 3 mmHg.    03/20/2020 Imaging   CT CAP W contrast  IMPRESSION: 1. Small right axillary/subpectoral lymph nodes. Difficult to exclude metastatic disease. 2. Subtle 6 mm low-attenuation lesion in the dome of the liver, not well seen previously. Lesion is too small to characterize. Further evaluation  could be performed with MR abdomen without and with contrast, as clinically indicated. 3. Hepatic steatosis. 4. Aortic atherosclerosis (ICD10-I70.0). Coronary artery calcification.     03/20/2020 Imaging   Whole body bone scan  IMPRESSION: 1. Increased activity noted over the right breast, possibly related to the patient's right breast cancer.   2. Punctate area of increased activity over the left maxillary region, most likely related to sinus disease or dental disease. Increased activity noted over the left mandible also possibly related to dental disease. No other focal bony abnormalities to suggest metastatic disease identified.   03/22/2020 Procedure   PAC placement by Dr [name]    03/22/2020 - 08/23/2020 Chemotherapy   AC q2weeks for 4 cycles 03/22/20-05/03/20 followed by weekly Taxol and Carboplatin q3weeks for 12 weeks starting 05/17/20.                  Week 2 Taxol reduced to half dose and CT was dose reduced starting with week 4 due to thrombocytopenia. Given pancytopenia, will change Carboplatin to 50% dose weekly with Taxol 22m/m2 starting with week 7 (06/13/20). Will hold Carboplatin as needed based on labs. Carboplatin increased to 80% starting with week 8. Carboplatin dose reduced for C11 and held with C12. C12 Taxol postponed to 08/23/20   03/22/2020 Pathology Results   FINAL MICROSCOPIC DIAGNOSIS:   A. BREAST, RIGHT, PUNCH BIOPSY:  - Carcinoma involving dermal lymphatics.  - See comment.   B. BREAST, LEFT, PUNCH BIOPSY:  - Carcinoma involving dermal lymphatics.  - See comment.   COMMENT:  The  morphology is compatible with ductal breast carcinoma.    ADDENDUM:   PROGNOSTIC INDICATOR RESULTS:   Immunohistochemical and morphometric analysis performed manually   The tumor cells are NEGATIVE for Her2 (0%).   Estrogen Receptor:       NEGATIVE, 0%  Progesterone Receptor:   NEGATIVE, 0%    08/13/2020 Breast MRI   IMPRESSION: 1. Interval significant response to  treatment of the diffuse non-mass enhancement involving the UPPER RIGHT breast. There is a solitary residual 5 mm indeterminate mass in the UPPER OUTER QUADRANT at MIDDLE depth with plateau kinetics. 2. Minimal response to treatment of the diffuse non-mass enhancement throughout the LEFT breast. 3. Resolution of enhancement of the skin and nipple-areolar complex of the RIGHT breast. 4. Near complete resolution of enhancement of the skin and nipple-areolar complex of the LEFT breast. Minimal residual nipple-areola complex enhancement persists. 5. The 3 previously identified pathologic RIGHT axillary lymph nodes are now normal in appearance. There is no pathologic lymphadenopathy currently.   08/23/2020 -  Chemotherapy   Immunotherapy Keytruda q3weeks starting 08/23/20 for 1 year treatment.    10/01/2020 Surgery   MASTECTOMIES WITH RIGHT SENTINEL LYMPH NODE BIOPSIES,RIGHT TARGETED RADIOACTIVE SEED LYMPH NODE, LEFT BREAST MODIFIED RADICAL MASTECTOMY  and RADIOACTIVE SEED GUIDED  TARGETED RIGHT SENTINEL LYMPH NODE BIOPSY by Dr [name]     10/01/2020 Pathology Results   FINAL MICROSCOPIC DIAGNOSIS:   A. BREAST, LEFT, MASTECTOMY:  - Invasive ductal carcinoma, 7 cm.  - Anterior and posterior soft tissue margins not involved by tumor.  - Foci of skin involvement by tumor, see comment.  - Nipple involvement by tumor.  - See oncology table and comment.   B. LYMPH NODE, LEFT AXILLARY, CONTENTS:  - One lymph node with no metastatic carcinoma (0/1).   C. BREAST, RIGHT, MASTECTOMY:  - Invasive ductal carcinoma, greater than 5 cm.  - Margins not involved.  - Subareolar breast tissue involved by tumor.  - See oncology table and comment.   D. LYMPH NODE, RIGHT #2, SENTINEL, EXCISION:  - Metastatic carcinoma in one lymph node, 0.5 cm (1/1).   E. LYMPH NODE, RIGHT #1, SENTINEL, EXCISION:  - Metastatic carcinoma in one lymph node, 1.5 cm (1/1).  - Biopsy site and biopsy clip.   F. LYMPH  NODE, RIGHT #3, SENTINEL, EXCISION:  - Metastatic carcinoma in one lymph node, 1.5 cm (1/1).   G. LYMPH NODE, RIGHT #4, SENTINEL, EXCISION:  - One lymph node with no metastatic carcinoma (0/1).   H. LYMPH NODE, RIGHT #5, SENTINEL, EXCISION:  - Metastatic carcinoma in one lymph node, 0.5 cm (1/1).   I. LYMPH NODE, RIGHT #6, SENTINEL, EXCISION:  - One lymph node with no metastatic carcinoma (0/1).     A.  LEFT BREAST PROGNOSTIC INDICATOR RESULTS:   Immunohistochemical and morphometric analysis performed manually   The tumor cells are NEGATIVE for Her2 (0).   Estrogen Receptor:       0%, Negative  Progesterone Receptor:   0%, Negative  Proliferation Marker Ki-67:   80%   Comment: The negative hormone receptor study(ies) in the case has an  internal positive control.   Reference Range Estrogen and Progesterone Receptor       Negative  0%       Positive  >1%   All controls stained appropriately.   C. RIGHT BREAST PROGNOSTIC INDICATOR RESULTS:   Immunohistochemical and morphometric analysis performed manually   The tumor cells are NEGATIVE for Her2 (0).   Estrogen Receptor:  0%, Negative  Progesterone Receptor:   0%, Negative  Proliferation Marker Ki-67:   90%   10/01/2020 Cancer Staging   Staging form: Breast, AJCC 8th Edition - Pathologic stage from 10/01/2020: Stage IIIC (pT4b, pN0, cM0, G3, ER-, PR-, HER2-) - Signed by Alla Feeling, NP on 10/24/2020   10/14/2020 Surgery   RIGHT AXILLARY LYMPH NODE DISSECTION by Dr [name]  -1 /13 positive LN for metastatic carcinoma    11/18/2020 -  Chemotherapy   Adjuvant Xeloda 1500 mg BID days 1-14 q21 days x6 months, starting 11/18/2020 (will reduce to 1500 mg Am/1000 mg Pm M-F during radiation)   12/10/2020 - 01/23/2021 Radiation Therapy   Adjuvant Radiation to right chest wall and axilla with Dr Lisbeth Renshaw on 12/10/20-01/23/21   12/16/2020 Tumor Marker   CA 27.29 at 582.2 on 11/14/20   Ca 27.29 at 1387.9 on 12/16/20    01/13/2021 Progression   PET IMPRESSION: 1. Interval development of bulky hypermetabolic liver metastases involving both hepatic lobes. 2. Hypermetabolic lesion in the posterior left hemithorax. This is obscured by small pleural effusion on CT imaging and could be a pulmonary nodule or pleural nodule. Given superimposition of the pleural fluid, pleural nodule is favored. 3. Tiny subtle soft tissue nodule anterior paramidline left hemithorax with low level FDG uptake raises concern for a second site of pleural disease in the left chest. 4.  Aortic Atherosclerois (ICD10-170.0)      CURRENT THERAPY:  1.Keytruda q3weeks starting 08/23/20 for 1 year treatment.D/c on 01/13/21 given disease recurrence/progression 2.Adjuvant Xeloda1500 mg BID days 1-14 q21 days x6 months, starting 11/18/2020(will reduce to 1500 mg Am/1000 mg Pm M-F during radiation) 3. Adjuvant Radiation toright chest wall and axilla with Dr Lisbeth Renshaw on 12/10/20-01/23/21  INTERVAL HISTORY:  Dekisha Mesmer [place] is here for a follow up. She presents to the clinic alone. She notes her radiation is doing well with mild fatigue. She notes her fatigue is mostly manageable. She notes over there weekend she was able to recover. She denies pain or abdominal issues. She did note her left chest wall pain after surgery is fine. She notes she is overwhelmed by her cancer recurrence. She notes is interested in Genetic testing for her daughter. Her notes her son and daughter live in [place] and notes she has support from her children and her husband. She notes concern for her daughter given her husband has strong family history of cancer. She would like her to undergo genetic testing as well.     REVIEW OF SYSTEMS:   Constitutional: Denies fevers, chills or abnormal weight loss Eyes: Denies blurriness of vision Ears, nose, mouth, throat, and face: Denies mucositis or sore throat Respiratory: Denies cough, dyspnea or wheezes Cardiovascular:  Denies palpitation, chest discomfort or lower extremity swelling Gastrointestinal:  Denies nausea, heartburn or change in bowel habits Skin: Denies abnormal skin rashes Lymphatics: Denies new lymphadenopathy or easy bruising Neurological:Denies numbness, tingling or new weaknesses Behavioral/Psych: Mood is stable, no new changes  All other systems were reviewed with the patient and are negative.  MEDICAL HISTORY:  Past Medical History:  Diagnosis Date  . Aortic atherosclerosis ([place])   . Breast cancer ([place]) dx'd 1993   left  . Fatty liver   . History of left breast cancer   . HIV infection ([place])   . Hypertension   . Port-A-Cath in place     SURGICAL HISTORY: Past Surgical History:  Procedure Laterality Date  . AXILLARY LYMPH NODE DISSECTION Right 10/14/2020   Procedure: left  axilla removal of two drains right axillary lymph node dissection;  Surgeon: [name], MD;  Location: [facility];  Service: General;  Laterality: Right;  PEC BLOCK, RNFA OR PA  . BREAST BIOPSY Bilateral 03/22/2020   Procedure: BILATERAL BREAST PUNCH BIOPSIES;  Surgeon: [name], MD;  Location: [facility];  Service: General;  Laterality: Bilateral;  . BREAST LUMPECTOMY    . CESAREAN SECTION    . left breast lumpectomy  1993  . MASTECTOMY W/ SENTINEL NODE BIOPSY Bilateral 10/01/2020  . MASTECTOMY W/ SENTINEL NODE BIOPSY Bilateral 10/01/2020   Procedure: BILATERAL MASTECTOMIES WITH RIGHT SENTINEL LYMPH NODE BIOPSIES,RIGHT TARGETED RADIOACTIVE SEED LYMPH NODE, LEFT BREAST MODIFIED RADICAL MASTECTOMY ;  Surgeon: [name], MD;  Location: [facility];  Service: General;  Laterality: Bilateral;  PEC BLOCK, RNFA  . PORTACATH PLACEMENT Right 03/22/2020   Procedure: INSERTION PORT-A-CATH WITH ULTRASOUND GUIDANCE;  Surgeon: [name], MD;  Location: [facility];  Service: General;  Laterality: Right;  . RADIOACTIVE SEED GUIDED AXILLARY SENTINEL LYMPH NODE Right 10/01/2020   Procedure: RADIOACTIVE SEED GUIDED  TARGETED RIGHT  SENTINEL LYMPH NODE BIOPSY;  Surgeon: [name], MD;  Location: [facility];  Service: General;  Laterality: Right;  PEC BLOCK, RNFA    I have reviewed the social history and family history with the patient and they are unchanged from previous note.  ALLERGIES:  is allergic to codeine, other, grapefruit bioflavonoid complex, pomegranate [punica], and shellfish allergy.  MEDICATIONS:  Current Outpatient Medications  Medication Sig Dispense Refill  . acetaminophen (TYLENOL) 500 MG tablet Take 1,000 mg by mouth every 6 (six) hours as needed for moderate pain or headache.     . capecitabine (XELODA) 500 MG tablet Take 3 tabs in morning and 2 tabs in evening, on days of radiation, Monday through Friday. Take within 30 minutes after meals. 20 tablet 0  . emtricitabine-rilpivir-tenofovir AF (ODEFSEY) 200-25-25 MG TABS tablet Take 1 tablet by mouth daily with breakfast. 30 tablet 11  . hydrochlorothiazide (HYDRODIURIL) 25 MG tablet TAKE 1 TABLET(25 MG) BY MOUTH DAILY 30 tablet 5  . lidocaine-prilocaine (EMLA) cream Apply 1 application topically See admin instructions. Apply 1-2 hours prior to access    . loratadine (CLARITIN) 10 MG tablet Take 10 mg by mouth See admin instructions. Takes 3-4 days before and after treatment    . potassium chloride SA (KLOR-CON) 20 MEQ tablet Take 1 tablet (20 mEq total) by mouth daily. 30 tablet 0  . traMADol (ULTRAM) 50 MG tablet Take 1 tablet (50 mg total) by mouth every 6 (six) hours as needed (mild pain). 30 tablet 1   No current facility-administered medications for this visit.    PHYSICAL EXAMINATION: ECOG PERFORMANCE STATUS: 1 - Symptomatic but completely ambulatory  Vitals:   01/13/21 0954  BP: 129/88  Pulse: 87  Resp: 16  Temp: (!) 97.2 F (36.2 C)  SpO2: 100%   Filed Weights   01/13/21 0954  Weight: 124 lb 8 oz (56.5 kg)    GENERAL:alert, no distress and comfortable SKIN: skin color, texture, turgor are normal, no rashes or significant  lesions EYES: normal, Conjunctiva are pink and non-injected, sclera clear  NECK: supple, thyroid normal size, non-tender, without nodularity LYMPH:  no palpable lymphadenopathy in the cervical, axillary  LUNGS: clear to auscultation and percussion with normal breathing effort HEART: regular rate & rhythm and no murmurs and no lower extremity edema ABDOMEN:abdomen soft, non-tender and normal bowel sounds Musculoskeletal:no cyanosis of digits and no clubbing  NEURO: alert &  oriented x 3 with fluent speech, no focal motor/sensory deficits BREAST: s/p b/l mastectomy with breasts surgically absent (+) Surgical incision healed well (+) Skin hyperpigmentation, no skin breakdown. No palpable mass, nodules or adenopathy bilaterally. Breast exam benign.   LABORATORY DATA:  I have reviewed the data as listed CBC Latest Ref Rng & Units 01/13/2021 01/06/2021 12/30/2020  WBC 4.0 - 10.5 K/uL 3.6(L) 3.9(L) 4.4  Hemoglobin 12.0 - 15.0 g/dL 11.2(L) 11.1(L) 11.2(L)  Hematocrit 36.0 - 46.0 % 32.7(L) 32.5(L) 32.6(L)  Platelets 150 - 400 K/uL 187 205 218     CMP Latest Ref Rng & Units 01/13/2021 01/06/2021 12/30/2020  Glucose 70 - 99 mg/dL 100(H) 99 104(H)  BUN 8 - 23 mg/dL _0 Creatinine 0.44 - 1.00 mg/dL 0.91 0.96 0.97  Sodium 135 - 145 mmol/L 136 135 132(L)  Potassium 3.5 - 5.1 mmol/L 3.6 4.4 4.2  Chloride 98 - 111 mmol/L 104 105 102  CO2 22 - 32 mmol/L 22 22 20(L)  Calcium 8.9 - 10.3 mg/dL 9.1 9.1 8.9  Total Protein 6.5 - 8.1 g/dL 7.4 7.6 7.7  Total Bilirubin 0.3 - 1.2 mg/dL 0.5 0.4 0.4  Alkaline Phos 38 - 126 U/L 640(H) 531(H) 506(H)  AST 15 - 41 U/L 113(H) 86(H) 74(H)  ALT 0 - 44 U/L 85(H) 69(H) 56(H)      RADIOGRAPHIC STUDIES: I have personally reviewed the radiological images as listed and agreed with the findings in the report. NM PET Image Restage (PS) Skull Base to Thigh  Result Date: 01/13/2021 CLINICAL DATA:  Subsequent treatment strategy for breast cancer. EXAM: NUCLEAR MEDICINE PET  SKULL BASE TO THIGH TECHNIQUE: 6.3 mCi F-18 FDG was injected intravenously. Full-ring PET imaging was performed from the skull base to thigh after the radiotracer. CT data was obtained and used for attenuation correction and anatomic localization. Fasting blood glucose: 115 mg/dl COMPARISON:  Abdominal MRI 04/17/2020. Chest on pelvis CT 03/20/2020. FINDINGS: Mediastinal blood pool activity: SUV max 2.6 Liver activity: SUV max NA NECK: No hypermetabolic lymph nodes in the neck.Focal FDG accumulation identified in the region of the posterior right upper molar. Dental carie or. Cul abscess would be a consideration. Incidental CT findings: none CHEST: Focal hypermetabolic lesion identified posterior pleural left hemithorax with SUV max = 7.8. There is a small left pleural effusion in the hypermetabolic focus is within the pleural fluid, likely obscuring underlying nodule. Tiny foci of low level FDG accumulation are identified along the anterior left chest wall just lateral to the internal mammary chain where a small subtle 5 mm pleural nodule is seen on image 60/series 4. No other hypermetabolic lymphadenopathy in the chest. Incidental CT findings: Surgical clips are noted in the axillary regions bilaterally. Right Port-A-Cath tip is positioned at the SVC/RA junction. Coronary artery calcification is evident. Subtle pleural nodularity is seen along the left major fissure. ABDOMEN/PELVIS: Interval development of multiple bulky liver lesions. These are hypermetabolic consistent with metastatic disease. 2.7 cm lesion in the dome of the right liver demonstrates SUV max = 12.1. Posterior right hepatic lesion measuring approximately 3.8 cm on CT image 86 of series 4 demonstrates SUV max = 13.8. 2.6 cm lesion in the lateral segment left liver on 92/4 has SUV max = 13.5. No hypermetabolic abdominal lymphadenopathy. Incidental CT findings: No hydronephrosis in either kidney. There is abdominal aortic atherosclerosis without  aneurysm. SKELETON: Incidental CT findings: none IMPRESSION: 1. Interval development of bulky hypermetabolic liver metastases involving both hepatic lobes. 2. Hypermetabolic lesion in  the posterior left hemithorax. This is obscured by small pleural effusion on CT imaging and could be a pulmonary nodule or pleural nodule. Given superimposition of the pleural fluid, pleural nodule is favored. 3. Tiny subtle soft tissue nodule anterior paramidline left hemithorax with low level FDG uptake raises concern for a second site of pleural disease in the left chest. 4.  Aortic Atherosclerois (ICD10-170.0) Electronically Signed   By: Misty Stanley M.D.   On: 01/13/2021 09:17     ASSESSMENT & PLAN:  PAISLY FINGERHUT is a 65 y.o. female with    1.InflammatoryLeft breast cancer,pT4N0Mx,ypT4N0,triple negativeAND Right breast cancer withaxillary node metastasis,pT3N2aM0,ERweakly+,PR/HER2-,ypT3N2a, triple negative,GradeIII, diffuse liver metastasis in 12/2020 -She was diagnosed with left breast cancer in 02/2020.She presented with inflammatory left breast cancer. 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 -Further work up showed 9cm nodular area in right breast and skin biopsy confirmed cancer involvement -Staging scans in May 2021 were negative for distant metastasis.  -She completed AC X4 and weekly TC 03/22/20-08/23/20.  -Given recentpublish [id number] trial data,I started her on Immunotherapy Keytruda q3weeks for 1 year of treatment beginning 08/23/20. -She underwent b/l mastectomy with Dr [name] on 10/01/20.Shealsounderwent right axillary lymph node dissection on 10/14/2020. -Due tohersignificantresidual diseasein LNs,Istarted her onadjuvant Xeloda1500 mg twice daily, 2 weeks on and one week off(1517m am, 10010mpm M-F when she is on radiation) for6 monthson 1/17/22to reduce her high risk of recurrence after surgery. -She  startedadjuvant radiation to right chest wall and axillaon 12/10/20-01/23/21 with Dr MoLisbeth Renshaw-I personally reviewed and discussed her PET from today with pt which showed Interval development of bulky hypermetabolic liver metastases (largest 2.7cm) involving both hepatic lobes. There is also 83m59mypermetabolic lesion in the posterior left hemithorax. I personally reviewed images with patient today.  -I recommend liver biopsy for definitive diagnosis given she had b/l breast cancers. She is agreeable. I discussed using her biopsy sample for molecular testing such as [facility] to see if she is a candidate for targeted therapy. She is also agreeable.  -I discussed if her cancer is confirmed stage IV with liver mets, her cancer is no longer curable but still treatable. I discussed her cancer does present more aggressive than average disease. I discussed the overall poor prognosis.  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 I will stop her KeyBeryle Flockd she can continue Xeloda as long as she is on Radiation.  -F/u next week on 3/21  2.Cytopenias -secondary to chemo -Mild and stable anemia and intermittent leukopenia.Continue monitoring labs weekly -Labs today show Hg 11.2, WBC 3.6 (01/13/21)   3. H/o Left breast cancer, Dx in 1993s/p lumpectomy, radiation, chemo, Tamoxifen.Genetic Testing -Given her multiple breast cancers, I recommend genetic testing. She is agreeable.   4. Comorbidities: HIV(+)Dx 1993, HTN -Her HIV has been undetectable and well controlled. She is currently being seen by ID Dr SnyGraylon Good previously referredher for Evusheld injection for COVID prevention  5.Mild transaminitis, secondary to chemo -We will monitor   PLAN: -PET scan reviewed, shows diffuse liver metastasis  -Send urgent genetic referral  -IR Liver  biopsy in 1-2 weeks.  -d/c Keytruda  -Continue CCRT withXeloda 1500m57m and 1000mg383mMondays through Fridays for now, plan to complete late next week -F/u on 3/21  -I offered counseling with SW. She declined  today  -Her husband called me after her visit today and I called back and updated him the discussion today    No problem-specific Assessment & Plan notes found for this encounter.   Orders Placed This Encounter  Procedures  . [place] CORE BIOPSY (LIVER)    Standing Status:   Future    Standing Expiration Date:   01/13/2022    Order Specific Question:   Lab orders requested (DO NOT place separate lab orders, these will be automatically ordered during procedure specimen collection):    Answer:   Surgical Pathology    Order Specific Question:   Reason for Exam (SYMPTOM  OR DIAGNOSIS REQUIRED)    Answer:   also need tissue for FO    Order Specific Question:   Preferred location?    Answer:   [facility]  . Ambulatory referral to Genetics    Referral Priority:   Urgent    Referral Type:   Consultation    Referral Reason:   Specialty Services Required    Number of Visits Requested:   1   All questions were answered. The patient knows to call the clinic with any problems, questions or concerns. No barriers to learning was detected. The total time spent in the appointment was 40 minutes.     [name], MD 01/13/2021   I, Joslyn Devon, am acting as scribe for [name], MD.   I have reviewed the above documentation for accuracy and completeness, and I agree with the above.

## 2021-01-13 ENCOUNTER — Ambulatory Visit: Payer: BC Managed Care – PPO

## 2021-01-13 ENCOUNTER — Inpatient Hospital Stay (HOSPITAL_BASED_OUTPATIENT_CLINIC_OR_DEPARTMENT_OTHER): Payer: BC Managed Care – PPO | Admitting: Hematology

## 2021-01-13 ENCOUNTER — Encounter (HOSPITAL_COMMUNITY): Payer: Self-pay

## 2021-01-13 ENCOUNTER — Other Ambulatory Visit: Payer: Self-pay

## 2021-01-13 ENCOUNTER — Inpatient Hospital Stay: Payer: BC Managed Care – PPO

## 2021-01-13 ENCOUNTER — Ambulatory Visit
Admission: RE | Admit: 2021-01-13 | Discharge: 2021-01-13 | Disposition: A | Discharge status: Home / Self Care | Payer: BC Managed Care – PPO | Source: Ambulatory Visit | Attending: Radiation Oncology | Admitting: Radiation Oncology

## 2021-01-13 ENCOUNTER — Telehealth: Payer: Self-pay | Admitting: Genetic Counselor

## 2021-01-13 ENCOUNTER — Ambulatory Visit (HOSPITAL_COMMUNITY)
Admission: RE | Admit: 2021-01-13 | Discharge: 2021-01-13 | Disposition: A | Discharge status: Home / Self Care | Payer: BC Managed Care – PPO | Source: Ambulatory Visit | Attending: Hematology | Admitting: Hematology

## 2021-01-13 ENCOUNTER — Encounter: Payer: Self-pay | Admitting: Hematology

## 2021-01-13 VITALS — BP 129/88 | HR 87 | Temp 97.2°F | Resp 16 | Ht 61.0 in | Wt 124.5 lb

## 2021-01-13 DIAGNOSIS — C50412 Malignant neoplasm of upper-outer quadrant of left female breast: Secondary | ICD-10-CM | POA: Diagnosis not present

## 2021-01-13 DIAGNOSIS — Z5112 Encounter for antineoplastic immunotherapy: Secondary | ICD-10-CM | POA: Diagnosis not present

## 2021-01-13 DIAGNOSIS — Z95828 Presence of other vascular implants and grafts: Secondary | ICD-10-CM

## 2021-01-13 DIAGNOSIS — Z170 Estrogen receptor positive status [ER+]: Secondary | ICD-10-CM

## 2021-01-13 DIAGNOSIS — C50411 Malignant neoplasm of upper-outer quadrant of right female breast: Secondary | ICD-10-CM

## 2021-01-13 LAB — CBC WITH DIFFERENTIAL (CANCER CENTER ONLY)
Abs Immature Granulocytes: 0.02 10*3/uL (ref 0.00–0.07)
Basophils Absolute: 0 10*3/uL (ref 0.0–0.1)
Basophils Relative: 0 %
Eosinophils Absolute: 0 10*3/uL (ref 0.0–0.5)
Eosinophils Relative: 0 %
HCT: 32.7 % — ABNORMAL LOW (ref 36.0–46.0)
Hemoglobin: 11.2 g/dL — ABNORMAL LOW (ref 12.0–15.0)
Immature Granulocytes: 1 %
Lymphocytes Relative: 9 %
Lymphs Abs: 0.3 10*3/uL — ABNORMAL LOW (ref 0.7–4.0)
MCH: 30.4 pg (ref 26.0–34.0)
MCHC: 34.3 g/dL (ref 30.0–36.0)
MCV: 88.9 fL (ref 80.0–100.0)
Monocytes Absolute: 0.4 10*3/uL (ref 0.1–1.0)
Monocytes Relative: 10 %
Neutro Abs: 2.9 10*3/uL (ref 1.7–7.7)
Neutrophils Relative %: 80 %
Platelet Count: 187 10*3/uL (ref 150–400)
RBC: 3.68 MIL/uL — ABNORMAL LOW (ref 3.87–5.11)
RDW: 19.9 % — ABNORMAL HIGH (ref 11.5–15.5)
WBC Count: 3.6 10*3/uL — ABNORMAL LOW (ref 4.0–10.5)
nRBC: 0 % (ref 0.0–0.2)

## 2021-01-13 LAB — CMP (CANCER CENTER ONLY)
ALT: 85 U/L — ABNORMAL HIGH (ref 0–44)
AST: 113 U/L — ABNORMAL HIGH (ref 15–41)
Albumin: 3.2 g/dL — ABNORMAL LOW (ref 3.5–5.0)
Alkaline Phosphatase: 640 U/L — ABNORMAL HIGH (ref 38–126)
Anion gap: 10 (ref 5–15)
BUN: 13 mg/dL (ref 8–23)
CO2: 22 mmol/L (ref 22–32)
Calcium: 9.1 mg/dL (ref 8.9–10.3)
Chloride: 104 mmol/L (ref 98–111)
Creatinine: 0.91 mg/dL (ref 0.44–1.00)
GFR, Estimated: >60 mL/min (ref >60)
Glucose, Bld: 100 mg/dL — ABNORMAL HIGH (ref 70–99)
Potassium: 3.6 mmol/L (ref 3.5–5.1)
Sodium: 136 mmol/L (ref 135–145)
Total Bilirubin: 0.5 mg/dL (ref 0.3–1.2)
Total Protein: 7.4 g/dL (ref 6.5–8.1)

## 2021-01-13 LAB — GLUCOSE, CAPILLARY: Glucose-Capillary: 115 mg/dL — ABNORMAL HIGH (ref 70–99)

## 2021-01-13 MED ORDER — FLUDEOXYGLUCOSE F - 18 (FDG) INJECTION
7.0000 | Freq: Once | INTRAVENOUS | Status: AC | PRN
  Administered 2021-01-13: 6.3 via INTRAVENOUS

## 2021-01-13 MED ORDER — SODIUM CHLORIDE 0.9% FLUSH
10.0000 mL | Freq: Once | INTRAVENOUS | Status: AC
  Administered 2021-01-13: 10 mL
  Filled 2021-01-13: qty 10

## 2021-01-13 MED ORDER — HEPARIN SOD (PORK) LOCK FLUSH 100 UNIT/ML IV SOLN
500.0000 [IU] | Freq: Once | INTRAVENOUS | Status: AC
  Administered 2021-01-13: 500 [IU]
  Filled 2021-01-13: qty 5

## 2021-01-13 NOTE — Progress Notes (Unsigned)
NG    [place] Female, 65 y.o., [birth year]  MRN:  [id number] Phone:  [phone] Jerilynn Mages)       PCP:  [name], MD Coverage:  [place] [employer] Comm Ppo  Next Appt With Radiology ([facility]-US 2) 01/15/2021 at 1:00 PM           FW: biopsy approval Received: Today  Message Details  [place] Reap  Previous Messages  ----- Message -----  From: Arne Cleveland, MD  Sent: 01/13/2021  1:50 PM EDT  To: Suzette Battiest, MD, Jillyn Hidden, *  Subject: biopsy approval                  OK to sched . . .   [place] core liver lesion biopsy   [facility]     ----- Message -----  From: [name], MD  Sent: 01/13/2021  1:33 PM EDT  To: Arne Cleveland, MD, Suzette Battiest, MD, *   Drs. Hassell and [place],   When you have a few minutes, could you review her recent PET scan and approve her US liver biopsy?   My nurse, please help to schedule her biopsy ASAP when it's approved, thanks   Santiago Glad and Raquel Sarna, I sent an urgent genetic referral today, please see her this week and do genetic testing. She now has metastatic breast cancer.   Thanks   Krista Blue

## 2021-01-13 NOTE — Telephone Encounter (Signed)
Scheduled per 03/14 scheduled message, patient has been called and notified regarding 03/15 genetics appointment.

## 2021-01-14 ENCOUNTER — Other Ambulatory Visit: Payer: Self-pay | Admitting: Student

## 2021-01-14 ENCOUNTER — Other Ambulatory Visit: Payer: Self-pay | Admitting: Genetic Counselor

## 2021-01-14 ENCOUNTER — Ambulatory Visit: Payer: BC Managed Care – PPO

## 2021-01-14 ENCOUNTER — Encounter: Payer: Self-pay | Admitting: Genetic Counselor

## 2021-01-14 ENCOUNTER — Ambulatory Visit
Admission: RE | Admit: 2021-01-14 | Discharge: 2021-01-14 | Disposition: A | Discharge status: Home / Self Care | Payer: BC Managed Care – PPO | Source: Ambulatory Visit | Attending: Radiation Oncology | Admitting: Radiation Oncology

## 2021-01-14 ENCOUNTER — Inpatient Hospital Stay: Payer: BC Managed Care – PPO

## 2021-01-14 ENCOUNTER — Telehealth: Payer: Self-pay | Admitting: Hematology

## 2021-01-14 ENCOUNTER — Inpatient Hospital Stay (HOSPITAL_BASED_OUTPATIENT_CLINIC_OR_DEPARTMENT_OTHER): Payer: BC Managed Care – PPO | Admitting: Genetic Counselor

## 2021-01-14 ENCOUNTER — Telehealth: Payer: Self-pay

## 2021-01-14 ENCOUNTER — Other Ambulatory Visit: Payer: Self-pay

## 2021-01-14 DIAGNOSIS — Z170 Estrogen receptor positive status [ER+]: Secondary | ICD-10-CM

## 2021-01-14 DIAGNOSIS — C50411 Malignant neoplasm of upper-outer quadrant of right female breast: Secondary | ICD-10-CM

## 2021-01-14 DIAGNOSIS — C50412 Malignant neoplasm of upper-outer quadrant of left female breast: Secondary | ICD-10-CM

## 2021-01-14 DIAGNOSIS — Z853 Personal history of malignant neoplasm of breast: Secondary | ICD-10-CM | POA: Diagnosis not present

## 2021-01-14 DIAGNOSIS — Z95828 Presence of other vascular implants and grafts: Secondary | ICD-10-CM

## 2021-01-14 DIAGNOSIS — C50912 Malignant neoplasm of unspecified site of left female breast: Secondary | ICD-10-CM

## 2021-01-14 DIAGNOSIS — Z5112 Encounter for antineoplastic immunotherapy: Secondary | ICD-10-CM | POA: Diagnosis not present

## 2021-01-14 DIAGNOSIS — R293 Abnormal posture: Secondary | ICD-10-CM

## 2021-01-14 DIAGNOSIS — C50911 Malignant neoplasm of unspecified site of right female breast: Secondary | ICD-10-CM

## 2021-01-14 DIAGNOSIS — R600 Localized edema: Secondary | ICD-10-CM

## 2021-01-14 LAB — CANCER ANTIGEN 27.29: CA 27.29: 3555.1 U/mL — ABNORMAL HIGH (ref 0.0–38.6)

## 2021-01-14 MED ORDER — SODIUM CHLORIDE 0.9% FLUSH
10.0000 mL | Freq: Once | INTRAVENOUS | Status: AC
  Administered 2021-01-14: 10 mL
  Filled 2021-01-14: qty 10

## 2021-01-14 MED ORDER — HEPARIN SOD (PORK) LOCK FLUSH 100 UNIT/ML IV SOLN
500.0000 [IU] | Freq: Once | INTRAVENOUS | Status: AC
  Administered 2021-01-14: 500 [IU]
  Filled 2021-01-14: qty 5

## 2021-01-14 NOTE — Therapy (Signed)
[place], [place], [address] Phone: [phone]   Fax:  [phone]  Physical Therapy Treatment  Patient Details  Name: Victoria Coffey MRN: [id number] Date of Birth: 10-03-1956 Referring Provider (PT): Dr Burr Medico   Encounter Date: 01/14/2021   PT End of Session - 01/14/21 0916    Visit Number 8    Number of Visits 13    Date for PT Re-Evaluation 01/16/21    PT Start Time 0906    PT Stop Time 0958    PT Time Calculation (min) 52 min    Activity Tolerance Patient tolerated treatment well    Behavior During Therapy [facility] for tasks assessed/performed           Past Medical History:  Diagnosis Date  . Aortic atherosclerosis ([place])   . Breast cancer ([place]) dx'd 1993   left  . Fatty liver   . History of left breast cancer   . HIV infection ([place])   . Hypertension   . Port-A-Cath in place     Past Surgical History:  Procedure Laterality Date  . AXILLARY LYMPH NODE DISSECTION Right 10/14/2020   Procedure: left axilla removal of two drains right axillary lymph node dissection;  Surgeon: [name], MD;  Location: [facility];  Service: General;  Laterality: Right;  PEC BLOCK, RNFA OR PA  . BREAST BIOPSY Bilateral 03/22/2020   Procedure: BILATERAL BREAST PUNCH BIOPSIES;  Surgeon: [name], MD;  Location: [facility];  Service: General;  Laterality: Bilateral;  . BREAST LUMPECTOMY    . CESAREAN SECTION    . left breast lumpectomy  1993  . MASTECTOMY W/ SENTINEL NODE BIOPSY Bilateral 10/01/2020  . MASTECTOMY W/ SENTINEL NODE BIOPSY Bilateral 10/01/2020   Procedure: BILATERAL MASTECTOMIES WITH RIGHT SENTINEL LYMPH NODE BIOPSIES,RIGHT TARGETED RADIOACTIVE SEED LYMPH NODE, LEFT BREAST MODIFIED RADICAL MASTECTOMY ;  Surgeon: [name], MD;  Location: [facility];  Service: General;  Laterality: Bilateral;  PEC BLOCK, RNFA  . PORTACATH PLACEMENT Right 03/22/2020   Procedure: INSERTION PORT-A-CATH WITH ULTRASOUND  GUIDANCE;  Surgeon: [name], MD;  Location: [facility];  Service: General;  Laterality: Right;  . RADIOACTIVE SEED GUIDED AXILLARY SENTINEL LYMPH NODE Right 10/01/2020   Procedure: RADIOACTIVE SEED GUIDED  TARGETED RIGHT SENTINEL LYMPH NODE BIOPSY;  Surgeon: [name], MD;  Location: [facility];  Service: General;  Laterality: Right;  PEC BLOCK, RNFA    There were no vitals filed for this visit.   Subjective Assessment - 01/14/21 0907    Subjective I called about my sleeve and it is on back order. Arms are doing well.  Chest feels tight sometimes.  I have a liver biopsy tomorrow to see if cancer has spread.    Pertinent History Patient was diagnosed on 02/28/2020 with left invasive ductal carcinoma breast cancer. The mass measured 3.9 cm and was located in the upper outer quadrant. It was ER positive, PR negative, and HER2 negative with a Ki67 of 40%. Her left breast appeared to be retracted with significant skin changes. She also had 3 abnormal appearing nodes in the RIGHT axilla and 1 was biopsied and found to be positive for carcinoma. She has a history of left breast cancer in 1993 when she underwent a left lumpectomy, ALND, chemotherapy and radiation. For most recent diagnosis She had neoadjuvant chemotherapy 03/22/20-08/23/20.Pt is now s/p Left breast modified radical mastectomy and right mastectomy with 3+ LN of 6 removed on  10/01/20.  On 10/14/20 she underwent a  right ALND. She is presently undergoing radiation.                             [place] Adult PT Treatment/Exercise - 01/14/21 0001      Shoulder Exercises: Supine   Horizontal ABduction Strengthening;Both;10 reps    Theraband Level (Shoulder Horizontal ABduction) Level 1 (Yellow)    Other Supine Exercises Supine flex, scaption, Horizontal abd x 5 ea      Manual Therapy   Soft tissue mobilization to bilateral pectoralis muscles/axillary border    Myofascial Release Bilateral axilla during PROM and to bilateral  chest regions    Passive ROM In Supine to bilateral shoulder into flexion, abduction, ER and D2 to pts tolerance                       PT Long Term Goals - 12/05/20 1835      PT LONG TERM GOAL #1   Title Patient will demonstrate she has regained full shoulder ROM and function post operatively compared to baselines.    Time 6    Period Weeks    Status New    Target Date 01/16/21      PT LONG TERM GOAL #2   Title pt will be independent in self MLD  to right UE and trunk    Time 4    Period Weeks    Status New    Target Date 01/02/21      PT LONG TERM GOAL #3   Title Pt will have minimal difference in circumference of both arms    Time 6    Period Weeks    Status New    Target Date 01/16/21      PT LONG TERM GOAL #4   Title Pt will understand precautions for skin care and decreasing risk of infection/lymphedema    Time 6    Period Weeks    Status New    Target Date 01/16/21      PT LONG TERM GOAL #5   Title Pt will be independent in HEP for shoulder ROM and strengthening    Time 6                 Plan - 01/14/21 0917    Clinical Impression Statement Pt. has not yet received her second sleeve and was told it was on back order.  Cording continues in right axillary region which is less prominent.  AROM continues to improve as does trunk swelling. Pt is undergoing a liver biopsy tomorrow and had to receive several calls during treatment so treatment time decreased slightly    Personal Factors and Comorbidities Comorbidity 1;Comorbidity 3+    Comorbidities Breast CA reoccurence, Aids, aortic atherosclerosis    Stability/Clinical Decision Making Stable/Uncomplicated    Rehab Potential Good    PT Frequency 2x / week    PT Duration 6 weeks    PT Treatment/Interventions ADLs/Self Care Home Management;Therapeutic activities;Therapeutic exercise;Neuromuscular re-education;Manual techniques;Orthotic Fit/Training;Patient/family education;Manual lymph  drainage;Compression bandaging;Scar mobilization;Passive range of motion    PT Next Visit Plan recert next,Cont and review self MLD to Rt UE (no anterior inter-axillary anastomosis) having pt return demo; cont bilateral shoulder P/ROM/MFR to chest,axilla, Supine flex/scaption /Horizontal abd AROM  progressing to band when ready    PT Home Exercise Plan Post op shoulder ROM HEP, supine flex/scaption with wand, self MLD    Consulted and Agree with Plan of Care Patient  Patient will benefit from skilled therapeutic intervention in order to improve the following deficits and impairments:  Decreased knowledge of precautions,Decreased range of motion,Decreased skin integrity,Decreased scar mobility,Decreased strength,Increased edema,Increased fascial restricitons,Postural dysfunction  Visit Diagnosis: Localized edema  Abnormal posture  Inflammatory breast cancer, right (HCC)  Malignant neoplasm of upper-outer quadrant of left breast in female, estrogen receptor positive ([place])  Inflammatory breast cancer, left [facility])     Problem List Patient Active Problem List   Diagnosis Date Noted  . Primary malignant neoplasm of upper outer quadrant of right breast ([place]) 10/01/2020  . Drug-induced neutropenia ([place]) 06/28/2020  . Thrombocytopenia ([place]) 06/07/2020  . Port-A-Cath in place 04/19/2020  . Malignant neoplasm of upper-outer quadrant of left breast in female, estrogen receptor negative ([place]) 03/05/2020  . Rash and nonspecific skin eruption 11/08/2013  . History of breast cancer 12/20/2012  . HYPERTENSION, BENIGN ESSENTIAL 08/11/2007  . HIV DISEASE 04/14/2007    Claris Pong 01/14/2021, 10:01 AM  [place], [place], [address] Phone: [phone]   Fax:  [phone]  Name: Victoria Coffey MRN: [id number] Date of Birth: May 26, 1956 Cheral Almas, PT 01/14/21 10:02 AM

## 2021-01-14 NOTE — Telephone Encounter (Signed)
I spoke with Victoria Coffey in [place] asking her to cancel tomorrows rad treatment and adding it to the end.  She said she would take care to this.

## 2021-01-14 NOTE — Telephone Encounter (Signed)
Checked out appointment. No LOS notes needing to be scheduled. No changes made. 

## 2021-01-14 NOTE — Progress Notes (Signed)
REFERRING PROVIDER: Malachy Mood, MD [address] [place],  [place] [address]  PRIMARY PROVIDER:  Judyann Munson, MD  PRIMARY REASON FOR VISIT:  1. Malignant neoplasm of upper-outer quadrant of left breast in female, estrogen receptor negative (HCC)   2. 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 History of breast cancer       HISTORY OF PRESENT ILLNESS:   Victoria Coffey, a 65 y.o. female, was seen for a [place] cancer genetics consultation at the request of Dr. Mosetta Putt due to a personal history of breast cancer.  Victoria Coffey presents to clinic today to discuss the possibility of a hereditary predisposition to cancer, genetic testing, and to further clarify her future cancer risks, as well as potential cancer risks for family members.   In 1993, at the age of 71, Victoria Coffey was diagnosed with breast cancer of the left breast. The treatment plan included lumpectomy, chemotherapy, and tamoxifen.  More recently, in April of 2021, she was diagnosed with invasive ductal carcinoma of both breasts. She underwent bilateral mastectomies on 10/01/2020. A PET scan on 01/13/2021 showed liver metastases, and she plans to undergo liver biopsy on 01/15/2021.   CANCER HISTORY:  Oncology History Overview Note  Cancer Staging Bilateral breast cancer [facility]) Staging form: Breast, AJCC 8th Edition - Pathologic stage from 10/01/2020: Stage IIIC (pT3, pN2a, cM0, G3, ER-, PR-, HER2-) - Signed by Pollyann Samples, NP on 10/24/2020  Malignant neoplasm of upper-outer quadrant of left breast in female, estrogen receptor positive (HCC) Staging form: Breast, AJCC 8th Edition - Clinical stage from 03/01/2020: Stage IIIC (cT4, cN0, cM0, G3, ER+, PR-, HER2-) - Signed by Malachy Mood, MD on 08/22/2020 - Pathologic stage from 10/01/2020: Stage IIIC (pT4b, pN0, cM0, G3, ER-, PR-, HER2-) - Signed by Pollyann Samples, NP on 10/24/2020    Malignant neoplasm of upper-outer quadrant of left  breast in female, estrogen receptor negative (HCC)  02/28/2020 Mammogram   Diagnostic Mammogram 02/28/20  IMPRESSION The 3.9 cm ill defined mass in the left breast posterior depth central 4.3cm to the nipple seen on the mediolateral oblique view onlt with associated difssue skin thickening is highly suspicious for malignancy.    03/01/2020 Cancer Staging   Staging form: Breast, AJCC 8th Edition - Clinical stage from 03/01/2020: Stage IIIC (cT4, cN0, cM0, G3, ER+, PR-, HER2-) - Signed by Malachy Mood, MD on 08/22/2020   03/01/2020 Initial Biopsy   Diagnosis 03/01/20  1. Breast, left, needle core biopsy, 9 o'clock, 3-4cmfn - INVASIVE DUCTAL CARCINOMA. SEE NOTE 2. Lymph node, needle/core biopsy, right axilla - INVASIVE DUCTAL CARCINOMA. SEE NOTE Diagnosis Note 1. Invasive carcinoma measures 1.5 cm in greatest linear dimension and appears grade 3. Dr. Luisa Hart reviewed the case and concurs with the diagnosis. A breast prognostic profile (ER, PR, Ki-67 and HER2) is pending and will be reported in an addendum. Dr. Juanetta Gosling was notified on 03/04/2020. 2. Carcinoma measures 0.6 cm in greatest linear dimension and appears grade 3. Lymphoid tissue is not identified - this may represent a completely replaced lymph node. A breast prognostic profile (ER, PR, Ki-67 and HER2) is pending and will be reported in an addendum. Dr. Luisa Hart reviewed the case and concurs with the diagnosis. Dr. Juanetta Gosling was notified on 03/04/2020.   03/01/2020 Receptors her2   1. PROGNOSTIC INDICATORS Results: IMMUNOHISTOCHEMICAL AND MORPHOMETRIC ANALYSIS PERFORMED MANUALLY The tumor cells are NEGATIVE for Her2 (0). Estrogen Receptor: 20%, POSITIVE, WEAK STAINING INTENSITY Progesterone Receptor: 0%,  NEGATIVE Proliferation Marker Ki67: 40% COMMENT: The negative hormone receptor study(ies) in this case has an internal positive control.    2. PROGNOSTIC INDICATORS Results: IMMUNOHISTOCHEMICAL AND MORPHOMETRIC ANALYSIS PERFORMED  MANUALLY The tumor cells are NEGATIVE for Her2 (1+). Estrogen Receptor: 10%, POSITIVE, WEAK STAINING INTENSITY Progesterone Receptor: 0%, NEGATIVE Proliferation Marker Ki67: 40% COMMENT: The negative hormone receptor study(ies) in this case has no internal positive control.   03/05/2020 Initial Diagnosis   Malignant neoplasm of upper-outer quadrant of left breast in female, estrogen receptor positive ([place])   03/18/2020 Breast MRI   IMPRESSION: 1. 7 x 9 x 6 cm area of suspicious non masslike enhancement within the central/UPPER RIGHT breast with anterior skin thickening. Given biopsy-proven metastatic RIGHT axillary lymph node, tissue sampling of the anterior and posterior aspects of this non masslike RIGHT breast enhancement is recommended to exclude malignancy. 2. 7 x 6 x 5 cm diffuse masslike and nonmasslike enhancement within the majority of the remaining LEFT breast with diffuse LEFT breast skin thickening, compatible with malignancy. Abnormal enhancement is noted along the anterior aspect of the LEFT pectoralis muscle. 3. Three abnormal RIGHT axillary lymph nodes, 1 which is biopsy proven to be metastatic disease. No abnormal LEFT axillary lymph nodes identified.   03/20/2020 Echocardiogram   Baseline ECHO  IMPRESSIONS     1. The patient was reported to be in pain during the study and not able  to participate in this study. All that can be said is that the LV and RV  function are grossly normal. Would recommend to repeat this study when/if  the patient is able to tolerate  the exam.   2. Left ventricular ejection fraction, by estimation, is 60 to 65%. The  left ventricle has normal function. Left ventricular endocardial border  not optimally defined to evaluate regional wall motion. Left ventricular  diastolic function could not be  evaluated.   3. Right ventricular systolic function is normal. The right ventricular  size is normal.   4. The mitral valve was not well  visualized. No evidence of mitral valve  regurgitation.   5. The aortic valve was not well visualized. Aortic valve regurgitation  is not visualized.   6. The inferior vena cava is normal in size with greater than 50%  respiratory variability, suggesting right atrial pressure of 3 mmHg.    03/20/2020 Imaging   CT CAP W contrast  IMPRESSION: 1. Small right axillary/subpectoral lymph nodes. Difficult to exclude metastatic disease. 2. Subtle 6 mm low-attenuation lesion in the dome of the liver, not well seen previously. Lesion is too small to characterize. Further evaluation could be performed with MR abdomen without and with contrast, as clinically indicated. 3. Hepatic steatosis. 4. Aortic atherosclerosis (ICD10-I70.0). Coronary artery calcification.     03/20/2020 Imaging   Whole body bone scan  IMPRESSION: 1. Increased activity noted over the right breast, possibly related to the patient's right breast cancer.   2. Punctate area of increased activity over the left maxillary region, most likely related to sinus disease or dental disease. Increased activity noted over the left mandible also possibly related to dental disease. No other focal bony abnormalities to suggest metastatic disease identified.   03/22/2020 Procedure   PAC placement by Dr [name]    03/22/2020 - 08/23/2020 Chemotherapy   AC q2weeks for 4 cycles 03/22/20-05/03/20 followed by weekly Taxol and Carboplatin q3weeks for 12 weeks starting 05/17/20.  Week 2 Taxol reduced to half dose and CT was dose reduced starting with week 4 due to thrombocytopenia. Given pancytopenia, will change Carboplatin to 50% dose weekly with Taxol 80mg /m2 starting with week 7 (06/13/20). Will hold Carboplatin as needed based on labs. Carboplatin increased to 80% starting with week 8. Carboplatin dose reduced for C11 and held with C12. C12 Taxol postponed to 08/23/20   03/22/2020 Pathology Results   FINAL MICROSCOPIC DIAGNOSIS:    A. BREAST, RIGHT, PUNCH BIOPSY:  - Carcinoma involving dermal lymphatics.  - See comment.   B. BREAST, LEFT, PUNCH BIOPSY:  - Carcinoma involving dermal lymphatics.  - See comment.   COMMENT:  The morphology is compatible with ductal breast carcinoma.    ADDENDUM:   PROGNOSTIC INDICATOR RESULTS:   Immunohistochemical and morphometric analysis performed manually   The tumor cells are NEGATIVE for Her2 (0%).   Estrogen Receptor:       NEGATIVE, 0%  Progesterone Receptor:   NEGATIVE, 0%    08/13/2020 Breast MRI   IMPRESSION: 1. Interval significant response to treatment of the diffuse non-mass enhancement involving the UPPER RIGHT breast. There is a solitary residual 5 mm indeterminate mass in the UPPER OUTER QUADRANT at MIDDLE depth with plateau kinetics. 2. Minimal response to treatment of the diffuse non-mass enhancement throughout the LEFT breast. 3. Resolution of enhancement of the skin and nipple-areolar complex of the RIGHT breast. 4. Near complete resolution of enhancement of the skin and nipple-areolar complex of the LEFT breast. Minimal residual nipple-areola complex enhancement persists. 5. The 3 previously identified pathologic RIGHT axillary lymph nodes are now normal in appearance. There is no pathologic lymphadenopathy currently.   08/23/2020 -  Chemotherapy   Immunotherapy Keytruda q3weeks starting 08/23/20 for 1 year treatment.    10/01/2020 Surgery   MASTECTOMIES WITH RIGHT SENTINEL LYMPH NODE BIOPSIES,RIGHT TARGETED RADIOACTIVE SEED LYMPH NODE, LEFT BREAST MODIFIED RADICAL MASTECTOMY  and RADIOACTIVE SEED GUIDED  TARGETED RIGHT SENTINEL LYMPH NODE BIOPSY by Dr 10/03/2020     10/01/2020 Pathology Results   FINAL MICROSCOPIC DIAGNOSIS:   A. BREAST, LEFT, MASTECTOMY:  - Invasive ductal carcinoma, 7 cm.  - Anterior and posterior soft tissue margins not involved by tumor.  - Foci of skin involvement by tumor, see comment.  - Nipple involvement by  tumor.  - See oncology table and comment.   B. LYMPH NODE, LEFT AXILLARY, CONTENTS:  - One lymph node with no metastatic carcinoma (0/1).   C. BREAST, RIGHT, MASTECTOMY:  - Invasive ductal carcinoma, greater than 5 cm.  - Margins not involved.  - Subareolar breast tissue involved by tumor.  - See oncology table and comment.   D. LYMPH NODE, RIGHT #2, SENTINEL, EXCISION:  - Metastatic carcinoma in one lymph node, 0.5 cm (1/1).   E. LYMPH NODE, RIGHT #1, SENTINEL, EXCISION:  - Metastatic carcinoma in one lymph node, 1.5 cm (1/1).  - Biopsy site and biopsy clip.   F. LYMPH NODE, RIGHT #3, SENTINEL, EXCISION:  - Metastatic carcinoma in one lymph node, 1.5 cm (1/1).   G. LYMPH NODE, RIGHT #4, SENTINEL, EXCISION:  - One lymph node with no metastatic carcinoma (0/1).   H. LYMPH NODE, RIGHT #5, SENTINEL, EXCISION:  - Metastatic carcinoma in one lymph node, 0.5 cm (1/1).   I. LYMPH NODE, RIGHT #6, SENTINEL, EXCISION:  - One lymph node with no metastatic carcinoma (0/1).     A.  LEFT BREAST PROGNOSTIC INDICATOR RESULTS:   Immunohistochemical and morphometric analysis performed manually  The tumor cells are NEGATIVE for Her2 (0).   Estrogen Receptor:       0%, Negative  Progesterone Receptor:   0%, Negative  Proliferation Marker Ki-67:   80%   Comment: The negative hormone receptor study(ies) in the case has an  internal positive control.   Reference Range Estrogen and Progesterone Receptor       Negative  0%       Positive  >1%   All controls stained appropriately.   C. RIGHT BREAST PROGNOSTIC INDICATOR RESULTS:   Immunohistochemical and morphometric analysis performed manually   The tumor cells are NEGATIVE for Her2 (0).   Estrogen Receptor:       0%, Negative  Progesterone Receptor:   0%, Negative  Proliferation Marker Ki-67:   90%   10/01/2020 Cancer Staging   Staging form: Breast, AJCC 8th Edition - Pathologic stage from 10/01/2020: Stage IIIC (pT4b, pN0,  cM0, G3, ER-, PR-, HER2-) - Signed by Pollyann Samples, NP on 10/24/2020   10/14/2020 Surgery   RIGHT AXILLARY LYMPH NODE DISSECTION by Dr Donell Beers  -1 /13 positive LN for metastatic carcinoma    11/18/2020 -  Chemotherapy   Adjuvant Xeloda 1500 mg BID days 1-14 q21 days x6 months, starting 11/18/2020 (will reduce to 1500 mg Am/1000 mg Pm M-F during radiation)   12/10/2020 - 01/23/2021 Radiation Therapy   Adjuvant Radiation to right chest wall and axilla with Dr Mitzi Hansen on 12/10/20-01/23/21   12/16/2020 Tumor Marker   CA 27.29 at 582.2 on 11/14/20   Ca 27.29 at 1387.9 on 12/16/20   01/13/2021 Progression   PET IMPRESSION: 1. Interval development of bulky hypermetabolic liver metastases involving both hepatic lobes. 2. Hypermetabolic lesion in the posterior left hemithorax. This is obscured by small pleural effusion on CT imaging and could be a pulmonary nodule or pleural nodule. Given superimposition of the pleural fluid, pleural nodule is favored. 3. Tiny subtle soft tissue nodule anterior paramidline left hemithorax with low level FDG uptake raises concern for a second site of pleural disease in the left chest. 4.  Aortic Atherosclerois (ICD10-170.0)      RISK FACTORS:  Menarche was at age 43.  First live birth at age 89.  OCP use for approximately 10 years.  Ovaries intact: yes.  Hysterectomy: no. Menopausal status: postmenopausal.  HRT use: 0 years. Colonoscopy: yes. Mammogram within the last year: yes.   Past Medical History:  Diagnosis Date  . Aortic atherosclerosis (HCC)   . Breast cancer (HCC) dx'd 1993   left  . Fatty liver   . History of left breast cancer   . HIV infection (HCC)   . Hypertension   . Port-A-Cath in place     Past Surgical History:  Procedure Laterality Date  . AXILLARY LYMPH NODE DISSECTION Right 10/14/2020   Procedure: left axilla removal of two drains right axillary lymph node dissection;  Surgeon: Almond Lint, MD;  Location: [facility];   Service: General;  Laterality: Right;  PEC BLOCK, RNFA OR PA  . BREAST BIOPSY Bilateral 03/22/2020   Procedure: BILATERAL BREAST PUNCH BIOPSIES;  Surgeon: Almond Lint, MD;  Location: [facility] OR;  Service: General;  Laterality: Bilateral;  . BREAST LUMPECTOMY    . CESAREAN SECTION    . left breast lumpectomy  1993  . MASTECTOMY W/ SENTINEL NODE BIOPSY Bilateral 10/01/2020  . MASTECTOMY W/ SENTINEL NODE BIOPSY Bilateral 10/01/2020   Procedure: BILATERAL MASTECTOMIES WITH RIGHT SENTINEL LYMPH NODE BIOPSIES,RIGHT TARGETED RADIOACTIVE SEED LYMPH NODE, LEFT BREAST  MODIFIED RADICAL MASTECTOMY ;  Surgeon: Almond Lint, MD;  Location: [facility] OR;  Service: General;  Laterality: Bilateral;  PEC BLOCK, RNFA  . PORTACATH PLACEMENT Right 03/22/2020   Procedure: INSERTION PORT-A-CATH WITH ULTRASOUND GUIDANCE;  Surgeon: Almond Lint, MD;  Location: [facility] OR;  Service: General;  Laterality: Right;  . RADIOACTIVE SEED GUIDED AXILLARY SENTINEL LYMPH NODE Right 10/01/2020   Procedure: RADIOACTIVE SEED GUIDED  TARGETED RIGHT SENTINEL LYMPH NODE BIOPSY;  Surgeon: Almond Lint, MD;  Location: [facility] OR;  Service: General;  Laterality: Right;  PEC BLOCK, RNFA    Social History   Socioeconomic History  . Marital status: Married    Spouse name: Not on file  . Number of children: 2  . Years of education: Not on file  . Highest education level: Bachelor's degree (e.g., BA, AB, BS)  Occupational History  . Not on file  Tobacco Use  . Smoking status: Never Smoker  . Smokeless tobacco: Never Used  Vaping Use  . Vaping Use: Never used  Substance and Sexual Activity  . Alcohol use: Not Currently    Comment: special occ  . Drug use: No  . Sexual activity: Not Currently    Birth control/protection: Post-menopausal  Other Topics Concern  . Not on file  Social History Narrative  . Not on file   Social Determinants of Health   Financial Resource Strain: Not on file  Food Insecurity: Not on file  Transportation Needs: No  Transportation Needs  . Lack of Transportation (Medical): No  . Lack of Transportation (Non-Medical): No  Physical Activity: Not on file  Stress: Not on file  Social Connections: Not on file     FAMILY HISTORY:  We obtained a detailed, 4-generation family history.  Significant diagnoses are listed below: Family History  Problem Relation Age of Onset  . Hypertension Mother   . Congestive Heart Failure Mother   . Diabetes Mother   . Congestive Heart Failure Father   . Diabetes Sister   . Diabetes Brother   . Diabetes Brother   . Heart failure Paternal Aunt   . Heart failure Paternal Uncle    Victoria Coffey has one daughter (age 34) and one son (age 77). She has three brothers (ages 21-63) and two sisters (ages 60-66). None of these relatives have had cancer.  Victoria Coffey mother is alive at age 56 without cancer. There were three maternal aunts and one maternal uncle. There is no known cancer among maternal aunts/uncles or maternal cousins. Victoria Coffey maternal grandmother died in her 30s or 81s without cancer. Her maternal grandfather died at age 18 without cancer.  Victoria Coffey father died at age 35 without cancer. There were four paternal aunts and four paternal uncles, most of whom died from heart failure. There is no known cancer among paternal aunts/uncles or paternal cousins. Victoria Coffey paternal grandmother died in her 25s without cancer. Her paternal grandfather died in his late 67s without cancer.  Victoria Coffey is unaware of previous family history of genetic testing for hereditary cancer risks. Patient's ancestors are of Black/African American descent. There is no reported Ashkenazi Jewish ancestry. There is no known consanguinity.  GENETIC COUNSELING ASSESSMENT: Victoria Coffey is a 65 y.o. female with a personal history of young-onset breast cancer, which is somewhat suggestive of a hereditary cancer syndrome and predisposition to cancer. We, therefore, discussed and recommended the  following at today's visit.   DISCUSSION: We discussed that approximately 5-10% of breast cancer is hereditary, with most cases  associated with the BRCA1 and BRCA2 genes. There are other genes that can be associated with hereditary breast cancer syndromes. These include ATM, CHEK2, PALB2, etc. We discussed that testing is beneficial for several reasons, including identifying whether targeted treatment options such as PARP inhibitors would be beneficial, knowing about other cancer risks, identifying potential screening and risk-reduction options that may be appropriate, and to understand if other family members could be at risk for cancer and allow them to undergo genetic testing.  We reviewed the characteristics, features and inheritance patterns of hereditary cancer syndromes. We also discussed genetic testing, including the appropriate family members to test, the process of testing, insurance coverage and turn-around-time for results. We discussed the implications of a negative, positive and/or variant of uncertain significant result. We recommended Victoria Coffey pursue genetic testing for the [facility] CustomNext-Cancer + RNAinsight panel.   The CustomNext-Cancer+RNAinsight panel offered by [facility] includes sequencing and rearrangement analysis for the following 47 genes:  APC, ATM, AXIN2, BARD1, BMPR1A, BRCA1, BRCA2, BRIP1, CDH1, CDK4, CDKN2A, CHEK2, DICER1, EPCAM, GREM1, HOXB13, MEN1, MLH1, MSH2, MSH3, MSH6, MUTYH, NBN, NF1, NF2, NTHL1, PALB2, PMS2, POLD1, POLE, PTEN, RAD51C, RAD51D, RECQL, RET, SDHA, SDHAF2, SDHB, SDHC, SDHD, SMAD4, SMARCA4, STK11, TP53, TSC1, TSC2, and VHL.  RNA data is routinely analyzed for use in variant interpretation for all genes.  Based on Victoria Coffey personal history of cancer, she meets medical criteria for genetic testing. Despite that she meets criteria, there may still be an out of pocket cost.   PLAN: After considering the risks, benefits, and limitations, Victoria Coffey  provided informed consent to pursue genetic testing and the blood sample was sent to [facility] for analysis of the CustomNext-Cancer + RNAinsight panel. Results should be available within approximately two-three weeks' time, at which point they will be disclosed by telephone to Victoria Coffey, as will any additional recommendations warranted by these results. Victoria Coffey will receive a summary of her genetic counseling visit and a copy of her results once available. This information will also be available in Epic.   Victoria Coffey questions were answered to her satisfaction today. Our contact information was provided should additional questions or concerns arise. Thank you for the referral and allowing [place] to share in the care of your patient.   Clint Guy, [place], [facility] Licensed, Certified Dispensing [email or web address] Phone: [phone]  The patient was seen for a total of 60 minutes in face-to-face genetic counseling.  This patient was discussed with Drs. Magrinat, Lindi [name] and/or Burr Medico who agrees with the above.    _______________________________________________________________________ For Office Staff:  Number of people involved in session: 2 Was an Intern/ student involved with case: yes

## 2021-01-15 ENCOUNTER — Ambulatory Visit (HOSPITAL_COMMUNITY)
Admission: RE | Admit: 2021-01-15 | Discharge: 2021-01-15 | Disposition: A | Discharge status: Home / Self Care | Payer: BC Managed Care – PPO | Source: Ambulatory Visit | Attending: Hematology | Admitting: Hematology

## 2021-01-15 ENCOUNTER — Ambulatory Visit: Payer: BC Managed Care – PPO

## 2021-01-15 DIAGNOSIS — K769 Liver disease, unspecified: Secondary | ICD-10-CM | POA: Diagnosis present

## 2021-01-15 DIAGNOSIS — Z853 Personal history of malignant neoplasm of breast: Secondary | ICD-10-CM | POA: Insufficient documentation

## 2021-01-15 DIAGNOSIS — C787 Secondary malignant neoplasm of liver and intrahepatic bile duct: Secondary | ICD-10-CM | POA: Insufficient documentation

## 2021-01-15 DIAGNOSIS — C50412 Malignant neoplasm of upper-outer quadrant of left female breast: Secondary | ICD-10-CM

## 2021-01-15 LAB — CBC
HCT: 30.9 % — ABNORMAL LOW (ref 36.0–46.0)
Hemoglobin: 10.7 g/dL — ABNORMAL LOW (ref 12.0–15.0)
MCH: 31.8 pg (ref 26.0–34.0)
MCHC: 34.6 g/dL (ref 30.0–36.0)
MCV: 91.7 fL (ref 80.0–100.0)
Platelets: 155 10*3/uL (ref 150–400)
RBC: 3.37 MIL/uL — ABNORMAL LOW (ref 3.87–5.11)
RDW: 21.1 % — ABNORMAL HIGH (ref 11.5–15.5)
WBC: 2.8 10*3/uL — ABNORMAL LOW (ref 4.0–10.5)
nRBC: 0 % (ref 0.0–0.2)

## 2021-01-15 LAB — PROTIME-INR
INR: 1.2 (ref 0.8–1.2)
Prothrombin Time: 14.4 seconds (ref 11.4–15.2)

## 2021-01-15 LAB — GENETIC SCREENING ORDER

## 2021-01-15 MED ORDER — FENTANYL CITRATE (PF) 100 MCG/2ML IJ SOLN
INTRAMUSCULAR | Status: AC
  Filled 2021-01-15: qty 2

## 2021-01-15 MED ORDER — ACETAMINOPHEN 500 MG PO TABS
500.0000 mg | ORAL_TABLET | Freq: Four times a day (QID) | ORAL | Status: DC | PRN
  Filled 2021-01-15: qty 1

## 2021-01-15 MED ORDER — MIDAZOLAM HCL 2 MG/2ML IJ SOLN
INTRAMUSCULAR | Status: AC | PRN
  Administered 2021-01-15: 1 mg via INTRAVENOUS

## 2021-01-15 MED ORDER — LIDOCAINE HCL (PF) 1 % IJ SOLN
INTRAMUSCULAR | Status: AC
  Filled 2021-01-15: qty 30

## 2021-01-15 MED ORDER — GELATIN ABSORBABLE 12-7 MM EX MISC
CUTANEOUS | Status: AC
  Filled 2021-01-15: qty 1

## 2021-01-15 MED ORDER — MIDAZOLAM HCL 2 MG/2ML IJ SOLN
INTRAMUSCULAR | Status: AC
  Filled 2021-01-15: qty 2

## 2021-01-15 MED ORDER — SODIUM CHLORIDE 0.9 % IV SOLN: INTRAVENOUS | Status: DC

## 2021-01-15 MED ORDER — FENTANYL CITRATE (PF) 100 MCG/2ML IJ SOLN
INTRAMUSCULAR | Status: AC | PRN
  Administered 2021-01-15: 50 ug via INTRAVENOUS

## 2021-01-15 MED ORDER — HEPARIN SOD (PORK) LOCK FLUSH 100 UNIT/ML IV SOLN
INTRAVENOUS | Status: AC
  Administered 2021-01-15: 500 [IU]
  Filled 2021-01-15: qty 5

## 2021-01-15 NOTE — Procedures (Signed)
Interventional Radiology Procedure:   Indications: Breast cancer with liver lesions   Procedure: US guided core biopsy of liver lesion  Findings: Core biopsies obtained from right hepatic lesion.  Gelfoam slurry injected as needle was removed.  Complications: None     EBL: less than 10 ml  Plan: Bedrest 3 hours   Lancelot Alyea R. Anselm Pancoast, MD  Pager: [phone]

## 2021-01-15 NOTE — Discharge Instructions (Signed)
Liver Biopsy, Care After These instructions give you information about how to care for yourself after your procedure. Your health care provider may also give you more specific instructions. If you have problems or questions, contact your health care provider. What can I expect after the procedure? After your procedure, it is common to have:  Pain and soreness in the area where the biopsy was done.  Bruising around the area where the biopsy was done.  Sleepiness and fatigue for 1-2 days. Follow these instructions at home: Medicines  Take over-the-counter and prescription medicines only as told by your health care provider.  If you were prescribed an antibiotic medicine, take it as told by your health care provider. Do not stop taking the antibiotic even if you start to feel better.  Do not take medicines such as aspirin and ibuprofen unless your health care provider tells you to take them. These medicines thin your blood and can increase the risk of bleeding.  If you are taking prescription pain medicine, take actions to prevent or treat constipation. Your health care provider may recommend that you: ? Drink enough fluid to keep your urine pale yellow. ? Eat foods that are high in fiber, such as fresh fruits and vegetables, whole grains, and beans. ? Limit foods that are high in fat and processed sugars, such as fried or sweet foods. ? Take an over-the-counter or prescription medicine for constipation. Incision care  Follow instructions from your health care provider about how to take care of your incision. Make sure you: ? Wash your hands with soap and water before you change your bandage (dressing). If soap and water are not available, use hand sanitizer. ? Change your dressing as told by your health care provider. ? Leave stitches (sutures), skin glue, or adhesive strips in place. These skin closures may need to stay in place for 2 weeks or longer. If adhesive strip edges start to  loosen and curl up, you may trim the loose edges. Do not remove adhesive strips completely unless your health care provider tells you to do that.  Check your incision area every day for signs of infection. Check for: ? Redness, swelling, or pain. ? Fluid or blood. ? Warmth. ? Pus or a bad smell.  Do not take baths, swim, or use a hot tub until your health care provider says it is okay to do so. Activity  Rest at home for 1-2 days, or as directed by your health care provider. ? Avoid sitting for a long time without moving. Get up to take short walks every 1-2 hours. This is important to improve blood flow and breathing. Ask for help if you feel weak or unsteady.  Return to your normal activities as told by your health care provider. Ask your health care provider what activities are safe for you.  Do not drive or use heavy machinery while taking prescription pain medicine.  Do not lift anything that is heavier than 10 lb (4.5 kg), or the limit that your health care provider tells you, until he or she says that it is safe.  Do not play contact sports for 2 weeks after the procedure.   General instructions  Do not drink alcohol in the first week after the procedure.  Have someone stay with you for at least 24 hours after the procedure.  It is your responsibility to obtain your test results. Ask your health care provider, or the department that is doing the test: ? When will my   results be ready? ? How will I get my results? ? What are my treatment options? ? What other tests do I need? ? What are my next steps?  Keep all follow-up visits as told by your health care provider. This is important.   Contact a health care provider if:  You have increased bleeding from an incision, resulting in more than a small spot of blood.  You have redness, swelling, or increasing pain in any incisions.  You notice a discharge or a bad smell coming from any of your incisions.  You have a fever or  chills. Get help right away if:  You develop swelling, bloating, or pain in your abdomen.  You become dizzy or faint.  You develop a rash.  You have nausea or you vomit.  You faint, or you have shortness of breath or difficulty breathing.  You develop chest pain.  You have problems with your speech or vision.  You have trouble with your balance or moving your arms or legs. Summary  After the liver biopsy, it is common to have pain, soreness, and bruising in the area, as well as sleepiness and fatigue.  Take over-the-counter and prescription medicines only as told by your health care provider.  Follow instructions from your health care provider about how to care for your incision. Check the incision area daily for signs of infection. This information is not intended to replace advice given to you by your health care provider. Make sure you discuss any questions you have with your health care provider. Document Revised: 12/12/2018 Document Reviewed: 10/29/2017 Elsevier Patient Education  2021 [employer]  

## 2021-01-15 NOTE — H&P (Signed)
Chief Complaint: Patient was seen in consultation today for liver mass biopsy at the request of Feng,Yan  Referring Physician(s): Feng,Yan  Supervising Physician: Markus Daft  Patient Status: [facility] - Out-pt  History of Present Illness: Victoria Coffey is a 65 y.o. female with hx of breast cancer, now found to have liver lesions. She is referred for biopsy. PMHx, meds, labs, imaging, allergies reviewed. Feels well, no recent fevers, chills, illness. Has been NPO today as directed.   Past Medical History:  Diagnosis Date  . Aortic atherosclerosis ([place])   . Breast cancer ([place]) dx'd 1993   left  . Fatty liver   . History of left breast cancer   . HIV infection ([place])   . Hypertension   . Port-A-Cath in place     Past Surgical History:  Procedure Laterality Date  . AXILLARY LYMPH NODE DISSECTION Right 10/14/2020   Procedure: left axilla removal of two drains right axillary lymph node dissection;  Surgeon: [name], MD;  Location: [facility];  Service: General;  Laterality: Right;  PEC BLOCK, RNFA OR PA  . BREAST BIOPSY Bilateral 03/22/2020   Procedure: BILATERAL BREAST PUNCH BIOPSIES;  Surgeon: [name], MD;  Location: [facility];  Service: General;  Laterality: Bilateral;  . BREAST LUMPECTOMY    . CESAREAN SECTION    . left breast lumpectomy  1993  . MASTECTOMY W/ SENTINEL NODE BIOPSY Bilateral 10/01/2020  . MASTECTOMY W/ SENTINEL NODE BIOPSY Bilateral 10/01/2020   Procedure: BILATERAL MASTECTOMIES WITH RIGHT SENTINEL LYMPH NODE BIOPSIES,RIGHT TARGETED RADIOACTIVE SEED LYMPH NODE, LEFT BREAST MODIFIED RADICAL MASTECTOMY ;  Surgeon: [name], MD;  Location: [facility];  Service: General;  Laterality: Bilateral;  PEC BLOCK, RNFA  . PORTACATH PLACEMENT Right 03/22/2020   Procedure: INSERTION PORT-A-CATH WITH ULTRASOUND GUIDANCE;  Surgeon: [name], MD;  Location: [facility];  Service: General;  Laterality: Right;  . RADIOACTIVE SEED GUIDED AXILLARY SENTINEL LYMPH NODE Right  10/01/2020   Procedure: RADIOACTIVE SEED GUIDED  TARGETED RIGHT SENTINEL LYMPH NODE BIOPSY;  Surgeon: [name], MD;  Location: [facility];  Service: General;  Laterality: Right;  PEC BLOCK, RNFA    Allergies: Codeine, Other, Grapefruit bioflavonoid complex, Pomegranate [punica], and Shellfish allergy  Medications: Prior to Admission medications   Medication Sig Start Date End Date Taking? Authorizing Provider  acetaminophen (TYLENOL) 500 MG tablet Take 1,000 mg by mouth every 6 (six) hours as needed for moderate pain or headache.    Yes [provider]  capecitabine (XELODA) 500 MG tablet Take 3 tabs in morning and 2 tabs in evening, on days of radiation, Monday through Friday. Take within 30 minutes after meals. 01/07/21  Yes [name], MD  emtricitabine-rilpivir-tenofovir AF (ODEFSEY) 200-25-25 MG TABS tablet Take 1 tablet by mouth daily with breakfast. 11/06/19  Yes [name], MD  hydrochlorothiazide (HYDRODIURIL) 25 MG tablet TAKE 1 TABLET(25 MG) BY MOUTH DAILY Patient taking differently: Take 25 mg by mouth daily. 12/19/20  Yes [name], MD  lidocaine-prilocaine (EMLA) cream Apply 1 application topically See admin instructions. Apply 1-2 hours prior to access   Yes [provider]  loratadine (CLARITIN) 10 MG tablet Take 10 mg by mouth See admin instructions. Takes 3-4 days before and after treatment   Yes [provider]  potassium chloride SA (KLOR-CON) 20 MEQ tablet Take 1 tablet (20 mEq total) by mouth daily. 01/06/21  Yes [name], MD  traMADol (ULTRAM) 50 MG tablet Take 1 tablet (50 mg total) by mouth every 6 (  six) hours as needed (mild pain). Patient not taking: Reported on 01/14/2021 10/14/20   [name], MD  prochlorperazine (COMPAZINE) 10 MG tablet Take 1 tablet (10 mg total) by mouth every 6 (six) hours as needed (Nausea or vomiting). 05/31/20 09/13/20  [name], MD     Family History  Problem Relation Age of Onset  . Hypertension Mother    . Congestive Heart Failure Mother   . Diabetes Mother   . Congestive Heart Failure Father   . Diabetes Sister   . Diabetes Brother   . Diabetes Brother   . Heart failure Paternal Aunt   . Heart failure Paternal Uncle     Social History   Socioeconomic History  . Marital status: Married    Spouse name: Not on file  . Number of children: 2  . Years of education: Not on file  . Highest education level: Bachelor's degree (e.g., BA, AB, BS)  Occupational History  . Not on file  Tobacco Use  . Smoking status: Never Smoker  . Smokeless tobacco: Never Used  Vaping Use  . Vaping Use: Never used  Substance and Sexual Activity  . Alcohol use: Not Currently    Comment: special occ  . Drug use: No  . Sexual activity: Not Currently    Birth control/protection: Post-menopausal  Other Topics Concern  . Not on file  Social History Narrative  . Not on file   Social Determinants of Health   Financial Resource Strain: Not on file  Food Insecurity: Not on file  Transportation Needs: No Transportation Needs  . Lack of Transportation (Medical): No  . Lack of Transportation (Non-Medical): No  Physical Activity: Not on file  Stress: Not on file  Social Connections: Not on file     Review of Systems: A 12 point ROS discussed and pertinent positives are indicated in the HPI above.  All other systems are negative.  Review of Systems  Vital Signs: BP (!) 142/88   Pulse 91   Temp 97.9 F (36.6 C)   Ht 5' (1.524 m)   Wt 55.8 kg   SpO2 100%   BMI 24.02 kg/m   Physical Exam Constitutional:      Appearance: Normal appearance. She is not ill-appearing.  HENT:     Mouth/Throat:     Mouth: Mucous membranes are moist.     Pharynx: Oropharynx is clear.  Cardiovascular:     Rate and Rhythm: Normal rate and regular rhythm.     Heart sounds: Normal heart sounds.  Pulmonary:     Effort: Pulmonary effort is normal. No respiratory distress.     Breath sounds: Normal breath sounds.   Abdominal:     General: Abdomen is flat. There is no distension.     Palpations: There is no mass.     Tenderness: There is no abdominal tenderness.  Skin:    General: Skin is warm and dry.  Neurological:     General: No focal deficit present.     Mental Status: She is alert and oriented to person, place, and time.  Psychiatric:        Mood and Affect: Mood normal.        Thought Content: Thought content normal.        Judgment: Judgment normal.     Imaging: NM PET Image Restage (PS) Skull Base to Thigh  Result Date: 01/13/2021 CLINICAL DATA:  Subsequent treatment strategy for breast cancer. EXAM: NUCLEAR MEDICINE PET SKULL BASE TO THIGH TECHNIQUE:  6.3 mCi F-18 FDG was injected intravenously. Full-ring PET imaging was performed from the skull base to thigh after the radiotracer. CT data was obtained and used for attenuation correction and anatomic localization. Fasting blood glucose: 115 mg/dl COMPARISON:  Abdominal MRI 04/17/2020. Chest on pelvis CT 03/20/2020. FINDINGS: Mediastinal blood pool activity: SUV max 2.6 Liver activity: SUV max NA NECK: No hypermetabolic lymph nodes in the neck.Focal FDG accumulation identified in the region of the posterior right upper molar. Dental carie or. Cul abscess would be a consideration. Incidental CT findings: none CHEST: Focal hypermetabolic lesion identified posterior pleural left hemithorax with SUV max = 7.8. There is a small left pleural effusion in the hypermetabolic focus is within the pleural fluid, likely obscuring underlying nodule. Tiny foci of low level FDG accumulation are identified along the anterior left chest wall just lateral to the internal mammary chain where a small subtle 5 mm pleural nodule is seen on image 60/series 4. No other hypermetabolic lymphadenopathy in the chest. Incidental CT findings: Surgical clips are noted in the axillary regions bilaterally. Right Port-A-Cath tip is positioned at the SVC/RA junction. Coronary artery  calcification is evident. Subtle pleural nodularity is seen along the left major fissure. ABDOMEN/PELVIS: Interval development of multiple bulky liver lesions. These are hypermetabolic consistent with metastatic disease. 2.7 cm lesion in the dome of the right liver demonstrates SUV max = 12.1. Posterior right hepatic lesion measuring approximately 3.8 cm on CT image 86 of series 4 demonstrates SUV max = 13.8. 2.6 cm lesion in the lateral segment left liver on 92/4 has SUV max = 13.5. No hypermetabolic abdominal lymphadenopathy. Incidental CT findings: No hydronephrosis in either kidney. There is abdominal aortic atherosclerosis without aneurysm. SKELETON: Incidental CT findings: none IMPRESSION: 1. Interval development of bulky hypermetabolic liver metastases involving both hepatic lobes. 2. Hypermetabolic lesion in the posterior left hemithorax. This is obscured by small pleural effusion on CT imaging and could be a pulmonary nodule or pleural nodule. Given superimposition of the pleural fluid, pleural nodule is favored. 3. Tiny subtle soft tissue nodule anterior paramidline left hemithorax with low level FDG uptake raises concern for a second site of pleural disease in the left chest. 4.  Aortic Atherosclerois (ICD10-170.0) Electronically Signed   By: Misty Stanley M.D.   On: 01/13/2021 09:17    Labs:  CBC: Recent Labs    12/26/20 0830 12/30/20 0720 01/06/21 0945 01/13/21 0923  WBC 3.2* 4.4 3.9* 3.6*  HGB 11.2* 11.2* 11.1* 11.2*  HCT 32.3* 32.6* 32.5* 32.7*  PLT 201 218 205 187    COAGS: No results for input(s): INR, APTT in the last 8760 hours.  BMP: Recent Labs    07/12/20 0824 07/19/20 0815 07/26/20 0931 08/02/20 0841 08/09/20 0833 12/26/20 0830 12/30/20 0745 01/06/21 0945 01/13/21 0923  NA 139 137 137 134*   < > 138 132* 135 136  K 4.9 4.0 4.3 4.1   < > 3.8 4.2 4.4 3.6  CL 112* 107 107 104   < > 103 102 105 104  CO2 19* 21* 25 24   < > 24 20* 22 22  GLUCOSE 94 102* 94 96    < > 100* 104* 99 100*  BUN 15 14 15 10    < > 16 17 16 13   CALCIUM 9.2 9.0 8.9 9.2   < > 8.9 8.9 9.1 9.1  CREATININE 0.81 0.83 0.80 0.86   < > 0.98 0.97 0.96 0.91  GFRNONAA >60 >60 >60 >60   < > >  60 >60 >60 >60  GFRAA >60 >60 >60 >60  --   --   --   --   --    < > = values in this interval not displayed.    LIVER FUNCTION TESTS: Recent Labs    12/26/20 0830 12/30/20 0745 01/06/21 0945 01/13/21 0923  BILITOT 0.4 0.4 0.4 0.5  AST 70* 74* 86* 113*  ALT 64* 56* 69* 85*  ALKPHOS 557* 506* 531* 640*  PROT 7.6 7.7 7.6 7.4  ALBUMIN 3.5 3.5 3.4* 3.2*    TUMOR MARKERS: No results for input(s): AFPTM, CEA, CA199, CHROMGRNA in the last 8760 hours.  Assessment and Plan: Liver lesions Breast cancer For image guided biopsy liver lesion today Labs pending Risks and benefits of liver bx was discussed with the patient and/or patient's family including, but not limited to bleeding, infection, damage to adjacent structures or low yield requiring additional tests.  All of the questions were answered and there is agreement to proceed.  Consent signed and in chart.    Thank you for this interesting consult.  I greatly enjoyed meeting Victoria Coffey and look forward to participating in their care.  A copy of this report was sent to the requesting provider on this date.  Electronically Signed: Ascencion Dike, PA-C 01/15/2021, 12:07 PM   I spent a total of 20 minutes in face to face in clinical consultation, greater than 50% of which was counseling/coordinating care for liver bx

## 2021-01-16 ENCOUNTER — Ambulatory Visit: Payer: BC Managed Care – PPO

## 2021-01-16 ENCOUNTER — Other Ambulatory Visit: Payer: Self-pay

## 2021-01-16 ENCOUNTER — Other Ambulatory Visit: Payer: Self-pay | Admitting: Pharmacist

## 2021-01-16 ENCOUNTER — Ambulatory Visit
Admission: RE | Admit: 2021-01-16 | Discharge: 2021-01-16 | Disposition: A | Discharge status: Home / Self Care | Payer: BC Managed Care – PPO | Source: Ambulatory Visit | Attending: Radiation Oncology | Admitting: Radiation Oncology

## 2021-01-16 DIAGNOSIS — C50412 Malignant neoplasm of upper-outer quadrant of left female breast: Secondary | ICD-10-CM | POA: Diagnosis not present

## 2021-01-17 ENCOUNTER — Other Ambulatory Visit: Payer: Self-pay

## 2021-01-17 ENCOUNTER — Ambulatory Visit: Payer: BC Managed Care – PPO

## 2021-01-17 ENCOUNTER — Ambulatory Visit
Admission: RE | Admit: 2021-01-17 | Discharge: 2021-01-17 | Disposition: A | Discharge status: Home / Self Care | Payer: BC Managed Care – PPO | Source: Ambulatory Visit | Attending: Radiation Oncology | Admitting: Radiation Oncology

## 2021-01-17 DIAGNOSIS — C50412 Malignant neoplasm of upper-outer quadrant of left female breast: Secondary | ICD-10-CM | POA: Diagnosis not present

## 2021-01-17 LAB — SURGICAL PATHOLOGY

## 2021-01-17 NOTE — Progress Notes (Signed)
[place]   [phone] Fax:([phone]) [phone]   Clinic Follow up Note   Patient Care Team: [name], MD as PCP - General (Infectious Diseases) [name], MD as PCP - Infectious Diseases (Infectious Diseases) [name], MD as Consulting Physician (General Surgery) [name], MD as Consulting Physician (Hematology) [name], MD as Consulting Physician (Radiation Oncology) [name], RN as Oncology Nurse Navigator [name], RN as Oncology Nurse Navigator  Date of Service:  01/20/2021  CHIEF COMPLAINT: F/u ofbilateralbreast cancer  SUMMARY OF ONCOLOGIC HISTORY: Oncology History Overview Note  Cancer Staging Bilateral breast cancer [facility]) Staging form: Breast, AJCC 8th Edition - Pathologic stage from 10/01/2020: Stage IIIC (pT3, pN2a, cM0, G3, ER-, PR-, HER2-) - Signed by Alla Feeling, NP on 10/24/2020  Malignant neoplasm of upper-outer quadrant of left breast in female, estrogen receptor positive ([place]) Staging form: Breast, AJCC 8th Edition - Clinical stage from 03/01/2020: Stage IIIC (cT4, cN0, cM0, G3, ER+, PR-, HER2-) - Signed by [name], MD on 08/22/2020 - Pathologic stage from 10/01/2020: Stage IIIC (pT4b, pN0, cM0, G3, ER-, PR-, HER2-) - Signed by Alla Feeling, NP on 10/24/2020    Malignant neoplasm of upper-outer quadrant of left breast in female, estrogen receptor negative ([place])  02/28/2020 Mammogram   Diagnostic Mammogram 02/28/20  IMPRESSION The 3.9 cm ill defined mass in the left breast posterior depth central 4.3cm to the nipple seen on the mediolateral oblique view onlt with associated difssue skin thickening is highly suspicious for malignancy.    03/01/2020 Cancer Staging   Staging form: Breast, AJCC 8th Edition - Clinical stage from 03/01/2020: Stage IIIC (cT4, cN0, cM0, G3, ER+, PR-, HER2-) - Signed by [name], MD on 08/22/2020   03/01/2020 Initial Biopsy   Diagnosis 03/01/20  1. Breast, left,  needle core biopsy, 9 o'clock, 3-4cmfn - INVASIVE DUCTAL CARCINOMA. SEE NOTE 2. Lymph node, needle/core biopsy, right axilla - INVASIVE DUCTAL CARCINOMA. SEE NOTE Diagnosis Note 1. Invasive carcinoma measures 1.5 cm in greatest linear dimension and appears grade 3. Dr. [name] reviewed the case and concurs with the diagnosis. A breast prognostic profile (ER, PR, Ki-67 and HER2) is pending and will be reported in an addendum. Dr. [name] was notified on 03/04/2020. 2. Carcinoma measures 0.6 cm in greatest linear dimension and appears grade 3. Lymphoid tissue is not identified - this may represent a completely replaced lymph node. A breast prognostic profile (ER, PR, Ki-67 and HER2) is pending and will be reported in an addendum. Dr. [name] reviewed the case and concurs with the diagnosis. Dr. [name] was notified on 03/04/2020.   03/01/2020 Receptors her2   1. PROGNOSTIC INDICATORS Results: IMMUNOHISTOCHEMICAL AND MORPHOMETRIC ANALYSIS PERFORMED MANUALLY The tumor cells are NEGATIVE for Her2 (0). Estrogen Receptor: 20%, POSITIVE, WEAK STAINING INTENSITY Progesterone Receptor: 0%, NEGATIVE Proliferation Marker Ki67: 40% COMMENT: The negative hormone receptor study(ies) in this case has an internal positive control.    2. PROGNOSTIC INDICATORS Results: IMMUNOHISTOCHEMICAL AND MORPHOMETRIC ANALYSIS PERFORMED MANUALLY The tumor cells are NEGATIVE for Her2 (1+). Estrogen Receptor: 10%, POSITIVE, WEAK STAINING INTENSITY Progesterone Receptor: 0%, NEGATIVE Proliferation Marker Ki67: 40% COMMENT: The negative hormone receptor study(ies) in this case has no internal positive control.   03/05/2020 Initial Diagnosis   Malignant neoplasm of upper-outer quadrant of left breast in female, estrogen receptor positive ([place])   03/18/2020 Breast MRI   IMPRESSION: 1. 7 x 9 x 6 cm area of suspicious non masslike enhancement within the central/UPPER RIGHT breast  with anterior skin thickening.  Given biopsy-proven metastatic RIGHT axillary lymph node, tissue sampling of the anterior and posterior aspects of this non masslike RIGHT breast enhancement is recommended to exclude malignancy. 2. 7 x 6 x 5 cm diffuse masslike and nonmasslike enhancement within the majority of the remaining LEFT breast with diffuse LEFT breast skin thickening, compatible with malignancy. Abnormal enhancement is noted along the anterior aspect of the LEFT pectoralis muscle. 3. Three abnormal RIGHT axillary lymph nodes, 1 which is biopsy proven to be metastatic disease. No abnormal LEFT axillary lymph nodes identified.   03/20/2020 Echocardiogram   Baseline ECHO  IMPRESSIONS     1. The patient was reported to be in pain during the study and not able  to participate in this study. All that can be said is that the LV and RV  function are grossly normal. Would recommend to repeat this study when/if  the patient is able to tolerate  the exam.   2. Left ventricular ejection fraction, by estimation, is 60 to 65%. The  left ventricle has normal function. Left ventricular endocardial border  not optimally defined to evaluate regional wall motion. Left ventricular  diastolic function could not be  evaluated.   3. Right ventricular systolic function is normal. The right ventricular  size is normal.   4. The mitral valve was not well visualized. No evidence of mitral valve  regurgitation.   5. The aortic valve was not well visualized. Aortic valve regurgitation  is not visualized.   6. The inferior vena cava is normal in size with greater than 50%  respiratory variability, suggesting right atrial pressure of 3 mmHg.    03/20/2020 Imaging   CT CAP W contrast  IMPRESSION: 1. Small right axillary/subpectoral lymph nodes. Difficult to exclude metastatic disease. 2. Subtle 6 mm low-attenuation lesion in the dome of the liver, not well seen previously. Lesion is too small to characterize. Further evaluation  could be performed with MR abdomen without and with contrast, as clinically indicated. 3. Hepatic steatosis. 4. Aortic atherosclerosis (ICD10-I70.0). Coronary artery calcification.     03/20/2020 Imaging   Whole body bone scan  IMPRESSION: 1. Increased activity noted over the right breast, possibly related to the patient's right breast cancer.   2. Punctate area of increased activity over the left maxillary region, most likely related to sinus disease or dental disease. Increased activity noted over the left mandible also possibly related to dental disease. No other focal bony abnormalities to suggest metastatic disease identified.   03/22/2020 Procedure   PAC placement by Dr [name]    03/22/2020 - 08/23/2020 Chemotherapy   AC q2weeks for 4 cycles 03/22/20-05/03/20 followed by weekly Taxol and Carboplatin q3weeks for 12 weeks starting 05/17/20.                  Week 2 Taxol reduced to half dose and CT was dose reduced starting with week 4 due to thrombocytopenia. Given pancytopenia, will change Carboplatin to 50% dose weekly with Taxol 63m/m2 starting with week 7 (06/13/20). Will hold Carboplatin as needed based on labs. Carboplatin increased to 80% starting with week 8. Carboplatin dose reduced for C11 and held with C12. C12 Taxol postponed to 08/23/20   03/22/2020 Pathology Results   FINAL MICROSCOPIC DIAGNOSIS:   A. BREAST, RIGHT, PUNCH BIOPSY:  - Carcinoma involving dermal lymphatics.  - See comment.   B. BREAST, LEFT, PUNCH BIOPSY:  - Carcinoma involving dermal lymphatics.  - See comment.   COMMENT:  The  morphology is compatible with ductal breast carcinoma.    ADDENDUM:   PROGNOSTIC INDICATOR RESULTS:   Immunohistochemical and morphometric analysis performed manually   The tumor cells are NEGATIVE for Her2 (0%).   Estrogen Receptor:       NEGATIVE, 0%  Progesterone Receptor:   NEGATIVE, 0%    08/13/2020 Breast MRI   IMPRESSION: 1. Interval significant response to  treatment of the diffuse non-mass enhancement involving the UPPER RIGHT breast. There is a solitary residual 5 mm indeterminate mass in the UPPER OUTER QUADRANT at MIDDLE depth with plateau kinetics. 2. Minimal response to treatment of the diffuse non-mass enhancement throughout the LEFT breast. 3. Resolution of enhancement of the skin and nipple-areolar complex of the RIGHT breast. 4. Near complete resolution of enhancement of the skin and nipple-areolar complex of the LEFT breast. Minimal residual nipple-areola complex enhancement persists. 5. The 3 previously identified pathologic RIGHT axillary lymph nodes are now normal in appearance. There is no pathologic lymphadenopathy currently.   08/23/2020 - 01/13/2021 Chemotherapy   Immunotherapy Keytruda q3weeks starting 08/23/20 for 1 year treatment. D/c on 01/13/21 given disease recurrence/progression   10/01/2020 Surgery   MASTECTOMIES WITH RIGHT SENTINEL LYMPH NODE BIOPSIES,RIGHT TARGETED RADIOACTIVE SEED LYMPH NODE, LEFT BREAST MODIFIED RADICAL MASTECTOMY  and RADIOACTIVE SEED GUIDED  TARGETED RIGHT SENTINEL LYMPH NODE BIOPSY by Dr [name]     10/01/2020 Pathology Results   FINAL MICROSCOPIC DIAGNOSIS:   A. BREAST, LEFT, MASTECTOMY:  - Invasive ductal carcinoma, 7 cm.  - Anterior and posterior soft tissue margins not involved by tumor.  - Foci of skin involvement by tumor, see comment.  - Nipple involvement by tumor.  - See oncology table and comment.   B. LYMPH NODE, LEFT AXILLARY, CONTENTS:  - One lymph node with no metastatic carcinoma (0/1).   C. BREAST, RIGHT, MASTECTOMY:  - Invasive ductal carcinoma, greater than 5 cm.  - Margins not involved.  - Subareolar breast tissue involved by tumor.  - See oncology table and comment.   D. LYMPH NODE, RIGHT #2, SENTINEL, EXCISION:  - Metastatic carcinoma in one lymph node, 0.5 cm (1/1).   E. LYMPH NODE, RIGHT #1, SENTINEL, EXCISION:  - Metastatic carcinoma in one lymph node,  1.5 cm (1/1).  - Biopsy site and biopsy clip.   F. LYMPH NODE, RIGHT #3, SENTINEL, EXCISION:  - Metastatic carcinoma in one lymph node, 1.5 cm (1/1).   G. LYMPH NODE, RIGHT #4, SENTINEL, EXCISION:  - One lymph node with no metastatic carcinoma (0/1).   H. LYMPH NODE, RIGHT #5, SENTINEL, EXCISION:  - Metastatic carcinoma in one lymph node, 0.5 cm (1/1).   I. LYMPH NODE, RIGHT #6, SENTINEL, EXCISION:  - One lymph node with no metastatic carcinoma (0/1).     A.  LEFT BREAST PROGNOSTIC INDICATOR RESULTS:   Immunohistochemical and morphometric analysis performed manually   The tumor cells are NEGATIVE for Her2 (0).   Estrogen Receptor:       0%, Negative  Progesterone Receptor:   0%, Negative  Proliferation Marker Ki-67:   80%   Comment: The negative hormone receptor study(ies) in the case has an  internal positive control.   Reference Range Estrogen and Progesterone Receptor       Negative  0%       Positive  >1%   All controls stained appropriately.   C. RIGHT BREAST PROGNOSTIC INDICATOR RESULTS:   Immunohistochemical and morphometric analysis performed manually   The tumor cells are NEGATIVE for Her2 (0).  Estrogen Receptor:       0%, Negative  Progesterone Receptor:   0%, Negative  Proliferation Marker Ki-67:   90%   10/01/2020 Cancer Staging   Staging form: Breast, AJCC 8th Edition - Pathologic stage from 10/01/2020: Stage IIIC (pT4b, pN0, cM0, G3, ER-, PR-, HER2-) - Signed by Alla Feeling, NP on 10/24/2020   10/14/2020 Surgery   RIGHT AXILLARY LYMPH NODE DISSECTION by Dr [name]  -1 /13 positive LN for metastatic carcinoma    11/18/2020 - 01/20/2021 Chemotherapy   Adjuvant Xeloda 1500 mg BID days 1-14 q21 days x6 months, starting 11/18/2020 (will reduce to 1500 mg Am/1000 mg Pm M-F during radiation). Stopped 01/20/21 due to liver mets/disease recurrence   12/10/2020 - 01/23/2021 Radiation Therapy   Adjuvant Radiation to right chest wall and axilla with Dr Lisbeth Renshaw  on 12/10/20-01/23/21   12/16/2020 Tumor Marker   CA 27.29 at 582.2 on 11/14/20   Ca 27.29 at 1387.9 on 12/16/20   01/13/2021 Progression   PET IMPRESSION: 1. Interval development of bulky hypermetabolic liver metastases involving both hepatic lobes. 2. Hypermetabolic lesion in the posterior left hemithorax. This is obscured by small pleural effusion on CT imaging and could be a pulmonary nodule or pleural nodule. Given superimposition of the pleural fluid, pleural nodule is favored. 3. Tiny subtle soft tissue nodule anterior paramidline left hemithorax with low level FDG uptake raises concern for a second site of pleural disease in the left chest. 4.  Aortic Atherosclerois (ICD10-170.0)   01/15/2021 Pathology Results   FINAL MICROSCOPIC DIAGNOSIS:   A. LIVER, RIGHT LOBE, NEEDLE CORE BIOPSY:  - Metastatic carcinoma to liver, consistent with patient's clinical  history of primary breast carcinoma.  See comment    PROGNOSTIC INDICATOR RESULTS:   Immunohistochemical and morphometric analysis performed manually   The tumor cells are NEGATIVE for Her2 (1+).   Estrogen Receptor:       POSITIVE, 20%, WEAK STAINING  Progesterone Receptor:   NEGATIVE  Proliferation Marker Ki-67:   30%    01/27/2021 -  Chemotherapy   First-line IV Trodelvy 2 weeks on/1 week off starting 01/27/21      CURRENT THERAPY:  -Adjuvant Radiation toright chest wall and axilla with Dr Lisbeth Renshaw on 12/10/20-01/24/21 -First-line IV Trodelvy 2 weeks on/1 week off starting 01/27/21   INTERVAL HISTORY:  Victoria Coffey is here for a follow up. She presents to the clinic alone. She called her husband to be included in the visit today. She notes when a nurse removed her right chest bandage, she has skin breakdown. She denies abdominal pain or discomfort.     REVIEW OF SYSTEMS:   Constitutional: Denies fevers, chills or abnormal weight loss Eyes: Denies blurriness of vision Ears, nose, mouth, throat, and face: Denies  mucositis or sore throat Respiratory: Denies cough, dyspnea or wheezes Cardiovascular: Denies palpitation, chest discomfort or lower extremity swelling Gastrointestinal:  Denies nausea, heartburn or change in bowel habits Skin: Denies abnormal skin rashes Lymphatics: Denies new lymphadenopathy or easy bruising Neurological:Denies numbness, tingling or new weaknesses Behavioral/Psych: Mood is stable, no new changes  All other systems were reviewed with the patient and are negative.  MEDICAL HISTORY:  Past Medical History:  Diagnosis Date  . Aortic atherosclerosis ([place])   . Breast cancer ([place]) dx'd 1993   left  . Fatty liver   . History of left breast cancer   . HIV infection ([place])   . Hypertension   . Port-A-Cath in place     SURGICAL  HISTORY: Past Surgical History:  Procedure Laterality Date  . AXILLARY LYMPH NODE DISSECTION Right 10/14/2020   Procedure: left axilla removal of two drains right axillary lymph node dissection;  Surgeon: [name], MD;  Location: [facility];  Service: General;  Laterality: Right;  PEC BLOCK, RNFA OR PA  . BREAST BIOPSY Bilateral 03/22/2020   Procedure: BILATERAL BREAST PUNCH BIOPSIES;  Surgeon: [name], MD;  Location: [place] [place];  Service: General;  Laterality: Bilateral;  . BREAST LUMPECTOMY    . CESAREAN SECTION    . left breast lumpectomy  1993  . MASTECTOMY W/ SENTINEL NODE BIOPSY Bilateral 10/01/2020  . MASTECTOMY W/ SENTINEL NODE BIOPSY Bilateral 10/01/2020   Procedure: BILATERAL MASTECTOMIES WITH RIGHT SENTINEL LYMPH NODE BIOPSIES,RIGHT TARGETED RADIOACTIVE SEED LYMPH NODE, LEFT BREAST MODIFIED RADICAL MASTECTOMY ;  Surgeon: [name], MD;  Location: [place];  Service: General;  Laterality: Bilateral;  PEC BLOCK, RNFA  . PORTACATH PLACEMENT Right 03/22/2020   Procedure: INSERTION PORT-A-CATH WITH ULTRASOUND GUIDANCE;  Surgeon: [name], MD;  Location: [place];  Service: General;  Laterality: Right;  . RADIOACTIVE SEED GUIDED AXILLARY  SENTINEL LYMPH NODE Right 10/01/2020   Procedure: RADIOACTIVE SEED GUIDED  TARGETED RIGHT SENTINEL LYMPH NODE BIOPSY;  Surgeon: [name], MD;  Location: [place];  Service: General;  Laterality: Right;  PEC BLOCK, RNFA    I have reviewed the social history and family history with the patient and they are unchanged from previous note.  ALLERGIES:  is allergic to codeine, other, grapefruit bioflavonoid complex, pomegranate [punica], and shellfish allergy.  MEDICATIONS:  Current Outpatient Medications  Medication Sig Dispense Refill  . acetaminophen (TYLENOL) 500 MG tablet Take 1,000 mg by mouth every 6 (six) hours as needed for moderate pain or headache.     . emtricitabine-rilpivir-tenofovir AF (ODEFSEY) 200-25-25 MG TABS tablet Take 1 tablet by mouth daily with breakfast. 30 tablet 11  . hydrochlorothiazide (HYDRODIURIL) 25 MG tablet TAKE 1 TABLET(25 MG) BY MOUTH DAILY (Patient taking differently: Take 25 mg by mouth daily.) 30 tablet 5  . lidocaine-prilocaine (EMLA) cream Apply 1 application topically See admin instructions. Apply 1-2 hours prior to access    . loratadine (CLARITIN) 10 MG tablet Take 10 mg by mouth See admin instructions. Takes 3-4 days before and after treatment    . potassium chloride SA (KLOR-CON) 20 MEQ tablet Take 1 tablet (20 mEq total) by mouth daily. 30 tablet 0  . traMADol (ULTRAM) 50 MG tablet Take 1 tablet (50 mg total) by mouth every 6 (six) hours as needed (mild pain). (Patient not taking: Reported on 01/14/2021) 30 tablet 1   No current facility-administered medications for this visit.    PHYSICAL EXAMINATION: ECOG PERFORMANCE STATUS: 1 - Symptomatic but completely ambulatory  Vitals:   01/20/21 1026  BP: (!) 130/101  Pulse: 98  Resp: 18  Temp: 98 F (36.7 C)  SpO2: 100%   Filed Weights   01/20/21 1026  Weight: 124 lb 3.2 oz (56.3 kg)    GENERAL:alert, no distress and comfortable SKIN: skin color, texture, turgor are normal, no rashes or  significant lesions EYES: normal, Conjunctiva are pink and non-injected, sclera clear  NECK: supple, thyroid normal size, non-tender, without nodularity LYMPH:  no palpable lymphadenopathy in the cervical, axillary  LUNGS: clear to auscultation and percussion with normal breathing effort HEART: regular rate & rhythm and no murmurs and no lower extremity edema ABDOMEN:abdomen soft, non-tender and normal bowel sounds Musculoskeletal:no cyanosis of digits and no clubbing  NEURO:  alert & oriented x 3 with fluent speech, no focal motor/sensory deficits BREAST: S/p b/l mastectomy: surgical incision healed well (+) Skin hyperpigmentation with no skin breakdown from RT except a small area above right [place] from tape last week.    LABORATORY DATA:  I have reviewed the data as listed CBC Latest Ref Rng & Units 01/20/2021 01/15/2021 01/13/2021  WBC 4.0 - 10.5 K/uL 3.8(L) 2.8(L) 3.6(L)  Hemoglobin 12.0 - 15.0 g/dL 11.6(L) 10.7(L) 11.2(L)  Hematocrit 36.0 - 46.0 % 33.6(L) 30.9(L) 32.7(L)  Platelets 150 - 400 K/uL 150 155 187     CMP Latest Ref Rng & Units 01/20/2021 01/13/2021 01/06/2021  Glucose 70 - 99 mg/dL 108(H) 100(H) 99  BUN 8 - 23 mg/dL _0 Creatinine 0.44 - 1.00 mg/dL 1.02(H) 0.91 0.96  Sodium 135 - 145 mmol/L 133(L) 136 135  Potassium 3.5 - 5.1 mmol/L 3.7 3.6 4.4  Chloride 98 - 111 mmol/L 101 104 105  CO2 22 - 32 mmol/L _1 Calcium 8.9 - 10.3 mg/dL 8.9 9.1 9.1  Total Protein 6.5 - 8.1 g/dL 7.4 7.4 7.6  Total Bilirubin 0.3 - 1.2 mg/dL 0.7 0.5 0.4  Alkaline Phos 38 - 126 U/L 776(H) 640(H) 531(H)  AST 15 - 41 U/L 140(H) 113(H) 86(H)  ALT 0 - 44 U/L 87(H) 85(H) 69(H)      RADIOGRAPHIC STUDIES: I have personally reviewed the radiological images as listed and agreed with the findings in the report. No results found.   ASSESSMENT & PLAN:  Victoria Coffey is a 65 y.o. female with   1.InflammatoryLeft breast cancer,pT4N0Mx,ypT4N0,triple negativeAND Right breast cancer  withaxillary node metastasis,pT3N2aM0,ERweakly+,PR/HER2-,ypT3N2a, triple negative,GradeIII, diffuse liver metastasis (ER weakly+/PR-/HER-) in 12/2020 -She was diagnosed with left breast cancer in 02/2020.She presented with inflammatory left breast cancer. 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 -Further work up showed 9cm nodular area in right breast and skin biopsy confirmed cancer involvement -Staging scans in May 2021 were negative for distant metastasis.  -She completed AC X4 and weekly TC 03/22/20-08/23/20.  -Given recentpublish [id number] trial data,I started her on Immunotherapy Keytruda q3weeks for 1 year of treatment beginning 08/23/20. -She underwent b/l mastectomy with Dr [name] on 10/01/20.Shealsounderwent right axillary lymph node dissection on 10/14/2020. -She proceeded with adjuvant radiation with Dr Lisbeth Renshaw  12/10/20-01/24/21. Tolerating well.  -Due tohersignificantresidual diseasein LNs,Istarted her onadjuvant Xelodafor 6 months on 11/18/20 but given concern for cancer recurrence/progression, she was found to have diffuse liver nodules on 01/13/21 PET scan.  -Her liver biopsy from 01/15/21 confirmed metastatic breast cancer with HER/PR negative, ER weakly positive (20%). I discussed with patient that this is most similar to initial right breast caner diagnosis. I have requested [facility] one on her biopsy sample.  -I discussed with liver metastasis her cancer is stage IV and no longer curable but still treatable. Goal is of treatment is palliative to control disease and prolong her life. Given her aggressive disease and higher disease burden, she does have a worse prognosis. She voiced good understanding.  -Her current treatment is no longer controlling or treating her disease, I have stopped Keytruda and will stop Xeloda after she completes radiation this week. -I recommend next line treatment with IV Trodelvy 2 weeks  on/1 week off which is an antibody and chemo conjugate.  She previously had Adriamycin, Cytoxan, carboplatinum and Taxol, and tumor recurred within 6 months of last dose chemo.  Most of her cancer behaves  like triple negative disease. I discussed side effects and gave her more information on print out. She is interested in starting. Plan to start next week. -Chemotherapy consent: Side effects including but does not not limited to, fatigue, nausea, vomiting, diarrhea, hair loss, neuropathy, fluid retention, renal and kidney dysfunction, neutropenic fever, needed for blood transfusion, bleeding, skin rash and allergy reaction, were discussed with patient in great detail. She agrees to proceed. -The goal of therapy is palliative, for disease control and to prolong her life. -F/u on 3/28 with start of treatment.    2.Cytopenias -secondary to chemo -Mild and stableanemia and intermittent leukopenia.Continue monitoring labs weekly -Will monitor on Trodelvy    3. H/o Left breast cancer, Dx in 1993s/p lumpectomy, radiation, chemo, Tamoxifen.Genetic Testing -Given her multiple breast cancers, I recommend genetic testing. She is agreeable.   4. Comorbidities: HIV(+)Dx 1993, HTN -Her HIV has been undetectable and well controlled. She is currently being seen by ID Dr Graylon Good -Ipreviouslyreferredher for Evusheld injection for COVID prevention  5.Mild transaminitis, secondary to liver metastasis  -We will monitor   PLAN: -I reviewed liver biopsy results with patient today, which confirmed metastatic breast cancer -Continue Radiation and Xeloda, complete on 3/25  -Lab, flush, F/u and Trodelvy on 3/28    No problem-specific Assessment & Plan notes found for this encounter.   No orders of the defined types were placed in this encounter.  All questions were answered. The patient knows to call the clinic with any problems, questions or concerns. No barriers to learning was detected. The  total time spent in the appointment was 30 minutes.     [name], MD 01/20/2021   I, Joslyn Devon, am acting as scribe for [name], MD.   I have reviewed the above documentation for accuracy and completeness, and I agree with the above.

## 2021-01-20 ENCOUNTER — Other Ambulatory Visit: Payer: Self-pay

## 2021-01-20 ENCOUNTER — Ambulatory Visit: Payer: BC Managed Care – PPO

## 2021-01-20 ENCOUNTER — Encounter: Payer: Self-pay | Admitting: Hematology

## 2021-01-20 ENCOUNTER — Inpatient Hospital Stay: Payer: BC Managed Care – PPO

## 2021-01-20 ENCOUNTER — Ambulatory Visit
Admission: RE | Admit: 2021-01-20 | Discharge: 2021-01-20 | Disposition: A | Discharge status: Home / Self Care | Payer: BC Managed Care – PPO | Source: Ambulatory Visit | Attending: Radiation Oncology | Admitting: Radiation Oncology

## 2021-01-20 ENCOUNTER — Inpatient Hospital Stay (HOSPITAL_BASED_OUTPATIENT_CLINIC_OR_DEPARTMENT_OTHER): Payer: BC Managed Care – PPO | Admitting: Hematology

## 2021-01-20 VITALS — BP 130/101 | HR 98 | Temp 98.0°F | Resp 18 | Ht 60.0 in | Wt 124.2 lb

## 2021-01-20 DIAGNOSIS — Z170 Estrogen receptor positive status [ER+]: Secondary | ICD-10-CM

## 2021-01-20 DIAGNOSIS — C50412 Malignant neoplasm of upper-outer quadrant of left female breast: Secondary | ICD-10-CM

## 2021-01-20 DIAGNOSIS — Z95828 Presence of other vascular implants and grafts: Secondary | ICD-10-CM

## 2021-01-20 DIAGNOSIS — Z5112 Encounter for antineoplastic immunotherapy: Secondary | ICD-10-CM | POA: Diagnosis not present

## 2021-01-20 DIAGNOSIS — C50411 Malignant neoplasm of upper-outer quadrant of right female breast: Secondary | ICD-10-CM | POA: Diagnosis not present

## 2021-01-20 LAB — CBC WITH DIFFERENTIAL (CANCER CENTER ONLY)
Abs Immature Granulocytes: 0.01 10*3/uL (ref 0.00–0.07)
Basophils Absolute: 0 10*3/uL (ref 0.0–0.1)
Basophils Relative: 0 %
Eosinophils Absolute: 0 10*3/uL (ref 0.0–0.5)
Eosinophils Relative: 0 %
HCT: 33.6 % — ABNORMAL LOW (ref 36.0–46.0)
Hemoglobin: 11.6 g/dL — ABNORMAL LOW (ref 12.0–15.0)
Immature Granulocytes: 0 %
Lymphocytes Relative: 11 %
Lymphs Abs: 0.4 10*3/uL — ABNORMAL LOW (ref 0.7–4.0)
MCH: 31.1 pg (ref 26.0–34.0)
MCHC: 34.5 g/dL (ref 30.0–36.0)
MCV: 90.1 fL (ref 80.0–100.0)
Monocytes Absolute: 0.4 10*3/uL (ref 0.1–1.0)
Monocytes Relative: 10 %
Neutro Abs: 2.9 10*3/uL (ref 1.7–7.7)
Neutrophils Relative %: 79 %
Platelet Count: 150 10*3/uL (ref 150–400)
RBC: 3.73 MIL/uL — ABNORMAL LOW (ref 3.87–5.11)
RDW: 21.5 % — ABNORMAL HIGH (ref 11.5–15.5)
WBC Count: 3.8 10*3/uL — ABNORMAL LOW (ref 4.0–10.5)
nRBC: 0 % (ref 0.0–0.2)

## 2021-01-20 LAB — CMP (CANCER CENTER ONLY)
ALT: 87 U/L — ABNORMAL HIGH (ref 0–44)
AST: 140 U/L — ABNORMAL HIGH (ref 15–41)
Albumin: 3.2 g/dL — ABNORMAL LOW (ref 3.5–5.0)
Alkaline Phosphatase: 776 U/L — ABNORMAL HIGH (ref 38–126)
Anion gap: 10 (ref 5–15)
BUN: 14 mg/dL (ref 8–23)
CO2: 22 mmol/L (ref 22–32)
Calcium: 8.9 mg/dL (ref 8.9–10.3)
Chloride: 101 mmol/L (ref 98–111)
Creatinine: 1.02 mg/dL — ABNORMAL HIGH (ref 0.44–1.00)
GFR, Estimated: >60 mL/min (ref >60)
Glucose, Bld: 108 mg/dL — ABNORMAL HIGH (ref 70–99)
Potassium: 3.7 mmol/L (ref 3.5–5.1)
Sodium: 133 mmol/L — ABNORMAL LOW (ref 135–145)
Total Bilirubin: 0.7 mg/dL (ref 0.3–1.2)
Total Protein: 7.4 g/dL (ref 6.5–8.1)

## 2021-01-20 MED ORDER — SODIUM CHLORIDE 0.9% FLUSH
10.0000 mL | Freq: Once | INTRAVENOUS | Status: AC
  Administered 2021-01-20: 10 mL
  Filled 2021-01-20: qty 10

## 2021-01-20 MED ORDER — HEPARIN SOD (PORK) LOCK FLUSH 100 UNIT/ML IV SOLN
500.0000 [IU] | Freq: Once | INTRAVENOUS | Status: AC
  Administered 2021-01-20: 500 [IU]
  Filled 2021-01-20: qty 5

## 2021-01-20 NOTE — Patient Instructions (Signed)
Implanted Port Insertion, Care After This sheet gives you information about how to care for yourself after your procedure. Your health care provider may also give you more specific instructions. If you have problems or questions, contact your health care provider. What can I expect after the procedure? After the procedure, it is common to have:  Discomfort at the port insertion site.  Bruising on the skin over the port. This should improve over 3-4 days. Follow these instructions at home: Port care  After your port is placed, you will get a manufacturer's information card. The card has information about your port. Keep this card with you at all times.  Take care of the port as told by your health care provider. Ask your health care provider if you or a family member can get training for taking care of the port at home. A home health care nurse may also take care of the port.  Make sure to remember what type of port you have. Incision care  Follow instructions from your health care provider about how to take care of your port insertion site. Make sure you: ? Wash your hands with soap and water before and after you change your bandage (dressing). If soap and water are not available, use hand sanitizer. ? Change your dressing as told by your health care provider. ? Leave stitches (sutures), skin glue, or adhesive strips in place. These skin closures may need to stay in place for 2 weeks or longer. If adhesive strip edges start to loosen and curl up, you may trim the loose edges. Do not remove adhesive strips completely unless your health care provider tells you to do that.  Check your port insertion site every day for signs of infection. Check for: ? Redness, swelling, or pain. ? Fluid or blood. ? Warmth. ? Pus or a bad smell.      Activity  Return to your normal activities as told by your health care provider. Ask your health care provider what activities are safe for you.  Do not  lift anything that is heavier than 10 lb (4.5 kg), or the limit that you are told, until your health care provider says that it is safe. General instructions  Take over-the-counter and prescription medicines only as told by your health care provider.  Do not take baths, swim, or use a hot tub until your health care provider approves. Ask your health care provider if you may take showers. You may only be allowed to take sponge baths.  Do not drive for 24 hours if you were given a sedative during your procedure.  Wear a medical alert bracelet in case of an emergency. This will tell any health care providers that you have a port.  Keep all follow-up visits as told by your health care provider. This is important. Contact a health care provider if:  You cannot flush your port with saline as directed, or you cannot draw blood from the port.  You have a fever or chills.  You have redness, swelling, or pain around your port insertion site.  You have fluid or blood coming from your port insertion site.  Your port insertion site feels warm to the touch.  You have pus or a bad smell coming from the port insertion site. Get help right away if:  You have chest pain or shortness of breath.  You have bleeding from your port that you cannot control. Summary  Take care of the port as told by your   health care provider. Keep the manufacturer's information card with you at all times.  Change your dressing as told by your health care provider.  Contact a health care provider if you have a fever or chills or if you have redness, swelling, or pain around your port insertion site.  Keep all follow-up visits as told by your health care provider. This information is not intended to replace advice given to you by your health care provider. Make sure you discuss any questions you have with your health care provider. Document Revised: 05/17/2018 Document Reviewed: 05/17/2018 Elsevier Patient Education   2021 Elsevier Inc.  

## 2021-01-21 ENCOUNTER — Ambulatory Visit
Admission: RE | Admit: 2021-01-21 | Discharge: 2021-01-21 | Disposition: A | Discharge status: Home / Self Care | Payer: BC Managed Care – PPO | Source: Ambulatory Visit | Attending: Radiation Oncology | Admitting: Radiation Oncology

## 2021-01-21 DIAGNOSIS — C50412 Malignant neoplasm of upper-outer quadrant of left female breast: Secondary | ICD-10-CM | POA: Diagnosis not present

## 2021-01-22 ENCOUNTER — Telehealth: Payer: Self-pay | Admitting: Hematology

## 2021-01-22 ENCOUNTER — Ambulatory Visit: Payer: BC Managed Care – PPO

## 2021-01-22 ENCOUNTER — Ambulatory Visit
Admission: RE | Admit: 2021-01-22 | Discharge: 2021-01-22 | Disposition: A | Discharge status: Home / Self Care | Payer: BC Managed Care – PPO | Source: Ambulatory Visit | Attending: Radiation Oncology | Admitting: Radiation Oncology

## 2021-01-22 ENCOUNTER — Encounter: Payer: Self-pay | Admitting: *Deleted

## 2021-01-22 ENCOUNTER — Other Ambulatory Visit: Payer: Self-pay

## 2021-01-22 DIAGNOSIS — C50412 Malignant neoplasm of upper-outer quadrant of left female breast: Secondary | ICD-10-CM | POA: Diagnosis not present

## 2021-01-22 NOTE — Telephone Encounter (Signed)
Left message with follow-up appointments per 3/21 los. Gave option to call back to reschedule if needed.

## 2021-01-23 ENCOUNTER — Ambulatory Visit
Admission: RE | Admit: 2021-01-23 | Discharge: 2021-01-23 | Disposition: A | Discharge status: Home / Self Care | Payer: BC Managed Care – PPO | Source: Ambulatory Visit | Attending: Radiation Oncology | Admitting: Radiation Oncology

## 2021-01-23 ENCOUNTER — Ambulatory Visit: Payer: BC Managed Care – PPO

## 2021-01-23 DIAGNOSIS — C50412 Malignant neoplasm of upper-outer quadrant of left female breast: Secondary | ICD-10-CM | POA: Diagnosis not present

## 2021-01-24 ENCOUNTER — Encounter: Payer: Self-pay | Admitting: Radiation Oncology

## 2021-01-24 ENCOUNTER — Other Ambulatory Visit: Payer: Self-pay

## 2021-01-24 ENCOUNTER — Ambulatory Visit
Admission: RE | Admit: 2021-01-24 | Discharge: 2021-01-24 | Disposition: A | Discharge status: Home / Self Care | Payer: BC Managed Care – PPO | Source: Ambulatory Visit | Attending: Radiation Oncology | Admitting: Radiation Oncology

## 2021-01-24 DIAGNOSIS — C50412 Malignant neoplasm of upper-outer quadrant of left female breast: Secondary | ICD-10-CM | POA: Diagnosis not present

## 2021-01-24 NOTE — Progress Notes (Signed)
[place]   [phone] [phone]   Clinic Follow up Note   Patient Care Team: [name], MD as PCP - General (Infectious Diseases) [name], MD as PCP - Infectious Diseases (Infectious Diseases) [name], MD as Consulting Physician (General Surgery) [name], MD as Consulting Physician (Hematology) [name], MD as Consulting Physician (Radiation Oncology) [name], RN as Oncology Nurse Navigator [name], RN as Oncology Nurse Navigator  Date of Service:  01/27/2021  CHIEF COMPLAINT: F/u ofbilateralbreast cancer  SUMMARY OF ONCOLOGIC HISTORY: Oncology History Overview Note  Cancer Staging Bilateral breast cancer [facility]) Staging form: Breast, AJCC 8th Edition - Pathologic stage from 10/01/2020: Stage IIIC (pT3, pN2a, cM0, G3, ER-, PR-, HER2-) - Signed by Alla Feeling, NP on 10/24/2020  Malignant neoplasm of upper-outer quadrant of left breast in female, estrogen receptor positive ([place]) Staging form: Breast, AJCC 8th Edition - Clinical stage from 03/01/2020: Stage IIIC (cT4, cN0, cM0, G3, ER+, PR-, HER2-) - Signed by [name], MD on 08/22/2020 - Pathologic stage from 10/01/2020: Stage IIIC (pT4b, pN0, cM0, G3, ER-, PR-, HER2-) - Signed by Alla Feeling, NP on 10/24/2020    Malignant neoplasm of upper-outer quadrant of left breast in female, estrogen receptor negative ([place])  02/28/2020 Mammogram   Diagnostic Mammogram 02/28/20  IMPRESSION The 3.9 cm ill defined mass in the left breast posterior depth central 4.3cm to the nipple seen on the mediolateral oblique view onlt with associated difssue skin thickening is highly suspicious for malignancy.    03/01/2020 Cancer Staging   Staging form: Breast, AJCC 8th Edition - Clinical stage from 03/01/2020: Stage IIIC (cT4, cN0, cM0, G3, ER+, PR-, HER2-) - Signed by [name], MD on 08/22/2020   03/01/2020 Initial Biopsy   Diagnosis 03/01/20  1. Breast, left,  needle core biopsy, 9 o'clock, 3-4cmfn - INVASIVE DUCTAL CARCINOMA. SEE NOTE 2. Lymph node, needle/core biopsy, right axilla - INVASIVE DUCTAL CARCINOMA. SEE NOTE Diagnosis Note 1. Invasive carcinoma measures 1.5 cm in greatest linear dimension and appears grade 3. Dr. [name] reviewed the case and concurs with the diagnosis. A breast prognostic profile (ER, PR, Ki-67 and HER2) is pending and will be reported in an addendum. Dr. [name] was notified on 03/04/2020. 2. Carcinoma measures 0.6 cm in greatest linear dimension and appears grade 3. Lymphoid tissue is not identified - this may represent a completely replaced lymph node. A breast prognostic profile (ER, PR, Ki-67 and HER2) is pending and will be reported in an addendum. Dr. [name] reviewed the case and concurs with the diagnosis. Dr. [name] was notified on 03/04/2020.   03/01/2020 Receptors her2   1. PROGNOSTIC INDICATORS Results: IMMUNOHISTOCHEMICAL AND MORPHOMETRIC ANALYSIS PERFORMED MANUALLY The tumor cells are NEGATIVE for Her2 (0). Estrogen Receptor: 20%, POSITIVE, WEAK STAINING INTENSITY Progesterone Receptor: 0%, NEGATIVE Proliferation Marker Ki67: 40% COMMENT: The negative hormone receptor study(ies) in this case has an internal positive control.    2. PROGNOSTIC INDICATORS Results: IMMUNOHISTOCHEMICAL AND MORPHOMETRIC ANALYSIS PERFORMED MANUALLY The tumor cells are NEGATIVE for Her2 (1+). Estrogen Receptor: 10%, POSITIVE, WEAK STAINING INTENSITY Progesterone Receptor: 0%, NEGATIVE Proliferation Marker Ki67: 40% COMMENT: The negative hormone receptor study(ies) in this case has no internal positive control.   03/05/2020 Initial Diagnosis   Malignant neoplasm of upper-outer quadrant of left breast in female, estrogen receptor positive ([place])   03/18/2020 Breast MRI   IMPRESSION: 1. 7 x 9 x 6 cm area of suspicious non masslike enhancement within the central/UPPER RIGHT breast  with anterior skin thickening.  Given biopsy-proven metastatic RIGHT axillary lymph node, tissue sampling of the anterior and posterior aspects of this non masslike RIGHT breast enhancement is recommended to exclude malignancy. 2. 7 x 6 x 5 cm diffuse masslike and nonmasslike enhancement within the majority of the remaining LEFT breast with diffuse LEFT breast skin thickening, compatible with malignancy. Abnormal enhancement is noted along the anterior aspect of the LEFT pectoralis muscle. 3. Three abnormal RIGHT axillary lymph nodes, 1 which is biopsy proven to be metastatic disease. No abnormal LEFT axillary lymph nodes identified.   03/20/2020 Echocardiogram   Baseline ECHO  IMPRESSIONS     1. The patient was reported to be in pain during the study and not able  to participate in this study. All that can be said is that the LV and RV  function are grossly normal. Would recommend to repeat this study when/if  the patient is able to tolerate  the exam.   2. Left ventricular ejection fraction, by estimation, is 60 to 65%. The  left ventricle has normal function. Left ventricular endocardial border  not optimally defined to evaluate regional wall motion. Left ventricular  diastolic function could not be  evaluated.   3. Right ventricular systolic function is normal. The right ventricular  size is normal.   4. The mitral valve was not well visualized. No evidence of mitral valve  regurgitation.   5. The aortic valve was not well visualized. Aortic valve regurgitation  is not visualized.   6. The inferior vena cava is normal in size with greater than 50%  respiratory variability, suggesting right atrial pressure of 3 mmHg.    03/20/2020 Imaging   CT CAP W contrast  IMPRESSION: 1. Small right axillary/subpectoral lymph nodes. Difficult to exclude metastatic disease. 2. Subtle 6 mm low-attenuation lesion in the dome of the liver, not well seen previously. Lesion is too small to characterize. Further evaluation  could be performed with MR abdomen without and with contrast, as clinically indicated. 3. Hepatic steatosis. 4. Aortic atherosclerosis (ICD10-I70.0). Coronary artery calcification.     03/20/2020 Imaging   Whole body bone scan  IMPRESSION: 1. Increased activity noted over the right breast, possibly related to the patient's right breast cancer.   2. Punctate area of increased activity over the left maxillary region, most likely related to sinus disease or dental disease. Increased activity noted over the left mandible also possibly related to dental disease. No other focal bony abnormalities to suggest metastatic disease identified.   03/22/2020 Procedure   PAC placement by Dr [name]    03/22/2020 - 08/23/2020 Chemotherapy   AC q2weeks for 4 cycles 03/22/20-05/03/20 followed by weekly Taxol and Carboplatin q3weeks for 12 weeks starting 05/17/20.                  Week 2 Taxol reduced to half dose and CT was dose reduced starting with week 4 due to thrombocytopenia. Given pancytopenia, will change Carboplatin to 50% dose weekly with Taxol 22m/m2 starting with week 7 (06/13/20). Will hold Carboplatin as needed based on labs. Carboplatin increased to 80% starting with week 8. Carboplatin dose reduced for C11 and held with C12. C12 Taxol postponed to 08/23/20   03/22/2020 Pathology Results   FINAL MICROSCOPIC DIAGNOSIS:   A. BREAST, RIGHT, PUNCH BIOPSY:  - Carcinoma involving dermal lymphatics.  - See comment.   B. BREAST, LEFT, PUNCH BIOPSY:  - Carcinoma involving dermal lymphatics.  - See comment.   COMMENT:  The  morphology is compatible with ductal breast carcinoma.    ADDENDUM:   PROGNOSTIC INDICATOR RESULTS:   Immunohistochemical and morphometric analysis performed manually   The tumor cells are NEGATIVE for Her2 (0%).   Estrogen Receptor:       NEGATIVE, 0%  Progesterone Receptor:   NEGATIVE, 0%    08/13/2020 Breast MRI   IMPRESSION: 1. Interval significant response to  treatment of the diffuse non-mass enhancement involving the UPPER RIGHT breast. There is a solitary residual 5 mm indeterminate mass in the UPPER OUTER QUADRANT at MIDDLE depth with plateau kinetics. 2. Minimal response to treatment of the diffuse non-mass enhancement throughout the LEFT breast. 3. Resolution of enhancement of the skin and nipple-areolar complex of the RIGHT breast. 4. Near complete resolution of enhancement of the skin and nipple-areolar complex of the LEFT breast. Minimal residual nipple-areola complex enhancement persists. 5. The 3 previously identified pathologic RIGHT axillary lymph nodes are now normal in appearance. There is no pathologic lymphadenopathy currently.   08/23/2020 - 01/13/2021 Chemotherapy   Immunotherapy Keytruda q3weeks starting 08/23/20 for 1 year treatment. D/c on 01/13/21 given disease recurrence/progression   10/01/2020 Surgery   MASTECTOMIES WITH RIGHT SENTINEL LYMPH NODE BIOPSIES,RIGHT TARGETED RADIOACTIVE SEED LYMPH NODE, LEFT BREAST MODIFIED RADICAL MASTECTOMY  and RADIOACTIVE SEED GUIDED  TARGETED RIGHT SENTINEL LYMPH NODE BIOPSY by Dr [name]     10/01/2020 Pathology Results   FINAL MICROSCOPIC DIAGNOSIS:   A. BREAST, LEFT, MASTECTOMY:  - Invasive ductal carcinoma, 7 cm.  - Anterior and posterior soft tissue margins not involved by tumor.  - Foci of skin involvement by tumor, see comment.  - Nipple involvement by tumor.  - See oncology table and comment.   B. LYMPH NODE, LEFT AXILLARY, CONTENTS:  - One lymph node with no metastatic carcinoma (0/1).   C. BREAST, RIGHT, MASTECTOMY:  - Invasive ductal carcinoma, greater than 5 cm.  - Margins not involved.  - Subareolar breast tissue involved by tumor.  - See oncology table and comment.   D. LYMPH NODE, RIGHT #2, SENTINEL, EXCISION:  - Metastatic carcinoma in one lymph node, 0.5 cm (1/1).   E. LYMPH NODE, RIGHT #1, SENTINEL, EXCISION:  - Metastatic carcinoma in one lymph node,  1.5 cm (1/1).  - Biopsy site and biopsy clip.   F. LYMPH NODE, RIGHT #3, SENTINEL, EXCISION:  - Metastatic carcinoma in one lymph node, 1.5 cm (1/1).   G. LYMPH NODE, RIGHT #4, SENTINEL, EXCISION:  - One lymph node with no metastatic carcinoma (0/1).   H. LYMPH NODE, RIGHT #5, SENTINEL, EXCISION:  - Metastatic carcinoma in one lymph node, 0.5 cm (1/1).   I. LYMPH NODE, RIGHT #6, SENTINEL, EXCISION:  - One lymph node with no metastatic carcinoma (0/1).     A.  LEFT BREAST PROGNOSTIC INDICATOR RESULTS:   Immunohistochemical and morphometric analysis performed manually   The tumor cells are NEGATIVE for Her2 (0).   Estrogen Receptor:       0%, Negative  Progesterone Receptor:   0%, Negative  Proliferation Marker Ki-67:   80%   Comment: The negative hormone receptor study(ies) in the case has an  internal positive control.   Reference Range Estrogen and Progesterone Receptor       Negative  0%       Positive  >1%   All controls stained appropriately.   C. RIGHT BREAST PROGNOSTIC INDICATOR RESULTS:   Immunohistochemical and morphometric analysis performed manually   The tumor cells are NEGATIVE for Her2 (0).  Estrogen Receptor:       0%, Negative  Progesterone Receptor:   0%, Negative  Proliferation Marker Ki-67:   90%   10/01/2020 Cancer Staging   Staging form: Breast, AJCC 8th Edition - Pathologic stage from 10/01/2020: Stage IIIC (pT4b, pN0, cM0, G3, ER-, PR-, HER2-) - Signed by Alla Feeling, NP on 10/24/2020   10/14/2020 Surgery   RIGHT AXILLARY LYMPH NODE DISSECTION by Dr [name]  -1 /13 positive LN for metastatic carcinoma    11/18/2020 - 01/20/2021 Chemotherapy   Adjuvant Xeloda 1500 mg BID days 1-14 q21 days x6 months, starting 11/18/2020 (will reduce to 1500 mg Am/1000 mg Pm M-F during radiation). Stopped 01/20/21 due to liver mets/disease recurrence   12/10/2020 - 01/24/2021 Radiation Therapy   Adjuvant Radiation to right chest wall and axilla with Dr Lisbeth Renshaw  on 12/10/20-01/24/21   12/16/2020 Tumor Marker   CA 27.29 at 582.2 on 11/14/20   Ca 27.29 at 1387.9 on 12/16/20   01/13/2021 Progression   PET IMPRESSION: 1. Interval development of bulky hypermetabolic liver metastases involving both hepatic lobes. 2. Hypermetabolic lesion in the posterior left hemithorax. This is obscured by small pleural effusion on CT imaging and could be a pulmonary nodule or pleural nodule. Given superimposition of the pleural fluid, pleural nodule is favored. 3. Tiny subtle soft tissue nodule anterior paramidline left hemithorax with low level FDG uptake raises concern for a second site of pleural disease in the left chest. 4.  Aortic Atherosclerois (ICD10-170.0)   01/15/2021 Pathology Results   FINAL MICROSCOPIC DIAGNOSIS:   A. LIVER, RIGHT LOBE, NEEDLE CORE BIOPSY:  - Metastatic carcinoma to liver, consistent with patient's clinical  history of primary breast carcinoma.  See comment    PROGNOSTIC INDICATOR RESULTS:   Immunohistochemical and morphometric analysis performed manually   The tumor cells are NEGATIVE for Her2 (1+).   Estrogen Receptor:       POSITIVE, 20%, WEAK STAINING  Progesterone Receptor:   NEGATIVE  Proliferation Marker Ki-67:   30%    01/27/2021 -  Chemotherapy   First-line IV Trodelvy 2 weeks on/1 week off starting 01/27/21      CURRENT THERAPY:  First-line IV Trodelvy 2 weeks on/1 week off starting 01/27/21  INTERVAL HISTORY:  Victoria Coffey is here for a follow up. She presents to the clinic alone. She notes her fatigue from Radiation is manageable. She notes stable skin breakdown near port. We discussed how to manage her skin breakdown. She notes she is nervous about her first-line treatment and was nauseous yesterday.     REVIEW OF SYSTEMS:   Constitutional: Denies fevers, chills or abnormal weight loss (+) Fatigue  Eyes: Denies blurriness of vision Ears, nose, mouth, throat, and face: Denies mucositis or sore  throat Respiratory: Denies cough, dyspnea or wheezes Cardiovascular: Denies palpitation, chest discomfort or lower extremity swelling Gastrointestinal:  Denies nausea, heartburn or change in bowel habits Skin: Denies abnormal skin rashes Lymphatics: Denies new lymphadenopathy or easy bruising Neurological:Denies numbness, tingling or new weaknesses Behavioral/Psych: Mood is stable, no new changes (+) Nervous  All other systems were reviewed with the patient and are negative.  MEDICAL HISTORY:  Past Medical History:  Diagnosis Date  . Aortic atherosclerosis ([place])   . Breast cancer ([place]) dx'd 1993   left  . Fatty liver   . History of left breast cancer   . HIV infection ([place])   . Hypertension   . Port-A-Cath in place     SURGICAL HISTORY: Past Surgical  History:  Procedure Laterality Date  . AXILLARY LYMPH NODE DISSECTION Right 10/14/2020   Procedure: left axilla removal of two drains right axillary lymph node dissection;  Surgeon: [name], MD;  Location: [facility];  Service: General;  Laterality: Right;  PEC BLOCK, RNFA OR PA  . BREAST BIOPSY Bilateral 03/22/2020   Procedure: BILATERAL BREAST PUNCH BIOPSIES;  Surgeon: [name], MD;  Location: [facility];  Service: General;  Laterality: Bilateral;  . BREAST LUMPECTOMY    . CESAREAN SECTION    . left breast lumpectomy  1993  . MASTECTOMY W/ SENTINEL NODE BIOPSY Bilateral 10/01/2020  . MASTECTOMY W/ SENTINEL NODE BIOPSY Bilateral 10/01/2020   Procedure: BILATERAL MASTECTOMIES WITH RIGHT SENTINEL LYMPH NODE BIOPSIES,RIGHT TARGETED RADIOACTIVE SEED LYMPH NODE, LEFT BREAST MODIFIED RADICAL MASTECTOMY ;  Surgeon: [name], MD;  Location: [facility];  Service: General;  Laterality: Bilateral;  PEC BLOCK, RNFA  . PORTACATH PLACEMENT Right 03/22/2020   Procedure: INSERTION PORT-A-CATH WITH ULTRASOUND GUIDANCE;  Surgeon: [name], MD;  Location: [place];  Service: General;  Laterality: Right;  . RADIOACTIVE SEED GUIDED AXILLARY SENTINEL  LYMPH NODE Right 10/01/2020   Procedure: RADIOACTIVE SEED GUIDED  TARGETED RIGHT SENTINEL LYMPH NODE BIOPSY;  Surgeon: [name], MD;  Location: [place];  Service: General;  Laterality: Right;  PEC BLOCK, RNFA    I have reviewed the social history and family history with the patient and they are unchanged from previous note.  ALLERGIES:  is allergic to codeine, other, grapefruit bioflavonoid complex, pomegranate [punica], and shellfish allergy.  MEDICATIONS:  Current Outpatient Medications  Medication Sig Dispense Refill  . acetaminophen (TYLENOL) 500 MG tablet Take 1,000 mg by mouth every 6 (six) hours as needed for moderate pain or headache.     . emtricitabine-rilpivir-tenofovir AF (ODEFSEY) 200-25-25 MG TABS tablet Take 1 tablet by mouth daily with breakfast. 30 tablet 11  . hydrochlorothiazide (HYDRODIURIL) 25 MG tablet TAKE 1 TABLET(25 MG) BY MOUTH DAILY (Patient taking differently: Take 25 mg by mouth daily.) 30 tablet 5  . lidocaine-prilocaine (EMLA) cream Apply 1 application topically See admin instructions. Apply 1-2 hours prior to access    . loratadine (CLARITIN) 10 MG tablet Take 10 mg by mouth See admin instructions. Takes 3-4 days before and after treatment    . potassium chloride SA (KLOR-CON) 20 MEQ tablet Take 1 tablet (20 mEq total) by mouth daily. 30 tablet 0  . traMADol (ULTRAM) 50 MG tablet Take 1 tablet (50 mg total) by mouth every 6 (six) hours as needed (mild pain). (Patient not taking: Reported on 01/14/2021) 30 tablet 1   No current facility-administered medications for this visit.   Facility-Administered Medications Ordered in Other Visits  Medication Dose Route Frequency Provider Last Rate Last Admin  . atropine injection 0.5 mg  0.5 mg Intravenous Once PRN [name], MD      . dexamethasone (DECADRON) 10 mg in sodium chloride 0.9 % 50 mL IVPB  10 mg Intravenous Once [name], MD      . fosaprepitant (EMEND) 150 mg in sodium chloride 0.9 % 145 mL IVPB  150 mg  Intravenous Once [name], MD      . heparin lock flush 100 unit/mL  500 Units Intracatheter Once PRN [name], MD      . sacituzumab govitecan-hziy (TRODELVY) 540 mg in sodium chloride 0.9 % 250 mL (1.7763 mg/mL) chemo infusion  10 mg/kg (Treatment Plan Recorded) Intravenous Once [name], MD      . sodium  chloride flush (NS) 0.9 % injection 10 mL  10 mL Intracatheter PRN [name], MD        PHYSICAL EXAMINATION: ECOG PERFORMANCE STATUS: 1 - Symptomatic but completely ambulatory Blood pressure 112/81, heart rate 96, respirate 18, temperature 36.8, pulse ox 100% on room air GENERAL:alert, no distress and comfortable Musculoskeletal:no cyanosis of digits and no clubbing  NEURO: alert & oriented x 3 with fluent speech, no focal motor/sensory deficits BREAST: S/p b/l mastectomy: surgical incision healed well (+) Skin hyperpigmentation with no skin breakdown from RT except a small area above right [place] from tape last week.    LABORATORY DATA:  I have reviewed the data as listed CBC Latest Ref Rng & Units 01/27/2021 01/20/2021 01/15/2021  WBC 4.0 - 10.5 K/uL 4.0 3.8(L) 2.8(L)  Hemoglobin 12.0 - 15.0 g/dL 11.1(L) 11.6(L) 10.7(L)  Hematocrit 36.0 - 46.0 % 31.1(L) 33.6(L) 30.9(L)  Platelets 150 - 400 K/uL 121(L) 150 155     CMP Latest Ref Rng & Units 01/27/2021 01/20/2021 01/13/2021  Glucose 70 - 99 mg/dL 106(H) 108(H) 100(H)  BUN 8 - 23 mg/dL _0 Creatinine 0.44 - 1.00 mg/dL 1.01(H) 1.02(H) 0.91  Sodium 135 - 145 mmol/L 133(L) 133(L) 136  Potassium 3.5 - 5.1 mmol/L 4.7 3.7 3.6  Chloride 98 - 111 mmol/L 101 101 104  CO2 22 - 32 mmol/L 20(L) 22 22  Calcium 8.9 - 10.3 mg/dL 8.4(L) 8.9 9.1  Total Protein 6.5 - 8.1 g/dL 6.9 7.4 7.4  Total Bilirubin 0.3 - 1.2 mg/dL 0.7 0.7 0.5  Alkaline Phos 38 - 126 U/L 864(H) 776(H) 640(H)  AST 15 - 41 U/L 187(HH) 140(H) 113(H)  ALT 0 - 44 U/L 112(H) 87(H) 85(H)      RADIOGRAPHIC STUDIES: I have personally reviewed the radiological images as listed  and agreed with the findings in the report. No results found.   ASSESSMENT & PLAN:  Victoria Coffey is a 65 y.o. female with    1.InflammatoryLeft breast cancer,pT4N0Mx,ypT4N0,triple negativeAND Right breast cancer withaxillary node metastasis,pT3N2aM0,ERweakly+,PR/HER2-,ypT3N2a, triple negative,GradeIII, diffuse liver metastasis (ER weakly+/PR-/HER-) in 12/2020 -She was diagnosed with left breast cancer in 02/2020.She presented with inflammatory left breast cancer. Biopsy of left breast mass and right axillary LN were positive for Invasive ductal carcinoma.Further work up showed 9cm nodular area in right breast and skin biopsy confirmed cancer involvement. Staging scansin May 2021were negative for distant metastasis. -She completed AC X4 and weekly TC 03/22/20-08/23/20. She underwent b/l mastectomy with Dr [name] on 10/01/20.Shealsounderwent right axillary lymph node dissection on 10/14/2020 and completed Adjuvant radiation with Dr Lisbeth Renshaw  12/10/20-01/24/21.  -Given recentpublish [employer] 522 trial data,I started her on Immunotherapy Keytruda q3weeks for 1 year of treatment beginning 08/23/20.Due tohersignificantresidual diseasein LNs,Istarted her onadjuvant Xelodafor 6 months on 11/18/20. Unfortunately she progressed/recurred with diffuse liver mets on 01/13/21 PET and 01/15/21 Liver biopsy (Her/PR negative/ER 20% weakly positive). Keytruda/Xeloda d/c.  -I have requested [facility] one on her biopsy sample.  -I discussed with liver metastasis her cancer is stage IV and no longer curable but still treatable. Goal is of treatment is palliative to control disease and prolong her life. Given her aggressive disease and higher disease burden, she does have a worse prognosis. She voiced good understanding.  -Given most of her cancer behaves like triple negative disease, I recommended First-line treatment with IV Trodelvy 2 weeks on/1 week off which is an antibody and chemo conjugate.  Chemo consent obtained, plan to start today (01/27/21). The goal of therapy is  palliative, for disease control and to prolong her life. -Labs reviewed, adequate to proceed with C1D1 Ivette Loyal today.  -I will have her nurse picture of her latest skin breakdown near her [place]. She can continue keeping her skin dry and clean and use neosporin around her wound and use cling wrap only over her port.  -F/u next week for toxicity checkand D8 treatment.   2.Cytopenias -secondary to chemo -Mild and stableanemia and intermittent leukopenia.Continue monitoring labs weekly -Will monitor on Trodelvy   3. H/o Left breast cancer, Dx in 1993s/p lumpectomy, radiation, chemo, Tamoxifen.Genetic Testing -Given her multiple breast cancers, I recommend genetic testing. She is agreeable.  4. Comorbidities: HIV(+)Dx 1993, HTN -Her HIV has been undetectable and well controlled. She is currently being seen by ID Dr Graylon Good -Ipreviouslyreferredher for Evusheld injection for COVID prevention  5.Mild transaminitis, secondary to liver metastasis  -We will monitor -AST has significantly increased to 187, ALT 112, Alk Phos 864. Tbili normal (01/27/21)  PLAN: -Labs reviewed and adequate to proceed with C1D1 Trodelvy today  -Lab, flush, F/u and Trodelvy next week. -Refer to Dietician for her questions.      No problem-specific Assessment & Plan notes found for this encounter.   Orders Placed This Encounter  Procedures  . Ambulatory Referral to [facility]    Referral Priority:   Routine    Referral Type:   Consultation    Referral Reason:   Specialty Services Required    Requested Specialty:   Oncology    Number of Visits Requested:   1   All questions were answered. The patient knows to call the clinic with any problems, questions or concerns. No barriers to learning was detected. The total time spent in the appointment was 30 minutes.     [name], MD 01/27/2021   I, Joslyn Devon, am  acting as scribe for [name], MD.   I have reviewed the above documentation for accuracy and completeness, and I agree with the above.

## 2021-01-27 ENCOUNTER — Inpatient Hospital Stay: Payer: BC Managed Care – PPO

## 2021-01-27 ENCOUNTER — Ambulatory Visit: Payer: BC Managed Care – PPO

## 2021-01-27 ENCOUNTER — Other Ambulatory Visit: Payer: BC Managed Care – PPO

## 2021-01-27 ENCOUNTER — Other Ambulatory Visit: Payer: Self-pay

## 2021-01-27 ENCOUNTER — Encounter: Payer: Self-pay | Admitting: Hematology

## 2021-01-27 ENCOUNTER — Inpatient Hospital Stay (HOSPITAL_BASED_OUTPATIENT_CLINIC_OR_DEPARTMENT_OTHER): Payer: BC Managed Care – PPO | Admitting: Hematology

## 2021-01-27 VITALS — BP 133/101 | HR 87 | Temp 97.6°F | Resp 18

## 2021-01-27 DIAGNOSIS — Z170 Estrogen receptor positive status [ER+]: Secondary | ICD-10-CM

## 2021-01-27 DIAGNOSIS — C50412 Malignant neoplasm of upper-outer quadrant of left female breast: Secondary | ICD-10-CM

## 2021-01-27 DIAGNOSIS — Z95828 Presence of other vascular implants and grafts: Secondary | ICD-10-CM

## 2021-01-27 DIAGNOSIS — Z5112 Encounter for antineoplastic immunotherapy: Secondary | ICD-10-CM | POA: Diagnosis not present

## 2021-01-27 DIAGNOSIS — C50411 Malignant neoplasm of upper-outer quadrant of right female breast: Secondary | ICD-10-CM | POA: Diagnosis not present

## 2021-01-27 LAB — CBC WITH DIFFERENTIAL (CANCER CENTER ONLY)
Abs Immature Granulocytes: 0.02 10*3/uL (ref 0.00–0.07)
Basophils Absolute: 0 10*3/uL (ref 0.0–0.1)
Basophils Relative: 0 %
Eosinophils Absolute: 0 10*3/uL (ref 0.0–0.5)
Eosinophils Relative: 0 %
HCT: 31.1 % — ABNORMAL LOW (ref 36.0–46.0)
Hemoglobin: 11.1 g/dL — ABNORMAL LOW (ref 12.0–15.0)
Immature Granulocytes: 1 %
Lymphocytes Relative: 8 %
Lymphs Abs: 0.3 10*3/uL — ABNORMAL LOW (ref 0.7–4.0)
MCH: 31.8 pg (ref 26.0–34.0)
MCHC: 35.7 g/dL (ref 30.0–36.0)
MCV: 89.1 fL (ref 80.0–100.0)
Monocytes Absolute: 0.4 10*3/uL (ref 0.1–1.0)
Monocytes Relative: 9 %
Neutro Abs: 3.3 10*3/uL (ref 1.7–7.7)
Neutrophils Relative %: 82 %
Platelet Count: 121 10*3/uL — ABNORMAL LOW (ref 150–400)
RBC: 3.49 MIL/uL — ABNORMAL LOW (ref 3.87–5.11)
RDW: 22.5 % — ABNORMAL HIGH (ref 11.5–15.5)
WBC Count: 4 10*3/uL (ref 4.0–10.5)
nRBC: 0 % (ref 0.0–0.2)

## 2021-01-27 LAB — CMP (CANCER CENTER ONLY)
ALT: 112 U/L — ABNORMAL HIGH (ref 0–44)
AST: 187 U/L (ref 15–41)
Albumin: 3 g/dL — ABNORMAL LOW (ref 3.5–5.0)
Alkaline Phosphatase: 864 U/L — ABNORMAL HIGH (ref 38–126)
Anion gap: 12 (ref 5–15)
BUN: 12 mg/dL (ref 8–23)
CO2: 20 mmol/L — ABNORMAL LOW (ref 22–32)
Calcium: 8.4 mg/dL — ABNORMAL LOW (ref 8.9–10.3)
Chloride: 101 mmol/L (ref 98–111)
Creatinine: 1.01 mg/dL — ABNORMAL HIGH (ref 0.44–1.00)
GFR, Estimated: >60 mL/min (ref >60)
Glucose, Bld: 106 mg/dL — ABNORMAL HIGH (ref 70–99)
Potassium: 4.7 mmol/L (ref 3.5–5.1)
Sodium: 133 mmol/L — ABNORMAL LOW (ref 135–145)
Total Bilirubin: 0.7 mg/dL (ref 0.3–1.2)
Total Protein: 6.9 g/dL (ref 6.5–8.1)

## 2021-01-27 MED ORDER — SODIUM CHLORIDE 0.9 % IV SOLN
Freq: Once | INTRAVENOUS | Status: AC
  Filled 2021-01-27: qty 250

## 2021-01-27 MED ORDER — SODIUM CHLORIDE 0.9% FLUSH
10.0000 mL | INTRAVENOUS | Status: DC | PRN
  Administered 2021-01-27: 10 mL
  Filled 2021-01-27: qty 10

## 2021-01-27 MED ORDER — ONDANSETRON HCL 8 MG PO TABS: 8.0000 mg | ORAL_TABLET | Freq: Three times a day (TID) | ORAL | 0 refills | Status: DC | PRN

## 2021-01-27 MED ORDER — PALONOSETRON HCL INJECTION 0.25 MG/5ML
0.2500 mg | Freq: Once | INTRAVENOUS | Status: AC
  Administered 2021-01-27: 0.25 mg via INTRAVENOUS

## 2021-01-27 MED ORDER — DIPHENHYDRAMINE HCL 50 MG/ML IJ SOLN
INTRAMUSCULAR | Status: AC
  Filled 2021-01-27: qty 1

## 2021-01-27 MED ORDER — FAMOTIDINE IN NACL 20-0.9 MG/50ML-% IV SOLN
INTRAVENOUS | Status: AC
  Filled 2021-01-27: qty 50

## 2021-01-27 MED ORDER — PALONOSETRON HCL INJECTION 0.25 MG/5ML
INTRAVENOUS | Status: AC
  Filled 2021-01-27: qty 5

## 2021-01-27 MED ORDER — PROCHLORPERAZINE MALEATE 10 MG PO TABS: 10.0000 mg | ORAL_TABLET | Freq: Four times a day (QID) | ORAL | 2 refills | Status: AC | PRN

## 2021-01-27 MED ORDER — SODIUM CHLORIDE 0.9 % IV SOLN
10.0000 mg/kg | Freq: Once | INTRAVENOUS | Status: AC
  Administered 2021-01-27: 540 mg via INTRAVENOUS
  Filled 2021-01-27: qty 54

## 2021-01-27 MED ORDER — HEPARIN SOD (PORK) LOCK FLUSH 100 UNIT/ML IV SOLN
500.0000 [IU] | Freq: Once | INTRAVENOUS | Status: AC | PRN
  Administered 2021-01-27: 500 [IU]
  Filled 2021-01-27: qty 5

## 2021-01-27 MED ORDER — DIPHENHYDRAMINE HCL 50 MG/ML IJ SOLN
50.0000 mg | Freq: Once | INTRAMUSCULAR | Status: AC
  Administered 2021-01-27: 50 mg via INTRAVENOUS

## 2021-01-27 MED ORDER — ATROPINE SULFATE 1 MG/ML IJ SOLN: 0.5000 mg | Freq: Once | INTRAMUSCULAR | Status: DC | PRN

## 2021-01-27 MED ORDER — SODIUM CHLORIDE 0.9 % IV SOLN
10.0000 mg | Freq: Once | INTRAVENOUS | Status: AC
  Administered 2021-01-27: 10 mg via INTRAVENOUS
  Filled 2021-01-27: qty 10

## 2021-01-27 MED ORDER — SODIUM CHLORIDE 0.9% FLUSH
10.0000 mL | Freq: Once | INTRAVENOUS | Status: AC
  Administered 2021-01-27: 10 mL
  Filled 2021-01-27: qty 10

## 2021-01-27 MED ORDER — SODIUM CHLORIDE 0.9 % IV SOLN
150.0000 mg | Freq: Once | INTRAVENOUS | Status: AC
  Administered 2021-01-27: 150 mg via INTRAVENOUS
  Filled 2021-01-27: qty 150

## 2021-01-27 MED ORDER — FAMOTIDINE IN NACL 20-0.9 MG/50ML-% IV SOLN
20.0000 mg | Freq: Once | INTRAVENOUS | Status: AC
Start: 2021-01-27 — End: 2021-01-27
  Administered 2021-01-27: 20 mg via INTRAVENOUS

## 2021-01-27 MED ORDER — ACETAMINOPHEN 325 MG PO TABS: 650.0000 mg | ORAL_TABLET | Freq: Once | ORAL | Status: DC

## 2021-01-27 NOTE — Patient Instructions (Signed)
[facility] Discharge Instructions for Patients Receiving Chemotherapy  Today you received the following chemotherapy agents Ivette Loyal  To help prevent nausea and vomiting after your treatment, we encourage you to take your nausea medication as directed   If you develop nausea and vomiting that is not controlled by your nausea medication, call the clinic.   BELOW ARE SYMPTOMS THAT SHOULD BE REPORTED IMMEDIATELY:  *FEVER GREATER THAN 100.5 F  *CHILLS WITH OR WITHOUT FEVER  NAUSEA AND VOMITING THAT IS NOT CONTROLLED WITH YOUR NAUSEA MEDICATION  *UNUSUAL SHORTNESS OF BREATH  *UNUSUAL BRUISING OR BLEEDING  TENDERNESS IN MOUTH AND THROAT WITH OR WITHOUT PRESENCE OF ULCERS  *URINARY PROBLEMS  *BOWEL PROBLEMS  UNUSUAL RASH Items with * indicate a potential emergency and should be followed up as soon as possible.  Feel free to call the clinic should you have any questions or concerns. The clinic phone number is [phone].  Please show the [place] at check-in to the Emergency Department and triage nurse.  Sacituzumab Govitecan Injection What is this medicine? SACITUZUMAB GOVITECAN (SAK i TOOZ ue mab GOE vi TEE kan) is a monoclonal antibody combined with chemotherapy. It is used to treat breast cancer and urothelial cancer. This medicine may be used for other purposes; ask your health care provider or pharmacist if you have questions. COMMON BRAND NAME(S): TRODELVY What should I tell my health care provider before I take this medicine? They need to know if you have any of these conditions:  diarrhea  infection (especially a virus infection such as chickenpox, cold sores, or herpes)  liver disease  low blood counts, like low white cell, platelet, or red cell counts  an unusual or allergic reaction to sacituzumab govitecan, other medicines, foods, dyes, or preservatives  pregnant or trying to get pregnant  breast-feeding How should I use this  medicine? This medicine is for infusion into a vein. It is given by a healthcare professional in a hospital or clinic setting. Talk to your pediatrician about the use of this medicine in children. Special care may be needed. Overdosage: If you think you have taken too much of this medicine contact a poison control center or emergency room at once. NOTE: This medicine is only for you. Do not share this medicine with others. What if I miss a dose? Keep appointments for follow-up doses. It is important not to miss your dose. Call your doctor or health care professional if you are unable to keep an appointment. What may interact with this medicine?  certain antivirals for HIV or hepatitis  gemfibrozil This list may not describe all possible interactions. Give your health care provider a list of all the medicines, herbs, non-prescription drugs, or dietary supplements you use. Also tell them if you smoke, drink alcohol, or use illegal drugs. Some items may interact with your medicine. What should I watch for while using this medicine? Your condition will be monitored carefully while you are receiving this medicine. You may need blood work done while you are taking this medicine. This medicine can cause serious allergic reactions. To reduce your risk, you may need to take medicine before treatment with this medicine. Take your medicine as directed. Do not become pregnant while taking this medicine or for 6 months after stopping it. Women should inform their health care professional if they wish to become pregnant or think they might be pregnant. Men should not father a child while taking this medicine and for 3 months after stopping  it. There is potential for serious side effects to an unborn child. Talk to your health care professional for more information. Do not breast-feed a child while taking this medicine or for 1 month after stopping it. This medicine may increase your risk of getting an  infection. Call your health care professional for advice if you get a fever, chills, or sore throat, or other symptoms of a cold or flu. Do not treat yourself. Try to avoid being around people who are sick. Avoid taking medicines that contain aspirin, acetaminophen, ibuprofen, naproxen, or ketoprofen unless instructed by your health care professional. These medicines may hide a fever. This medicine has caused ovarian failure in some women. This medicine may make it more difficult to get pregnant. Talk to your health care professional if you are concerned about your fertility. What side effects may I notice from receiving this medicine? Side effects that you should report to your doctor or health care professional as soon as possible:  allergic reactions like skin rash, itching or hives; swelling of the face, lips, or tongue  diarrhea  nausea/vomiting  signs and symptoms of infection like fever; chills; cough; sore throat; pain or trouble passing urine  signs and symptoms of low red blood cells or anemia such as unusually weak or tired; feeling faint or lightheaded; falls; breathing problems Side effects that usually do not require medical attention (report these to your doctor or health care professional if they continue or are bothersome):  constipation  hair loss  headache  loss of appetite  signs and symptoms of high blood sugar such as being more thirsty or hungry or having to urinate more than normal. You may also feel very tired or have blurry vision.  trouble sleeping This list may not describe all possible side effects. Call your doctor for medical advice about side effects. You may report side effects to FDA at 1-800-FDA-1088. Where should I keep my medicine? This medicine is given in a hospital or clinic and will not be stored at home. NOTE: This sheet is a summary. It may not cover all possible information. If you have questions about this medicine, talk to your doctor,  pharmacist, or health care provider.  2021 Elsevier/Gold Standard (2020-02-16 11:59:29)

## 2021-01-27 NOTE — Progress Notes (Signed)
Per Dr. Grant Ruts OK to treat today wil ALT of 187 and AST of 112  Per Dr. Burr Medico, [facility] to discharge patient with current BP.  Patient is stable and has no complaints.  Instructed to take BP at home.  Patient verbalized understanding.

## 2021-01-27 NOTE — Progress Notes (Signed)
Alerted by Laurence Compton, RN, of critical value -- AST 187 -- at 0855. Notified Dr Burr Medico of value at [phone].

## 2021-01-27 NOTE — Progress Notes (Signed)
  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 with residual disease s/p neoadjuvant chemotherapy versus remote recurrence  and synchronous Stage IIIC, pT4N2aM0 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 She developed anticipated skin hyperpigmentation but her skin was intact at the completion of her treatment.  Plan:The patient will receive a call in about one month from the radiation oncology department. She will continue follow up with Dr. Burr Medico as well.   ________________________________________________    Carola Rhine, [facility]

## 2021-01-28 ENCOUNTER — Telehealth: Payer: Self-pay | Admitting: Hematology

## 2021-01-28 ENCOUNTER — Other Ambulatory Visit (HOSPITAL_COMMUNITY): Payer: Self-pay

## 2021-01-28 ENCOUNTER — Telehealth: Payer: Self-pay | Admitting: *Deleted

## 2021-01-28 NOTE — Telephone Encounter (Signed)
Checked out appointment. No LOS notes needing to be scheduled. No changes made. 

## 2021-01-28 NOTE — Telephone Encounter (Signed)
-----   Message from Priscille Loveless, RN sent at 01/27/2021  1:23 PM EDT ----- Regarding: First Chemo f/u dr. Burr Medico First [facility] follow-up

## 2021-01-28 NOTE — Telephone Encounter (Signed)
Called pt to see how she did with her treatment yesterday.  She reports doing well & denies any problems.  She reports knowing how to reach [place] if she does.

## 2021-01-29 ENCOUNTER — Other Ambulatory Visit: Payer: Self-pay

## 2021-01-29 ENCOUNTER — Ambulatory Visit: Payer: BC Managed Care – PPO

## 2021-01-29 DIAGNOSIS — C50911 Malignant neoplasm of unspecified site of right female breast: Secondary | ICD-10-CM

## 2021-01-29 DIAGNOSIS — C50412 Malignant neoplasm of upper-outer quadrant of left female breast: Secondary | ICD-10-CM

## 2021-01-29 DIAGNOSIS — C50912 Malignant neoplasm of unspecified site of left female breast: Secondary | ICD-10-CM

## 2021-01-29 DIAGNOSIS — R600 Localized edema: Secondary | ICD-10-CM

## 2021-01-29 DIAGNOSIS — R293 Abnormal posture: Secondary | ICD-10-CM

## 2021-01-29 NOTE — Patient Instructions (Signed)
Access Code: [facility] URL: [email or web address] Date: 01/29/2021 Prepared by: Cheral Almas  Exercises Scapular Retraction with Resistance - 1 x daily - 3 x weekly - 1 sets - 10 reps Scapular Retraction with Resistance Advanced - 1 x daily - 1 x weekly - 1 sets - 10 reps

## 2021-01-29 NOTE — Therapy (Signed)
[place], [place], [address] Phone: [phone]   Fax:  [phone]  Physical Therapy Treatment  Patient Details  Name: Victoria Coffey MRN: [id number] Date of Birth: 04/05/1956 Referring Provider (PT): Dr Burr Medico   Encounter Date: 01/29/2021   PT End of Session - 01/29/21 0818    Visit Number 9    Number of Visits 13    Date for PT Re-Evaluation 01/29/21    PT Start Time 0802    PT Stop Time 3536    PT Time Calculation (min) 53 min    Activity Tolerance Patient tolerated treatment well    Behavior During Therapy [facility] for tasks assessed/performed           Past Medical History:  Diagnosis Date  . Aortic atherosclerosis ([place])   . Breast cancer ([place]) dx'd 1993   left  . Fatty liver   . History of left breast cancer   . HIV infection ([place])   . Hypertension   . Port-A-Cath in place     Past Surgical History:  Procedure Laterality Date  . AXILLARY LYMPH NODE DISSECTION Right 10/14/2020   Procedure: left axilla removal of two drains right axillary lymph node dissection;  Surgeon: [name], MD;  Location: [facility];  Service: General;  Laterality: Right;  PEC BLOCK, RNFA OR PA  . BREAST BIOPSY Bilateral 03/22/2020   Procedure: BILATERAL BREAST PUNCH BIOPSIES;  Surgeon: [name], MD;  Location: [facility];  Service: General;  Laterality: Bilateral;  . BREAST LUMPECTOMY    . CESAREAN SECTION    . left breast lumpectomy  1993  . MASTECTOMY W/ SENTINEL NODE BIOPSY Bilateral 10/01/2020  . MASTECTOMY W/ SENTINEL NODE BIOPSY Bilateral 10/01/2020   Procedure: BILATERAL MASTECTOMIES WITH RIGHT SENTINEL LYMPH NODE BIOPSIES,RIGHT TARGETED RADIOACTIVE SEED LYMPH NODE, LEFT BREAST MODIFIED RADICAL MASTECTOMY ;  Surgeon: [name], MD;  Location: [facility];  Service: General;  Laterality: Bilateral;  PEC BLOCK, RNFA  . PORTACATH PLACEMENT Right 03/22/2020   Procedure: INSERTION PORT-A-CATH WITH ULTRASOUND  GUIDANCE;  Surgeon: [name], MD;  Location: [facility];  Service: General;  Laterality: Right;  . RADIOACTIVE SEED GUIDED AXILLARY SENTINEL LYMPH NODE Right 10/01/2020   Procedure: RADIOACTIVE SEED GUIDED  TARGETED RIGHT SENTINEL LYMPH NODE BIOPSY;  Surgeon: [name], MD;  Location: [facility];  Service: General;  Laterality: Right;  PEC BLOCK, RNFA    There were no vitals filed for this visit.   Subjective Assessment - 01/29/21 0803    Subjective When I went for my biopsy the nurse snatched an electrode and it peeled some of my skin.  I have it covered with a bandaid. I have been feeling pretty good about my shoulders and chest.  Dr. [name] was very pleased with my progress.  I have completed my radiation and am on a new treatment regimen because the cancer has spread to my liver. Have been doing some MLD but not everyday because I have been very tired. I recieved my second sleeve last week but I haven't tried it on yet.    Pertinent History Patient was diagnosed on 02/28/2020 with left invasive ductal carcinoma breast cancer. The mass measured 3.9 cm and was located in the upper outer quadrant. It was ER positive, PR negative, and HER2 negative with a Ki67 of 40%. Her left breast appeared to be retracted with significant skin changes. She also had 3 abnormal appearing nodes in the RIGHT axilla and 1 was biopsied  and found to be positive for carcinoma. She has a history of left breast cancer in 1993 when she underwent a left lumpectomy, ALND, chemotherapy and radiation. For most recent diagnosis She had neoadjuvant chemotherapy 03/22/20-08/23/20.Pt is now s/p Left breast modified radical mastectomy and right mastectomy with 3+ LN of 6 removed on  10/01/20.  On 10/14/20 she underwent a right ALND. She is presently undergoing radiation.    Patient Stated Goals reduce lymphedema risk, and learn shoulder ROM and strengthening exs and safe way to progress, Decrease swelling    Currently in Pain? No/denies     Pain Score 0-No pain              [facility] PT Assessment - 01/29/21 0001      Assessment   Medical Diagnosis bilateral Breast CA    Referring Provider (PT) Dr Burr Medico    Onset Date/Surgical Date 02/28/20    Hand Dominance Right      AROM   Right Shoulder Extension 60 Degrees    Right Shoulder Flexion 153 Degrees    Right Shoulder ABduction 152 Degrees    Right Shoulder Internal Rotation 75 Degrees    Right Shoulder External Rotation 100 Degrees    Left Shoulder Extension 58 Degrees    Left Shoulder Flexion 149 Degrees    Left Shoulder ABduction 150 Degrees    Left Shoulder Internal Rotation 75 Degrees    Left Shoulder External Rotation 96 Degrees             LYMPHEDEMA/ONCOLOGY QUESTIONNAIRE - 01/29/21 0001      Type   Cancer Type bilateral Breast CA      Surgeries   Mastectomy Date 10/01/20    Other Surgery Date 10/14/20   Right ALND   Number Lymph Nodes Removed 13   right     Treatment   Active Chemotherapy Treatment Yes    Past Chemotherapy Treatment Yes    Active Radiation Treatment No    Past Radiation Treatment Yes    Current Hormone Treatment No    Past Hormone Therapy No      Right Upper Extremity Lymphedema   At Axilla  29.8 cm    15 cm Proximal to Olecranon Process 28.8 cm    10 cm Proximal to Olecranon Process 28 cm    Olecranon Process 23.5 cm    15 cm Proximal to Ulnar Styloid Process 22.1 cm    10 cm Proximal to Ulnar Styloid Process 17.7 cm    Just Proximal to Ulnar Styloid Process 15 cm    At Base of 2nd Digit 6.2 cm      Left Upper Extremity Lymphedema   At Axilla  29 cm    15 cm Proximal to Olecranon Process 28.3 cm    10 cm Proximal to Olecranon Process 27.4 cm    Olecranon Process 23.2 cm    15 cm Proximal to Ulnar Styloid Process 21.2 cm    10 cm Proximal to Ulnar Styloid Process 17.5 cm    Just Proximal to Ulnar Styloid Process 14.7 cm    At Base of 2nd Digit 6.2 cm                      [facility] Adult PT  Treatment/Exercise - 01/29/21 0001      Shoulder Exercises: Standing   Extension Strengthening;Both;10 reps    Theraband Level (Shoulder Extension) Level 1 (Yellow)    Retraction Strengthening;Both;10 reps    Theraband Level (  Shoulder Retraction) Level 1 (Yellow)      Shoulder Exercises: Pulleys   Flexion 2 minutes    ABduction 2 minutes                       PT Long Term Goals - 01/29/21 0815      PT LONG TERM GOAL #1   Title Patient will demonstrate she has regained full shoulder ROM and function post operatively compared to baselines.    Time 6    Period Weeks    Status Achieved    Target Date 01/29/21      PT LONG TERM GOAL #2   Title pt will be independent in self MLD  to right UE and trunk    Time 4    Period Weeks    Status Achieved      PT LONG TERM GOAL #3   Title Pt will have minimal difference in circumference of both arms    Time 6    Period Weeks    Status Achieved      PT LONG TERM GOAL #4   Title Pt will understand precautions for skin care and decreasing risk of infection/lymphedema    Time 6    Period Weeks    Status Achieved      PT LONG TERM GOAL #5   Title Pt will be independent in HEP for shoulder ROM and strengthening    Time 6    Period Weeks    Status Achieved                 Plan - 01/29/21 0901    Clinical Impression Statement Pt has achieved all goals established at initial evaluation.  She is compliant with HEP and MLD.  She wears her compression sleeve on a daily basis.The circumference measures of both arms are very similar. Bilateral shoulder ROM is WNL.  She does have several areas of scarring from skin breakdown from radiation and a new area on right upper chest where an electrode was pulled off.  We reviewed skin care and precautions for lymphedema and she verbalized understanding.She is pleased with her progress and feels ready to be released to independent self management. She was a joy to work with.     Personal Factors and Comorbidities Comorbidity 1;Comorbidity 3+    Comorbidities Breast CA reoccurence, Aids, aortic atherosclerosis    Stability/Clinical Decision Making Stable/Uncomplicated    Rehab Potential Good    PT Frequency 2x / week    PT Duration 6 weeks    PT Treatment/Interventions ADLs/Self Care Home Management;Therapeutic activities;Therapeutic exercise;Neuromuscular re-education;Manual techniques;Orthotic Fit/Training;Patient/family education;Manual lymph drainage;Compression bandaging;Scar mobilization;Passive range of motion    PT Next Visit Plan Pt feels ready to be released from PT and has met all goals    PT Home Exercise Plan Post op shoulder ROM HEP, supine flex/scaption with wand, self MLD, Scap retraction and shoulder extension in standing with yellow TB    Recommended Other Services received second sleeve    Consulted and Agree with Plan of Care Patient           Patient will benefit from skilled therapeutic intervention in order to improve the following deficits and impairments:  Decreased knowledge of precautions,Decreased range of motion,Decreased skin integrity,Decreased scar mobility,Decreased strength,Increased edema,Increased fascial restricitons,Postural dysfunction  Visit Diagnosis: Localized edema  Abnormal posture  Inflammatory breast cancer, right (HCC)  Malignant neoplasm of upper-outer quadrant of left breast in female, estrogen receptor positive ([place])  Inflammatory breast cancer, left [facility])     Problem List Patient Active Problem List   Diagnosis Date Noted  . Primary malignant neoplasm of upper outer quadrant of right breast ([place]) 10/01/2020  . Drug-induced neutropenia ([place]) 06/28/2020  . Thrombocytopenia ([place]) 06/07/2020  . Port-A-Cath in place 04/19/2020  . Malignant neoplasm of upper-outer quadrant of left breast in female, estrogen receptor negative ([place]) 03/05/2020  . Rash and nonspecific skin eruption 11/08/2013  . History of  breast cancer 12/20/2012  . HYPERTENSION, BENIGN ESSENTIAL 08/11/2007  . HIV DISEASE 04/14/2007   PHYSICAL THERAPY DISCHARGE SUMMARY  Visits from Start of Care: 9  Current functional level related to goals / functional outcomes: Pt achieved all goals   Remaining deficits: none   Education / Equipment: Pt. Purchased compression garments  Plan: Patient agrees to discharge.  Patient goals were met. Patient is being discharged due to meeting the stated rehab goals.  ?????      Claris Pong 01/29/2021, 10:37 AM  [place], [place], [address] Phone: [phone]   Fax:  [phone]  Name: KEYLAH DARWISH MRN: [id number] Date of Birth: 1956-01-28 Cheral Almas, PT 01/29/21 10:38 AM

## 2021-01-31 ENCOUNTER — Telehealth: Payer: Self-pay | Admitting: Genetic Counselor

## 2021-01-31 ENCOUNTER — Encounter: Payer: Self-pay | Admitting: Hematology

## 2021-01-31 MED FILL — Fosaprepitant Dimeglumine For IV Infusion 150 MG (Base Eq): INTRAVENOUS | Qty: 5 | Status: AC

## 2021-01-31 MED FILL — Dexamethasone Sodium Phosphate Inj 100 MG/10ML: INTRAMUSCULAR | Qty: 1 | Status: AC

## 2021-01-31 NOTE — Telephone Encounter (Signed)
Called to discuss genetic test results. Victoria Coffey is not available to talk right now, but will be coming into the cancer center on Monday for treatment. She will plan to stop by our offices to receive her results on Monday.

## 2021-01-31 NOTE — Progress Notes (Signed)
Victoria Coffey   [phone] [phone]   Clinic Follow up Note   Patient Care Team: [name], MD as PCP - General (Infectious Diseases) [name], MD as PCP - Infectious Diseases (Infectious Diseases) [name], MD as Consulting Physician (General Surgery) [name], MD as Consulting Physician (Hematology) [name], MD as Consulting Physician (Radiation Oncology) [name], RN as Oncology Nurse Navigator [name], RN as Oncology Nurse Navigator  Date of Service:  02/03/2021  CHIEF COMPLAINT: F/u ofbilateralbreast cancer  SUMMARY OF ONCOLOGIC HISTORY: Oncology History Overview Note  Cancer Staging Bilateral breast cancer [facility]) Staging form: Breast, AJCC 8th Edition - Pathologic stage from 10/01/2020: Stage IIIC (pT3, pN2a, cM0, G3, ER-, PR-, HER2-) - Signed by Alla Feeling, NP on 10/24/2020  Malignant neoplasm of upper-outer quadrant of left breast in female, estrogen receptor positive ([place]) Staging form: Breast, AJCC 8th Edition - Clinical stage from 03/01/2020: Stage IIIC (cT4, cN0, cM0, G3, ER+, PR-, HER2-) - Signed by [name], MD on 08/22/2020 - Pathologic stage from 10/01/2020: Stage IIIC (pT4b, pN0, cM0, G3, ER-, PR-, HER2-) - Signed by Alla Feeling, NP on 10/24/2020    Malignant neoplasm of upper-outer quadrant of left breast in female, estrogen receptor negative ([place])  02/28/2020 Mammogram   Diagnostic Mammogram 02/28/20  IMPRESSION The 3.9 cm ill defined mass in the left breast posterior depth central 4.3cm to the nipple seen on the mediolateral oblique view onlt with associated difssue skin thickening is highly suspicious for malignancy.    03/01/2020 Cancer Staging   Staging form: Breast, AJCC 8th Edition - Clinical stage from 03/01/2020: Stage IIIC (cT4, cN0, cM0, G3, ER+, PR-, HER2-) - Signed by [name], MD on 08/22/2020   03/01/2020 Initial Biopsy   Diagnosis 03/01/20  1. Breast, left,  needle core biopsy, 9 o'clock, 3-4cmfn - INVASIVE DUCTAL CARCINOMA. SEE NOTE 2. Lymph node, needle/core biopsy, right axilla - INVASIVE DUCTAL CARCINOMA. SEE NOTE Diagnosis Note 1. Invasive carcinoma measures 1.5 cm in greatest linear dimension and appears grade 3. Dr. [name] reviewed the case and concurs with the diagnosis. A breast prognostic profile (ER, PR, Ki-67 and HER2) is pending and will be reported in an addendum. Dr. [name] was notified on 03/04/2020. 2. Carcinoma measures 0.6 cm in greatest linear dimension and appears grade 3. Lymphoid tissue is not identified - this may represent a completely replaced lymph node. A breast prognostic profile (ER, PR, Ki-67 and HER2) is pending and will be reported in an addendum. Dr. [name] reviewed the case and concurs with the diagnosis. Dr. [name] was notified on 03/04/2020.   03/01/2020 Receptors her2   1. PROGNOSTIC INDICATORS Results: IMMUNOHISTOCHEMICAL AND MORPHOMETRIC ANALYSIS PERFORMED MANUALLY The tumor cells are NEGATIVE for Her2 (0). Estrogen Receptor: 20%, POSITIVE, WEAK STAINING INTENSITY Progesterone Receptor: 0%, NEGATIVE Proliferation Marker Ki67: 40% COMMENT: The negative hormone receptor study(ies) in this case has an internal positive control.    2. PROGNOSTIC INDICATORS Results: IMMUNOHISTOCHEMICAL AND MORPHOMETRIC ANALYSIS PERFORMED MANUALLY The tumor cells are NEGATIVE for Her2 (1+). Estrogen Receptor: 10%, POSITIVE, WEAK STAINING INTENSITY Progesterone Receptor: 0%, NEGATIVE Proliferation Marker Ki67: 40% COMMENT: The negative hormone receptor study(ies) in this case has no internal positive control.   03/05/2020 Initial Diagnosis   Malignant neoplasm of upper-outer quadrant of left breast in female, estrogen receptor positive ([place])   03/18/2020 Breast MRI   IMPRESSION: 1. 7 x 9 x 6 cm area of suspicious non masslike enhancement within the central/UPPER RIGHT breast  with anterior skin thickening.  Given biopsy-proven metastatic RIGHT axillary lymph node, tissue sampling of the anterior and posterior aspects of this non masslike RIGHT breast enhancement is recommended to exclude malignancy. 2. 7 x 6 x 5 cm diffuse masslike and nonmasslike enhancement within the majority of the remaining LEFT breast with diffuse LEFT breast skin thickening, compatible with malignancy. Abnormal enhancement is noted along the anterior aspect of the LEFT pectoralis muscle. 3. Three abnormal RIGHT axillary lymph nodes, 1 which is biopsy proven to be metastatic disease. No abnormal LEFT axillary lymph nodes identified.   03/20/2020 Echocardiogram   Baseline ECHO  IMPRESSIONS     1. The patient was reported to be in pain during the study and not able  to participate in this study. All that can be said is that the LV and RV  function are grossly normal. Would recommend to repeat this study when/if  the patient is able to tolerate  the exam.   2. Left ventricular ejection fraction, by estimation, is 60 to 65%. The  left ventricle has normal function. Left ventricular endocardial border  not optimally defined to evaluate regional wall motion. Left ventricular  diastolic function could not be  evaluated.   3. Right ventricular systolic function is normal. The right ventricular  size is normal.   4. The mitral valve was not well visualized. No evidence of mitral valve  regurgitation.   5. The aortic valve was not well visualized. Aortic valve regurgitation  is not visualized.   6. The inferior vena cava is normal in size with greater than 50%  respiratory variability, suggesting right atrial pressure of 3 mmHg.    03/20/2020 Imaging   CT CAP W contrast  IMPRESSION: 1. Small right axillary/subpectoral lymph nodes. Difficult to exclude metastatic disease. 2. Subtle 6 mm low-attenuation lesion in the dome of the liver, not well seen previously. Lesion is too small to characterize. Further evaluation  could be performed with MR abdomen without and with contrast, as clinically indicated. 3. Hepatic steatosis. 4. Aortic atherosclerosis (ICD10-I70.0). Coronary artery calcification.     03/20/2020 Imaging   Whole body bone scan  IMPRESSION: 1. Increased activity noted over the right breast, possibly related to the patient's right breast cancer.   2. Punctate area of increased activity over the left maxillary region, most likely related to sinus disease or dental disease. Increased activity noted over the left mandible also possibly related to dental disease. No other focal bony abnormalities to suggest metastatic disease identified.   03/22/2020 Procedure   PAC placement by Dr [name]    03/22/2020 - 08/23/2020 Chemotherapy   AC q2weeks for 4 cycles 03/22/20-05/03/20 followed by weekly Taxol and Carboplatin q3weeks for 12 weeks starting 05/17/20.                  Week 2 Taxol reduced to half dose and CT was dose reduced starting with week 4 due to thrombocytopenia. Given pancytopenia, will change Carboplatin to 50% dose weekly with Taxol 22m/m2 starting with week 7 (06/13/20). Will hold Carboplatin as needed based on labs. Carboplatin increased to 80% starting with week 8. Carboplatin dose reduced for C11 and held with C12. C12 Taxol postponed to 08/23/20   03/22/2020 Pathology Results   FINAL MICROSCOPIC DIAGNOSIS:   A. BREAST, RIGHT, PUNCH BIOPSY:  - Carcinoma involving dermal lymphatics.  - See comment.   B. BREAST, LEFT, PUNCH BIOPSY:  - Carcinoma involving dermal lymphatics.  - See comment.   COMMENT:  The  morphology is compatible with ductal breast carcinoma.    ADDENDUM:   PROGNOSTIC INDICATOR RESULTS:   Immunohistochemical and morphometric analysis performed manually   The tumor cells are NEGATIVE for Her2 (0%).   Estrogen Receptor:       NEGATIVE, 0%  Progesterone Receptor:   NEGATIVE, 0%    08/13/2020 Breast MRI   IMPRESSION: 1. Interval significant response to  treatment of the diffuse non-mass enhancement involving the UPPER RIGHT breast. There is a solitary residual 5 mm indeterminate mass in the UPPER OUTER QUADRANT at MIDDLE depth with plateau kinetics. 2. Minimal response to treatment of the diffuse non-mass enhancement throughout the LEFT breast. 3. Resolution of enhancement of the skin and nipple-areolar complex of the RIGHT breast. 4. Near complete resolution of enhancement of the skin and nipple-areolar complex of the LEFT breast. Minimal residual nipple-areola complex enhancement persists. 5. The 3 previously identified pathologic RIGHT axillary lymph nodes are now normal in appearance. There is no pathologic lymphadenopathy currently.   08/23/2020 - 01/13/2021 Chemotherapy   Immunotherapy Keytruda q3weeks starting 08/23/20 for 1 year treatment. D/c on 01/13/21 given disease recurrence/progression   10/01/2020 Surgery   MASTECTOMIES WITH RIGHT SENTINEL LYMPH NODE BIOPSIES,RIGHT TARGETED RADIOACTIVE SEED LYMPH NODE, LEFT BREAST MODIFIED RADICAL MASTECTOMY  and RADIOACTIVE SEED GUIDED  TARGETED RIGHT SENTINEL LYMPH NODE BIOPSY by Dr [name]     10/01/2020 Pathology Results   FINAL MICROSCOPIC DIAGNOSIS:   A. BREAST, LEFT, MASTECTOMY:  - Invasive ductal carcinoma, 7 cm.  - Anterior and posterior soft tissue margins not involved by tumor.  - Foci of skin involvement by tumor, see comment.  - Nipple involvement by tumor.  - See oncology table and comment.   B. LYMPH NODE, LEFT AXILLARY, CONTENTS:  - One lymph node with no metastatic carcinoma (0/1).   C. BREAST, RIGHT, MASTECTOMY:  - Invasive ductal carcinoma, greater than 5 cm.  - Margins not involved.  - Subareolar breast tissue involved by tumor.  - See oncology table and comment.   D. LYMPH NODE, RIGHT #2, SENTINEL, EXCISION:  - Metastatic carcinoma in one lymph node, 0.5 cm (1/1).   E. LYMPH NODE, RIGHT #1, SENTINEL, EXCISION:  - Metastatic carcinoma in one lymph node,  1.5 cm (1/1).  - Biopsy site and biopsy clip.   F. LYMPH NODE, RIGHT #3, SENTINEL, EXCISION:  - Metastatic carcinoma in one lymph node, 1.5 cm (1/1).   G. LYMPH NODE, RIGHT #4, SENTINEL, EXCISION:  - One lymph node with no metastatic carcinoma (0/1).   H. LYMPH NODE, RIGHT #5, SENTINEL, EXCISION:  - Metastatic carcinoma in one lymph node, 0.5 cm (1/1).   I. LYMPH NODE, RIGHT #6, SENTINEL, EXCISION:  - One lymph node with no metastatic carcinoma (0/1).     A.  LEFT BREAST PROGNOSTIC INDICATOR RESULTS:   Immunohistochemical and morphometric analysis performed manually   The tumor cells are NEGATIVE for Her2 (0).   Estrogen Receptor:       0%, Negative  Progesterone Receptor:   0%, Negative  Proliferation Marker Ki-67:   80%   Comment: The negative hormone receptor study(ies) in the case has an  internal positive control.   Reference Range Estrogen and Progesterone Receptor       Negative  0%       Positive  >1%   All controls stained appropriately.   C. RIGHT BREAST PROGNOSTIC INDICATOR RESULTS:   Immunohistochemical and morphometric analysis performed manually   The tumor cells are NEGATIVE for Her2 (0).  Estrogen Receptor:       0%, Negative  Progesterone Receptor:   0%, Negative  Proliferation Marker Ki-67:   90%   10/01/2020 Cancer Staging   Staging form: Breast, AJCC 8th Edition - Pathologic stage from 10/01/2020: Stage IIIC (pT4b, pN0, cM0, G3, ER-, PR-, HER2-) - Signed by Alla Feeling, NP on 10/24/2020   10/14/2020 Surgery   RIGHT AXILLARY LYMPH NODE DISSECTION by Dr [name]  -1 /13 positive LN for metastatic carcinoma    11/18/2020 - 01/20/2021 Chemotherapy   Adjuvant Xeloda 1500 mg BID days 1-14 q21 days x6 months, starting 11/18/2020 (will reduce to 1500 mg Am/1000 mg Pm M-F during radiation). Stopped 01/20/21 due to liver mets/disease recurrence   12/10/2020 - 01/24/2021 Radiation Therapy   Adjuvant Radiation to right chest wall and axilla with Dr Lisbeth Renshaw  on 12/10/20-01/24/21   12/16/2020 Tumor Marker   CA 27.29 at 582.2 on 11/14/20   Ca 27.29 at 1387.9 on 12/16/20   01/13/2021 Progression   PET IMPRESSION: 1. Interval development of bulky hypermetabolic liver metastases involving both hepatic lobes. 2. Hypermetabolic lesion in the posterior left hemithorax. This is obscured by small pleural effusion on CT imaging and could be a pulmonary nodule or pleural nodule. Given superimposition of the pleural fluid, pleural nodule is favored. 3. Tiny subtle soft tissue nodule anterior paramidline left hemithorax with low level FDG uptake raises concern for a second site of pleural disease in the left chest. 4.  Aortic Atherosclerois (ICD10-170.0)   01/15/2021 Pathology Results   FINAL MICROSCOPIC DIAGNOSIS:   A. LIVER, RIGHT LOBE, NEEDLE CORE BIOPSY:  - Metastatic carcinoma to liver, consistent with patient's clinical  history of primary breast carcinoma.  See comment    PROGNOSTIC INDICATOR RESULTS:   Immunohistochemical and morphometric analysis performed manually   The tumor cells are NEGATIVE for Her2 (1+).   Estrogen Receptor:       POSITIVE, 20%, WEAK STAINING  Progesterone Receptor:   NEGATIVE  Proliferation Marker Ki-67:   30%    01/27/2021 -  Chemotherapy   First-line IV Trodelvy 2 weeks on/1 week off starting 01/27/21    Genetic Testing   Negative genetic testing. No pathogenic variants identified on the [facility] CustomNext+RNA panel. The report date is 01/29/2021.   The CustomNext-Cancer + RNAinsight panel  includes sequencing and/or deletion duplication testing of the following 47 genes: APC, ATM, AXIN2, BARD1, BMPR1A, BRCA1, BRCA2, BRIP1, CDH1, CDK4, CDKN2A, CHEK2, DICER1, MEN1, MLH1, MSH2, MSH3, MSH6, MUTYH, NBN, NF1, NF2, NTHL1, PALB2, PMS2, PTEN, RAD51C, RAD51D, RECQL, RET, SDHA, SDHAF2, SDHB, SDHC, SDHD, SMAD4, SMARCA4, STK11, TP53, TSC1, TSC2 and VHL (sequencing and deletion/duplication); HOXB13, POLD1 and POLE (sequencing  only); EPCAM and GREM1 (deletion/duplication only). RNA data is routinely analyzed for use in variant interpretation for all genes.      CURRENT THERAPY:  First-line IV Trodelvy 2 weeks on/1 week off starting 01/27/21. Added GCSF after C1D1.   INTERVAL HISTORY:  Victoria Coffey is here for a follow up. She was last seen by me 01/27/21. She presents to the clinic alone. She notes after chemo last week she felt mostly well with fatigue. She was able to be active at home. She denies fever, chills, nausea, or overt bleeding. She notes she tries to stay hydrated with water.     REVIEW OF SYSTEMS:   Constitutional: Denies fevers, chills or abnormal weight loss (+) Fatigue Eyes: Denies blurriness of vision Ears, nose, mouth, throat, and face: Denies mucositis or sore throat Respiratory:  Denies cough, dyspnea or wheezes Cardiovascular: Denies palpitation, chest discomfort or lower extremity swelling Gastrointestinal:  Denies nausea, heartburn or change in bowel habits Skin: Denies abnormal skin rashes Lymphatics: Denies new lymphadenopathy or easy bruising Neurological:Denies numbness, tingling or new weaknesses Behavioral/Psych: Mood is stable, no new changes  All other systems were reviewed with the patient and are negative.  MEDICAL HISTORY:  Past Medical History:  Diagnosis Date  . Aortic atherosclerosis ([place])   . Breast cancer ([place]) dx'd 1993   left  . Fatty liver   . History of left breast cancer   . HIV infection ([place])   . Hypertension   . Port-A-Cath in place     SURGICAL HISTORY: Past Surgical History:  Procedure Laterality Date  . AXILLARY LYMPH NODE DISSECTION Right 10/14/2020   Procedure: left axilla removal of two drains right axillary lymph node dissection;  Surgeon: [name], MD;  Location: [facility];  Service: General;  Laterality: Right;  PEC BLOCK, RNFA OR PA  . BREAST BIOPSY Bilateral 03/22/2020   Procedure: BILATERAL BREAST PUNCH BIOPSIES;  Surgeon: [name], MD;  Location: [facility];  Service: General;  Laterality: Bilateral;  . BREAST LUMPECTOMY    . CESAREAN SECTION    . left breast lumpectomy  1993  . MASTECTOMY W/ SENTINEL NODE BIOPSY Bilateral 10/01/2020  . MASTECTOMY W/ SENTINEL NODE BIOPSY Bilateral 10/01/2020   Procedure: BILATERAL MASTECTOMIES WITH RIGHT SENTINEL LYMPH NODE BIOPSIES,RIGHT TARGETED RADIOACTIVE SEED LYMPH NODE, LEFT BREAST MODIFIED RADICAL MASTECTOMY ;  Surgeon: [name], MD;  Location: [facility];  Service: General;  Laterality: Bilateral;  PEC BLOCK, RNFA  . PORTACATH PLACEMENT Right 03/22/2020   Procedure: INSERTION PORT-A-CATH WITH ULTRASOUND GUIDANCE;  Surgeon: [name], MD;  Location: [facility];  Service: General;  Laterality: Right;  . RADIOACTIVE SEED GUIDED AXILLARY SENTINEL LYMPH NODE Right 10/01/2020   Procedure: RADIOACTIVE SEED GUIDED  TARGETED RIGHT SENTINEL LYMPH NODE BIOPSY;  Surgeon: [name], MD;  Location: [facility];  Service: General;  Laterality: Right;  PEC BLOCK, RNFA    I have reviewed the social history and family history with the patient and they are unchanged from previous note.  ALLERGIES:  is allergic to codeine, other, grapefruit bioflavonoid complex, pomegranate [punica], and shellfish allergy.  MEDICATIONS:  Current Outpatient Medications  Medication Sig Dispense Refill  . acetaminophen (TYLENOL) 500 MG tablet Take 1,000 mg by mouth every 6 (six) hours as needed for moderate pain or headache.     . emtricitabine-rilpivir-tenofovir AF (ODEFSEY) 200-25-25 MG TABS tablet Take 1 tablet by mouth daily with breakfast. 30 tablet 11  . hydrochlorothiazide (HYDRODIURIL) 25 MG tablet TAKE 1 TABLET(25 MG) BY MOUTH DAILY (Patient taking differently: Take 25 mg by mouth daily.) 30 tablet 5  . lidocaine-prilocaine (EMLA) cream Apply 1 application topically See admin instructions. Apply 1-2 hours prior to access    . loratadine (CLARITIN) 10 MG tablet Take 10 mg by mouth See admin instructions. Takes  3-4 days before and after treatment    . ondansetron (ZOFRAN) 8 MG tablet Take 1 tablet (8 mg total) by mouth every 8 (eight) hours as needed for nausea or vomiting. 20 tablet 0  . potassium chloride SA (KLOR-CON) 20 MEQ tablet Take 1 tablet (20 mEq total) by mouth daily. 30 tablet 0  . prochlorperazine (COMPAZINE) 10 MG tablet Take 1 tablet (10 mg total) by mouth every 6 (six) hours as needed (Nausea or vomiting). 30 tablet 2  . traMADol (ULTRAM) 50 MG tablet Take  1 tablet (50 mg total) by mouth every 6 (six) hours as needed (mild pain). (Patient not taking: Reported on 01/14/2021) 30 tablet 1   No current facility-administered medications for this visit.    PHYSICAL EXAMINATION: ECOG PERFORMANCE STATUS: 1 - Symptomatic but completely ambulatory  Vitals:   02/03/21 1007  BP: 115/87  Pulse: (!) 105  Resp: 18  Temp: 97.7 F (36.5 C)  SpO2: 100%   Filed Weights   02/03/21 1007  Weight: 121 lb 6.4 oz (55.1 kg)    GENERAL:alert, no distress and comfortable SKIN: skin color, texture, turgor are normal, no rashes or significant lesions EYES: normal, Conjunctiva are pink and non-injected, sclera clear   NECK: supple, thyroid normal size, non-tender, without nodularity LYMPH:  no palpable lymphadenopathy in the cervical, axillary  LUNGS: clear to auscultation and percussion with normal breathing effort HEART: regular rate & rhythm and no murmurs and no lower extremity edema ABDOMEN:abdomen soft, non-tender and normal bowel sounds Musculoskeletal:no cyanosis of digits and no clubbing  NEURO: alert & oriented x 3 with fluent speech, no focal motor/sensory deficits  LABORATORY DATA:  I have reviewed the data as listed CBC Latest Ref Rng & Units 02/03/2021 01/27/2021 01/20/2021  WBC 4.0 - 10.5 K/uL 1.0(L) 4.0 3.8(L)  Hemoglobin 12.0 - 15.0 g/dL 10.0(L) 11.1(L) 11.6(L)  Hematocrit 36.0 - 46.0 % 28.5(L) 31.1(L) 33.6(L)  Platelets 150 - 400 K/uL 74(L) 121(L) 150     CMP Latest Ref Rng &  Units 02/03/2021 01/27/2021 01/20/2021  Glucose 70 - 99 mg/dL 114(H) 106(H) 108(H)  BUN 8 - 23 mg/dL _0 Creatinine 0.44 - 1.00 mg/dL 0.99 1.01(H) 1.02(H)  Sodium 135 - 145 mmol/L 134(L) 133(L) 133(L)  Potassium 3.5 - 5.1 mmol/L 5.8(H) 4.7 3.7  Chloride 98 - 111 mmol/L 106 101 101  CO2 22 - 32 mmol/L 17(L) 20(L) 22  Calcium 8.9 - 10.3 mg/dL 8.3(L) 8.4(L) 8.9  Total Protein 6.5 - 8.1 g/dL 6.5 6.9 7.4  Total Bilirubin 0.3 - 1.2 mg/dL 0.8 0.7 0.7  Alkaline Phos 38 - 126 U/L 871(H) 864(H) 776(H)  AST 15 - 41 U/L 127(H) 187(HH) 140(H)  ALT 0 - 44 U/L 105(H) 112(H) 87(H)      RADIOGRAPHIC STUDIES: I have personally reviewed the radiological images as listed and agreed with the findings in the report. No results found.   ASSESSMENT & PLAN:  KESHIA WEARE is a 65 y.o. female with    1.InflammatoryLeft breast cancer,pT4N0Mx,ypT4N0,triple negativeAND Right breast cancer withaxillary node metastasis,pT3N2aM0,ERweakly+,PR/HER2-,ypT3N2a, triple negative,GradeIII, diffuse liver metastasis(ER weakly+/PR-/HER-)in 12/2020 -She was diagnosed with left breast cancer in 02/2020.She presented with inflammatory left breast cancer. Biopsy of left breast mass and right axillary LN were positive for Invasive ductal carcinoma.Further work up showed 9cm nodular area in right breast and skin biopsy confirmed cancer involvement. Staging scansin May 2021were negative for distant metastasis. -She completed AC X4 and weekly TC 03/22/20-08/23/20. She underwent b/l mastectomy with Dr [name] on 10/01/20.Shealsounderwent right axillary lymph node dissection on 10/14/2020 and completed Adjuvant radiation with Dr Moody2/8/22-3/25/22. -Given recentpublish [employer] 522 trial data,I started her on Immunotherapy Keytruda q3weeks for 1 year of treatment beginning 08/23/20.Due tohersignificantresidual diseasein LNs,Istarted her onadjuvant Xelodafor 6 months on 11/18/20. Unfortunately she  progressed/recurred with diffuse liver mets on 01/13/21 PET and 01/15/21 Liver biopsy (Her/PR negative/ER 20% weakly positive).Keytruda/Xeloda d/c.  -I have requested [facility] one on her biopsy sample.  -I started her on first-line IV Trodelvy 2 weeks on/1 week off beginning 01/27/21.  -  She tolerated her first week of treatment moderately well with Fatigue. She has been able to improve over the week and remain active.  -Labs reviewed, per lab she has 74K plt, 1.0 WBC and 0.5 ANC. I discussed this is borderline to proceed with Day 8 chemo today. I discussed the option of proceeding with Day 8 today at reduced dose and giving GCSF  Fulphila on day 10 to improve her blood counts. She is agreeable.  -due to the need of PA for G-CSF, I will cancel her chemo treatment today, and start cycle 2-day 1 next Monday -Her K did increase to 5.8 on supplements. I recommend she stop potassium supplement now.  -f/u in 2 weeks.   2.Cytopenias -secondary to chemo -Mild and stableanemia and intermittent leukopenia.Continue monitoring labs weekly -After C1D1 Trodelvy her blood counts dropped to 74K plt, 1.0 WBC and 0.5 ANC (02/03/21). I discussed this is likely exacerbated by long term chemo exposure. Will monitor closely. Will repeat lab in 1 week.   3. H/o Left breast cancer, Dx in 1993s/p lumpectomy, radiation, chemo, Tamoxifen.Genetic Testing -Given her multiple breast cancers, I recommend genetic testing. She is agreeable.  4. Comorbidities: HIV(+)Dx 1993, HTN -Her HIV has been undetectable and well controlled. She is currently being seen by ID Dr Graylon Good -Ipreviouslyreferredher for Evusheld injection for COVID prevention  5.Mild transaminitis, secondary toliver metastasis -We will monitor -AST has significantly increased to 187, ALT 112, Alk Phos 864. Tbili remain normal (01/27/21)  PLAN: -Labs reviewed, wil lcancel chemo today due to her significant neutropenia moderate thrombocytopenia -We  will get preauthorization for Udenyca, to be given on date for next cycle -Lab, flush, and Trodelvy in 1 and 2  -f/u in 2 weeks     No problem-specific Assessment & Plan notes found for this encounter.   No orders of the defined types were placed in this encounter.  All questions were answered. The patient knows to call the clinic with any problems, questions or concerns. No barriers to learning was detected. The total time spent in the appointment was 30 minutes.     [name], MD 02/03/2021   I, Joslyn Devon, am acting as scribe for [name], MD.   I have reviewed the above documentation for accuracy and completeness, and I agree with the above.

## 2021-02-03 ENCOUNTER — Other Ambulatory Visit: Payer: Self-pay

## 2021-02-03 ENCOUNTER — Encounter: Payer: Self-pay | Admitting: Licensed Clinical Social Worker

## 2021-02-03 ENCOUNTER — Inpatient Hospital Stay: Payer: BC Managed Care – PPO

## 2021-02-03 ENCOUNTER — Inpatient Hospital Stay: Payer: BC Managed Care – PPO | Attending: Hematology

## 2021-02-03 ENCOUNTER — Inpatient Hospital Stay (HOSPITAL_BASED_OUTPATIENT_CLINIC_OR_DEPARTMENT_OTHER): Payer: BC Managed Care – PPO | Admitting: Hematology

## 2021-02-03 ENCOUNTER — Ambulatory Visit: Payer: Self-pay | Admitting: Licensed Clinical Social Worker

## 2021-02-03 ENCOUNTER — Encounter: Payer: Self-pay | Admitting: Radiology

## 2021-02-03 ENCOUNTER — Telehealth: Payer: Self-pay | Admitting: Hematology

## 2021-02-03 ENCOUNTER — Encounter: Payer: Self-pay | Admitting: Hematology

## 2021-02-03 VITALS — BP 115/87 | HR 105 | Temp 97.7°F | Resp 18 | Ht 60.0 in | Wt 121.4 lb

## 2021-02-03 DIAGNOSIS — R7401 Elevation of levels of liver transaminase levels: Secondary | ICD-10-CM | POA: Insufficient documentation

## 2021-02-03 DIAGNOSIS — Z21 Asymptomatic human immunodeficiency virus [HIV] infection status: Secondary | ICD-10-CM | POA: Diagnosis not present

## 2021-02-03 DIAGNOSIS — D6481 Anemia due to antineoplastic chemotherapy: Secondary | ICD-10-CM | POA: Insufficient documentation

## 2021-02-03 DIAGNOSIS — I10 Essential (primary) hypertension: Secondary | ICD-10-CM | POA: Diagnosis not present

## 2021-02-03 DIAGNOSIS — Z171 Estrogen receptor negative status [ER-]: Secondary | ICD-10-CM | POA: Insufficient documentation

## 2021-02-03 DIAGNOSIS — C50412 Malignant neoplasm of upper-outer quadrant of left female breast: Secondary | ICD-10-CM

## 2021-02-03 DIAGNOSIS — Z170 Estrogen receptor positive status [ER+]: Secondary | ICD-10-CM

## 2021-02-03 DIAGNOSIS — Z5112 Encounter for antineoplastic immunotherapy: Secondary | ICD-10-CM | POA: Diagnosis present

## 2021-02-03 DIAGNOSIS — C787 Secondary malignant neoplasm of liver and intrahepatic bile duct: Secondary | ICD-10-CM | POA: Insufficient documentation

## 2021-02-03 DIAGNOSIS — C50411 Malignant neoplasm of upper-outer quadrant of right female breast: Secondary | ICD-10-CM | POA: Diagnosis not present

## 2021-02-03 DIAGNOSIS — E876 Hypokalemia: Secondary | ICD-10-CM | POA: Diagnosis not present

## 2021-02-03 DIAGNOSIS — Z5189 Encounter for other specified aftercare: Secondary | ICD-10-CM | POA: Insufficient documentation

## 2021-02-03 DIAGNOSIS — Z95828 Presence of other vascular implants and grafts: Secondary | ICD-10-CM

## 2021-02-03 DIAGNOSIS — Z9013 Acquired absence of bilateral breasts and nipples: Secondary | ICD-10-CM | POA: Insufficient documentation

## 2021-02-03 DIAGNOSIS — Z1379 Encounter for other screening for genetic and chromosomal anomalies: Secondary | ICD-10-CM

## 2021-02-03 DIAGNOSIS — Z923 Personal history of irradiation: Secondary | ICD-10-CM | POA: Insufficient documentation

## 2021-02-03 LAB — CMP (CANCER CENTER ONLY)
ALT: 105 U/L — ABNORMAL HIGH (ref 0–44)
AST: 127 U/L — ABNORMAL HIGH (ref 15–41)
Albumin: 2.7 g/dL — ABNORMAL LOW (ref 3.5–5.0)
Alkaline Phosphatase: 871 U/L — ABNORMAL HIGH (ref 38–126)
Anion gap: 11 (ref 5–15)
BUN: 14 mg/dL (ref 8–23)
CO2: 17 mmol/L — ABNORMAL LOW (ref 22–32)
Calcium: 8.3 mg/dL — ABNORMAL LOW (ref 8.9–10.3)
Chloride: 106 mmol/L (ref 98–111)
Creatinine: 0.99 mg/dL (ref 0.44–1.00)
GFR, Estimated: >60 mL/min (ref >60)
Glucose, Bld: 114 mg/dL — ABNORMAL HIGH (ref 70–99)
Potassium: 5.8 mmol/L — ABNORMAL HIGH (ref 3.5–5.1)
Sodium: 134 mmol/L — ABNORMAL LOW (ref 135–145)
Total Bilirubin: 0.8 mg/dL (ref 0.3–1.2)
Total Protein: 6.5 g/dL (ref 6.5–8.1)

## 2021-02-03 LAB — CBC WITH DIFFERENTIAL (CANCER CENTER ONLY)
Abs Immature Granulocytes: 0 10*3/uL (ref 0.00–0.07)
Basophils Absolute: 0 10*3/uL (ref 0.0–0.1)
Basophils Relative: 1 %
Eosinophils Absolute: 0 10*3/uL (ref 0.0–0.5)
Eosinophils Relative: 0 %
HCT: 28.5 % — ABNORMAL LOW (ref 36.0–46.0)
Hemoglobin: 10 g/dL — ABNORMAL LOW (ref 12.0–15.0)
Immature Granulocytes: 0 %
Lymphocytes Relative: 35 %
Lymphs Abs: 0.3 10*3/uL — ABNORMAL LOW (ref 0.7–4.0)
MCH: 32.5 pg (ref 26.0–34.0)
MCHC: 35.1 g/dL (ref 30.0–36.0)
MCV: 92.5 fL (ref 80.0–100.0)
Monocytes Absolute: 0.2 10*3/uL (ref 0.1–1.0)
Monocytes Relative: 17 %
Neutro Abs: 0.5 10*3/uL — ABNORMAL LOW (ref 1.7–7.7)
Neutrophils Relative %: 47 %
Platelet Count: 74 10*3/uL — ABNORMAL LOW (ref 150–400)
RBC: 3.08 MIL/uL — ABNORMAL LOW (ref 3.87–5.11)
RDW: 24 % — ABNORMAL HIGH (ref 11.5–15.5)
WBC Count: 1 10*3/uL — ABNORMAL LOW (ref 4.0–10.5)
nRBC: 0 % (ref 0.0–0.2)

## 2021-02-03 MED ORDER — SODIUM CHLORIDE 0.9% FLUSH
10.0000 mL | Freq: Once | INTRAVENOUS | Status: AC
  Administered 2021-02-03: 10 mL
  Filled 2021-02-03: qty 10

## 2021-02-03 MED ORDER — SODIUM CHLORIDE 0.9% FLUSH
10.0000 mL | Freq: Once | INTRAVENOUS | Status: AC
Start: 2021-02-03 — End: 2021-02-03
  Administered 2021-02-03: 10 mL
  Filled 2021-02-03: qty 10

## 2021-02-03 MED ORDER — ACETAMINOPHEN 325 MG PO TABS
ORAL_TABLET | ORAL | Status: AC
  Filled 2021-02-03: qty 2

## 2021-02-03 MED ORDER — DIPHENHYDRAMINE HCL 50 MG/ML IJ SOLN
INTRAMUSCULAR | Status: AC
  Filled 2021-02-03: qty 1

## 2021-02-03 MED ORDER — HEPARIN SOD (PORK) LOCK FLUSH 100 UNIT/ML IV SOLN
500.0000 [IU] | Freq: Once | INTRAVENOUS | Status: AC
  Administered 2021-02-03: 500 [IU]
  Filled 2021-02-03: qty 5

## 2021-02-03 MED ORDER — PALONOSETRON HCL INJECTION 0.25 MG/5ML
INTRAVENOUS | Status: AC
  Filled 2021-02-03: qty 5

## 2021-02-03 NOTE — Research (Signed)
[id number]: A RANDOMIZED TRIAL ADDRESSING CANCER-RELATED FINANCIAL HARDSHIP THROUGH  DELIVERY OF A PROACTIVE FINANCIAL NAVIGATION INTERVENTION (CREDIT)   02/03/21    10:15AM  INTRO: Patient Victoria Coffey was identified by this clinical research coordinator as a potential candidate for the above listed study.  This Clinical Research Coordinator met with [name], [id number], on 02/03/21 in a manner and location that ensures patient privacy to discuss participation in the above listed research study.  Patient is Unaccompanied.  A copy of the informed consent document and separate HIPAA Authorization was provided to the patient.  Patient reads, speaks, and understands [place].   Patient was provided with the business card of this Coordinator and encouraged to contact the research team with any questions.  Approximately 15 minutes were spent with the patient reviewing the informed consent documents.  Patient was provided the option of taking informed consent documents home to review and was encouraged to review at their convenience with their support network, including other care providers. Patient took the consent documents home to review.Thanked patient for her time and opportunity. This clinical research coordinator will call patient at a later date to discuss any further questions and/or interest.   Carol Ada, RT(R)(T) Clinical Research Coordinator

## 2021-02-03 NOTE — Progress Notes (Signed)
HPI:  Victoria Coffey was previously seen in [facility] due to a personal history of breast cancer and concerns regarding a hereditary predisposition to cancer. Please refer to our prior cancer genetics clinic note for more information regarding our discussion, assessment and recommendations, at the time. Victoria Coffey recent genetic test results were disclosed to her, as were recommendations warranted by these results. These results and recommendations are discussed in more detail below.  CANCER HISTORY:  Oncology History Overview Note  Cancer Staging Bilateral breast cancer [facility]) Staging form: Breast, AJCC 8th Edition - Pathologic stage from 10/01/2020: Stage IIIC (pT3, pN2a, cM0, G3, ER-, PR-, HER2-) - Signed by Alla Feeling, NP on 10/24/2020  Malignant neoplasm of upper-outer quadrant of left breast in female, estrogen receptor positive ([place]) Staging form: Breast, AJCC 8th Edition - Clinical stage from 03/01/2020: Stage IIIC (cT4, cN0, cM0, G3, ER+, PR-, HER2-) - Signed by [name], MD on 08/22/2020 - Pathologic stage from 10/01/2020: Stage IIIC (pT4b, pN0, cM0, G3, ER-, PR-, HER2-) - Signed by Alla Feeling, NP on 10/24/2020    Malignant neoplasm of upper-outer quadrant of left breast in female, estrogen receptor negative ([place])  02/28/2020 Mammogram   Diagnostic Mammogram 02/28/20  IMPRESSION The 3.9 cm ill defined mass in the left breast posterior depth central 4.3cm to the nipple seen on the mediolateral oblique view onlt with associated difssue skin thickening is highly suspicious for malignancy.    03/01/2020 Cancer Staging   Staging form: Breast, AJCC 8th Edition - Clinical stage from 03/01/2020: Stage IIIC (cT4, cN0, cM0, G3, ER+, PR-, HER2-) - Signed by [name], MD on 08/22/2020   03/01/2020 Initial Biopsy   Diagnosis 03/01/20  1. Breast, left, needle core biopsy, 9 o'clock, 3-4cmfn - INVASIVE DUCTAL CARCINOMA. SEE NOTE 2. Lymph node, needle/core biopsy,  right axilla - INVASIVE DUCTAL CARCINOMA. SEE NOTE Diagnosis Note 1. Invasive carcinoma measures 1.5 cm in greatest linear dimension and appears grade 3. Dr. [name] reviewed the case and concurs with the diagnosis. A breast prognostic profile (ER, PR, Ki-67 and HER2) is pending and will be reported in an addendum. Dr. [name] was notified on 03/04/2020. 2. Carcinoma measures 0.6 cm in greatest linear dimension and appears grade 3. Lymphoid tissue is not identified - this may represent a completely replaced lymph node. A breast prognostic profile (ER, PR, Ki-67 and HER2) is pending and will be reported in an addendum. Dr. [name] reviewed the case and concurs with the diagnosis. Dr. [name] was notified on 03/04/2020.   03/01/2020 Receptors her2   1. PROGNOSTIC INDICATORS Results: IMMUNOHISTOCHEMICAL AND MORPHOMETRIC ANALYSIS PERFORMED MANUALLY The tumor cells are NEGATIVE for Her2 (0). Estrogen Receptor: 20%, POSITIVE, WEAK STAINING INTENSITY Progesterone Receptor: 0%, NEGATIVE Proliferation Marker Ki67: 40% COMMENT: The negative hormone receptor study(ies) in this case has an internal positive control.    2. PROGNOSTIC INDICATORS Results: IMMUNOHISTOCHEMICAL AND MORPHOMETRIC ANALYSIS PERFORMED MANUALLY The tumor cells are NEGATIVE for Her2 (1+). Estrogen Receptor: 10%, POSITIVE, WEAK STAINING INTENSITY Progesterone Receptor: 0%, NEGATIVE Proliferation Marker Ki67: 40% COMMENT: The negative hormone receptor study(ies) in this case has no internal positive control.   03/05/2020 Initial Diagnosis   Malignant neoplasm of upper-outer quadrant of left breast in female, estrogen receptor positive ([place])   03/18/2020 Breast MRI   IMPRESSION: 1. 7 x 9 x 6 cm area of suspicious non masslike enhancement within the central/UPPER RIGHT breast with anterior skin thickening. Given biopsy-proven metastatic RIGHT axillary lymph node, tissue sampling of  the anterior and posterior aspects of this non  masslike RIGHT breast enhancement is recommended to exclude malignancy. 2. 7 x 6 x 5 cm diffuse masslike and nonmasslike enhancement within the majority of the remaining LEFT breast with diffuse LEFT breast skin thickening, compatible with malignancy. Abnormal enhancement is noted along the anterior aspect of the LEFT pectoralis muscle. 3. Three abnormal RIGHT axillary lymph nodes, 1 which is biopsy proven to be metastatic disease. No abnormal LEFT axillary lymph nodes identified.   03/20/2020 Echocardiogram   Baseline ECHO  IMPRESSIONS     1. The patient was reported to be in pain during the study and not able  to participate in this study. All that can be said is that the LV and RV  function are grossly normal. Would recommend to repeat this study when/if  the patient is able to tolerate  the exam.   2. Left ventricular ejection fraction, by estimation, is 60 to 65%. The  left ventricle has normal function. Left ventricular endocardial border  not optimally defined to evaluate regional wall motion. Left ventricular  diastolic function could not be  evaluated.   3. Right ventricular systolic function is normal. The right ventricular  size is normal.   4. The mitral valve was not well visualized. No evidence of mitral valve  regurgitation.   5. The aortic valve was not well visualized. Aortic valve regurgitation  is not visualized.   6. The inferior vena cava is normal in size with greater than 50%  respiratory variability, suggesting right atrial pressure of 3 mmHg.    03/20/2020 Imaging   CT CAP W contrast  IMPRESSION: 1. Small right axillary/subpectoral lymph nodes. Difficult to exclude metastatic disease. 2. Subtle 6 mm low-attenuation lesion in the dome of the liver, not well seen previously. Lesion is too small to characterize. Further evaluation could be performed with MR abdomen without and with contrast, as clinically indicated. 3. Hepatic steatosis. 4. Aortic  atherosclerosis (ICD10-I70.0). Coronary artery calcification.     03/20/2020 Imaging   Whole body bone scan  IMPRESSION: 1. Increased activity noted over the right breast, possibly related to the patient's right breast cancer.   2. Punctate area of increased activity over the left maxillary region, most likely related to sinus disease or dental disease. Increased activity noted over the left mandible also possibly related to dental disease. No other focal bony abnormalities to suggest metastatic disease identified.   03/22/2020 Procedure   PAC placement by Dr [name]    03/22/2020 - 08/23/2020 Chemotherapy   AC q2weeks for 4 cycles 03/22/20-05/03/20 followed by weekly Taxol and Carboplatin q3weeks for 12 weeks starting 05/17/20.                  Week 2 Taxol reduced to half dose and CT was dose reduced starting with week 4 due to thrombocytopenia. Given pancytopenia, will change Carboplatin to 50% dose weekly with Taxol 80m/m2 starting with week 7 (06/13/20). Will hold Carboplatin as needed based on labs. Carboplatin increased to 80% starting with week 8. Carboplatin dose reduced for C11 and held with C12. C12 Taxol postponed to 08/23/20   03/22/2020 Pathology Results   FINAL MICROSCOPIC DIAGNOSIS:   A. BREAST, RIGHT, PUNCH BIOPSY:  - Carcinoma involving dermal lymphatics.  - See comment.   B. BREAST, LEFT, PUNCH BIOPSY:  - Carcinoma involving dermal lymphatics.  - See comment.   COMMENT:  The morphology is compatible with ductal breast carcinoma.    ADDENDUM:   PROGNOSTIC  INDICATOR RESULTS:   Immunohistochemical and morphometric analysis performed manually   The tumor cells are NEGATIVE for Her2 (0%).   Estrogen Receptor:       NEGATIVE, 0%  Progesterone Receptor:   NEGATIVE, 0%    08/13/2020 Breast MRI   IMPRESSION: 1. Interval significant response to treatment of the diffuse non-mass enhancement involving the UPPER RIGHT breast. There is a solitary residual 5 mm  indeterminate mass in the UPPER OUTER QUADRANT at MIDDLE depth with plateau kinetics. 2. Minimal response to treatment of the diffuse non-mass enhancement throughout the LEFT breast. 3. Resolution of enhancement of the skin and nipple-areolar complex of the RIGHT breast. 4. Near complete resolution of enhancement of the skin and nipple-areolar complex of the LEFT breast. Minimal residual nipple-areola complex enhancement persists. 5. The 3 previously identified pathologic RIGHT axillary lymph nodes are now normal in appearance. There is no pathologic lymphadenopathy currently.   08/23/2020 - 01/13/2021 Chemotherapy   Immunotherapy Keytruda q3weeks starting 08/23/20 for 1 year treatment. D/c on 01/13/21 given disease recurrence/progression   10/01/2020 Surgery   MASTECTOMIES WITH RIGHT SENTINEL LYMPH NODE BIOPSIES,RIGHT TARGETED RADIOACTIVE SEED LYMPH NODE, LEFT BREAST MODIFIED RADICAL MASTECTOMY  and RADIOACTIVE SEED GUIDED  TARGETED RIGHT SENTINEL LYMPH NODE BIOPSY by Dr [name]     10/01/2020 Pathology Results   FINAL MICROSCOPIC DIAGNOSIS:   A. BREAST, LEFT, MASTECTOMY:  - Invasive ductal carcinoma, 7 cm.  - Anterior and posterior soft tissue margins not involved by tumor.  - Foci of skin involvement by tumor, see comment.  - Nipple involvement by tumor.  - See oncology table and comment.   B. LYMPH NODE, LEFT AXILLARY, CONTENTS:  - One lymph node with no metastatic carcinoma (0/1).   C. BREAST, RIGHT, MASTECTOMY:  - Invasive ductal carcinoma, greater than 5 cm.  - Margins not involved.  - Subareolar breast tissue involved by tumor.  - See oncology table and comment.   D. LYMPH NODE, RIGHT #2, SENTINEL, EXCISION:  - Metastatic carcinoma in one lymph node, 0.5 cm (1/1).   E. LYMPH NODE, RIGHT #1, SENTINEL, EXCISION:  - Metastatic carcinoma in one lymph node, 1.5 cm (1/1).  - Biopsy site and biopsy clip.   F. LYMPH NODE, RIGHT #3, SENTINEL, EXCISION:  - Metastatic  carcinoma in one lymph node, 1.5 cm (1/1).   G. LYMPH NODE, RIGHT #4, SENTINEL, EXCISION:  - One lymph node with no metastatic carcinoma (0/1).   H. LYMPH NODE, RIGHT #5, SENTINEL, EXCISION:  - Metastatic carcinoma in one lymph node, 0.5 cm (1/1).   I. LYMPH NODE, RIGHT #6, SENTINEL, EXCISION:  - One lymph node with no metastatic carcinoma (0/1).     A.  LEFT BREAST PROGNOSTIC INDICATOR RESULTS:   Immunohistochemical and morphometric analysis performed manually   The tumor cells are NEGATIVE for Her2 (0).   Estrogen Receptor:       0%, Negative  Progesterone Receptor:   0%, Negative  Proliferation Marker Ki-67:   80%   Comment: The negative hormone receptor study(ies) in the case has an  internal positive control.   Reference Range Estrogen and Progesterone Receptor       Negative  0%       Positive  >1%   All controls stained appropriately.   C. RIGHT BREAST PROGNOSTIC INDICATOR RESULTS:   Immunohistochemical and morphometric analysis performed manually   The tumor cells are NEGATIVE for Her2 (0).   Estrogen Receptor:       0%, Negative  Progesterone  Receptor:   0%, Negative  Proliferation Marker Ki-67:   90%   10/01/2020 Cancer Staging   Staging form: Breast, AJCC 8th Edition - Pathologic stage from 10/01/2020: Stage IIIC (pT4b, pN0, cM0, G3, ER-, PR-, HER2-) - Signed by Alla Feeling, NP on 10/24/2020   10/14/2020 Surgery   RIGHT AXILLARY LYMPH NODE DISSECTION by Dr [name]  -1 /13 positive LN for metastatic carcinoma    11/18/2020 - 01/20/2021 Chemotherapy   Adjuvant Xeloda 1500 mg BID days 1-14 q21 days x6 months, starting 11/18/2020 (will reduce to 1500 mg Am/1000 mg Pm M-F during radiation). Stopped 01/20/21 due to liver mets/disease recurrence   12/10/2020 - 01/24/2021 Radiation Therapy   Adjuvant Radiation to right chest wall and axilla with Dr Lisbeth Renshaw on 12/10/20-01/24/21   12/16/2020 Tumor Marker   CA 27.29 at 582.2 on 11/14/20   Ca 27.29 at 1387.9 on  12/16/20   01/13/2021 Progression   PET IMPRESSION: 1. Interval development of bulky hypermetabolic liver metastases involving both hepatic lobes. 2. Hypermetabolic lesion in the posterior left hemithorax. This is obscured by small pleural effusion on CT imaging and could be a pulmonary nodule or pleural nodule. Given superimposition of the pleural fluid, pleural nodule is favored. 3. Tiny subtle soft tissue nodule anterior paramidline left hemithorax with low level FDG uptake raises concern for a second site of pleural disease in the left chest. 4.  Aortic Atherosclerois (ICD10-170.0)   01/15/2021 Pathology Results   FINAL MICROSCOPIC DIAGNOSIS:   A. LIVER, RIGHT LOBE, NEEDLE CORE BIOPSY:  - Metastatic carcinoma to liver, consistent with patient's clinical  history of primary breast carcinoma.  See comment    PROGNOSTIC INDICATOR RESULTS:   Immunohistochemical and morphometric analysis performed manually   The tumor cells are NEGATIVE for Her2 (1+).   Estrogen Receptor:       POSITIVE, 20%, WEAK STAINING  Progesterone Receptor:   NEGATIVE  Proliferation Marker Ki-67:   30%    01/27/2021 -  Chemotherapy   First-line IV Trodelvy 2 weeks on/1 week off starting 01/27/21    Genetic Testing   Negative genetic testing. No pathogenic variants identified on the [facility] CustomNext+RNA panel. The report date is 01/29/2021.   The CustomNext-Cancer + RNAinsight panel  includes sequencing and/or deletion duplication testing of the following 47 genes: APC, ATM, AXIN2, BARD1, BMPR1A, BRCA1, BRCA2, BRIP1, CDH1, CDK4, CDKN2A, CHEK2, DICER1, MEN1, MLH1, MSH2, MSH3, MSH6, MUTYH, NBN, NF1, NF2, NTHL1, PALB2, PMS2, PTEN, RAD51C, RAD51D, RECQL, RET, SDHA, SDHAF2, SDHB, SDHC, SDHD, SMAD4, SMARCA4, STK11, TP53, TSC1, TSC2 and VHL (sequencing and deletion/duplication); HOXB13, POLD1 and POLE (sequencing only); EPCAM and GREM1 (deletion/duplication only). RNA data is routinely analyzed for use in variant  interpretation for all genes.     FAMILY HISTORY:  We obtained a detailed, 4-generation family history.  Significant diagnoses are listed below: Family History  Problem Relation Age of Onset  . Hypertension Mother   . Congestive Heart Failure Mother   . Diabetes Mother   . Congestive Heart Failure Father   . Diabetes Sister   . Diabetes Brother   . Diabetes Brother   . Heart failure Paternal Aunt   . Heart failure Paternal Uncle    Victoria Coffey has one daughter (age 35) and one son (age 20). She has three brothers (ages 21-63) and two sisters (ages 79-66). None of these relatives have had cancer.  Victoria Coffey mother is alive at age 40 without cancer. There were three maternal aunts and one maternal uncle. There  is no known cancer among maternal aunts/uncles or maternal cousins. Victoria Coffey maternal grandmother died in her 67s or 73s without cancer. Her maternal grandfather died at age 64 without cancer.  Victoria Coffey father died at age 81 without cancer. There were four paternal aunts and four paternal uncles, most of whom died from heart failure. There is no known cancer among paternal aunts/uncles or paternal cousins. Victoria Coffey paternal grandmother died in her 75s without cancer. Her paternal grandfather died in his late 93s without cancer.  Victoria Coffey is unaware of previous family history of genetic testing for hereditary cancer risks. Patient's ancestors are of Black/African American descent. There is no reported Ashkenazi Jewish ancestry. There is no known consanguinity.  GENETIC TEST RESULTS: Genetic testing reported out on 01/29/2021 through the [facility] CustomNext+RNA cancer panel found no pathogenic mutations.   The CustomNext-Cancer + RNAinsight panel  includes sequencing and/or deletion duplication testing of the following 47 genes: APC, ATM, AXIN2, BARD1, BMPR1A, BRCA1, BRCA2, BRIP1, CDH1, CDK4, CDKN2A, CHEK2, DICER1, MEN1, MLH1, MSH2, MSH3, MSH6, MUTYH, NBN, NF1, NF2, NTHL1,  PALB2, PMS2, PTEN, RAD51C, RAD51D, RECQL, RET, SDHA, SDHAF2, SDHB, SDHC, SDHD, SMAD4, SMARCA4, STK11, TP53, TSC1, TSC2 and VHL (sequencing and deletion/duplication); HOXB13, POLD1 and POLE (sequencing only); EPCAM and GREM1 (deletion/duplication only). RNA data is routinely analyzed for use in variant interpretation for all genes.    The test report has been scanned into EPIC and is located under the Molecular Pathology section of the Results Review tab.  A portion of the result report is included below for reference.     We discussed with Victoria Coffey that because current genetic testing is not perfect, it is possible there may be a gene mutation in one of these genes that current testing cannot detect, but that chance is small.  We also discussed, that there could be another gene that has not yet been discovered, or that we have not yet tested, that is responsible for the cancer diagnoses in the family. It is also possible there is a hereditary cause for the cancer in the family that Victoria Coffey did not inherit and therefore was not identified in her testing.  Therefore, it is important to remain in touch with cancer genetics in the future so that we can continue to offer Victoria Coffey the most up to date genetic testing.   ADDITIONAL GENETIC TESTING: We discussed with Victoria Coffey that her genetic testing was fairly extensive.  If there are genes identified to increase cancer risk that can be analyzed in the future, we would be happy to discuss and coordinate this testing at that time.    CANCER SCREENING RECOMMENDATIONS: Victoria Coffey test result is considered negative (normal).  This means that we have not identified a hereditary cause for her  personal history of cancer at this time. Most cancers happen by chance and this negative test suggests that her cancer may fall into this category.    While reassuring, this does not definitively rule out a hereditary predisposition to cancer. It is still possible that  there could be genetic mutations that are undetectable by current technology. There could be genetic mutations in genes that have not been tested or identified to increase cancer risk.  Therefore, it is recommended she continue to follow the cancer management and screening guidelines provided by her oncology and primary healthcare provider.   An individual's cancer risk and medical management are not determined by genetic test results alone. Overall cancer risk assessment incorporates additional  factors, including personal medical history, family history, and any available genetic information that may result in a personalized plan for cancer prevention and surveillance.  RECOMMENDATIONS FOR FAMILY MEMBERS:  Relatives in this family might be at some increased risk of developing cancer, over the general population risk, simply due to the family history of cancer.  We recommended female relatives in this family have a yearly mammogram beginning at age 54, or 47 years younger than the earliest onset of cancer, an annual clinical breast exam, and perform monthly breast self-exams. Female relatives in this family should also have a gynecological exam as recommended by their primary provider.  All family members should be referred for colonoscopy starting at age 52.   FOLLOW-UP: Lastly, we discussed with Victoria Coffey that cancer genetics is a rapidly advancing field and it is possible that new genetic tests will be appropriate for her and/or her family members in the future. We encouraged her to remain in contact with cancer genetics on an annual basis so we can update her personal and family histories and let her know of advances in cancer genetics that may benefit this family.   Our contact number was provided. Victoria Coffey questions were answered to her satisfaction, and she knows she is welcome to call us at anytime with additional questions or concerns.   Faith Rogue, MS, [facility] Genetic  [email or web address] Phone: [phone]

## 2021-02-03 NOTE — Telephone Encounter (Signed)
Scheduled follow-up appointments per 4/4 los. Patient is aware. ?

## 2021-02-03 NOTE — Progress Notes (Signed)
Treatment cancelled by MD r/t low PLT and ANC levels. R chest PP heparin flushed and deaccessed.

## 2021-02-04 ENCOUNTER — Telehealth: Payer: Self-pay | Admitting: Hematology

## 2021-02-04 ENCOUNTER — Inpatient Hospital Stay: Payer: BC Managed Care – PPO

## 2021-02-04 LAB — CANCER ANTIGEN 27.29: CA 27.29: 4515.2 U/mL — ABNORMAL HIGH (ref 0.0–38.6)

## 2021-02-04 NOTE — Telephone Encounter (Signed)
Scheduled follow-up appointments per 4/5 schedule message. Patient is aware of changes.

## 2021-02-04 NOTE — Telephone Encounter (Signed)
Scheduled per 4/4 sch msg. Called and spoke with pt confirmed 4/13 appts

## 2021-02-05 ENCOUNTER — Telehealth: Payer: Self-pay | Admitting: Hematology

## 2021-02-05 NOTE — Telephone Encounter (Signed)
Scheduled appt per 4/4 sch msg. Pt aware.  

## 2021-02-06 ENCOUNTER — Telehealth: Payer: Self-pay | Admitting: Radiology

## 2021-02-06 NOTE — Telephone Encounter (Signed)
[id number]: A RANDOMIZED TRIAL ADDRESSING CANCER-RELATED FINANCIAL HARDSHIP THROUGH DELIVERY OF A PROACTIVE FINANCIAL NAVIGATION INTERVENTION (CREDIT)  02/06/21    11:30AM  PHONE CALL: Confirmed I was speaking with Victoria Coffey. Informed patient this clinical research coordinator was calling to inquire about interest for the above mentioned study. Patient stated she has not had energy to review documents and this clinical research coordinator expressed understanding. Plan is to speak with patient at her next visit (Wednesday, 02/12/21) to discuss further. Thanked patient for her time and opportunity to speak with her.   Carol Ada, RT(R)(T) Clinical Research Coordinator

## 2021-02-10 ENCOUNTER — Other Ambulatory Visit: Payer: BC Managed Care – PPO

## 2021-02-10 NOTE — Progress Notes (Signed)
[place]   [phone] [phone]   Clinic Follow up Note   Patient Care Team: [name], MD as PCP - General (Infectious Diseases) [name], MD as PCP - Infectious Diseases (Infectious Diseases) [name], MD as Consulting Physician (General Surgery) [name], MD as Consulting Physician (Hematology) [name], MD as Consulting Physician (Radiation Oncology) [name], RN as Oncology Nurse Navigator [name], RN as Oncology Nurse Navigator  Date of Service:  02/12/2021  CHIEF COMPLAINT: F/u ofbilateralbreast cancer  SUMMARY OF ONCOLOGIC HISTORY: Oncology History Overview Note  Cancer Staging Bilateral breast cancer [facility]) Staging form: Breast, AJCC 8th Edition - Pathologic stage from 10/01/2020: Stage IIIC (pT3, pN2a, cM0, G3, ER-, PR-, HER2-) - Signed by Alla Feeling, NP on 10/24/2020  Malignant neoplasm of upper-outer quadrant of left breast in female, estrogen receptor positive ([place]) Staging form: Breast, AJCC 8th Edition - Clinical stage from 03/01/2020: Stage IIIC (cT4, cN0, cM0, G3, ER+, PR-, HER2-) - Signed by [name], MD on 08/22/2020 - Pathologic stage from 10/01/2020: Stage IIIC (pT4b, pN0, cM0, G3, ER-, PR-, HER2-) - Signed by Alla Feeling, NP on 10/24/2020    Malignant neoplasm of upper-outer quadrant of left breast in female, estrogen receptor negative ([place])  02/28/2020 Mammogram   Diagnostic Mammogram 02/28/20  IMPRESSION The 3.9 cm ill defined mass in the left breast posterior depth central 4.3cm to the nipple seen on the mediolateral oblique view onlt with associated difssue skin thickening is highly suspicious for malignancy.    03/01/2020 Cancer Staging   Staging form: Breast, AJCC 8th Edition - Clinical stage from 03/01/2020: Stage IIIC (cT4, cN0, cM0, G3, ER+, PR-, HER2-) - Signed by [name], MD on 08/22/2020   03/01/2020 Initial Biopsy   Diagnosis 03/01/20  1. Breast, left,  needle core biopsy, 9 o'clock, 3-4cmfn - INVASIVE DUCTAL CARCINOMA. SEE NOTE 2. Lymph node, needle/core biopsy, right axilla - INVASIVE DUCTAL CARCINOMA. SEE NOTE Diagnosis Note 1. Invasive carcinoma measures 1.5 cm in greatest linear dimension and appears grade 3. Dr. [name] reviewed the case and concurs with the diagnosis. A breast prognostic profile (ER, PR, Ki-67 and HER2) is pending and will be reported in an addendum. Dr. [name] was notified on 03/04/2020. 2. Carcinoma measures 0.6 cm in greatest linear dimension and appears grade 3. Lymphoid tissue is not identified - this may represent a completely replaced lymph node. A breast prognostic profile (ER, PR, Ki-67 and HER2) is pending and will be reported in an addendum. Dr. [name] reviewed the case and concurs with the diagnosis. Dr. [name] was notified on 03/04/2020.   03/01/2020 Receptors her2   1. PROGNOSTIC INDICATORS Results: IMMUNOHISTOCHEMICAL AND MORPHOMETRIC ANALYSIS PERFORMED MANUALLY The tumor cells are NEGATIVE for Her2 (0). Estrogen Receptor: 20%, POSITIVE, WEAK STAINING INTENSITY Progesterone Receptor: 0%, NEGATIVE Proliferation Marker Ki67: 40% COMMENT: The negative hormone receptor study(ies) in this case has an internal positive control.    2. PROGNOSTIC INDICATORS Results: IMMUNOHISTOCHEMICAL AND MORPHOMETRIC ANALYSIS PERFORMED MANUALLY The tumor cells are NEGATIVE for Her2 (1+). Estrogen Receptor: 10%, POSITIVE, WEAK STAINING INTENSITY Progesterone Receptor: 0%, NEGATIVE Proliferation Marker Ki67: 40% COMMENT: The negative hormone receptor study(ies) in this case has no internal positive control.   03/05/2020 Initial Diagnosis   Malignant neoplasm of upper-outer quadrant of left breast in female, estrogen receptor positive ([place])   03/18/2020 Breast MRI   IMPRESSION: 1. 7 x 9 x 6 cm area of suspicious non masslike enhancement within the central/UPPER RIGHT breast  with anterior skin thickening.  Given biopsy-proven metastatic RIGHT axillary lymph node, tissue sampling of the anterior and posterior aspects of this non masslike RIGHT breast enhancement is recommended to exclude malignancy. 2. 7 x 6 x 5 cm diffuse masslike and nonmasslike enhancement within the majority of the remaining LEFT breast with diffuse LEFT breast skin thickening, compatible with malignancy. Abnormal enhancement is noted along the anterior aspect of the LEFT pectoralis muscle. 3. Three abnormal RIGHT axillary lymph nodes, 1 which is biopsy proven to be metastatic disease. No abnormal LEFT axillary lymph nodes identified.   03/20/2020 Echocardiogram   Baseline ECHO  IMPRESSIONS     1. The patient was reported to be in pain during the study and not able  to participate in this study. All that can be said is that the LV and RV  function are grossly normal. Would recommend to repeat this study when/if  the patient is able to tolerate  the exam.   2. Left ventricular ejection fraction, by estimation, is 60 to 65%. The  left ventricle has normal function. Left ventricular endocardial border  not optimally defined to evaluate regional wall motion. Left ventricular  diastolic function could not be  evaluated.   3. Right ventricular systolic function is normal. The right ventricular  size is normal.   4. The mitral valve was not well visualized. No evidence of mitral valve  regurgitation.   5. The aortic valve was not well visualized. Aortic valve regurgitation  is not visualized.   6. The inferior vena cava is normal in size with greater than 50%  respiratory variability, suggesting right atrial pressure of 3 mmHg.    03/20/2020 Imaging   CT CAP W contrast  IMPRESSION: 1. Small right axillary/subpectoral lymph nodes. Difficult to exclude metastatic disease. 2. Subtle 6 mm low-attenuation lesion in the dome of the liver, not well seen previously. Lesion is too small to characterize. Further evaluation  could be performed with MR abdomen without and with contrast, as clinically indicated. 3. Hepatic steatosis. 4. Aortic atherosclerosis (ICD10-I70.0). Coronary artery calcification.     03/20/2020 Imaging   Whole body bone scan  IMPRESSION: 1. Increased activity noted over the right breast, possibly related to the patient's right breast cancer.   2. Punctate area of increased activity over the left maxillary region, most likely related to sinus disease or dental disease. Increased activity noted over the left mandible also possibly related to dental disease. No other focal bony abnormalities to suggest metastatic disease identified.   03/22/2020 Procedure   PAC placement by Dr [name]    03/22/2020 - 08/23/2020 Chemotherapy   AC q2weeks for 4 cycles 03/22/20-05/03/20 followed by weekly Taxol and Carboplatin q3weeks for 12 weeks starting 05/17/20.                  Week 2 Taxol reduced to half dose and CT was dose reduced starting with week 4 due to thrombocytopenia. Given pancytopenia, will change Carboplatin to 50% dose weekly with Taxol 22m/m2 starting with week 7 (06/13/20). Will hold Carboplatin as needed based on labs. Carboplatin increased to 80% starting with week 8. Carboplatin dose reduced for C11 and held with C12. C12 Taxol postponed to 08/23/20   03/22/2020 Pathology Results   FINAL MICROSCOPIC DIAGNOSIS:   A. BREAST, RIGHT, PUNCH BIOPSY:  - Carcinoma involving dermal lymphatics.  - See comment.   B. BREAST, LEFT, PUNCH BIOPSY:  - Carcinoma involving dermal lymphatics.  - See comment.   COMMENT:  The  morphology is compatible with ductal breast carcinoma.    ADDENDUM:   PROGNOSTIC INDICATOR RESULTS:   Immunohistochemical and morphometric analysis performed manually   The tumor cells are NEGATIVE for Her2 (0%).   Estrogen Receptor:       NEGATIVE, 0%  Progesterone Receptor:   NEGATIVE, 0%    08/13/2020 Breast MRI   IMPRESSION: 1. Interval significant response to  treatment of the diffuse non-mass enhancement involving the UPPER RIGHT breast. There is a solitary residual 5 mm indeterminate mass in the UPPER OUTER QUADRANT at MIDDLE depth with plateau kinetics. 2. Minimal response to treatment of the diffuse non-mass enhancement throughout the LEFT breast. 3. Resolution of enhancement of the skin and nipple-areolar complex of the RIGHT breast. 4. Near complete resolution of enhancement of the skin and nipple-areolar complex of the LEFT breast. Minimal residual nipple-areola complex enhancement persists. 5. The 3 previously identified pathologic RIGHT axillary lymph nodes are now normal in appearance. There is no pathologic lymphadenopathy currently.   08/23/2020 - 01/13/2021 Chemotherapy   Immunotherapy Keytruda q3weeks starting 08/23/20 for 1 year treatment. D/c on 01/13/21 given disease recurrence/progression   10/01/2020 Surgery   MASTECTOMIES WITH RIGHT SENTINEL LYMPH NODE BIOPSIES,RIGHT TARGETED RADIOACTIVE SEED LYMPH NODE, LEFT BREAST MODIFIED RADICAL MASTECTOMY  and RADIOACTIVE SEED GUIDED  TARGETED RIGHT SENTINEL LYMPH NODE BIOPSY by Dr [name]     10/01/2020 Pathology Results   FINAL MICROSCOPIC DIAGNOSIS:   A. BREAST, LEFT, MASTECTOMY:  - Invasive ductal carcinoma, 7 cm.  - Anterior and posterior soft tissue margins not involved by tumor.  - Foci of skin involvement by tumor, see comment.  - Nipple involvement by tumor.  - See oncology table and comment.   B. LYMPH NODE, LEFT AXILLARY, CONTENTS:  - One lymph node with no metastatic carcinoma (0/1).   C. BREAST, RIGHT, MASTECTOMY:  - Invasive ductal carcinoma, greater than 5 cm.  - Margins not involved.  - Subareolar breast tissue involved by tumor.  - See oncology table and comment.   D. LYMPH NODE, RIGHT #2, SENTINEL, EXCISION:  - Metastatic carcinoma in one lymph node, 0.5 cm (1/1).   E. LYMPH NODE, RIGHT #1, SENTINEL, EXCISION:  - Metastatic carcinoma in one lymph node,  1.5 cm (1/1).  - Biopsy site and biopsy clip.   F. LYMPH NODE, RIGHT #3, SENTINEL, EXCISION:  - Metastatic carcinoma in one lymph node, 1.5 cm (1/1).   G. LYMPH NODE, RIGHT #4, SENTINEL, EXCISION:  - One lymph node with no metastatic carcinoma (0/1).   H. LYMPH NODE, RIGHT #5, SENTINEL, EXCISION:  - Metastatic carcinoma in one lymph node, 0.5 cm (1/1).   I. LYMPH NODE, RIGHT #6, SENTINEL, EXCISION:  - One lymph node with no metastatic carcinoma (0/1).     A.  LEFT BREAST PROGNOSTIC INDICATOR RESULTS:   Immunohistochemical and morphometric analysis performed manually   The tumor cells are NEGATIVE for Her2 (0).   Estrogen Receptor:       0%, Negative  Progesterone Receptor:   0%, Negative  Proliferation Marker Ki-67:   80%   Comment: The negative hormone receptor study(ies) in the case has an  internal positive control.   Reference Range Estrogen and Progesterone Receptor       Negative  0%       Positive  >1%   All controls stained appropriately.   C. RIGHT BREAST PROGNOSTIC INDICATOR RESULTS:   Immunohistochemical and morphometric analysis performed manually   The tumor cells are NEGATIVE for Her2 (0).  Estrogen Receptor:       0%, Negative  Progesterone Receptor:   0%, Negative  Proliferation Marker Ki-67:   90%   10/01/2020 Cancer Staging   Staging form: Breast, AJCC 8th Edition - Pathologic stage from 10/01/2020: Stage IIIC (pT4b, pN0, cM0, G3, ER-, PR-, HER2-) - Signed by Alla Feeling, NP on 10/24/2020   10/14/2020 Surgery   RIGHT AXILLARY LYMPH NODE DISSECTION by Dr [name]  -1 /13 positive LN for metastatic carcinoma    11/18/2020 - 01/20/2021 Chemotherapy   Adjuvant Xeloda 1500 mg BID days 1-14 q21 days x6 months, starting 11/18/2020 (will reduce to 1500 mg Am/1000 mg Pm M-F during radiation). Stopped 01/20/21 due to liver mets/disease recurrence   12/10/2020 - 01/24/2021 Radiation Therapy   Adjuvant Radiation to right chest wall and axilla with Dr Lisbeth Renshaw  on 12/10/20-01/24/21   12/16/2020 Tumor Marker   CA 27.29 at 582.2 on 11/14/20   Ca 27.29 at 1387.9 on 12/16/20   01/13/2021 Progression   PET IMPRESSION: 1. Interval development of bulky hypermetabolic liver metastases involving both hepatic lobes. 2. Hypermetabolic lesion in the posterior left hemithorax. This is obscured by small pleural effusion on CT imaging and could be a pulmonary nodule or pleural nodule. Given superimposition of the pleural fluid, pleural nodule is favored. 3. Tiny subtle soft tissue nodule anterior paramidline left hemithorax with low level FDG uptake raises concern for a second site of pleural disease in the left chest. 4.  Aortic Atherosclerois (ICD10-170.0)   01/15/2021 Pathology Results   FINAL MICROSCOPIC DIAGNOSIS:   A. LIVER, RIGHT LOBE, NEEDLE CORE BIOPSY:  - Metastatic carcinoma to liver, consistent with patient's clinical  history of primary breast carcinoma.  See comment    PROGNOSTIC INDICATOR RESULTS:   Immunohistochemical and morphometric analysis performed manually   The tumor cells are NEGATIVE for Her2 (1+).   Estrogen Receptor:       POSITIVE, 20%, WEAK STAINING  Progesterone Receptor:   NEGATIVE  Proliferation Marker Ki-67:   30%    01/27/2021 -  Chemotherapy   First-line IV Trodelvy 2 weeks on/1 week off starting 01/27/21. After first dose she developed moderate pancytopenia. Her thrombocytopenia did not improve off treatment. With second dose on 02/12/21, will reduce Trodelvy dose by 50% and change to every 2 weeks.     Genetic Testing   Negative genetic testing. No pathogenic variants identified on the [facility] CustomNext+RNA panel. The report date is 01/29/2021.   The CustomNext-Cancer + RNAinsight panel  includes sequencing and/or deletion duplication testing of the following 47 genes: APC, ATM, AXIN2, BARD1, BMPR1A, BRCA1, BRCA2, BRIP1, CDH1, CDK4, CDKN2A, CHEK2, DICER1, MEN1, MLH1, MSH2, MSH3, MSH6, MUTYH, NBN, NF1, NF2, NTHL1, PALB2,  PMS2, PTEN, RAD51C, RAD51D, RECQL, RET, SDHA, SDHAF2, SDHB, SDHC, SDHD, SMAD4, SMARCA4, STK11, TP53, TSC1, TSC2 and VHL (sequencing and deletion/duplication); HOXB13, POLD1 and POLE (sequencing only); EPCAM and GREM1 (deletion/duplication only). RNA data is routinely analyzed for use in variant interpretation for all genes.      CURRENT THERAPY:  First-line IV Trodelvy 2 weeks on/1 week off starting 01/27/21. After first dose she developed moderate pancytopenia. Her thrombocytopenia did not improve off treatment. Starting with second dose on 02/12/21, Trodelvy dose by 50% and change to every 2 weeks.  INTERVAL HISTORY:  Victoria Coffey is here for a follow up. She was last seen by me on 02/03/21. She presents to the clinic alone. She notes with her additional week off treatment she has mostly adequate energy and able  to do more housework. She notes her skin is pealing from radiation. She is open to continuing this treatment if her body can tolerate it.    REVIEW OF SYSTEMS:   Constitutional: Denies fevers, chills or abnormal weight loss Eyes: Denies blurriness of vision Ears, nose, mouth, throat, and face: Denies mucositis or sore throat Respiratory: Denies cough, dyspnea or wheezes Cardiovascular: Denies palpitation, chest discomfort or lower extremity swelling Gastrointestinal:  Denies nausea, heartburn or change in bowel habits Skin: Denies abnormal skin rashes (+) Skin peeling of chest wall.  Lymphatics: Denies new lymphadenopathy or easy bruising Neurological:Denies numbness, tingling or new weaknesses Behavioral/Psych: Mood is stable, no new changes  All other systems were reviewed with the patient and are negative.   MEDICAL HISTORY:  Past Medical History:  Diagnosis Date  . Aortic atherosclerosis ([place])   . Breast cancer ([place]) dx'd 1993   left  . Fatty liver   . History of left breast cancer   . HIV infection ([place])   . Hypertension   . Port-A-Cath in place     SURGICAL  HISTORY: Past Surgical History:  Procedure Laterality Date  . AXILLARY LYMPH NODE DISSECTION Right 10/14/2020   Procedure: left axilla removal of two drains right axillary lymph node dissection;  Surgeon: [name], MD;  Location: [facility];  Service: General;  Laterality: Right;  PEC BLOCK, RNFA OR PA  . BREAST BIOPSY Bilateral 03/22/2020   Procedure: BILATERAL BREAST PUNCH BIOPSIES;  Surgeon: [name], MD;  Location: [place];  Service: General;  Laterality: Bilateral;  . BREAST LUMPECTOMY    . CESAREAN SECTION    . left breast lumpectomy  1993  . MASTECTOMY W/ SENTINEL NODE BIOPSY Bilateral 10/01/2020  . MASTECTOMY W/ SENTINEL NODE BIOPSY Bilateral 10/01/2020   Procedure: BILATERAL MASTECTOMIES WITH RIGHT SENTINEL LYMPH NODE BIOPSIES,RIGHT TARGETED RADIOACTIVE SEED LYMPH NODE, LEFT BREAST MODIFIED RADICAL MASTECTOMY ;  Surgeon: [name], MD;  Location: [place];  Service: General;  Laterality: Bilateral;  PEC BLOCK, RNFA  . PORTACATH PLACEMENT Right 03/22/2020   Procedure: INSERTION PORT-A-CATH WITH ULTRASOUND GUIDANCE;  Surgeon: [name], MD;  Location: [place];  Service: General;  Laterality: Right;  . RADIOACTIVE SEED GUIDED AXILLARY SENTINEL LYMPH NODE Right 10/01/2020   Procedure: RADIOACTIVE SEED GUIDED  TARGETED RIGHT SENTINEL LYMPH NODE BIOPSY;  Surgeon: [name], MD;  Location: [place];  Service: General;  Laterality: Right;  PEC BLOCK, RNFA    I have reviewed the social history and family history with the patient and they are unchanged from previous note.  ALLERGIES:  is allergic to codeine, other, grapefruit bioflavonoid complex, pomegranate [punica], and shellfish allergy.  MEDICATIONS:  Current Outpatient Medications  Medication Sig Dispense Refill  . acetaminophen (TYLENOL) 500 MG tablet Take 1,000 mg by mouth every 6 (six) hours as needed for moderate pain or headache.     . emtricitabine-rilpivir-tenofovir AF (ODEFSEY) 200-25-25 MG TABS tablet Take 1 tablet by  mouth daily with breakfast. 30 tablet 11  . hydrochlorothiazide (HYDRODIURIL) 25 MG tablet TAKE 1 TABLET(25 MG) BY MOUTH DAILY (Patient taking differently: Take 25 mg by mouth daily.) 30 tablet 5  . lidocaine-prilocaine (EMLA) cream Apply 1 application topically See admin instructions. Apply 1-2 hours prior to access    . loratadine (CLARITIN) 10 MG tablet Take 10 mg by mouth See admin instructions. Takes 3-4 days before and after treatment    . ondansetron (ZOFRAN) 8 MG tablet Take 1 tablet (8 mg total) by mouth every 8 (eight)  hours as needed for nausea or vomiting. 20 tablet 0  . potassium chloride SA (KLOR-CON) 20 MEQ tablet Take 1 tablet (20 mEq total) by mouth daily. 30 tablet 0  . prochlorperazine (COMPAZINE) 10 MG tablet Take 1 tablet (10 mg total) by mouth every 6 (six) hours as needed (Nausea or vomiting). 30 tablet 2  . traMADol (ULTRAM) 50 MG tablet Take 1 tablet (50 mg total) by mouth every 6 (six) hours as needed (mild pain). (Patient not taking: Reported on 01/14/2021) 30 tablet 1   No current facility-administered medications for this visit.   Facility-Administered Medications Ordered in Other Visits  Medication Dose Route Frequency Provider Last Rate Last Admin  . 0.9 %  sodium chloride infusion   Intravenous Once [name], MD      . acetaminophen (TYLENOL) tablet 650 mg  650 mg Oral Once [name], MD      . dexamethasone (DECADRON) 10 mg in sodium chloride 0.9 % 50 mL IVPB  10 mg Intravenous Once [name], MD      . diphenhydrAMINE (BENADRYL) injection 50 mg  50 mg Intravenous Once [name], MD      . famotidine (PEPCID) IVPB 20 mg premix  20 mg Intravenous Once [name], MD      . fosaprepitant (EMEND) 150 mg in sodium chloride 0.9 % 145 mL IVPB  150 mg Intravenous Once [name], MD      . heparin lock flush 100 unit/mL  500 Units Intracatheter Once PRN [name], MD      . palonosetron (ALOXI) injection 0.25 mg  0.25 mg Intravenous Once [name], MD      . sacituzumab  govitecan-hziy (TRODELVY) 280 mg in sodium chloride 0.9 % 100 mL (2.1875 mg/mL) chemo infusion  5 mg/kg (Treatment Plan Recorded) Intravenous Once [name], MD      . sodium chloride flush (NS) 0.9 % injection 10 mL  10 mL Intracatheter PRN [name], MD        PHYSICAL EXAMINATION: ECOG PERFORMANCE STATUS: 1 - Symptomatic but completely ambulatory  Vitals:   02/12/21 0857  BP: 110/90  Pulse: (!) 115  Resp: 19  Temp: (!) 96.8 F (36 C)  SpO2: 100%   Filed Weights   02/12/21 0857  Weight: 121 lb 14.4 oz (55.3 kg)    GENERAL:alert, no distress and comfortable SKIN: skin color, texture, turgor are normal, no rashes or significant lesions EYES: normal, Conjunctiva are pink and non-injected, sclera clear  NECK: supple, thyroid normal size, non-tender, without nodularity LYMPH:  no palpable lymphadenopathy in the cervical, axillary  LUNGS: clear to auscultation and percussion with normal breathing effort HEART: regular rate & rhythm and no murmurs and no lower extremity edema ABDOMEN:abdomen soft, non-tender and normal bowel sounds Musculoskeletal:no cyanosis of digits and no clubbing  NEURO: alert & oriented x 3 with fluent speech, no focal motor/sensory deficits BREAST: s/p b/l mastectomy with breasts surgically absent: surgical incision healed well.  Diffuse skin hyperpigmentation on chest wall and left axilla.  (+) Right chest skin peeling and a few area with pink soft tissue but no open wound or discharge.  No palpable mass, nodules or adenopathy bilaterally.   LABORATORY DATA:  I have reviewed the data as listed CBC Latest Ref Rng & Units 02/12/2021 02/03/2021 01/27/2021  WBC 4.0 - 10.5 K/uL 2.7(L) 1.0(L) 4.0  Hemoglobin 12.0 - 15.0 g/dL 10.7(L) 10.0(L) 11.1(L)  Hematocrit 36.0 - 46.0 % 30.9(L) 28.5(L) 31.1(L)  Platelets 150 - 400  K/uL 69(L) 74(L) 121(L)     CMP Latest Ref Rng & Units 02/12/2021 02/03/2021 01/27/2021  Glucose 70 - 99 mg/dL 110(H) 114(H) 106(H)  BUN 8 - 23 mg/dL  _0 Creatinine 0.44 - 1.00 mg/dL 0.85 0.99 1.01(H)  Sodium 135 - 145 mmol/L 134(L) 134(L) 133(L)  Potassium 3.5 - 5.1 mmol/L 3.5 5.8(H) 4.7  Chloride 98 - 111 mmol/L 103 106 101  CO2 22 - 32 mmol/L 18(L) 17(L) 20(L)  Calcium 8.9 - 10.3 mg/dL 8.5(L) 8.3(L) 8.4(L)  Total Protein 6.5 - 8.1 g/dL 6.4(L) 6.5 6.9  Total Bilirubin 0.3 - 1.2 mg/dL 1.3(H) 0.8 0.7  Alkaline Phos 38 - 126 U/L 1,682(H) 871(H) 864(H)  AST 15 - 41 U/L 246(HH) 127(H) 187(HH)  ALT 0 - 44 U/L 131(H) 105(H) 112(H)      RADIOGRAPHIC STUDIES: I have personally reviewed the radiological images as listed and agreed with the findings in the report. No results found.   ASSESSMENT & PLAN:  Victoria Coffey is a 65 y.o. female with    1.InflammatoryLeft breast cancer,pT4N0Mx,ypT4N0,triple negativeAND Right breast cancer withaxillary node metastasis,pT3N2aM0,ERweakly+,PR/HER2-,ypT3N2a, triple negative,GradeIII, diffuse liver metastasis(ER weakly+/PR-/HER-)in 12/2020 -She was diagnosed with left breast cancer in 02/2020.She presented with inflammatory left breast cancer. Biopsy of left breast mass and right axillary LN were positive for Invasive ductal carcinoma.Further work up showed 9cm nodular area in right breast and skin biopsy confirmed cancer involvement.Staging scansin May 2021were negative for distant metastasis. -She completed AC X4 and weekly TC 03/22/20-08/23/20. She underwent b/l mastectomy with Dr [name] on 10/01/20.Shealsounderwent right axillary lymph node dissection on 10/14/2020 and completed Adjuvant radiation with Dr Moody2/8/22-3/25/22. -Given recentpublish [employer] 522 trial data,I started her on Immunotherapy Keytruda q3weeks for 1 year of treatment beginning 08/23/20.Due tohersignificantresidual diseasein LNs,Istarted her onadjuvant Xelodafor 6 months on 11/18/20. Unfortunately she progressed/recurred with diffuse liver mets on 01/13/21 PET and 01/15/21 Liver biopsy (Her/PR  negative/ER 20% weakly positive).Keytruda/Xeloda d/c.  -I have requested [facility] one on her biopsy sample, results still pending.  -I started her on first-line IV Trodelvy 2 weeks on/1 week off beginning 01/27/21. After first dose she developed moderate pancytopenia. Her thrombocytopenia did not improve off treatment.Will reduce Trodelvy dose by 50% and change to every 2 weeks starting 02/12/21 (second dose).  Neutropenia has improved.  May start her on oral Promacta.  -Labs reviewed, WBC 2.7, Hg 10.7, plt 69K, ANC 1.2. Will proceed with Ivette Loyal with 50% dose reduction. If labs are able to maintain may consider dose increase to 75%.  -F/u in 2 weeks.   2.Cytopenias, Moderate Thrombocytopenia -secondary to chemo -Mild and stableanemia and intermittent leukopenia.Continue monitoring labs weekly -After C1D1 Trodelvy her blood counts dropped to 74K plt, 1.0 WBC and 0.5 ANC (02/03/21). After being off Trodelvy for 2 weeks, her WBC improved to 2.7, Hg improved to 10.7, ANC improved to 1.2, but her plt remain low at 69K.  -Will reduce Trodelvy dose and change to every 2 weeks starting 02/12/21. Will only give GCSF as needed. -I also discussed the option of oral Promacta to help improve platelet level. If not approved by insurance will try for drug replacement. In the meantime, I advised her to watch for bleeding.   3. H/o Left breast cancer, Dx in 1993s/p lumpectomy, radiation, chemo, Tamoxifen.Genetic Testing from 01/14/21 was negative for pathogenetic mutations  4. Comorbidities: HIV(+)Dx 1993, HTN -Her HIV has been undetectable and well controlled. She is currently being seen by ID Dr Graylon Good -Ipreviouslyreferredher for Evusheld injection for COVID  prevention  5.transaminitis, secondary toliver metastasis -We will monitor -Since she started chemotherapy, LFTs slightly improved the last week. -Today's CMP showed a worsening AST, alkaline phosphatase and slightly elevated bilirubin,  likely from liver metastasis -We will proceed chemo today  PLAN: -Labs reviewed, Proceed with Ivette Loyal today with 50% dose reduction  -GCSF on day 2 or 3 -Lab on 4/18 -Lab, flush, F/u and Trodelvy in 2 weeks.  Will use GCSF if ANC less than 1.5 -will call in promacta    No problem-specific Assessment & Plan notes found for this encounter.   No orders of the defined types were placed in this encounter.  All questions were answered. The patient knows to call the clinic with any problems, questions or concerns. No barriers to learning was detected. The total time spent in the appointment was 30 minutes.     [name], MD 02/12/2021   I, Joslyn Devon, am acting as scribe for [name], MD.   I have reviewed the above documentation for accuracy and completeness, and I agree with the above.

## 2021-02-11 ENCOUNTER — Other Ambulatory Visit: Payer: BC Managed Care – PPO

## 2021-02-11 ENCOUNTER — Ambulatory Visit: Payer: BC Managed Care – PPO | Admitting: Hematology

## 2021-02-12 ENCOUNTER — Other Ambulatory Visit: Payer: BC Managed Care – PPO

## 2021-02-12 ENCOUNTER — Other Ambulatory Visit: Payer: Self-pay

## 2021-02-12 ENCOUNTER — Telehealth: Payer: Self-pay | Admitting: Hematology

## 2021-02-12 ENCOUNTER — Telehealth: Payer: Self-pay

## 2021-02-12 ENCOUNTER — Inpatient Hospital Stay: Payer: BC Managed Care – PPO

## 2021-02-12 ENCOUNTER — Inpatient Hospital Stay: Payer: BC Managed Care – PPO | Admitting: Nutrition

## 2021-02-12 ENCOUNTER — Inpatient Hospital Stay (HOSPITAL_BASED_OUTPATIENT_CLINIC_OR_DEPARTMENT_OTHER): Payer: BC Managed Care – PPO | Admitting: Hematology

## 2021-02-12 ENCOUNTER — Encounter: Payer: Self-pay | Admitting: Hematology

## 2021-02-12 ENCOUNTER — Other Ambulatory Visit (HOSPITAL_COMMUNITY): Payer: Self-pay

## 2021-02-12 ENCOUNTER — Encounter: Payer: Self-pay | Admitting: Radiology

## 2021-02-12 VITALS — BP 110/90 | HR 115 | Temp 96.8°F | Resp 19 | Ht 60.0 in | Wt 121.9 lb

## 2021-02-12 DIAGNOSIS — C50412 Malignant neoplasm of upper-outer quadrant of left female breast: Secondary | ICD-10-CM | POA: Diagnosis not present

## 2021-02-12 DIAGNOSIS — Z5112 Encounter for antineoplastic immunotherapy: Secondary | ICD-10-CM | POA: Diagnosis not present

## 2021-02-12 DIAGNOSIS — Z170 Estrogen receptor positive status [ER+]: Secondary | ICD-10-CM

## 2021-02-12 LAB — CBC WITH DIFFERENTIAL (CANCER CENTER ONLY)
Abs Immature Granulocytes: 0 10*3/uL (ref 0.00–0.07)
Basophils Absolute: 0 10*3/uL (ref 0.0–0.1)
Basophils Relative: 0 %
Eosinophils Absolute: 0 10*3/uL (ref 0.0–0.5)
Eosinophils Relative: 0 %
HCT: 30.9 % — ABNORMAL LOW (ref 36.0–46.0)
Hemoglobin: 10.7 g/dL — ABNORMAL LOW (ref 12.0–15.0)
Immature Granulocytes: 0 %
Lymphocytes Relative: 43 %
Lymphs Abs: 1.1 10*3/uL (ref 0.7–4.0)
MCH: 32.1 pg (ref 26.0–34.0)
MCHC: 34.6 g/dL (ref 30.0–36.0)
MCV: 92.8 fL (ref 80.0–100.0)
Monocytes Absolute: 0.3 10*3/uL (ref 0.1–1.0)
Monocytes Relative: 13 %
Neutro Abs: 1.2 10*3/uL — ABNORMAL LOW (ref 1.7–7.7)
Neutrophils Relative %: 44 %
Platelet Count: 69 10*3/uL — ABNORMAL LOW (ref 150–400)
RBC: 3.33 MIL/uL — ABNORMAL LOW (ref 3.87–5.11)
WBC Count: 2.7 10*3/uL — ABNORMAL LOW (ref 4.0–10.5)
nRBC: 0 % (ref 0.0–0.2)

## 2021-02-12 LAB — CMP (CANCER CENTER ONLY)
ALT: 131 U/L — ABNORMAL HIGH (ref 0–44)
AST: 246 U/L (ref 15–41)
Albumin: 2.4 g/dL — ABNORMAL LOW (ref 3.5–5.0)
Alkaline Phosphatase: 1682 U/L — ABNORMAL HIGH (ref 38–126)
Anion gap: 13 (ref 5–15)
BUN: 10 mg/dL (ref 8–23)
CO2: 18 mmol/L — ABNORMAL LOW (ref 22–32)
Calcium: 8.5 mg/dL — ABNORMAL LOW (ref 8.9–10.3)
Chloride: 103 mmol/L (ref 98–111)
Creatinine: 0.85 mg/dL (ref 0.44–1.00)
GFR, Estimated: >60 mL/min (ref >60)
Glucose, Bld: 110 mg/dL — ABNORMAL HIGH (ref 70–99)
Potassium: 3.5 mmol/L (ref 3.5–5.1)
Sodium: 134 mmol/L — ABNORMAL LOW (ref 135–145)
Total Bilirubin: 1.3 mg/dL — ABNORMAL HIGH (ref 0.3–1.2)
Total Protein: 6.4 g/dL — ABNORMAL LOW (ref 6.5–8.1)

## 2021-02-12 MED ORDER — ACETAMINOPHEN 325 MG PO TABS
ORAL_TABLET | ORAL | Status: AC
  Filled 2021-02-12: qty 2

## 2021-02-12 MED ORDER — ELTROMBOPAG OLAMINE 50 MG PO TABS
50.0000 mg | ORAL_TABLET | Freq: Every day | ORAL | 2 refills | Status: DC
  Filled 2021-02-12: qty 30, 30d supply, fill #0

## 2021-02-12 MED ORDER — FAMOTIDINE IN NACL 20-0.9 MG/50ML-% IV SOLN
20.0000 mg | Freq: Once | INTRAVENOUS | Status: AC
  Administered 2021-02-12: 20 mg via INTRAVENOUS

## 2021-02-12 MED ORDER — ACETAMINOPHEN 325 MG PO TABS: 650.0000 mg | ORAL_TABLET | Freq: Once | ORAL | Status: DC

## 2021-02-12 MED ORDER — DIPHENHYDRAMINE HCL 50 MG/ML IJ SOLN
50.0000 mg | Freq: Once | INTRAMUSCULAR | Status: AC
  Administered 2021-02-12: 50 mg via INTRAVENOUS

## 2021-02-12 MED ORDER — SODIUM CHLORIDE 0.9 % IV SOLN
5.0000 mg/kg | Freq: Once | INTRAVENOUS | Status: AC
  Administered 2021-02-12: 280 mg via INTRAVENOUS
  Filled 2021-02-12: qty 28

## 2021-02-12 MED ORDER — DEXAMETHASONE SODIUM PHOSPHATE 100 MG/10ML IJ SOLN
10.0000 mg | Freq: Once | INTRAMUSCULAR | Status: AC
  Administered 2021-02-12: 10 mg via INTRAVENOUS
  Filled 2021-02-12: qty 10

## 2021-02-12 MED ORDER — PALONOSETRON HCL INJECTION 0.25 MG/5ML
0.2500 mg | Freq: Once | INTRAVENOUS | Status: AC
  Administered 2021-02-12: 0.25 mg via INTRAVENOUS

## 2021-02-12 MED ORDER — PALONOSETRON HCL INJECTION 0.25 MG/5ML
INTRAVENOUS | Status: AC
  Filled 2021-02-12: qty 5

## 2021-02-12 MED ORDER — DIPHENHYDRAMINE HCL 50 MG/ML IJ SOLN
INTRAMUSCULAR | Status: AC
  Filled 2021-02-12: qty 1

## 2021-02-12 MED ORDER — SODIUM CHLORIDE 0.9 % IV SOLN
Freq: Once | INTRAVENOUS | Status: AC
Start: 2021-02-12 — End: 2021-02-12
  Filled 2021-02-12: qty 250

## 2021-02-12 MED ORDER — FAMOTIDINE IN NACL 20-0.9 MG/50ML-% IV SOLN
INTRAVENOUS | Status: AC
  Filled 2021-02-12: qty 50

## 2021-02-12 MED ORDER — HEPARIN SOD (PORK) LOCK FLUSH 100 UNIT/ML IV SOLN
500.0000 [IU] | Freq: Once | INTRAVENOUS | Status: AC | PRN
  Administered 2021-02-12: 500 [IU]
  Filled 2021-02-12: qty 5

## 2021-02-12 MED ORDER — FOSAPREPITANT DIMEGLUMINE INJECTION 150 MG
150.0000 mg | Freq: Once | INTRAVENOUS | Status: AC
  Administered 2021-02-12: 150 mg via INTRAVENOUS
  Filled 2021-02-12: qty 150

## 2021-02-12 MED ORDER — SODIUM CHLORIDE 0.9% FLUSH
10.0000 mL | INTRAVENOUS | Status: DC | PRN
  Administered 2021-02-12: 10 mL
  Filled 2021-02-12: qty 10

## 2021-02-12 NOTE — Telephone Encounter (Signed)
Scheduled follow-up appointment per 4/13 los. Patient is aware.

## 2021-02-12 NOTE — Telephone Encounter (Signed)
CRITICAL VALUE STICKER  CRITICAL VALUE: AST = [phone]  RECEIVER (on-site recipient of call): Yetta Glassman, CMA  DATE & TIME NOTIFIED: 02/12/21 at 9:32am  MESSENGER (representative from lab): Verdis Frederickson  MD NOTIFIED: Dr. Burr Medico  TIME OF NOTIFICATION: 02/12/21 at 9:32am  RESPONSE: Notification given to Tollie Pizza, RN for follow-up with provider and pt.

## 2021-02-12 NOTE — Addendum Note (Signed)
Addended by: [name] on: 02/12/2021 01:01 PM   Modules accepted: Orders

## 2021-02-12 NOTE — Patient Instructions (Signed)
[facility] Discharge Instructions for Patients Receiving Chemotherapy  Today you received the following chemotherapy agents; Victoria Coffey  To help prevent nausea and vomiting after your treatment, we encourage you to take your nausea medication as directed.   If you develop nausea and vomiting that is not controlled by your nausea medication, call the clinic.   BELOW ARE SYMPTOMS THAT SHOULD BE REPORTED IMMEDIATELY:  *FEVER GREATER THAN 100.5 F  *CHILLS WITH OR WITHOUT FEVER  NAUSEA AND VOMITING THAT IS NOT CONTROLLED WITH YOUR NAUSEA MEDICATION  *UNUSUAL SHORTNESS OF BREATH  *UNUSUAL BRUISING OR BLEEDING  TENDERNESS IN MOUTH AND THROAT WITH OR WITHOUT PRESENCE OF ULCERS  *URINARY PROBLEMS  *BOWEL PROBLEMS  UNUSUAL RASH Items with * indicate a potential emergency and should be followed up as soon as possible.  Feel free to call the clinic should you have any questions or concerns. The clinic phone number is [phone].  Please show the [place] at check-in to the Emergency Department and triage nurse.

## 2021-02-12 NOTE — Telephone Encounter (Signed)
Critical value: AST [id number] Dr Burr Medico notified

## 2021-02-12 NOTE — Research (Signed)
DCP-001: Use of a Clinical Trial Screening Tool to Address Cancer Health Disparities in the [facility] ([facility])  02/12/2021    10:45AM  VISIT: After discussing [place] trial, this clinical research coordinator introduced the above mentioned trial.This clinical research coordinator discussed the purpose, one time nature of the study, and the voluntary nature of the study. Patient was provided the option of taking informed consent documents home to review and was encouraged to review at their convenience with their support network, including other care providers. Patient is not interested in participation at this time. I thanked patient for her time and expressed understanding.   Carol Ada, RT(R)(T) Clinical Research Coordinator

## 2021-02-12 NOTE — Telephone Encounter (Signed)
Oral Oncology Patient Advocate Encounter  Received notification from [facility] that prior authorization for Promacta is required.  PA submitted on CoverMyMeds Key [id number] Status is pending  Oral Oncology Clinic will continue to follow.  [facility] Patient [place] Phone [phone] Fax [phone] 02/12/2021 12:31 PM

## 2021-02-12 NOTE — Progress Notes (Signed)
Okay to treat with today's lab values per Dr. Burr Medico.

## 2021-02-12 NOTE — Research (Signed)
[id number]: A RANDOMIZED TRIAL ADDRESSING CANCER-RELATED FINANCIAL HARDSHIP THROUGH DELIVERY OF A PROACTIVE FINANCIAL NAVIGATION INTERVENTION (CREDIT)  02/12/2021     10:00AM  VISIT: Met with Victoria Coffey in the infusion room for 45 minutes to discuss potential interest and/or participation in the above mentioned trial. After discussing multiple aspects of finances and the purpose of the study, patient felt that this study would not offer a benefit to her at this time. Patient is very concerned about a medication that her insurance may not pay for, so this clinical research coordinator reached out to financial patient advocates to offer additional assistance to the patient. Patient was thanked for her time and opportunity to speak with her.   Carol Ada, RT(R)(T) Clinical Research Coordinator

## 2021-02-13 ENCOUNTER — Inpatient Hospital Stay: Payer: BC Managed Care – PPO

## 2021-02-13 VITALS — BP 107/85 | HR 100 | Resp 18

## 2021-02-13 DIAGNOSIS — C50412 Malignant neoplasm of upper-outer quadrant of left female breast: Secondary | ICD-10-CM

## 2021-02-13 DIAGNOSIS — Z95828 Presence of other vascular implants and grafts: Secondary | ICD-10-CM

## 2021-02-13 DIAGNOSIS — Z5112 Encounter for antineoplastic immunotherapy: Secondary | ICD-10-CM | POA: Diagnosis not present

## 2021-02-13 DIAGNOSIS — Z170 Estrogen receptor positive status [ER+]: Secondary | ICD-10-CM

## 2021-02-13 MED ORDER — PEGFILGRASTIM-JMDB 6 MG/0.6ML ~~LOC~~ SOSY: 6.0000 mg | PREFILLED_SYRINGE | Freq: Once | SUBCUTANEOUS | Status: DC

## 2021-02-13 MED ORDER — PEGFILGRASTIM-JMDB 6 MG/0.6ML ~~LOC~~ SOSY
PREFILLED_SYRINGE | SUBCUTANEOUS | Status: AC
  Filled 2021-02-13: qty 0.6

## 2021-02-13 MED ORDER — PEGFILGRASTIM-CBQV 6 MG/0.6ML ~~LOC~~ SOSY
PREFILLED_SYRINGE | SUBCUTANEOUS | Status: AC
  Filled 2021-02-13: qty 0.6

## 2021-02-13 MED ORDER — ROMIPLOSTIM 125 MCG ~~LOC~~ SOLR: 1.0000 ug/kg | Freq: Once | SUBCUTANEOUS | Status: DC

## 2021-02-13 MED ORDER — PEGFILGRASTIM-CBQV 6 MG/0.6ML ~~LOC~~ SOSY
6.0000 mg | PREFILLED_SYRINGE | Freq: Once | SUBCUTANEOUS | Status: AC
  Administered 2021-02-13: 6 mg via SUBCUTANEOUS

## 2021-02-13 NOTE — Patient Instructions (Addendum)
Pegfilgrastim injection What is this medicine? PEGFILGRASTIM (PEG fil gra stim) is a long-acting granulocyte colony-stimulating factor that stimulates the growth of neutrophils, a type of white blood cell important in the body's fight against infection. It is used to reduce the incidence of fever and infection in patients with certain types of cancer who are receiving chemotherapy that affects the bone marrow, and to increase survival after being exposed to high doses of radiation. This medicine may be used for other purposes; ask your health care provider or pharmacist if you have questions. COMMON BRAND NAME(S): Fulphila, Neulasta, Nyvepria, UDENYCA, Ziextenzo What should I tell my health care provider before I take this medicine? They need to know if you have any of these conditions:  kidney disease  latex allergy  ongoing radiation therapy  sickle cell disease  skin reactions to acrylic adhesives (On-Body Injector only)  an unusual or allergic reaction to pegfilgrastim, filgrastim, other medicines, foods, dyes, or preservatives  pregnant or trying to get pregnant  breast-feeding How should I use this medicine? This medicine is for injection under the skin. If you get this medicine at home, you will be taught how to prepare and give the pre-filled syringe or how to use the On-body Injector. Refer to the patient Instructions for Use for detailed instructions. Use exactly as directed. Tell your healthcare provider immediately if you suspect that the On-body Injector may not have performed as intended or if you suspect the use of the On-body Injector resulted in a missed or partial dose. It is important that you put your used needles and syringes in a special sharps container. Do not put them in a trash can. If you do not have a sharps container, call your pharmacist or healthcare provider to get one. Talk to your pediatrician regarding the use of this medicine in children. While this drug  may be prescribed for selected conditions, precautions do apply. Overdosage: If you think you have taken too much of this medicine contact a poison control center or emergency room at once. NOTE: This medicine is only for you. Do not share this medicine with others. What if I miss a dose? It is important not to miss your dose. Call your doctor or health care professional if you miss your dose. If you miss a dose due to an On-body Injector failure or leakage, a new dose should be administered as soon as possible using a single prefilled syringe for manual use. What may interact with this medicine? Interactions have not been studied. This list may not describe all possible interactions. Give your health care provider a list of all the medicines, herbs, non-prescription drugs, or dietary supplements you use. Also tell them if you smoke, drink alcohol, or use illegal drugs. Some items may interact with your medicine. What should I watch for while using this medicine? Your condition will be monitored carefully while you are receiving this medicine. You may need blood work done while you are taking this medicine. Talk to your health care provider about your risk of cancer. You may be more at risk for certain types of cancer if you take this medicine. If you are going to need a MRI, CT scan, or other procedure, tell your doctor that you are using this medicine (On-Body Injector only). What side effects may I notice from receiving this medicine? Side effects that you should report to your doctor or health care professional as soon as possible:  allergic reactions (skin rash, itching or hives, swelling of   the face, lips, or tongue)  back pain  dizziness  fever  pain, redness, or irritation at site where injected  pinpoint red spots on the skin  red or dark-brown urine  shortness of breath or breathing problems  stomach or side pain, or pain at the shoulder  swelling  tiredness  trouble  passing urine or change in the amount of urine  unusual bruising or bleeding Side effects that usually do not require medical attention (report to your doctor or health care professional if they continue or are bothersome):  bone pain  muscle pain This list may not describe all possible side effects. Call your doctor for medical advice about side effects. You may report side effects to FDA at 1-800-FDA-1088. Where should I keep my medicine? Keep out of the reach of children. If you are using this medicine at home, you will be instructed on how to store it. Throw away any unused medicine after the expiration date on the label. NOTE: This sheet is a summary. It may not cover all possible information. If you have questions about this medicine, talk to your doctor, pharmacist, or health care provider.  2021 Elsevier/Gold Standard (2019-11-10 13:20:51)  

## 2021-02-17 ENCOUNTER — Inpatient Hospital Stay: Payer: BC Managed Care – PPO

## 2021-02-17 ENCOUNTER — Telehealth: Payer: Self-pay

## 2021-02-17 ENCOUNTER — Other Ambulatory Visit: Payer: Self-pay

## 2021-02-17 DIAGNOSIS — C50412 Malignant neoplasm of upper-outer quadrant of left female breast: Secondary | ICD-10-CM

## 2021-02-17 DIAGNOSIS — Z95828 Presence of other vascular implants and grafts: Secondary | ICD-10-CM

## 2021-02-17 DIAGNOSIS — Z5112 Encounter for antineoplastic immunotherapy: Secondary | ICD-10-CM | POA: Diagnosis not present

## 2021-02-17 LAB — CBC WITH DIFFERENTIAL (CANCER CENTER ONLY)
Abs Immature Granulocytes: 0.19 10*3/uL — ABNORMAL HIGH (ref 0.00–0.07)
Basophils Absolute: 0.1 10*3/uL (ref 0.0–0.1)
Basophils Relative: 1 %
Eosinophils Absolute: 0 10*3/uL (ref 0.0–0.5)
Eosinophils Relative: 0 %
HCT: 28.2 % — ABNORMAL LOW (ref 36.0–46.0)
Hemoglobin: 10.2 g/dL — ABNORMAL LOW (ref 12.0–15.0)
Immature Granulocytes: 2 %
Lymphocytes Relative: 11 %
Lymphs Abs: 0.9 10*3/uL (ref 0.7–4.0)
MCH: 33.6 pg (ref 26.0–34.0)
MCHC: 36.2 g/dL — ABNORMAL HIGH (ref 30.0–36.0)
MCV: 92.8 fL (ref 80.0–100.0)
Monocytes Absolute: 0.8 10*3/uL (ref 0.1–1.0)
Monocytes Relative: 11 %
Neutro Abs: 5.9 10*3/uL (ref 1.7–7.7)
Neutrophils Relative %: 75 %
Platelet Count: 38 10*3/uL — ABNORMAL LOW (ref 150–400)
RBC: 3.04 MIL/uL — ABNORMAL LOW (ref 3.87–5.11)
RDW: 24.1 % — ABNORMAL HIGH (ref 11.5–15.5)
WBC Count: 7.9 10*3/uL (ref 4.0–10.5)
WBC Morphology: INCREASED
nRBC: 0.4 % — ABNORMAL HIGH (ref 0.0–0.2)

## 2021-02-17 LAB — CMP (CANCER CENTER ONLY)
ALT: 126 U/L — ABNORMAL HIGH (ref 0–44)
AST: 207 U/L (ref 15–41)
Albumin: 2.3 g/dL — ABNORMAL LOW (ref 3.5–5.0)
Alkaline Phosphatase: 1773 U/L — ABNORMAL HIGH (ref 38–126)
Anion gap: 10 (ref 5–15)
BUN: 18 mg/dL (ref 8–23)
CO2: 23 mmol/L (ref 22–32)
Calcium: 8.5 mg/dL — ABNORMAL LOW (ref 8.9–10.3)
Chloride: 101 mmol/L (ref 98–111)
Creatinine: 0.82 mg/dL (ref 0.44–1.00)
GFR, Estimated: >60 mL/min (ref >60)
Glucose, Bld: 121 mg/dL — ABNORMAL HIGH (ref 70–99)
Potassium: 4.3 mmol/L (ref 3.5–5.1)
Sodium: 134 mmol/L — ABNORMAL LOW (ref 135–145)
Total Bilirubin: 1.9 mg/dL — ABNORMAL HIGH (ref 0.3–1.2)
Total Protein: 6 g/dL — ABNORMAL LOW (ref 6.5–8.1)

## 2021-02-17 MED ORDER — HEPARIN SOD (PORK) LOCK FLUSH 100 UNIT/ML IV SOLN
500.0000 [IU] | Freq: Once | INTRAVENOUS | Status: AC
Start: 2021-02-17 — End: 2021-02-17
  Administered 2021-02-17: 500 [IU]
  Filled 2021-02-17: qty 5

## 2021-02-17 MED ORDER — SODIUM CHLORIDE 0.9% FLUSH
10.0000 mL | Freq: Once | INTRAVENOUS | Status: AC
  Administered 2021-02-17: 10 mL
  Filled 2021-02-17: qty 10

## 2021-02-17 NOTE — Telephone Encounter (Signed)
CRITICAL VALUE STICKER  CRITICAL VALUE: AST: 207  RECEIVER (on-site recipient of call): Evette Georges, [place] NOTIFIED: 8:52 am  MESSENGER (representative from lab): Pam  MD NOTIFIED: Sandi Mealy, PA  TIME OF NOTIFICATION: 8:54 am  RESPONSE: Sharlynn Oliphant, RN to give to Sandi Mealy, PA in [name] absence

## 2021-02-19 ENCOUNTER — Other Ambulatory Visit: Payer: BC Managed Care – PPO

## 2021-02-19 ENCOUNTER — Ambulatory Visit: Payer: BC Managed Care – PPO

## 2021-02-21 ENCOUNTER — Ambulatory Visit: Payer: BC Managed Care – PPO

## 2021-02-24 NOTE — Telephone Encounter (Signed)
Oral Oncology Patient Advocate Encounter  Received notification from [facility] that the request for prior authorization for Promacta has been denied.       This encounter will continue to be updated until final determination.    [facility] Patient [place] Phone [phone] Fax [phone] 02/24/2021 10:43 AM

## 2021-02-26 ENCOUNTER — Ambulatory Visit (HOSPITAL_COMMUNITY)
Admission: RE | Admit: 2021-02-26 | Discharge: 2021-02-26 | Disposition: A | Discharge status: Home / Self Care | Payer: BC Managed Care – PPO | Source: Ambulatory Visit | Attending: Hematology | Admitting: Hematology

## 2021-02-26 ENCOUNTER — Other Ambulatory Visit: Payer: BC Managed Care – PPO

## 2021-02-26 ENCOUNTER — Inpatient Hospital Stay: Payer: BC Managed Care – PPO

## 2021-02-26 ENCOUNTER — Other Ambulatory Visit: Payer: Self-pay

## 2021-02-26 ENCOUNTER — Telehealth: Payer: Self-pay

## 2021-02-26 ENCOUNTER — Encounter: Payer: Self-pay | Admitting: Hematology

## 2021-02-26 ENCOUNTER — Inpatient Hospital Stay (HOSPITAL_BASED_OUTPATIENT_CLINIC_OR_DEPARTMENT_OTHER): Payer: BC Managed Care – PPO | Admitting: Hematology

## 2021-02-26 VITALS — BP 122/94 | HR 98 | Temp 97.7°F | Resp 18 | Wt 120.0 lb

## 2021-02-26 DIAGNOSIS — Z170 Estrogen receptor positive status [ER+]: Secondary | ICD-10-CM | POA: Insufficient documentation

## 2021-02-26 DIAGNOSIS — C50412 Malignant neoplasm of upper-outer quadrant of left female breast: Secondary | ICD-10-CM | POA: Diagnosis present

## 2021-02-26 DIAGNOSIS — Z95828 Presence of other vascular implants and grafts: Secondary | ICD-10-CM

## 2021-02-26 DIAGNOSIS — Z5112 Encounter for antineoplastic immunotherapy: Secondary | ICD-10-CM | POA: Diagnosis not present

## 2021-02-26 LAB — CMP (CANCER CENTER ONLY)
ALT: 90 U/L — ABNORMAL HIGH (ref 0–44)
AST: 295 U/L (ref 15–41)
Albumin: 2.1 g/dL — ABNORMAL LOW (ref 3.5–5.0)
Alkaline Phosphatase: 2308 U/L — ABNORMAL HIGH (ref 38–126)
Anion gap: 12 (ref 5–15)
BUN: 14 mg/dL (ref 8–23)
CO2: 18 mmol/L — ABNORMAL LOW (ref 22–32)
Calcium: 8.5 mg/dL — ABNORMAL LOW (ref 8.9–10.3)
Chloride: 101 mmol/L (ref 98–111)
Creatinine: 0.82 mg/dL (ref 0.44–1.00)
GFR, Estimated: >60 mL/min (ref >60)
Glucose, Bld: 107 mg/dL — ABNORMAL HIGH (ref 70–99)
Potassium: 4.1 mmol/L (ref 3.5–5.1)
Sodium: 131 mmol/L — ABNORMAL LOW (ref 135–145)
Total Bilirubin: 4.8 mg/dL (ref 0.3–1.2)
Total Protein: 6 g/dL — ABNORMAL LOW (ref 6.5–8.1)

## 2021-02-26 LAB — CBC WITH DIFFERENTIAL (CANCER CENTER ONLY)
Abs Immature Granulocytes: 0.06 10*3/uL (ref 0.00–0.07)
Basophils Absolute: 0 10*3/uL (ref 0.0–0.1)
Basophils Relative: 0 %
Eosinophils Absolute: 0 10*3/uL (ref 0.0–0.5)
Eosinophils Relative: 0 %
HCT: 29.2 % — ABNORMAL LOW (ref 36.0–46.0)
Hemoglobin: 9.6 g/dL — ABNORMAL LOW (ref 12.0–15.0)
Immature Granulocytes: 1 %
Lymphocytes Relative: 12 %
Lymphs Abs: 1.2 10*3/uL (ref 0.7–4.0)
MCH: 34 pg (ref 26.0–34.0)
MCHC: 32.9 g/dL (ref 30.0–36.0)
MCV: 103.5 fL — ABNORMAL HIGH (ref 80.0–100.0)
Monocytes Absolute: 1 10*3/uL (ref 0.1–1.0)
Monocytes Relative: 10 %
Neutro Abs: 7.4 10*3/uL (ref 1.7–7.7)
Neutrophils Relative %: 77 %
Platelet Count: 73 10*3/uL — ABNORMAL LOW (ref 150–400)
RBC: 2.82 MIL/uL — ABNORMAL LOW (ref 3.87–5.11)
RDW: 19.7 % — ABNORMAL HIGH (ref 11.5–15.5)
WBC Count: 9.7 10*3/uL (ref 4.0–10.5)
nRBC: 0.2 % (ref 0.0–0.2)

## 2021-02-26 MED ORDER — SODIUM CHLORIDE 0.9% FLUSH
10.0000 mL | Freq: Once | INTRAVENOUS | Status: AC
  Administered 2021-02-26: 10 mL
  Filled 2021-02-26: qty 10

## 2021-02-26 MED ORDER — HEPARIN SOD (PORK) LOCK FLUSH 100 UNIT/ML IV SOLN
500.0000 [IU] | Freq: Once | INTRAVENOUS | Status: AC
Start: 2021-02-26 — End: 2021-02-26
  Administered 2021-02-26: 500 [IU]
  Filled 2021-02-26: qty 5

## 2021-02-26 NOTE — Progress Notes (Signed)
[place]   [phone] Fax:([phone]   Clinic Follow up Note   Patient Care Team: [name], MD as PCP - General (Infectious Diseases) [name], MD as PCP - Infectious Diseases (Infectious Diseases) [name], MD as Consulting Physician (General Surgery) [name], MD as Consulting Physician (Hematology) [name], MD as Consulting Physician (Radiation Oncology) [name], RN as Oncology Nurse Navigator [name], RN as Oncology Nurse Navigator  Date of Service:  02/26/2021  CHIEF COMPLAINT: F/u ofbilateralbreast cancer  SUMMARY OF ONCOLOGIC HISTORY: Oncology History Overview Note  Cancer Staging Bilateral breast cancer [facility]) Staging form: Breast, AJCC 8th Edition - Pathologic stage from 10/01/2020: Stage IIIC (pT3, pN2a, cM0, G3, ER-, PR-, HER2-) - Signed by Alla Feeling, NP on 10/24/2020  Malignant neoplasm of upper-outer quadrant of left breast in female, estrogen receptor positive ([place]) Staging form: Breast, AJCC 8th Edition - Clinical stage from 03/01/2020: Stage IIIC (cT4, cN0, cM0, G3, ER+, PR-, HER2-) - Signed by [name], MD on 08/22/2020 - Pathologic stage from 10/01/2020: Stage IIIC (pT4b, pN0, cM0, G3, ER-, PR-, HER2-) - Signed by Alla Feeling, NP on 10/24/2020    Malignant neoplasm of upper-outer quadrant of left breast in female, estrogen receptor negative ([place])  02/28/2020 Mammogram   Diagnostic Mammogram 02/28/20  IMPRESSION The 3.9 cm ill defined mass in the left breast posterior depth central 4.3cm to the nipple seen on the mediolateral oblique view onlt with associated difssue skin thickening is highly suspicious for malignancy.    03/01/2020 Cancer Staging   Staging form: Breast, AJCC 8th Edition - Clinical stage from 03/01/2020: Stage IIIC (cT4, cN0, cM0, G3, ER+, PR-, HER2-) - Signed by [name], MD on 08/22/2020   03/01/2020 Initial Biopsy   Diagnosis 03/01/20  1. Breast, left,  needle core biopsy, 9 o'clock, 3-4cmfn - INVASIVE DUCTAL CARCINOMA. SEE NOTE 2. Lymph node, needle/core biopsy, right axilla - INVASIVE DUCTAL CARCINOMA. SEE NOTE Diagnosis Note 1. Invasive carcinoma measures 1.5 cm in greatest linear dimension and appears grade 3. Dr. [name] reviewed the case and concurs with the diagnosis. A breast prognostic profile (ER, PR, Ki-67 and HER2) is pending and will be reported in an addendum. Dr. [name] was notified on 03/04/2020. 2. Carcinoma measures 0.6 cm in greatest linear dimension and appears grade 3. Lymphoid tissue is not identified - this may represent a completely replaced lymph node. A breast prognostic profile (ER, PR, Ki-67 and HER2) is pending and will be reported in an addendum. Dr. [name] reviewed the case and concurs with the diagnosis. Dr. [name] was notified on 03/04/2020.   03/01/2020 Receptors her2   1. PROGNOSTIC INDICATORS Results: IMMUNOHISTOCHEMICAL AND MORPHOMETRIC ANALYSIS PERFORMED MANUALLY The tumor cells are NEGATIVE for Her2 (0). Estrogen Receptor: 20%, POSITIVE, WEAK STAINING INTENSITY Progesterone Receptor: 0%, NEGATIVE Proliferation Marker Ki67: 40% COMMENT: The negative hormone receptor study(ies) in this case has an internal positive control.    2. PROGNOSTIC INDICATORS Results: IMMUNOHISTOCHEMICAL AND MORPHOMETRIC ANALYSIS PERFORMED MANUALLY The tumor cells are NEGATIVE for Her2 (1+). Estrogen Receptor: 10%, POSITIVE, WEAK STAINING INTENSITY Progesterone Receptor: 0%, NEGATIVE Proliferation Marker Ki67: 40% COMMENT: The negative hormone receptor study(ies) in this case has no internal positive control.   03/05/2020 Initial Diagnosis   Malignant neoplasm of upper-outer quadrant of left breast in female, estrogen receptor positive ([place])   03/18/2020 Breast MRI   IMPRESSION: 1. 7 x 9 x 6 cm area of suspicious non masslike enhancement within the central/UPPER RIGHT breast  with anterior skin thickening.  Given biopsy-proven metastatic RIGHT axillary lymph node, tissue sampling of the anterior and posterior aspects of this non masslike RIGHT breast enhancement is recommended to exclude malignancy. 2. 7 x 6 x 5 cm diffuse masslike and nonmasslike enhancement within the majority of the remaining LEFT breast with diffuse LEFT breast skin thickening, compatible with malignancy. Abnormal enhancement is noted along the anterior aspect of the LEFT pectoralis muscle. 3. Three abnormal RIGHT axillary lymph nodes, 1 which is biopsy proven to be metastatic disease. No abnormal LEFT axillary lymph nodes identified.   03/20/2020 Echocardiogram   Baseline ECHO  IMPRESSIONS     1. The patient was reported to be in pain during the study and not able  to participate in this study. All that can be said is that the LV and RV  function are grossly normal. Would recommend to repeat this study when/if  the patient is able to tolerate  the exam.   2. Left ventricular ejection fraction, by estimation, is 60 to 65%. The  left ventricle has normal function. Left ventricular endocardial border  not optimally defined to evaluate regional wall motion. Left ventricular  diastolic function could not be  evaluated.   3. Right ventricular systolic function is normal. The right ventricular  size is normal.   4. The mitral valve was not well visualized. No evidence of mitral valve  regurgitation.   5. The aortic valve was not well visualized. Aortic valve regurgitation  is not visualized.   6. The inferior vena cava is normal in size with greater than 50%  respiratory variability, suggesting right atrial pressure of 3 mmHg.    03/20/2020 Imaging   CT CAP W contrast  IMPRESSION: 1. Small right axillary/subpectoral lymph nodes. Difficult to exclude metastatic disease. 2. Subtle 6 mm low-attenuation lesion in the dome of the liver, not well seen previously. Lesion is too small to characterize. Further evaluation  could be performed with MR abdomen without and with contrast, as clinically indicated. 3. Hepatic steatosis. 4. Aortic atherosclerosis (ICD10-I70.0). Coronary artery calcification.     03/20/2020 Imaging   Whole body bone scan  IMPRESSION: 1. Increased activity noted over the right breast, possibly related to the patient's right breast cancer.   2. Punctate area of increased activity over the left maxillary region, most likely related to sinus disease or dental disease. Increased activity noted over the left mandible also possibly related to dental disease. No other focal bony abnormalities to suggest metastatic disease identified.   03/22/2020 Procedure   PAC placement by Dr [name]    03/22/2020 - 08/23/2020 Chemotherapy   AC q2weeks for 4 cycles 03/22/20-05/03/20 followed by weekly Taxol and Carboplatin q3weeks for 12 weeks starting 05/17/20.                  Week 2 Taxol reduced to half dose and CT was dose reduced starting with week 4 due to thrombocytopenia. Given pancytopenia, will change Carboplatin to 50% dose weekly with Taxol 22m/m2 starting with week 7 (06/13/20). Will hold Carboplatin as needed based on labs. Carboplatin increased to 80% starting with week 8. Carboplatin dose reduced for C11 and held with C12. C12 Taxol postponed to 08/23/20   03/22/2020 Pathology Results   FINAL MICROSCOPIC DIAGNOSIS:   A. BREAST, RIGHT, PUNCH BIOPSY:  - Carcinoma involving dermal lymphatics.  - See comment.   B. BREAST, LEFT, PUNCH BIOPSY:  - Carcinoma involving dermal lymphatics.  - See comment.   COMMENT:  The  morphology is compatible with ductal breast carcinoma.    ADDENDUM:   PROGNOSTIC INDICATOR RESULTS:   Immunohistochemical and morphometric analysis performed manually   The tumor cells are NEGATIVE for Her2 (0%).   Estrogen Receptor:       NEGATIVE, 0%  Progesterone Receptor:   NEGATIVE, 0%    08/13/2020 Breast MRI   IMPRESSION: 1. Interval significant response to  treatment of the diffuse non-mass enhancement involving the UPPER RIGHT breast. There is a solitary residual 5 mm indeterminate mass in the UPPER OUTER QUADRANT at MIDDLE depth with plateau kinetics. 2. Minimal response to treatment of the diffuse non-mass enhancement throughout the LEFT breast. 3. Resolution of enhancement of the skin and nipple-areolar complex of the RIGHT breast. 4. Near complete resolution of enhancement of the skin and nipple-areolar complex of the LEFT breast. Minimal residual nipple-areola complex enhancement persists. 5. The 3 previously identified pathologic RIGHT axillary lymph nodes are now normal in appearance. There is no pathologic lymphadenopathy currently.   08/23/2020 - 01/13/2021 Chemotherapy   Immunotherapy Keytruda q3weeks starting 08/23/20 for 1 year treatment. D/c on 01/13/21 given disease recurrence/progression   10/01/2020 Surgery   MASTECTOMIES WITH RIGHT SENTINEL LYMPH NODE BIOPSIES,RIGHT TARGETED RADIOACTIVE SEED LYMPH NODE, LEFT BREAST MODIFIED RADICAL MASTECTOMY  and RADIOACTIVE SEED GUIDED  TARGETED RIGHT SENTINEL LYMPH NODE BIOPSY by Dr [name]     10/01/2020 Pathology Results   FINAL MICROSCOPIC DIAGNOSIS:   A. BREAST, LEFT, MASTECTOMY:  - Invasive ductal carcinoma, 7 cm.  - Anterior and posterior soft tissue margins not involved by tumor.  - Foci of skin involvement by tumor, see comment.  - Nipple involvement by tumor.  - See oncology table and comment.   B. LYMPH NODE, LEFT AXILLARY, CONTENTS:  - One lymph node with no metastatic carcinoma (0/1).   C. BREAST, RIGHT, MASTECTOMY:  - Invasive ductal carcinoma, greater than 5 cm.  - Margins not involved.  - Subareolar breast tissue involved by tumor.  - See oncology table and comment.   D. LYMPH NODE, RIGHT #2, SENTINEL, EXCISION:  - Metastatic carcinoma in one lymph node, 0.5 cm (1/1).   E. LYMPH NODE, RIGHT #1, SENTINEL, EXCISION:  - Metastatic carcinoma in one lymph node,  1.5 cm (1/1).  - Biopsy site and biopsy clip.   F. LYMPH NODE, RIGHT #3, SENTINEL, EXCISION:  - Metastatic carcinoma in one lymph node, 1.5 cm (1/1).   G. LYMPH NODE, RIGHT #4, SENTINEL, EXCISION:  - One lymph node with no metastatic carcinoma (0/1).   H. LYMPH NODE, RIGHT #5, SENTINEL, EXCISION:  - Metastatic carcinoma in one lymph node, 0.5 cm (1/1).   I. LYMPH NODE, RIGHT #6, SENTINEL, EXCISION:  - One lymph node with no metastatic carcinoma (0/1).     A.  LEFT BREAST PROGNOSTIC INDICATOR RESULTS:   Immunohistochemical and morphometric analysis performed manually   The tumor cells are NEGATIVE for Her2 (0).   Estrogen Receptor:       0%, Negative  Progesterone Receptor:   0%, Negative  Proliferation Marker Ki-67:   80%   Comment: The negative hormone receptor study(ies) in the case has an  internal positive control.   Reference Range Estrogen and Progesterone Receptor       Negative  0%       Positive  >1%   All controls stained appropriately.   C. RIGHT BREAST PROGNOSTIC INDICATOR RESULTS:   Immunohistochemical and morphometric analysis performed manually   The tumor cells are NEGATIVE for Her2 (0).  Estrogen Receptor:       0%, Negative  Progesterone Receptor:   0%, Negative  Proliferation Marker Ki-67:   90%   10/01/2020 Cancer Staging   Staging form: Breast, AJCC 8th Edition - Pathologic stage from 10/01/2020: Stage IIIC (pT4b, pN0, cM0, G3, ER-, PR-, HER2-) - Signed by Alla Feeling, NP on 10/24/2020   10/14/2020 Surgery   RIGHT AXILLARY LYMPH NODE DISSECTION by Dr [name]  -1 /13 positive LN for metastatic carcinoma    11/18/2020 - 01/20/2021 Chemotherapy   Adjuvant Xeloda 1500 mg BID days 1-14 q21 days x6 months, starting 11/18/2020 (will reduce to 1500 mg Am/1000 mg Pm M-F during radiation). Stopped 01/20/21 due to liver mets/disease recurrence   12/10/2020 - 01/24/2021 Radiation Therapy   Adjuvant Radiation to right chest wall and axilla with Dr Lisbeth Renshaw  on 12/10/20-01/24/21   12/16/2020 Tumor Marker   CA 27.29 at 582.2 on 11/14/20   Ca 27.29 at 1387.9 on 12/16/20   01/13/2021 Progression   PET IMPRESSION: 1. Interval development of bulky hypermetabolic liver metastases involving both hepatic lobes. 2. Hypermetabolic lesion in the posterior left hemithorax. This is obscured by small pleural effusion on CT imaging and could be a pulmonary nodule or pleural nodule. Given superimposition of the pleural fluid, pleural nodule is favored. 3. Tiny subtle soft tissue nodule anterior paramidline left hemithorax with low level FDG uptake raises concern for a second site of pleural disease in the left chest. 4.  Aortic Atherosclerois (ICD10-170.0)   01/15/2021 Pathology Results   FINAL MICROSCOPIC DIAGNOSIS:   A. LIVER, RIGHT LOBE, NEEDLE CORE BIOPSY:  - Metastatic carcinoma to liver, consistent with patient's clinical  history of primary breast carcinoma.  See comment    PROGNOSTIC INDICATOR RESULTS:   Immunohistochemical and morphometric analysis performed manually   The tumor cells are NEGATIVE for Her2 (1+).   Estrogen Receptor:       POSITIVE, 20%, WEAK STAINING  Progesterone Receptor:   NEGATIVE  Proliferation Marker Ki-67:   30%    01/27/2021 -  Chemotherapy   First-line IV Trodelvy 2 weeks on/1 week off starting 01/27/21. After first dose she developed moderate pancytopenia. Her thrombocytopenia did not improve off treatment. With second dose on 02/12/21, will reduce Trodelvy dose by 50% and change to every 2 weeks.     Genetic Testing   Negative genetic testing. No pathogenic variants identified on the [facility] CustomNext+RNA panel. The report date is 01/29/2021.   The CustomNext-Cancer + RNAinsight panel  includes sequencing and/or deletion duplication testing of the following 47 genes: APC, ATM, AXIN2, BARD1, BMPR1A, BRCA1, BRCA2, BRIP1, CDH1, CDK4, CDKN2A, CHEK2, DICER1, MEN1, MLH1, MSH2, MSH3, MSH6, MUTYH, NBN, NF1, NF2, NTHL1, PALB2,  PMS2, PTEN, RAD51C, RAD51D, RECQL, RET, SDHA, SDHAF2, SDHB, SDHC, SDHD, SMAD4, SMARCA4, STK11, TP53, TSC1, TSC2 and VHL (sequencing and deletion/duplication); HOXB13, POLD1 and POLE (sequencing only); EPCAM and GREM1 (deletion/duplication only). RNA data is routinely analyzed for use in variant interpretation for all genes.      CURRENT THERAPY:  First-line IV Trodelvy 2 weeks on/1 week off starting 01/27/21. After first dose she developed moderate pancytopenia. Her thrombocytopenia did not improve off treatment. Starting with second dose on 02/12/21, Trodelvy dose by 50% and change to every 2 weeks.  INTERVAL HISTORY:  ARYELLE FIGG is here for a follow up. She notes she feels fatigued but still function. She feels adequately overall. She notes having dark urine for the past 3 days. She notes she has not been drinking  as much water. She notes she has been drinking about 3 bottles of a water a day. She also has been drinking Pedialyte a day, over 40 ounces. She notes she weighed 120 pounds today. She notes her weight is overall stable. She notes she is eating enough in small meals. She notes she last ate yesterday afternoon and only drank water this morning.    REVIEW OF SYSTEMS:   Constitutional: Denies fevers, chills or abnormal weight loss Eyes: Denies blurriness of vision Ears, nose, mouth, throat, and face: Denies mucositis or sore throat Respiratory: Denies cough, dyspnea or wheezes Cardiovascular: Denies palpitation, chest discomfort or lower extremity swelling Gastrointestinal:  Denies nausea, heartburn or change in bowel habits Skin: Denies abnormal skin rashes Lymphatics: Denies new lymphadenopathy or easy bruising Neurological:Denies numbness, tingling or new weaknesses Behavioral/Psych: Mood is stable, no new changes  All other systems were reviewed with the patient and are negative.  MEDICAL HISTORY:  Past Medical History:  Diagnosis Date  . Aortic atherosclerosis ([place])   .  Breast cancer ([place]) dx'd 1993   left  . Fatty liver   . History of left breast cancer   . HIV infection ([place])   . Hypertension   . Port-A-Cath in place     SURGICAL HISTORY: Past Surgical History:  Procedure Laterality Date  . AXILLARY LYMPH NODE DISSECTION Right 10/14/2020   Procedure: left axilla removal of two drains right axillary lymph node dissection;  Surgeon: [name], MD;  Location: [facility];  Service: General;  Laterality: Right;  PEC BLOCK, RNFA OR PA  . BREAST BIOPSY Bilateral 03/22/2020   Procedure: BILATERAL BREAST PUNCH BIOPSIES;  Surgeon: [name], MD;  Location: [place];  Service: General;  Laterality: Bilateral;  . BREAST LUMPECTOMY    . CESAREAN SECTION    . left breast lumpectomy  1993  . MASTECTOMY W/ SENTINEL NODE BIOPSY Bilateral 10/01/2020  . MASTECTOMY W/ SENTINEL NODE BIOPSY Bilateral 10/01/2020   Procedure: BILATERAL MASTECTOMIES WITH RIGHT SENTINEL LYMPH NODE BIOPSIES,RIGHT TARGETED RADIOACTIVE SEED LYMPH NODE, LEFT BREAST MODIFIED RADICAL MASTECTOMY ;  Surgeon: [name], MD;  Location: [place];  Service: General;  Laterality: Bilateral;  PEC BLOCK, RNFA  . PORTACATH PLACEMENT Right 03/22/2020   Procedure: INSERTION PORT-A-CATH WITH ULTRASOUND GUIDANCE;  Surgeon: [name], MD;  Location: [place];  Service: General;  Laterality: Right;  . RADIOACTIVE SEED GUIDED AXILLARY SENTINEL LYMPH NODE Right 10/01/2020   Procedure: RADIOACTIVE SEED GUIDED  TARGETED RIGHT SENTINEL LYMPH NODE BIOPSY;  Surgeon: [name], MD;  Location: [place];  Service: General;  Laterality: Right;  PEC BLOCK, RNFA    I have reviewed the social history and family history with the patient and they are unchanged from previous note.  ALLERGIES:  is allergic to codeine, other, grapefruit bioflavonoid complex, pomegranate [punica], and shellfish allergy.  MEDICATIONS:  Current Outpatient Medications  Medication Sig Dispense Refill  . acetaminophen (TYLENOL) 500 MG tablet Take  1,000 mg by mouth every 6 (six) hours as needed for moderate pain or headache.     . eltrombopag (PROMACTA) 50 MG tablet Take 1 tablet (50 mg total) by mouth daily. Take on an empty stomach 1 hour before a meal or 2 hours after 30 tablet 2  . emtricitabine-rilpivir-tenofovir AF (ODEFSEY) 200-25-25 MG TABS tablet Take 1 tablet by mouth daily with breakfast. 30 tablet 11  . hydrochlorothiazide (HYDRODIURIL) 25 MG tablet TAKE 1 TABLET(25 MG) BY MOUTH DAILY (Patient taking differently: Take 25 mg by mouth daily.) 30  tablet 5  . lidocaine-prilocaine (EMLA) cream Apply 1 application topically See admin instructions. Apply 1-2 hours prior to access    . loratadine (CLARITIN) 10 MG tablet Take 10 mg by mouth See admin instructions. Takes 3-4 days before and after treatment    . ondansetron (ZOFRAN) 8 MG tablet Take 1 tablet (8 mg total) by mouth every 8 (eight) hours as needed for nausea or vomiting. 20 tablet 0  . potassium chloride SA (KLOR-CON) 20 MEQ tablet Take 1 tablet (20 mEq total) by mouth daily. 30 tablet 0  . prochlorperazine (COMPAZINE) 10 MG tablet Take 1 tablet (10 mg total) by mouth every 6 (six) hours as needed (Nausea or vomiting). 30 tablet 2  . traMADol (ULTRAM) 50 MG tablet Take 1 tablet (50 mg total) by mouth every 6 (six) hours as needed (mild pain). (Patient not taking: Reported on 01/14/2021) 30 tablet 1   No current facility-administered medications for this visit.    PHYSICAL EXAMINATION: ECOG PERFORMANCE STATUS: 2 - Symptomatic, <50% confined to bed  There were no vitals filed for this visit. There were no vitals filed for this visit.  GENERAL:alert, no distress and comfortable SKIN: skin color, texture, turgor are normal, no rashes or significant lesions EYES: normal, Conjunctiva are pink and non-injected (+) Jaundice  NECK: supple, thyroid normal size, non-tender, without nodularity LYMPH:  no palpable lymphadenopathy in the cervical, axillary  LUNGS: clear to  auscultation and percussion with normal breathing effort HEART: regular rate & rhythm and no murmurs and no lower extremity edema ABDOMEN:abdomen soft, non-tender and normal bowel sounds (+) Mild hepatomegaly  Musculoskeletal:no cyanosis of digits and no clubbing  NEURO: alert & oriented x 3 with fluent speech, no focal motor/sensory deficits  LABORATORY DATA:  I have reviewed the data as listed CBC Latest Ref Rng & Units 02/26/2021 02/17/2021 02/12/2021  WBC 4.0 - 10.5 K/uL 9.7 7.9 2.7(L)  Hemoglobin 12.0 - 15.0 g/dL 9.6(L) 10.2(L) 10.7(L)  Hematocrit 36.0 - 46.0 % 29.2(L) 28.2(L) 30.9(L)  Platelets 150 - 400 K/uL 73(L) 38(L) 69(L)     CMP Latest Ref Rng & Units 02/26/2021 02/17/2021 02/12/2021  Glucose 70 - 99 mg/dL 107(H) 121(H) 110(H)  BUN 8 - 23 mg/dL 14 18 10   Creatinine 0.44 - 1.00 mg/dL 0.82 0.82 0.85  Sodium 135 - 145 mmol/L 131(L) 134(L) 134(L)  Potassium 3.5 - 5.1 mmol/L 4.1 4.3 3.5  Chloride 98 - 111 mmol/L 101 101 103  CO2 22 - 32 mmol/L 18(L) 23 18(L)  Calcium 8.9 - 10.3 mg/dL 8.5(L) 8.5(L) 8.5(L)  Total Protein 6.5 - 8.1 g/dL 6.0(L) 6.0(L) 6.4(L)  Total Bilirubin 0.3 - 1.2 mg/dL 4.8(HH) 1.9(H) 1.3(H)  Alkaline Phos 38 - 126 U/L 2,308(H) 1,773(H) 1,682(H)  AST 15 - 41 U/L 295(HH) 207(HH) 246(HH)  ALT 0 - 44 U/L 90(H) 126(H) 131(H)      RADIOGRAPHIC STUDIES: I have personally reviewed the radiological images as listed and agreed with the findings in the report. No results found.   ASSESSMENT & PLAN:  ARNELL MAUSOLF is a 65 y.o. female with    1.Transaminitis, secondary toliver metastasis -Since she started chemotherapy Ivette Loyal), LFTs slightly improved.  -However, today's labs show AST 295, ALT 90, Alk Phos 2308, Tbili 4.8. She has had dark urine for the past 3 days. She also has hepatomegaly and mild jaundice of eyes on exam today (02/26/21).  -I recommend US Liver today. Will obtain CT CAP in the next week for further evaluation of her cancer.  -  I discussed  the cause of her worsening liver function could be obstruction of her CBD, her cancer or her treatment. Change in treatment and procedure can improve her condition, but if this is related to her cancer, her treatment options significantly decrease.   2.InflammatoryLeft breast cancer,pT4N0Mx,ypT4N0,triple negativeAND Right breast cancer withaxillary node metastasis,pT3N2aM0,ERweakly+,PR/HER2-,ypT3N2a, triple negative,GradeIII, diffuse liver metastasis(ER weakly+/PR-/HER-)in 12/2020 -She was diagnosed with left breast cancer in 02/2020.She presented with inflammatory left breast cancer. Biopsy of left breast mass and right axillary LN were positive for Invasive ductal carcinoma.Further work up showed 9cm nodular area in right breast and skin biopsy confirmed cancer involvement.Staging scansin May 2021were negative for distant metastasis. -She completed AC X4 and weekly TC 03/22/20-08/23/20. She underwent b/l mastectomy with Dr Donell Beers on 10/01/20.Shealsounderwent right axillary lymph node dissection on 10/14/2020 and completed Adjuvant radiation with Dr Moody2/8/22-3/25/22. -Given recentpublish [employer] 522 trial data,I started her on Immunotherapy Keytruda q3weeks for 1 year of treatment beginning 08/23/20.Due tohersignificantresidual diseasein LNs,Istarted her onadjuvant Xelodafor 6 months on 11/18/20. Unfortunately she progressed/recurred with diffuse liver mets on 01/13/21 PET and 01/15/21 Liver biopsy (Her/PR negative/ER 20% weakly positive).Keytruda/Xeloda d/c.  -I have requested [facility] one on her biopsy sample, results still pending.  -Istarted her on first-line IV Trodelvy 2 weeks on/1 week offbeginning 01/27/21. After first dose she developed moderate pancytopenia. Her thrombocytopenia did not improve off treatment so I reduced Trodelvy dose by 50% and change to every 2 weeks starting 02/12/21 (second dose).  -S/p C2, She notes more fatigue and dark urine, otherwise  she is stable. Labs show Hg 9.6, plt 73K, Sodium 131, BG 107, CA 8.5, protein 6, albumin 2.1, AST 295, ALT 90, Alk Phos 2308, tbili 4.8. She does have hepatomegaly and jaundice on exam.  -I discussed given transmanitis and hyperbilirubinemia, I will not give treatment today. Will do further work up and F/u next week. She is agreeable.  -If her hyperbilirubinemia is related to cancer progression in liver, I may try [place] and gemcitabine, or discuss hospice care   3.Cytopenias, Moderate Thrombocytopenia -secondary to chemo Drinda Butts) -Mild and stableanemia and intermittent leukopenia.Continue monitoring labs weekly -After C1D1 Trodelvy her blood counts dropped. After being off Trodelvy for 2 weeks, labs improved, but her plt remain low. Dose reduced to every 2 weeks form 02/12/21. Will only give GCSF as needed. -Her Plt remain in low ranges. I started her on Promacta 50mg  daily (02/12/21). If not enough, will given NPlate.   4. H/o Left breast cancer, Dx in 1993s/p lumpectomy, radiation, chemo, Tamoxifen.Genetic Testing from 01/14/21 was negative for pathogenetic mutations  5. Comorbidities: HIV(+)Dx 1993, HTN -Her HIV has been undetectable and well controlled. She is currently being seen by ID Dr 01/16/21 -Ipreviouslyreferredher for Evusheld injection for COVID prevention   PLAN: -Due to hyperbilirubinemia, will hold chemo treatment today  -[facility] Liver today at [facility]  -If no bili obstruction, I will order CT CAP W contrast next week  -Lab, F/u early next week. I encouraged her to bring her husband with her for office visit.    No problem-specific Assessment & Plan notes found for this encounter.   Orders Placed This Encounter  Procedures  . [facility] Abdomen Complete    Standing Status:   Future    Standing Expiration Date:   02/26/2022    Order Specific Question:   Reason for Exam (SYMPTOM  OR DIAGNOSIS REQUIRED)    Answer:   jaundice    Order Specific Question:   Preferred imaging  location?  Answer:   [facility]   All questions were answered. The patient knows to call the clinic with any problems, questions or concerns. No barriers to learning was detected. The total time spent in the appointment was 30 minutes.     [name], MD 02/26/2021   I, Joslyn Devon, am acting as scribe for [name], MD.   I have reviewed the above documentation for accuracy and completeness, and I agree with the above.

## 2021-02-26 NOTE — Progress Notes (Signed)
Treatment held today due to abnormal labs. Per Dr Burr Medico, pt does not need Nplate today.

## 2021-02-26 NOTE — Patient Instructions (Signed)
Implanted Port Insertion, Care After This sheet gives you information about how to care for yourself after your procedure. Your health care provider may also give you more specific instructions. If you have problems or questions, contact your health care provider. What can I expect after the procedure? After the procedure, it is common to have:  Discomfort at the port insertion site.  Bruising on the skin over the port. This should improve over 3-4 days. Follow these instructions at home: Port care  After your port is placed, you will get a manufacturer's information card. The card has information about your port. Keep this card with you at all times.  Take care of the port as told by your health care provider. Ask your health care provider if you or a family member can get training for taking care of the port at home. A home health care nurse may also take care of the port.  Make sure to remember what type of port you have. Incision care  Follow instructions from your health care provider about how to take care of your port insertion site. Make sure you: ? Wash your hands with soap and water before and after you change your bandage (dressing). If soap and water are not available, use hand sanitizer. ? Change your dressing as told by your health care provider. ? Leave stitches (sutures), skin glue, or adhesive strips in place. These skin closures may need to stay in place for 2 weeks or longer. If adhesive strip edges start to loosen and curl up, you may trim the loose edges. Do not remove adhesive strips completely unless your health care provider tells you to do that.  Check your port insertion site every day for signs of infection. Check for: ? Redness, swelling, or pain. ? Fluid or blood. ? Warmth. ? Pus or a bad smell.      Activity  Return to your normal activities as told by your health care provider. Ask your health care provider what activities are safe for you.  Do not  lift anything that is heavier than 10 lb (4.5 kg), or the limit that you are told, until your health care provider says that it is safe. General instructions  Take over-the-counter and prescription medicines only as told by your health care provider.  Do not take baths, swim, or use a hot tub until your health care provider approves. Ask your health care provider if you may take showers. You may only be allowed to take sponge baths.  Do not drive for 24 hours if you were given a sedative during your procedure.  Wear a medical alert bracelet in case of an emergency. This will tell any health care providers that you have a port.  Keep all follow-up visits as told by your health care provider. This is important. Contact a health care provider if:  You cannot flush your port with saline as directed, or you cannot draw blood from the port.  You have a fever or chills.  You have redness, swelling, or pain around your port insertion site.  You have fluid or blood coming from your port insertion site.  Your port insertion site feels warm to the touch.  You have pus or a bad smell coming from the port insertion site. Get help right away if:  You have chest pain or shortness of breath.  You have bleeding from your port that you cannot control. Summary  Take care of the port as told by your   health care provider. Keep the manufacturer's information card with you at all times.  Change your dressing as told by your health care provider.  Contact a health care provider if you have a fever or chills or if you have redness, swelling, or pain around your port insertion site.  Keep all follow-up visits as told by your health care provider. This information is not intended to replace advice given to you by your health care provider. Make sure you discuss any questions you have with your health care provider. Document Revised: 05/17/2018 Document Reviewed: 05/17/2018 Elsevier Patient Education   2021 Elsevier Inc.  

## 2021-02-26 NOTE — Telephone Encounter (Signed)
Critical Values: T. Bili 4.8 AST 295 Dr Burr Medico notified

## 2021-03-02 ENCOUNTER — Other Ambulatory Visit: Payer: Self-pay | Admitting: Hematology

## 2021-03-02 NOTE — Progress Notes (Signed)
DISCONTINUE ON PATHWAY REGIMEN - Breast     A cycle is every 14 days (cycles 1-4):     Doxorubicin      Cyclophosphamide      Pegfilgrastim-xxxx    A cycle is every 21 days (cycles 5-8):     Paclitaxel      Carboplatin   **Always confirm dose/schedule in your pharmacy ordering system**  REASON: Disease Progression PRIOR TREATMENT: [id number]: Dose-Dense AC [Doxorubicin + Cyclophosphamide q14 Days x 4 Cycles], Followed by Paclitaxel 80 mg/m2 Weekly + Carboplatin AUC=6 q21 Days x 12 Weeks TREATMENT RESPONSE: Progressive Disease (PD)  START OFF PATHWAY REGIMEN - Breast   [id number]:Carboplatin AUC=2 IV D1,8 + Gemcitabine 1,000 mg/m2 IV D1,8 q21 Days:   A cycle is every 21 days:     Carboplatin      Gemcitabine   **Always confirm dose/schedule in your pharmacy ordering system**  Patient Characteristics: Distant Metastases or Locoregional Recurrent Disease - Unresected or Locally Advanced Unresectable Disease Progressing after Neoadjuvant and Local Therapies, HER2 Negative/Unknown/Equivocal, ER Negative/Unknown, Chemotherapy, Third Line and Beyond, Prior  or Contraindicated Anthracycline and Prior or Contraindicated Eribulin Therapeutic Status: Distant Metastases ER Status: Negative (-) HER2 Status: Negative (-) PR Status: Negative (-) Therapy Approach Indicated: Standard Chemotherapy/Endocrine Therapy Line of Therapy: Third Line and Beyond Intent of Therapy: Non-Curative / Palliative Intent, Discussed with Patient

## 2021-03-03 ENCOUNTER — Telehealth: Payer: Self-pay | Admitting: Hematology

## 2021-03-03 ENCOUNTER — Inpatient Hospital Stay: Payer: BC Managed Care – PPO | Attending: Hematology

## 2021-03-03 ENCOUNTER — Inpatient Hospital Stay (HOSPITAL_BASED_OUTPATIENT_CLINIC_OR_DEPARTMENT_OTHER): Payer: BC Managed Care – PPO | Admitting: Hematology

## 2021-03-03 ENCOUNTER — Encounter: Payer: Self-pay | Admitting: Hematology

## 2021-03-03 ENCOUNTER — Other Ambulatory Visit: Payer: Self-pay

## 2021-03-03 ENCOUNTER — Telehealth: Payer: Self-pay | Admitting: *Deleted

## 2021-03-03 VITALS — BP 142/100 | HR 114 | Temp 97.5°F | Wt 119.0 lb

## 2021-03-03 DIAGNOSIS — Z923 Personal history of irradiation: Secondary | ICD-10-CM | POA: Diagnosis not present

## 2021-03-03 DIAGNOSIS — Z5111 Encounter for antineoplastic chemotherapy: Secondary | ICD-10-CM | POA: Diagnosis present

## 2021-03-03 DIAGNOSIS — D6481 Anemia due to antineoplastic chemotherapy: Secondary | ICD-10-CM | POA: Diagnosis not present

## 2021-03-03 DIAGNOSIS — C50412 Malignant neoplasm of upper-outer quadrant of left female breast: Secondary | ICD-10-CM | POA: Insufficient documentation

## 2021-03-03 DIAGNOSIS — B20 Human immunodeficiency virus [HIV] disease: Secondary | ICD-10-CM

## 2021-03-03 DIAGNOSIS — Z170 Estrogen receptor positive status [ER+]: Secondary | ICD-10-CM

## 2021-03-03 DIAGNOSIS — R7401 Elevation of levels of liver transaminase levels: Secondary | ICD-10-CM | POA: Insufficient documentation

## 2021-03-03 DIAGNOSIS — Z171 Estrogen receptor negative status [ER-]: Secondary | ICD-10-CM | POA: Diagnosis not present

## 2021-03-03 DIAGNOSIS — Z21 Asymptomatic human immunodeficiency virus [HIV] infection status: Secondary | ICD-10-CM | POA: Diagnosis not present

## 2021-03-03 DIAGNOSIS — D649 Anemia, unspecified: Secondary | ICD-10-CM

## 2021-03-03 DIAGNOSIS — Z95828 Presence of other vascular implants and grafts: Secondary | ICD-10-CM

## 2021-03-03 DIAGNOSIS — I10 Essential (primary) hypertension: Secondary | ICD-10-CM | POA: Diagnosis not present

## 2021-03-03 DIAGNOSIS — C787 Secondary malignant neoplasm of liver and intrahepatic bile duct: Secondary | ICD-10-CM | POA: Diagnosis not present

## 2021-03-03 DIAGNOSIS — Z9013 Acquired absence of bilateral breasts and nipples: Secondary | ICD-10-CM | POA: Insufficient documentation

## 2021-03-03 LAB — CMP (CANCER CENTER ONLY)
ALT: 71 U/L — ABNORMAL HIGH (ref 0–44)
AST: 353 U/L (ref 15–41)
Albumin: 1.8 g/dL — ABNORMAL LOW (ref 3.5–5.0)
Alkaline Phosphatase: 2512 U/L — ABNORMAL HIGH (ref 38–126)
Anion gap: 11 (ref 5–15)
BUN: 19 mg/dL (ref 8–23)
CO2: 22 mmol/L (ref 22–32)
Calcium: 8.5 mg/dL — ABNORMAL LOW (ref 8.9–10.3)
Chloride: 99 mmol/L (ref 98–111)
Creatinine: 0.85 mg/dL (ref 0.44–1.00)
GFR, Estimated: >60 mL/min (ref >60)
Glucose, Bld: 149 mg/dL — ABNORMAL HIGH (ref 70–99)
Potassium: 3.8 mmol/L (ref 3.5–5.1)
Sodium: 132 mmol/L — ABNORMAL LOW (ref 135–145)
Total Bilirubin: 5.9 mg/dL (ref 0.3–1.2)
Total Protein: 6 g/dL — ABNORMAL LOW (ref 6.5–8.1)

## 2021-03-03 LAB — CBC WITH DIFFERENTIAL (CANCER CENTER ONLY)
Abs Immature Granulocytes: 0.08 10*3/uL — ABNORMAL HIGH (ref 0.00–0.07)
Basophils Absolute: 0 10*3/uL (ref 0.0–0.1)
Basophils Relative: 0 %
Eosinophils Absolute: 0 10*3/uL (ref 0.0–0.5)
Eosinophils Relative: 0 %
HCT: 29.5 % — ABNORMAL LOW (ref 36.0–46.0)
Hemoglobin: 9.9 g/dL — ABNORMAL LOW (ref 12.0–15.0)
Immature Granulocytes: 1 %
Lymphocytes Relative: 10 %
Lymphs Abs: 1 10*3/uL (ref 0.7–4.0)
MCH: 35 pg — ABNORMAL HIGH (ref 26.0–34.0)
MCHC: 33.6 g/dL (ref 30.0–36.0)
MCV: 104.2 fL — ABNORMAL HIGH (ref 80.0–100.0)
Monocytes Absolute: 0.7 10*3/uL (ref 0.1–1.0)
Monocytes Relative: 8 %
Neutro Abs: 7.4 10*3/uL (ref 1.7–7.7)
Neutrophils Relative %: 81 %
Platelet Count: 73 10*3/uL — ABNORMAL LOW (ref 150–400)
RBC: 2.83 MIL/uL — ABNORMAL LOW (ref 3.87–5.11)
RDW: 19.2 % — ABNORMAL HIGH (ref 11.5–15.5)
WBC Count: 9.2 10*3/uL (ref 4.0–10.5)
nRBC: 0.9 % — ABNORMAL HIGH (ref 0.0–0.2)

## 2021-03-03 MED ORDER — HEPARIN SOD (PORK) LOCK FLUSH 100 UNIT/ML IV SOLN
500.0000 [IU] | Freq: Once | INTRAVENOUS | Status: AC
Start: 2021-03-03 — End: 2021-03-03
  Administered 2021-03-03: 500 [IU]
  Filled 2021-03-03: qty 5

## 2021-03-03 MED ORDER — TRAMADOL HCL 50 MG PO TABS: 50.0000 mg | ORAL_TABLET | Freq: Four times a day (QID) | ORAL | 1 refills | Status: AC | PRN

## 2021-03-03 MED ORDER — ONDANSETRON HCL 8 MG PO TABS: 8.0000 mg | ORAL_TABLET | Freq: Three times a day (TID) | ORAL | 0 refills | Status: AC | PRN

## 2021-03-03 MED ORDER — SODIUM CHLORIDE 0.9% FLUSH
10.0000 mL | Freq: Once | INTRAVENOUS | Status: AC
  Administered 2021-03-03: 10 mL
  Filled 2021-03-03: qty 10

## 2021-03-03 NOTE — Progress Notes (Signed)
Critical Values: T. Bili  5.9 AST  353 Dr Burr Medico notified

## 2021-03-03 NOTE — Telephone Encounter (Signed)
CRITICAL VALUE STICKER  CRITICAL VALUE:TBili  5.9; AST 353  RECEIVER (on-site recipient of call):Beth Saafir Abdullah, [place] NOTIFIED: 03/03/21  2:05 pm  MESSENGER (representative from lab):Jay  MD NOTIFIED: Dr. Burr Medico  TIME OF NOTIFICATION:2:08  RESPONSE:

## 2021-03-03 NOTE — Telephone Encounter (Signed)
Scheduled appts per 5/1 sch msg. Pt aware.

## 2021-03-03 NOTE — Progress Notes (Signed)
[place]   [phone] [phone]   Clinic Follow up Note   Patient Care Team: [name], MD as PCP - General (Infectious Diseases) [name], MD as PCP - Infectious Diseases (Infectious Diseases) [name], MD as Consulting Physician (General Surgery) [name], MD as Consulting Physician (Hematology) [name], MD as Consulting Physician (Radiation Oncology) [name], RN as Oncology Nurse Navigator [name], RN as Oncology Nurse Navigator  Date of Service:  03/03/2021  CHIEF COMPLAINT: F/u ofbilateralbreast cancer  SUMMARY OF ONCOLOGIC HISTORY: Oncology History Overview Note  Cancer Staging Bilateral breast cancer [facility]) Staging form: Breast, AJCC 8th Edition - Pathologic stage from 10/01/2020: Stage IIIC (pT3, pN2a, cM0, G3, ER-, PR-, HER2-) - Signed by Alla Feeling, NP on 10/24/2020  Malignant neoplasm of upper-outer quadrant of left breast in female, estrogen receptor positive ([place]) Staging form: Breast, AJCC 8th Edition - Clinical stage from 03/01/2020: Stage IIIC (cT4, cN0, cM0, G3, ER+, PR-, HER2-) - Signed by [name], MD on 08/22/2020 - Pathologic stage from 10/01/2020: Stage IIIC (pT4b, pN0, cM0, G3, ER-, PR-, HER2-) - Signed by Alla Feeling, NP on 10/24/2020    Malignant neoplasm of upper-outer quadrant of left breast in female, estrogen receptor negative ([place])  02/28/2020 Mammogram   Diagnostic Mammogram 02/28/20  IMPRESSION The 3.9 cm ill defined mass in the left breast posterior depth central 4.3cm to the nipple seen on the mediolateral oblique view onlt with associated difssue skin thickening is highly suspicious for malignancy.    03/01/2020 Cancer Staging   Staging form: Breast, AJCC 8th Edition - Clinical stage from 03/01/2020: Stage IIIC (cT4, cN0, cM0, G3, ER+, PR-, HER2-) - Signed by [name], MD on 08/22/2020   03/01/2020 Initial Biopsy   Diagnosis 03/01/20  1. Breast, left,  needle core biopsy, 9 o'clock, 3-4cmfn - INVASIVE DUCTAL CARCINOMA. SEE NOTE 2. Lymph node, needle/core biopsy, right axilla - INVASIVE DUCTAL CARCINOMA. SEE NOTE Diagnosis Note 1. Invasive carcinoma measures 1.5 cm in greatest linear dimension and appears grade 3. Dr. [name] reviewed the case and concurs with the diagnosis. A breast prognostic profile (ER, PR, Ki-67 and HER2) is pending and will be reported in an addendum. Dr. [name] was notified on 03/04/2020. 2. Carcinoma measures 0.6 cm in greatest linear dimension and appears grade 3. Lymphoid tissue is not identified - this may represent a completely replaced lymph node. A breast prognostic profile (ER, PR, Ki-67 and HER2) is pending and will be reported in an addendum. Dr. [name] reviewed the case and concurs with the diagnosis. Dr. [name] was notified on 03/04/2020.   03/01/2020 Receptors her2   1. PROGNOSTIC INDICATORS Results: IMMUNOHISTOCHEMICAL AND MORPHOMETRIC ANALYSIS PERFORMED MANUALLY The tumor cells are NEGATIVE for Her2 (0). Estrogen Receptor: 20%, POSITIVE, WEAK STAINING INTENSITY Progesterone Receptor: 0%, NEGATIVE Proliferation Marker Ki67: 40% COMMENT: The negative hormone receptor study(ies) in this case has an internal positive control.    2. PROGNOSTIC INDICATORS Results: IMMUNOHISTOCHEMICAL AND MORPHOMETRIC ANALYSIS PERFORMED MANUALLY The tumor cells are NEGATIVE for Her2 (1+). Estrogen Receptor: 10%, POSITIVE, WEAK STAINING INTENSITY Progesterone Receptor: 0%, NEGATIVE Proliferation Marker Ki67: 40% COMMENT: The negative hormone receptor study(ies) in this case has no internal positive control.   03/05/2020 Initial Diagnosis   Malignant neoplasm of upper-outer quadrant of left breast in female, estrogen receptor positive ([place])   03/18/2020 Breast MRI   IMPRESSION: 1. 7 x 9 x 6 cm area of suspicious non masslike enhancement within the central/UPPER RIGHT breast  with anterior skin thickening.  Given biopsy-proven metastatic RIGHT axillary lymph node, tissue sampling of the anterior and posterior aspects of this non masslike RIGHT breast enhancement is recommended to exclude malignancy. 2. 7 x 6 x 5 cm diffuse masslike and nonmasslike enhancement within the majority of the remaining LEFT breast with diffuse LEFT breast skin thickening, compatible with malignancy. Abnormal enhancement is noted along the anterior aspect of the LEFT pectoralis muscle. 3. Three abnormal RIGHT axillary lymph nodes, 1 which is biopsy proven to be metastatic disease. No abnormal LEFT axillary lymph nodes identified.   03/20/2020 Echocardiogram   Baseline ECHO  IMPRESSIONS     1. The patient was reported to be in pain during the study and not able  to participate in this study. All that can be said is that the LV and RV  function are grossly normal. Would recommend to repeat this study when/if  the patient is able to tolerate  the exam.   2. Left ventricular ejection fraction, by estimation, is 60 to 65%. The  left ventricle has normal function. Left ventricular endocardial border  not optimally defined to evaluate regional wall motion. Left ventricular  diastolic function could not be  evaluated.   3. Right ventricular systolic function is normal. The right ventricular  size is normal.   4. The mitral valve was not well visualized. No evidence of mitral valve  regurgitation.   5. The aortic valve was not well visualized. Aortic valve regurgitation  is not visualized.   6. The inferior vena cava is normal in size with greater than 50%  respiratory variability, suggesting right atrial pressure of 3 mmHg.    03/20/2020 Imaging   CT CAP W contrast  IMPRESSION: 1. Small right axillary/subpectoral lymph nodes. Difficult to exclude metastatic disease. 2. Subtle 6 mm low-attenuation lesion in the dome of the liver, not well seen previously. Lesion is too small to characterize. Further evaluation  could be performed with MR abdomen without and with contrast, as clinically indicated. 3. Hepatic steatosis. 4. Aortic atherosclerosis (ICD10-I70.0). Coronary artery calcification.     03/20/2020 Imaging   Whole body bone scan  IMPRESSION: 1. Increased activity noted over the right breast, possibly related to the patient's right breast cancer.   2. Punctate area of increased activity over the left maxillary region, most likely related to sinus disease or dental disease. Increased activity noted over the left mandible also possibly related to dental disease. No other focal bony abnormalities to suggest metastatic disease identified.   03/22/2020 Procedure   PAC placement by Dr [name]    03/22/2020 - 08/23/2020 Chemotherapy   AC q2weeks for 4 cycles 03/22/20-05/03/20 followed by weekly Taxol and Carboplatin q3weeks for 12 weeks starting 05/17/20.                  Week 2 Taxol reduced to half dose and CT was dose reduced starting with week 4 due to thrombocytopenia. Given pancytopenia, will change Carboplatin to 50% dose weekly with Taxol 22m/m2 starting with week 7 (06/13/20). Will hold Carboplatin as needed based on labs. Carboplatin increased to 80% starting with week 8. Carboplatin dose reduced for C11 and held with C12. C12 Taxol postponed to 08/23/20   03/22/2020 Pathology Results   FINAL MICROSCOPIC DIAGNOSIS:   A. BREAST, RIGHT, PUNCH BIOPSY:  - Carcinoma involving dermal lymphatics.  - See comment.   B. BREAST, LEFT, PUNCH BIOPSY:  - Carcinoma involving dermal lymphatics.  - See comment.   COMMENT:  The  morphology is compatible with ductal breast carcinoma.    ADDENDUM:   PROGNOSTIC INDICATOR RESULTS:   Immunohistochemical and morphometric analysis performed manually   The tumor cells are NEGATIVE for Her2 (0%).   Estrogen Receptor:       NEGATIVE, 0%  Progesterone Receptor:   NEGATIVE, 0%    08/13/2020 Breast MRI   IMPRESSION: 1. Interval significant response to  treatment of the diffuse non-mass enhancement involving the UPPER RIGHT breast. There is a solitary residual 5 mm indeterminate mass in the UPPER OUTER QUADRANT at MIDDLE depth with plateau kinetics. 2. Minimal response to treatment of the diffuse non-mass enhancement throughout the LEFT breast. 3. Resolution of enhancement of the skin and nipple-areolar complex of the RIGHT breast. 4. Near complete resolution of enhancement of the skin and nipple-areolar complex of the LEFT breast. Minimal residual nipple-areola complex enhancement persists. 5. The 3 previously identified pathologic RIGHT axillary lymph nodes are now normal in appearance. There is no pathologic lymphadenopathy currently.   08/23/2020 - 01/13/2021 Chemotherapy   Immunotherapy Keytruda q3weeks starting 08/23/20 for 1 year treatment. D/c on 01/13/21 given disease recurrence/progression   10/01/2020 Surgery   MASTECTOMIES WITH RIGHT SENTINEL LYMPH NODE BIOPSIES,RIGHT TARGETED RADIOACTIVE SEED LYMPH NODE, LEFT BREAST MODIFIED RADICAL MASTECTOMY  and RADIOACTIVE SEED GUIDED  TARGETED RIGHT SENTINEL LYMPH NODE BIOPSY by Dr [name]     10/01/2020 Pathology Results   FINAL MICROSCOPIC DIAGNOSIS:   A. BREAST, LEFT, MASTECTOMY:  - Invasive ductal carcinoma, 7 cm.  - Anterior and posterior soft tissue margins not involved by tumor.  - Foci of skin involvement by tumor, see comment.  - Nipple involvement by tumor.  - See oncology table and comment.   B. LYMPH NODE, LEFT AXILLARY, CONTENTS:  - One lymph node with no metastatic carcinoma (0/1).   C. BREAST, RIGHT, MASTECTOMY:  - Invasive ductal carcinoma, greater than 5 cm.  - Margins not involved.  - Subareolar breast tissue involved by tumor.  - See oncology table and comment.   D. LYMPH NODE, RIGHT #2, SENTINEL, EXCISION:  - Metastatic carcinoma in one lymph node, 0.5 cm (1/1).   E. LYMPH NODE, RIGHT #1, SENTINEL, EXCISION:  - Metastatic carcinoma in one lymph node,  1.5 cm (1/1).  - Biopsy site and biopsy clip.   F. LYMPH NODE, RIGHT #3, SENTINEL, EXCISION:  - Metastatic carcinoma in one lymph node, 1.5 cm (1/1).   G. LYMPH NODE, RIGHT #4, SENTINEL, EXCISION:  - One lymph node with no metastatic carcinoma (0/1).   H. LYMPH NODE, RIGHT #5, SENTINEL, EXCISION:  - Metastatic carcinoma in one lymph node, 0.5 cm (1/1).   I. LYMPH NODE, RIGHT #6, SENTINEL, EXCISION:  - One lymph node with no metastatic carcinoma (0/1).     A.  LEFT BREAST PROGNOSTIC INDICATOR RESULTS:   Immunohistochemical and morphometric analysis performed manually   The tumor cells are NEGATIVE for Her2 (0).   Estrogen Receptor:       0%, Negative  Progesterone Receptor:   0%, Negative  Proliferation Marker Ki-67:   80%   Comment: The negative hormone receptor study(ies) in the case has an  internal positive control.   Reference Range Estrogen and Progesterone Receptor       Negative  0%       Positive  >1%   All controls stained appropriately.   C. RIGHT BREAST PROGNOSTIC INDICATOR RESULTS:   Immunohistochemical and morphometric analysis performed manually   The tumor cells are NEGATIVE for Her2 (0).  Estrogen Receptor:       0%, Negative  Progesterone Receptor:   0%, Negative  Proliferation Marker Ki-67:   90%   10/01/2020 Cancer Staging   Staging form: Breast, AJCC 8th Edition - Pathologic stage from 10/01/2020: Stage IIIC (pT4b, pN0, cM0, G3, ER-, PR-, HER2-) - Signed by Alla Feeling, NP on 10/24/2020   10/14/2020 Surgery   RIGHT AXILLARY LYMPH NODE DISSECTION by Dr [name]  -1 /13 positive LN for metastatic carcinoma    11/18/2020 - 01/20/2021 Chemotherapy   Adjuvant Xeloda 1500 mg BID days 1-14 q21 days x6 months, starting 11/18/2020 (will reduce to 1500 mg Am/1000 mg Pm M-F during radiation). Stopped 01/20/21 due to liver mets/disease recurrence   12/10/2020 - 01/24/2021 Radiation Therapy   Adjuvant Radiation to right chest wall and axilla with Dr Lisbeth Renshaw  on 12/10/20-01/24/21   12/16/2020 Tumor Marker   CA 27.29 at 582.2 on 11/14/20   Ca 27.29 at 1387.9 on 12/16/20   01/13/2021 Progression   PET IMPRESSION: 1. Interval development of bulky hypermetabolic liver metastases involving both hepatic lobes. 2. Hypermetabolic lesion in the posterior left hemithorax. This is obscured by small pleural effusion on CT imaging and could be a pulmonary nodule or pleural nodule. Given superimposition of the pleural fluid, pleural nodule is favored. 3. Tiny subtle soft tissue nodule anterior paramidline left hemithorax with low level FDG uptake raises concern for a second site of pleural disease in the left chest. 4.  Aortic Atherosclerois (ICD10-170.0)   01/15/2021 Pathology Results   FINAL MICROSCOPIC DIAGNOSIS:   A. LIVER, RIGHT LOBE, NEEDLE CORE BIOPSY:  - Metastatic carcinoma to liver, consistent with patient's clinical  history of primary breast carcinoma.  See comment    PROGNOSTIC INDICATOR RESULTS:   Immunohistochemical and morphometric analysis performed manually   The tumor cells are NEGATIVE for Her2 (1+).   Estrogen Receptor:       POSITIVE, 20%, WEAK STAINING  Progesterone Receptor:   NEGATIVE  Proliferation Marker Ki-67:   30%    01/27/2021 - 03/03/2021 Chemotherapy   First-line IV Trodelvy 2 weeks on/1 week off starting 01/27/21. After first dose she developed moderate pancytopenia. Her thrombocytopenia did not improve off treatment. With second dose on 02/12/21, will reduce Trodelvy dose by 50% and change to every 2 weeks.  --D/c 03/03/21 due to worsened liver function and progressed liver mets.     Genetic Testing   Negative genetic testing. No pathogenic variants identified on the [facility] CustomNext+RNA panel. The report date is 01/29/2021.   The CustomNext-Cancer + RNAinsight panel  includes sequencing and/or deletion duplication testing of the following 47 genes: APC, ATM, AXIN2, BARD1, BMPR1A, BRCA1, BRCA2, BRIP1, CDH1, CDK4, CDKN2A,  CHEK2, DICER1, MEN1, MLH1, MSH2, MSH3, MSH6, MUTYH, NBN, NF1, NF2, NTHL1, PALB2, PMS2, PTEN, RAD51C, RAD51D, RECQL, RET, SDHA, SDHAF2, SDHB, SDHC, SDHD, SMAD4, SMARCA4, STK11, TP53, TSC1, TSC2 and VHL (sequencing and deletion/duplication); HOXB13, POLD1 and POLE (sequencing only); EPCAM and GREM1 (deletion/duplication only). RNA data is routinely analyzed for use in variant interpretation for all genes.   02/26/2021 Imaging   US liver  IMPRESSION: 1. Numerous liver lesions are identified compatible with diffuse liver metastases. 2. Gallbladder wall thickness is upper limits of normal at 3 mm. This is nonspecific and may reflect incomplete distention.     03/04/2021 -  Chemotherapy    Patient is on Treatment Plan: BREAST CARBOPLATIN / GEMCITABINE D1,8 Q21D       Chemotherapy   Second-line Carboplatin and Gemcitabine 2 weeks  on/1 weeks off starting this week       CURRENT THERAPY:  Second-line Carboplatin and Gemcitabine 2 weeks on/1 weeks off starting this week    INTERVAL HISTORY:  Victoria Coffey is here for a follow up. She was last seen by me 02/26/21. She presents to the clinic with her husband. She notes in the last week her urine is still dark tea colored. She notes her stool is light brown. She notes her stomach is feeling like pens and needles intermittently. She notes Tylenol helps her stomach. She denies fever. She notes having a burst at energy at night and will be able to do light chores. She denies chest discomfort or tightness. She has not had vomiting since last week. She is emotional about her overall poor prognosis with recent worsened liver function. She and her husband would like to proceed with next line treatment if possible and would like more time to think about it.    REVIEW OF SYSTEMS:   Constitutional: Denies fevers, chills or abnormal weight loss Eyes: Denies blurriness of vision Ears, nose, mouth, throat, and face: Denies mucositis or sore throat Respiratory:  Denies cough, dyspnea or wheezes UA: (+) dark urine  Cardiovascular: Denies palpitation, chest discomfort or lower extremity swelling Gastrointestinal:  Denies nausea, heartburn or change in bowel habits Skin: Denies abnormal skin rashes Lymphatics: Denies new lymphadenopathy or easy bruising Neurological:Denies numbness, tingling or new weaknesses Behavioral/Psych: Mood is stable, no new changes  All other systems were reviewed with the patient and are negative.  MEDICAL HISTORY:  Past Medical History:  Diagnosis Date  . Aortic atherosclerosis ([place])   . Breast cancer ([place]) dx'd 1993   left  . Fatty liver   . History of left breast cancer   . HIV infection ([place])   . Hypertension   . Port-A-Cath in place     SURGICAL HISTORY: Past Surgical History:  Procedure Laterality Date  . AXILLARY LYMPH NODE DISSECTION Right 10/14/2020   Procedure: left axilla removal of two drains right axillary lymph node dissection;  Surgeon: [name], MD;  Location: [facility];  Service: General;  Laterality: Right;  PEC BLOCK, RNFA OR PA  . BREAST BIOPSY Bilateral 03/22/2020   Procedure: BILATERAL BREAST PUNCH BIOPSIES;  Surgeon: [name], MD;  Location: [place];  Service: General;  Laterality: Bilateral;  . BREAST LUMPECTOMY    . CESAREAN SECTION    . left breast lumpectomy  1993  . MASTECTOMY W/ SENTINEL NODE BIOPSY Bilateral 10/01/2020  . MASTECTOMY W/ SENTINEL NODE BIOPSY Bilateral 10/01/2020   Procedure: BILATERAL MASTECTOMIES WITH RIGHT SENTINEL LYMPH NODE BIOPSIES,RIGHT TARGETED RADIOACTIVE SEED LYMPH NODE, LEFT BREAST MODIFIED RADICAL MASTECTOMY ;  Surgeon: [name], MD;  Location: [place];  Service: General;  Laterality: Bilateral;  PEC BLOCK, RNFA  . PORTACATH PLACEMENT Right 03/22/2020   Procedure: INSERTION PORT-A-CATH WITH ULTRASOUND GUIDANCE;  Surgeon: [name], MD;  Location: [place];  Service: General;  Laterality: Right;  . RADIOACTIVE SEED GUIDED AXILLARY SENTINEL LYMPH NODE  Right 10/01/2020   Procedure: RADIOACTIVE SEED GUIDED  TARGETED RIGHT SENTINEL LYMPH NODE BIOPSY;  Surgeon: [name], MD;  Location: [place];  Service: General;  Laterality: Right;  PEC BLOCK, RNFA    I have reviewed the social history and family history with the patient and they are unchanged from previous note.  ALLERGIES:  is allergic to codeine, other, grapefruit bioflavonoid complex, pomegranate [punica], and shellfish allergy.  MEDICATIONS:  Current Outpatient Medications  Medication Sig  Dispense Refill  . acetaminophen (TYLENOL) 500 MG tablet Take 1,000 mg by mouth every 6 (six) hours as needed for moderate pain or headache.     . eltrombopag (PROMACTA) 50 MG tablet Take 1 tablet (50 mg total) by mouth daily. Take on an empty stomach 1 hour before a meal or 2 hours after 30 tablet 2  . emtricitabine-rilpivir-tenofovir AF (ODEFSEY) 200-25-25 MG TABS tablet Take 1 tablet by mouth daily with breakfast. 30 tablet 11  . hydrochlorothiazide (HYDRODIURIL) 25 MG tablet TAKE 1 TABLET(25 MG) BY MOUTH DAILY (Patient taking differently: Take 25 mg by mouth daily.) 30 tablet 5  . lidocaine-prilocaine (EMLA) cream Apply 1 application topically See admin instructions. Apply 1-2 hours prior to access    . loratadine (CLARITIN) 10 MG tablet Take 10 mg by mouth See admin instructions. Takes 3-4 days before and after treatment    . ondansetron (ZOFRAN) 8 MG tablet Take 1 tablet (8 mg total) by mouth every 8 (eight) hours as needed for nausea or vomiting. 20 tablet 0  . potassium chloride SA (KLOR-CON) 20 MEQ tablet Take 1 tablet (20 mEq total) by mouth daily. 30 tablet 0  . prochlorperazine (COMPAZINE) 10 MG tablet Take 1 tablet (10 mg total) by mouth every 6 (six) hours as needed (Nausea or vomiting). 30 tablet 2  . traMADol (ULTRAM) 50 MG tablet Take 1 tablet (50 mg total) by mouth every 6 (six) hours as needed (mild pain). 30 tablet 1   No current facility-administered medications for this visit.     PHYSICAL EXAMINATION: ECOG PERFORMANCE STATUS: 2 - Symptomatic, <50% confined to bed  Vitals:   03/03/21 1338  BP: (!) 142/100  Pulse: (!) 114  Temp: (!) 97.5 F (36.4 C)  SpO2: 100%   Filed Weights   03/03/21 1338  Weight: 119 lb (54 kg)    GENERAL:alert, no distress and comfortable SKIN: skin color, texture, turgor are normal, no rashes or significant lesions EYES: normal, Conjunctiva are pink and non-injected, sclera clear  NECK: supple, thyroid normal size, non-tender, without nodularity LYMPH:  no palpable lymphadenopathy in the cervical, axillary LUNGS: clear to auscultation and percussion with normal breathing effort HEART: regular rate & rhythm and no murmurs and no lower extremity edema ABDOMEN:abdomen soft, non-tender and normal bowel sounds (+) Abdominal distention (+) Palpable Hepatomegaly, 6cm below ribcage and palpable left lobe Musculoskeletal:no cyanosis of digits and no clubbing  NEURO: alert & oriented x 3 with fluent speech, no focal motor/sensory deficits  LABORATORY DATA:  I have reviewed the data as listed CBC Latest Ref Rng & Units 03/03/2021 02/26/2021 02/17/2021  WBC 4.0 - 10.5 K/uL 9.2 9.7 7.9  Hemoglobin 12.0 - 15.0 g/dL 9.9(L) 9.6(L) 10.2(L)  Hematocrit 36.0 - 46.0 % 29.5(L) 29.2(L) 28.2(L)  Platelets 150 - 400 K/uL 73(L) 73(L) 38(L)     CMP Latest Ref Rng & Units 03/03/2021 02/26/2021 02/17/2021  Glucose 70 - 99 mg/dL 149(H) 107(H) 121(H)  BUN 8 - 23 mg/dL _0 Creatinine 0.44 - 1.00 mg/dL 0.85 0.82 0.82  Sodium 135 - 145 mmol/L 132(L) 131(L) 134(L)  Potassium 3.5 - 5.1 mmol/L 3.8 4.1 4.3  Chloride 98 - 111 mmol/L 99 101 101  CO2 22 - 32 mmol/L 22 18(L) 23  Calcium 8.9 - 10.3 mg/dL 8.5(L) 8.5(L) 8.5(L)  Total Protein 6.5 - 8.1 g/dL 6.0(L) 6.0(L) 6.0(L)  Total Bilirubin 0.3 - 1.2 mg/dL 5.9(HH) 4.8(HH) 1.9(H)  Alkaline Phos 38 - 126 U/L 2,512(H) 2,308(H) 1,773(H)  AST  15 - 41 U/L 353(HH) 295(HH) 207(HH)  ALT 0 - 44 U/L 71(H) 90(H) 126(H)       RADIOGRAPHIC STUDIES: I have personally reviewed the radiological images as listed and agreed with the findings in the report. No results found.   ASSESSMENT & PLAN:  Victoria Coffey is a 65 y.o. female with    1.InflammatoryLeft breast cancer,pT4N0Mx,ypT4N0,triple negativeAND Right breast cancer withaxillary node metastasis,pT3N2aM0,ERweakly+,PR/HER2-,ypT3N2a, triple negative,GradeIII, diffuse liver metastasis(ER weakly+/PR-/HER-)in 12/2020 -She was diagnosed with left breast cancer in 02/2020.She presented with inflammatory left breast cancer. Biopsy of left breast mass and right axillary LN were positive for Invasive ductal carcinoma.Further work up showed 9cm nodular area in right breast and skin biopsy confirmed cancer involvement.Staging scansin May 2021were negative for distant metastasis. -She completed AC X4 and weekly TC 03/22/20-08/23/20. She underwent b/l mastectomy with Dr [name] on 10/01/20.Shealsounderwent right axillary lymph node dissection on 10/14/2020 and completed Adjuvant radiation with Dr Moody2/8/22-3/25/22. -Given recentpublish [employer] 522 trial data,I started her on Immunotherapy Keytruda q3weeks for 1 year of treatment beginning 08/23/20.Due tohersignificantresidual diseasein LNs,Istarted her onadjuvant Xelodafor 6 months on 11/18/20. Unfortunately she progressed/recurred with diffuse liver mets on 01/13/21 PET and 01/15/21 Liver biopsy (Her/PR negative/ER 20% weakly positive).Keytruda/Xeloda d/c.  -I have requested [facility] one on her biopsy sample,results still pending.  -Istarted her on first-line IV Trodelvy 2 weeks on/1 week offbeginning 01/27/21.After first dose she developed moderate pancytopenia. Her thrombocytopenia did not improve off treatment so I reduced Trodelvy dose by 50% and change to every 2 weeks starting 02/12/21 (second dose). -Since she started chemotherapy Ivette Loyal), LFTs slightly improved. Unfortunately  s/p 2 doses, her LFTs and tbili significantly worsened. Work up with liver US showed no CBD obstruction as etiology. This is from her liver mets. The only way to improve her jaundice and liver function is to treat her cancer. However, with decreased liver function, her treatment options are very limited.  -I discussed the option of next line Carboplatin and Gemcitabine 2 weeks on/1 weeks off starting this week to help control her cancer and reduce impact on her liver function. She is agreeable.  -Chemotherapy consent: Side effects including but does not not limited to, fatigue, nausea, vomiting, diarrhea, hair loss, neuropathy, fluid retention, renal and kidney dysfunction, neutropenic fever, needed for blood transfusion, bleeding, were discussed with patient in great detail. She agrees to proceed. -The goal of therapy is palliative, to prolong her life -Due to her previous significant cytopenias, I will reduce dose to carbo AUC 1, and gemcitabine 600 mg/m, with G-CSF as needed -I discussed with this sudden decline in liver function her prognosis has changed. I had lengthy discussion about the overall guarded prognosis, expectations moving forward with patient and husband today.  -F/u next week before second dose.    2.Transaminitis, hyperbilirubinemia, Jaundice, secondary toliver metastasis -Since she started chemotherapy Ivette Loyal), LFTs slightly improved.  -However, 02/26/21 labs show AST 295, ALT 90, Alk Phos 2308, Tbili 4.8. She has had dark urine since 3 days before. She also has hepatomegaly and mild jaundice of eyes on exam (02/26/21).  -Her 02/26/21 US showed known liver mets and Gallbladder wall thickness is upper limits of normal at 3 mm. No obvious CBD obstruction.  -In the past week, she continues to have clinical jaundice and her hepatomegaly has increased on exam (03/03/21). Her labs today show, AST 353, ALT 71, Alk Phos 2512, Tbili 5.9.  -Will d/c Trodelvy. Her future treatment options  are limited given her abnormal liver function  3.Cytopenias,  Moderate Thrombocytopenia -secondary to chemo Ivette Loyal) -Mild and stableanemia and intermittent leukopenia.Continue monitoring labs weekly -After C1D1 Trodelvy her blood counts dropped.After being off Trodelvy for 2 weeks, labs improved, but her plt remain low. Dose reduced to every 2 weeks form 02/12/21. Will only giveGCSFas needed. -Her Plt remain in low ranges. I started her on Promacta 72m daily (02/12/21). If not enough, will given NPlate.   4. H/o Left breast cancer, Dx in 1993s/p lumpectomy, radiation, chemo, Tamoxifen.Genetic Testingfrom 01/14/21 wasnegative for pathogenetic mutations  5. Comorbidities: HIV(+)Dx 1993, HTN -Her HIV has been undetectable and well controlled. She is currently being seen by ID Dr SGraylon Good-Ipreviouslyreferredher for Evusheld injection for COVID prevention  6. Goal of care discussion  -We again discussed the incurable nature of her cancer, and the overall garded prognosis, due to the rapid disease progression, and worsening liver function -The patient understands the goal of care is palliative. -I recommend DNR/DNI, patient is agreeable, however her husband is not ready for that decision.  We will give patient and her family to process and discuss  -I discussed with decreased liver function and increased disease burden, she will likely become more symptomatic and may require more medical help at home. I recommend Palliative home care for this extra medical help. She will think about it. Her husband declined it today  -I offered sEducation officer, museumand chaplain counseling today, her husband declined   PLAN: -Labs reviewed with worsened liver function.  -Proceed with Carboplatin and Gemcitabine this Wednesday with dose reduction  -Lab, flush, F/u and Carbo/Gem next week before second dose. -Patient and her husband are overwhelmed by the bad news today, will give them time to process and  think about the GHarper code status and palliative home care etc  -I called her after clinic about her chemo schedule today   No problem-specific Assessment & Plan notes found for this encounter.   No orders of the defined types were placed in this encounter.  All questions were answered. The patient knows to call the clinic with any problems, questions or concerns. No barriers to learning was detected. The total time spent in the appointment was 40 minutes.     YTruitt Merle MD 03/03/2021   I, AJoslyn Devon am acting as scribe for YTruitt Merle MD.   I have reviewed the above documentation for accuracy and completeness, and I agree with the above.

## 2021-03-04 LAB — CANCER ANTIGEN 27.29: CA 27.29: 9010 U/mL — ABNORMAL HIGH (ref 0.0–38.6)

## 2021-03-05 ENCOUNTER — Other Ambulatory Visit: Payer: Self-pay

## 2021-03-05 ENCOUNTER — Inpatient Hospital Stay: Payer: BC Managed Care – PPO

## 2021-03-05 VITALS — BP 112/88 | HR 127 | Temp 98.0°F | Resp 17

## 2021-03-05 DIAGNOSIS — Z5111 Encounter for antineoplastic chemotherapy: Secondary | ICD-10-CM | POA: Diagnosis not present

## 2021-03-05 DIAGNOSIS — C50412 Malignant neoplasm of upper-outer quadrant of left female breast: Secondary | ICD-10-CM

## 2021-03-05 MED ORDER — METHYLPREDNISOLONE SODIUM SUCC 125 MG IJ SOLR
INTRAMUSCULAR | Status: AC
  Filled 2021-03-05: qty 2

## 2021-03-05 MED ORDER — HEPARIN SOD (PORK) LOCK FLUSH 100 UNIT/ML IV SOLN
500.0000 [IU] | Freq: Once | INTRAVENOUS | Status: AC | PRN
  Administered 2021-03-05: 500 [IU]
  Filled 2021-03-05: qty 5

## 2021-03-05 MED ORDER — SODIUM CHLORIDE 0.9 % IV SOLN
Freq: Once | INTRAVENOUS | Status: AC
  Filled 2021-03-05: qty 250

## 2021-03-05 MED ORDER — SODIUM CHLORIDE 0.9 % IV SOLN
10.0000 mg | Freq: Once | INTRAVENOUS | Status: DC
  Administered 2021-03-05: 10 mg via INTRAVENOUS
  Filled 2021-03-05: qty 10

## 2021-03-05 MED ORDER — SODIUM CHLORIDE 0.9% FLUSH
10.0000 mL | INTRAVENOUS | Status: DC | PRN
  Administered 2021-03-05: 10 mL
  Filled 2021-03-05: qty 10

## 2021-03-05 MED ORDER — PALONOSETRON HCL INJECTION 0.25 MG/5ML
INTRAVENOUS | Status: AC
  Filled 2021-03-05: qty 5

## 2021-03-05 MED ORDER — METHYLPREDNISOLONE SODIUM SUCC 125 MG IJ SOLR: 125.0000 mg | Freq: Once | INTRAMUSCULAR | Status: DC

## 2021-03-05 MED ORDER — METHYLPREDNISOLONE SODIUM SUCC 125 MG IJ SOLR
125.0000 mg | Freq: Once | INTRAMUSCULAR | Status: AC
  Administered 2021-03-05: 125 mg via INTRAVENOUS

## 2021-03-05 MED ORDER — SODIUM CHLORIDE 0.9 % IV SOLN
82.4000 mg | Freq: Once | INTRAVENOUS | Status: AC
  Administered 2021-03-05: 80 mg via INTRAVENOUS
  Filled 2021-03-05: qty 8

## 2021-03-05 MED ORDER — PALONOSETRON HCL INJECTION 0.25 MG/5ML
0.2500 mg | Freq: Once | INTRAVENOUS | Status: AC
  Administered 2021-03-05: 0.25 mg via INTRAVENOUS

## 2021-03-05 MED ORDER — SODIUM CHLORIDE 0.9 % IV SOLN
600.0000 mg/m2 | Freq: Once | INTRAVENOUS | Status: AC
  Administered 2021-03-05: 912 mg via INTRAVENOUS
  Filled 2021-03-05: qty 23.99

## 2021-03-05 NOTE — Patient Instructions (Addendum)
[facility]  Discharge Instructions: Thank you for choosing [facility] to provide your oncology and hematology care.   If you have a lab appointment with the [facility], please go directly to the [facility] and check in at the registration area.   Wear comfortable clothing and clothing appropriate for easy access to any Portacath or PICC line.   We strive to give you quality time with your provider. You may need to reschedule your appointment if you arrive late (15 or more minutes).  Arriving late affects you and other patients whose appointments are after yours.  Also, if you miss three or more appointments without notifying the office, you may be dismissed from the clinic at the provider's discretion.      For prescription refill requests, have your pharmacy contact our office and allow 72 hours for refills to be completed.    Today you received the following chemotherapy and/or immunotherapy agents: Gemzar (gemcitabine)/Carboplatin.      To help prevent nausea and vomiting after your treatment, we encourage you to take your nausea medication as directed.  BELOW ARE SYMPTOMS THAT SHOULD BE REPORTED IMMEDIATELY: . *FEVER GREATER THAN 100.4 F (38 C) OR HIGHER . *CHILLS OR SWEATING . *NAUSEA AND VOMITING THAT IS NOT CONTROLLED WITH YOUR NAUSEA MEDICATION . *UNUSUAL SHORTNESS OF BREATH . *UNUSUAL BRUISING OR BLEEDING . *URINARY PROBLEMS (pain or burning when urinating, or frequent urination) . *BOWEL PROBLEMS (unusual diarrhea, constipation, pain near the anus) . TENDERNESS IN MOUTH AND THROAT WITH OR WITHOUT PRESENCE OF ULCERS (sore throat, sores in mouth, or a toothache) . UNUSUAL RASH, SWELLING OR PAIN  . UNUSUAL VAGINAL DISCHARGE OR ITCHING   Items with * indicate a potential emergency and should be followed up as soon as possible or go to the Emergency Department if any problems should occur.  Please show the CHEMOTHERAPY ALERT CARD  or IMMUNOTHERAPY ALERT CARD at check-in to the Emergency Department and triage nurse.  Should you have questions after your visit or need to cancel or reschedule your appointment, please contact [facility]  Dept: [phone]  and follow the prompts.  Office hours are 8:00 a.m. to 4:30 p.m. Monday - Friday. Please note that voicemails left after 4:00 p.m. may not be returned until the following business day.  We are closed weekends and major holidays. You have access to a nurse at all times for urgent questions. Please call the main number to the clinic Dept: [phone] and follow the prompts.   For any non-urgent questions, you may also contact your provider using MyChart. We now offer e-Visits for anyone 17 and older to request care online for non-urgent symptoms. For details visit [email or web address].   Also download the MyChart app! Go to the app store, search "MyChart", open the app, select [facility], and log in with your MyChart username and password.  Due to Covid, a mask is required upon entering the hospital/clinic. If you do not have a mask, one will be given to you upon arrival. For doctor visits, patients may have 1 support person aged 48 or older with them. For treatment visits, patients cannot have anyone with them due to current Covid guidelines and our immunocompromised population.   Gemcitabine injection What is this medicine? GEMCITABINE (jem SYE ta been) is a chemotherapy drug. This medicine is used to treat many types of cancer like breast cancer, lung cancer, pancreatic cancer, and ovarian cancer. This medicine may  be used for other purposes; ask your health care provider or pharmacist if you have questions. COMMON BRAND NAME(S): Gemzar, Infugem What should I tell my health care provider before I take this medicine? They need to know if you have any of these conditions:  blood disorders  infection  kidney disease  liver  disease  lung or breathing disease, like asthma  recent or ongoing radiation therapy  an unusual or allergic reaction to gemcitabine, other chemotherapy, other medicines, foods, dyes, or preservatives  pregnant or trying to get pregnant  breast-feeding How should I use this medicine? This drug is given as an infusion into a vein. It is administered in a hospital or clinic by a specially trained health care professional. Talk to your pediatrician regarding the use of this medicine in children. Special care may be needed. Overdosage: If you think you have taken too much of this medicine contact a poison control center or emergency room at once. NOTE: This medicine is only for you. Do not share this medicine with others. What if I miss a dose? It is important not to miss your dose. Call your doctor or health care professional if you are unable to keep an appointment. What may interact with this medicine?  medicines to increase blood counts like filgrastim, pegfilgrastim, sargramostim  some other chemotherapy drugs like cisplatin  vaccines Talk to your doctor or health care professional before taking any of these medicines:  acetaminophen  aspirin  ibuprofen  ketoprofen  naproxen This list may not describe all possible interactions. Give your health care provider a list of all the medicines, herbs, non-prescription drugs, or dietary supplements you use. Also tell them if you smoke, drink alcohol, or use illegal drugs. Some items may interact with your medicine. What should I watch for while using this medicine? Visit your doctor for checks on your progress. This drug may make you feel generally unwell. This is not uncommon, as chemotherapy can affect healthy cells as well as cancer cells. Report any side effects. Continue your course of treatment even though you feel ill unless your doctor tells you to stop. In some cases, you may be given additional medicines to help with side  effects. Follow all directions for their use. Call your doctor or health care professional for advice if you get a fever, chills or sore throat, or other symptoms of a cold or flu. Do not treat yourself. This drug decreases your body's ability to fight infections. Try to avoid being around people who are sick. This medicine may increase your risk to bruise or bleed. Call your doctor or health care professional if you notice any unusual bleeding. Be careful brushing and flossing your teeth or using a toothpick because you may get an infection or bleed more easily. If you have any dental work done, tell your dentist you are receiving this medicine. Avoid taking products that contain aspirin, acetaminophen, ibuprofen, naproxen, or ketoprofen unless instructed by your doctor. These medicines may hide a fever. Do not become pregnant while taking this medicine or for 6 months after stopping it. Women should inform their doctor if they wish to become pregnant or think they might be pregnant. Men should not father a child while taking this medicine and for 3 months after stopping it. There is a potential for serious side effects to an unborn child. Talk to your health care professional or pharmacist for more information. Do not breast-feed an infant while taking this medicine or for at least 1  week after stopping it. Men should inform their doctors if they wish to father a child. This medicine may lower sperm counts. Talk with your doctor or health care professional if you are concerned about your fertility. What side effects may I notice from receiving this medicine? Side effects that you should report to your doctor or health care professional as soon as possible:  allergic reactions like skin rash, itching or hives, swelling of the face, lips, or tongue  breathing problems  pain, redness, or irritation at site where injected  signs and symptoms of a dangerous change in heartbeat or heart rhythm like chest  pain; dizziness; fast or irregular heartbeat; palpitations; feeling faint or lightheaded, falls; breathing problems  signs of decreased platelets or bleeding - bruising, pinpoint red spots on the skin, black, tarry stools, blood in the urine  signs of decreased red blood cells - unusually weak or tired, feeling faint or lightheaded, falls  signs of infection - fever or chills, cough, sore throat, pain or difficulty passing urine  signs and symptoms of kidney injury like trouble passing urine or change in the amount of urine  signs and symptoms of liver injury like dark yellow or brown urine; general ill feeling or flu-like symptoms; light-colored stools; loss of appetite; nausea; right upper belly pain; unusually weak or tired; yellowing of the eyes or skin  swelling of ankles, feet, hands Side effects that usually do not require medical attention (report to your doctor or health care professional if they continue or are bothersome):  constipation  diarrhea  hair loss  loss of appetite  nausea  rash  vomiting This list may not describe all possible side effects. Call your doctor for medical advice about side effects. You may report side effects to FDA at 1-800-FDA-1088. Where should I keep my medicine? This drug is given in a hospital or clinic and will not be stored at home. NOTE: This sheet is a summary. It may not cover all possible information. If you have questions about this medicine, talk to your doctor, pharmacist, or health care provider.  2021 Elsevier/Gold Standard (2018-01-12 18:06:11)  Carboplatin injection What is this medicine? CARBOPLATIN (KAR boe pla tin) is a chemotherapy drug. It targets fast dividing cells, like cancer cells, and causes these cells to die. This medicine is used to treat ovarian cancer and many other cancers. This medicine may be used for other purposes; ask your health care provider or pharmacist if you have questions. COMMON BRAND NAME(S):  Paraplatin What should I tell my health care provider before I take this medicine? They need to know if you have any of these conditions:  blood disorders  hearing problems  kidney disease  recent or ongoing radiation therapy  an unusual or allergic reaction to carboplatin, cisplatin, other chemotherapy, other medicines, foods, dyes, or preservatives  pregnant or trying to get pregnant  breast-feeding How should I use this medicine? This drug is usually given as an infusion into a vein. It is administered in a hospital or clinic by a specially trained health care professional. Talk to your pediatrician regarding the use of this medicine in children. Special care may be needed. Overdosage: If you think you have taken too much of this medicine contact a poison control center or emergency room at once. NOTE: This medicine is only for you. Do not share this medicine with others. What if I miss a dose? It is important not to miss a dose. Call your doctor or health care  professional if you are unable to keep an appointment. What may interact with this medicine?  medicines for seizures  medicines to increase blood counts like filgrastim, pegfilgrastim, sargramostim  some antibiotics like amikacin, gentamicin, neomycin, streptomycin, tobramycin  vaccines Talk to your doctor or health care professional before taking any of these medicines:  acetaminophen  aspirin  ibuprofen  ketoprofen  naproxen This list may not describe all possible interactions. Give your health care provider a list of all the medicines, herbs, non-prescription drugs, or dietary supplements you use. Also tell them if you smoke, drink alcohol, or use illegal drugs. Some items may interact with your medicine. What should I watch for while using this medicine? Your condition will be monitored carefully while you are receiving this medicine. You will need important blood work done while you are taking this  medicine. This drug may make you feel generally unwell. This is not uncommon, as chemotherapy can affect healthy cells as well as cancer cells. Report any side effects. Continue your course of treatment even though you feel ill unless your doctor tells you to stop. In some cases, you may be given additional medicines to help with side effects. Follow all directions for their use. Call your doctor or health care professional for advice if you get a fever, chills or sore throat, or other symptoms of a cold or flu. Do not treat yourself. This drug decreases your body's ability to fight infections. Try to avoid being around people who are sick. This medicine may increase your risk to bruise or bleed. Call your doctor or health care professional if you notice any unusual bleeding. Be careful brushing and flossing your teeth or using a toothpick because you may get an infection or bleed more easily. If you have any dental work done, tell your dentist you are receiving this medicine. Avoid taking products that contain aspirin, acetaminophen, ibuprofen, naproxen, or ketoprofen unless instructed by your doctor. These medicines may hide a fever. Do not become pregnant while taking this medicine. Women should inform their doctor if they wish to become pregnant or think they might be pregnant. There is a potential for serious side effects to an unborn child. Talk to your health care professional or pharmacist for more information. Do not breast-feed an infant while taking this medicine. What side effects may I notice from receiving this medicine? Side effects that you should report to your doctor or health care professional as soon as possible:  allergic reactions like skin rash, itching or hives, swelling of the face, lips, or tongue  signs of infection - fever or chills, cough, sore throat, pain or difficulty passing urine  signs of decreased platelets or bleeding - bruising, pinpoint red spots on the skin,  black, tarry stools, nosebleeds  signs of decreased red blood cells - unusually weak or tired, fainting spells, lightheadedness  breathing problems  changes in hearing  changes in vision  chest pain  high blood pressure  low blood counts - This drug may decrease the number of white blood cells, red blood cells and platelets. You may be at increased risk for infections and bleeding.  nausea and vomiting  pain, swelling, redness or irritation at the injection site  pain, tingling, numbness in the hands or feet  problems with balance, talking, walking  trouble passing urine or change in the amount of urine Side effects that usually do not require medical attention (report to your doctor or health care professional if they continue or are bothersome):  hair loss  loss of appetite  metallic taste in the mouth or changes in taste This list may not describe all possible side effects. Call your doctor for medical advice about side effects. You may report side effects to FDA at 1-800-FDA-1088. Where should I keep my medicine? This drug is given in a hospital or clinic and will not be stored at home. NOTE: This sheet is a summary. It may not cover all possible information. If you have questions about this medicine, talk to your doctor, pharmacist, or health care provider.  2021 Elsevier/Gold Standard (2008-01-24 14:38:05)

## 2021-03-05 NOTE — Progress Notes (Signed)
Per [name] office note from 03/03/2021, okay to treat today with most recent labs and HR 127.

## 2021-03-05 NOTE — Addendum Note (Signed)
Addended by: Dicie Beam D on: 03/05/2021 09:13 AM   Modules accepted: Orders

## 2021-03-05 NOTE — Progress Notes (Signed)
[place]   [phone] Fax:([phone]) [phone]   Clinic Follow up Note   Patient Care Team: [name], MD as PCP - General (Infectious Diseases) [name], MD as PCP - Infectious Diseases (Infectious Diseases) [name], MD as Consulting Physician (General Surgery) [name], MD as Consulting Physician (Hematology) [name], MD as Consulting Physician (Radiation Oncology) [name], RN as Oncology Nurse Navigator [name], RN as Oncology Nurse Navigator  Date of Service:  03/10/2021  CHIEF COMPLAINT: F/u ofbilateralbreast cancer  SUMMARY OF ONCOLOGIC HISTORY: Oncology History Overview Note  Cancer Staging Bilateral breast cancer [facility]) Staging form: Breast, AJCC 8th Edition - Pathologic stage from 10/01/2020: Stage IIIC (pT3, pN2a, cM0, G3, ER-, PR-, HER2-) - Signed by Alla Feeling, NP on 10/24/2020  Malignant neoplasm of upper-outer quadrant of left breast in female, estrogen receptor positive ([place]) Staging form: Breast, AJCC 8th Edition - Clinical stage from 03/01/2020: Stage IIIC (cT4, cN0, cM0, G3, ER+, PR-, HER2-) - Signed by [name], MD on 08/22/2020 - Pathologic stage from 10/01/2020: Stage IIIC (pT4b, pN0, cM0, G3, ER-, PR-, HER2-) - Signed by Alla Feeling, NP on 10/24/2020    Malignant neoplasm of upper-outer quadrant of left breast in female, estrogen receptor negative ([place])  02/28/2020 Mammogram   Diagnostic Mammogram 02/28/20  IMPRESSION The 3.9 cm ill defined mass in the left breast posterior depth central 4.3cm to the nipple seen on the mediolateral oblique view onlt with associated difssue skin thickening is highly suspicious for malignancy.    03/01/2020 Cancer Staging   Staging form: Breast, AJCC 8th Edition - Clinical stage from 03/01/2020: Stage IIIC (cT4, cN0, cM0, G3, ER+, PR-, HER2-) - Signed by [name], MD on 08/22/2020   03/01/2020 Initial Biopsy   Diagnosis 03/01/20  1. Breast, left,  needle core biopsy, 9 o'clock, 3-4cmfn - INVASIVE DUCTAL CARCINOMA. SEE NOTE 2. Lymph node, needle/core biopsy, right axilla - INVASIVE DUCTAL CARCINOMA. SEE NOTE Diagnosis Note 1. Invasive carcinoma measures 1.5 cm in greatest linear dimension and appears grade 3. Dr. [name] reviewed the case and concurs with the diagnosis. A breast prognostic profile (ER, PR, Ki-67 and HER2) is pending and will be reported in an addendum. Dr. [name] was notified on 03/04/2020. 2. Carcinoma measures 0.6 cm in greatest linear dimension and appears grade 3. Lymphoid tissue is not identified - this may represent a completely replaced lymph node. A breast prognostic profile (ER, PR, Ki-67 and HER2) is pending and will be reported in an addendum. Dr. [name] reviewed the case and concurs with the diagnosis. Dr. [name] was notified on 03/04/2020.   03/01/2020 Receptors her2   1. PROGNOSTIC INDICATORS Results: IMMUNOHISTOCHEMICAL AND MORPHOMETRIC ANALYSIS PERFORMED MANUALLY The tumor cells are NEGATIVE for Her2 (0). Estrogen Receptor: 20%, POSITIVE, WEAK STAINING INTENSITY Progesterone Receptor: 0%, NEGATIVE Proliferation Marker Ki67: 40% COMMENT: The negative hormone receptor study(ies) in this case has an internal positive control.    2. PROGNOSTIC INDICATORS Results: IMMUNOHISTOCHEMICAL AND MORPHOMETRIC ANALYSIS PERFORMED MANUALLY The tumor cells are NEGATIVE for Her2 (1+). Estrogen Receptor: 10%, POSITIVE, WEAK STAINING INTENSITY Progesterone Receptor: 0%, NEGATIVE Proliferation Marker Ki67: 40% COMMENT: The negative hormone receptor study(ies) in this case has no internal positive control.   03/05/2020 Initial Diagnosis   Malignant neoplasm of upper-outer quadrant of left breast in female, estrogen receptor positive ([place])   03/18/2020 Breast MRI   IMPRESSION: 1. 7 x 9 x 6 cm area of suspicious non masslike enhancement within the central/UPPER RIGHT breast  with anterior skin thickening.  Given biopsy-proven metastatic RIGHT axillary lymph node, tissue sampling of the anterior and posterior aspects of this non masslike RIGHT breast enhancement is recommended to exclude malignancy. 2. 7 x 6 x 5 cm diffuse masslike and nonmasslike enhancement within the majority of the remaining LEFT breast with diffuse LEFT breast skin thickening, compatible with malignancy. Abnormal enhancement is noted along the anterior aspect of the LEFT pectoralis muscle. 3. Three abnormal RIGHT axillary lymph nodes, 1 which is biopsy proven to be metastatic disease. No abnormal LEFT axillary lymph nodes identified.   03/20/2020 Echocardiogram   Baseline ECHO  IMPRESSIONS     1. The patient was reported to be in pain during the study and not able  to participate in this study. All that can be said is that the LV and RV  function are grossly normal. Would recommend to repeat this study when/if  the patient is able to tolerate  the exam.   2. Left ventricular ejection fraction, by estimation, is 60 to 65%. The  left ventricle has normal function. Left ventricular endocardial border  not optimally defined to evaluate regional wall motion. Left ventricular  diastolic function could not be  evaluated.   3. Right ventricular systolic function is normal. The right ventricular  size is normal.   4. The mitral valve was not well visualized. No evidence of mitral valve  regurgitation.   5. The aortic valve was not well visualized. Aortic valve regurgitation  is not visualized.   6. The inferior vena cava is normal in size with greater than 50%  respiratory variability, suggesting right atrial pressure of 3 mmHg.    03/20/2020 Imaging   CT CAP W contrast  IMPRESSION: 1. Small right axillary/subpectoral lymph nodes. Difficult to exclude metastatic disease. 2. Subtle 6 mm low-attenuation lesion in the dome of the liver, not well seen previously. Lesion is too small to characterize. Further evaluation  could be performed with MR abdomen without and with contrast, as clinically indicated. 3. Hepatic steatosis. 4. Aortic atherosclerosis (ICD10-I70.0). Coronary artery calcification.     03/20/2020 Imaging   Whole body bone scan  IMPRESSION: 1. Increased activity noted over the right breast, possibly related to the patient's right breast cancer.   2. Punctate area of increased activity over the left maxillary region, most likely related to sinus disease or dental disease. Increased activity noted over the left mandible also possibly related to dental disease. No other focal bony abnormalities to suggest metastatic disease identified.   03/22/2020 Procedure   PAC placement by Dr [name]    03/22/2020 - 08/23/2020 Chemotherapy   AC q2weeks for 4 cycles 03/22/20-05/03/20 followed by weekly Taxol and Carboplatin q3weeks for 12 weeks starting 05/17/20.                  Week 2 Taxol reduced to half dose and CT was dose reduced starting with week 4 due to thrombocytopenia. Given pancytopenia, will change Carboplatin to 50% dose weekly with Taxol 22m/m2 starting with week 7 (06/13/20). Will hold Carboplatin as needed based on labs. Carboplatin increased to 80% starting with week 8. Carboplatin dose reduced for C11 and held with C12. C12 Taxol postponed to 08/23/20   03/22/2020 Pathology Results   FINAL MICROSCOPIC DIAGNOSIS:   A. BREAST, RIGHT, PUNCH BIOPSY:  - Carcinoma involving dermal lymphatics.  - See comment.   B. BREAST, LEFT, PUNCH BIOPSY:  - Carcinoma involving dermal lymphatics.  - See comment.   COMMENT:  The  morphology is compatible with ductal breast carcinoma.    ADDENDUM:   PROGNOSTIC INDICATOR RESULTS:   Immunohistochemical and morphometric analysis performed manually   The tumor cells are NEGATIVE for Her2 (0%).   Estrogen Receptor:       NEGATIVE, 0%  Progesterone Receptor:   NEGATIVE, 0%    08/13/2020 Breast MRI   IMPRESSION: 1. Interval significant response to  treatment of the diffuse non-mass enhancement involving the UPPER RIGHT breast. There is a solitary residual 5 mm indeterminate mass in the UPPER OUTER QUADRANT at MIDDLE depth with plateau kinetics. 2. Minimal response to treatment of the diffuse non-mass enhancement throughout the LEFT breast. 3. Resolution of enhancement of the skin and nipple-areolar complex of the RIGHT breast. 4. Near complete resolution of enhancement of the skin and nipple-areolar complex of the LEFT breast. Minimal residual nipple-areola complex enhancement persists. 5. The 3 previously identified pathologic RIGHT axillary lymph nodes are now normal in appearance. There is no pathologic lymphadenopathy currently.   08/23/2020 - 01/13/2021 Chemotherapy   Immunotherapy Keytruda q3weeks starting 08/23/20 for 1 year treatment. D/c on 01/13/21 given disease recurrence/progression   10/01/2020 Surgery   MASTECTOMIES WITH RIGHT SENTINEL LYMPH NODE BIOPSIES,RIGHT TARGETED RADIOACTIVE SEED LYMPH NODE, LEFT BREAST MODIFIED RADICAL MASTECTOMY  and RADIOACTIVE SEED GUIDED  TARGETED RIGHT SENTINEL LYMPH NODE BIOPSY by Dr [name]     10/01/2020 Pathology Results   FINAL MICROSCOPIC DIAGNOSIS:   A. BREAST, LEFT, MASTECTOMY:  - Invasive ductal carcinoma, 7 cm.  - Anterior and posterior soft tissue margins not involved by tumor.  - Foci of skin involvement by tumor, see comment.  - Nipple involvement by tumor.  - See oncology table and comment.   B. LYMPH NODE, LEFT AXILLARY, CONTENTS:  - One lymph node with no metastatic carcinoma (0/1).   C. BREAST, RIGHT, MASTECTOMY:  - Invasive ductal carcinoma, greater than 5 cm.  - Margins not involved.  - Subareolar breast tissue involved by tumor.  - See oncology table and comment.   D. LYMPH NODE, RIGHT #2, SENTINEL, EXCISION:  - Metastatic carcinoma in one lymph node, 0.5 cm (1/1).   E. LYMPH NODE, RIGHT #1, SENTINEL, EXCISION:  - Metastatic carcinoma in one lymph node,  1.5 cm (1/1).  - Biopsy site and biopsy clip.   F. LYMPH NODE, RIGHT #3, SENTINEL, EXCISION:  - Metastatic carcinoma in one lymph node, 1.5 cm (1/1).   G. LYMPH NODE, RIGHT #4, SENTINEL, EXCISION:  - One lymph node with no metastatic carcinoma (0/1).   H. LYMPH NODE, RIGHT #5, SENTINEL, EXCISION:  - Metastatic carcinoma in one lymph node, 0.5 cm (1/1).   I. LYMPH NODE, RIGHT #6, SENTINEL, EXCISION:  - One lymph node with no metastatic carcinoma (0/1).     A.  LEFT BREAST PROGNOSTIC INDICATOR RESULTS:   Immunohistochemical and morphometric analysis performed manually   The tumor cells are NEGATIVE for Her2 (0).   Estrogen Receptor:       0%, Negative  Progesterone Receptor:   0%, Negative  Proliferation Marker Ki-67:   80%   Comment: The negative hormone receptor study(ies) in the case has an  internal positive control.   Reference Range Estrogen and Progesterone Receptor       Negative  0%       Positive  >1%   All controls stained appropriately.   C. RIGHT BREAST PROGNOSTIC INDICATOR RESULTS:   Immunohistochemical and morphometric analysis performed manually   The tumor cells are NEGATIVE for Her2 (0).  Estrogen Receptor:       0%, Negative  Progesterone Receptor:   0%, Negative  Proliferation Marker Ki-67:   90%   10/01/2020 Cancer Staging   Staging form: Breast, AJCC 8th Edition - Pathologic stage from 10/01/2020: Stage IIIC (pT4b, pN0, cM0, G3, ER-, PR-, HER2-) - Signed by Alla Feeling, NP on 10/24/2020   10/14/2020 Surgery   RIGHT AXILLARY LYMPH NODE DISSECTION by Dr [name]  -1 /13 positive LN for metastatic carcinoma    11/18/2020 - 01/20/2021 Chemotherapy   Adjuvant Xeloda 1500 mg BID days 1-14 q21 days x6 months, starting 11/18/2020 (will reduce to 1500 mg Am/1000 mg Pm M-F during radiation). Stopped 01/20/21 due to liver mets/disease recurrence   12/10/2020 - 01/24/2021 Radiation Therapy   Adjuvant Radiation to right chest wall and axilla with Dr Lisbeth Renshaw  on 12/10/20-01/24/21   12/16/2020 Tumor Marker   CA 27.29 at 582.2 on 11/14/20   Ca 27.29 at 1387.9 on 12/16/20   01/13/2021 Progression   PET IMPRESSION: 1. Interval development of bulky hypermetabolic liver metastases involving both hepatic lobes. 2. Hypermetabolic lesion in the posterior left hemithorax. This is obscured by small pleural effusion on CT imaging and could be a pulmonary nodule or pleural nodule. Given superimposition of the pleural fluid, pleural nodule is favored. 3. Tiny subtle soft tissue nodule anterior paramidline left hemithorax with low level FDG uptake raises concern for a second site of pleural disease in the left chest. 4.  Aortic Atherosclerois (ICD10-170.0)   01/15/2021 Pathology Results   FINAL MICROSCOPIC DIAGNOSIS:   A. LIVER, RIGHT LOBE, NEEDLE CORE BIOPSY:  - Metastatic carcinoma to liver, consistent with patient's clinical  history of primary breast carcinoma.  See comment    PROGNOSTIC INDICATOR RESULTS:   Immunohistochemical and morphometric analysis performed manually   The tumor cells are NEGATIVE for Her2 (1+).   Estrogen Receptor:       POSITIVE, 20%, WEAK STAINING  Progesterone Receptor:   NEGATIVE  Proliferation Marker Ki-67:   30%    01/27/2021 - 03/03/2021 Chemotherapy   First-line IV Trodelvy 2 weeks on/1 week off starting 01/27/21. After first dose she developed moderate pancytopenia. Her thrombocytopenia did not improve off treatment. With second dose on 02/12/21, will reduce Trodelvy dose by 50% and change to every 2 weeks.  --D/c 03/03/21 due to worsened liver function and progressed liver mets.     Genetic Testing   Negative genetic testing. No pathogenic variants identified on the [facility] CustomNext+RNA panel. The report date is 01/29/2021.   The CustomNext-Cancer + RNAinsight panel  includes sequencing and/or deletion duplication testing of the following 47 genes: APC, ATM, AXIN2, BARD1, BMPR1A, BRCA1, BRCA2, BRIP1, CDH1, CDK4, CDKN2A,  CHEK2, DICER1, MEN1, MLH1, MSH2, MSH3, MSH6, MUTYH, NBN, NF1, NF2, NTHL1, PALB2, PMS2, PTEN, RAD51C, RAD51D, RECQL, RET, SDHA, SDHAF2, SDHB, SDHC, SDHD, SMAD4, SMARCA4, STK11, TP53, TSC1, TSC2 and VHL (sequencing and deletion/duplication); HOXB13, POLD1 and POLE (sequencing only); EPCAM and GREM1 (deletion/duplication only). RNA data is routinely analyzed for use in variant interpretation for all genes.   02/26/2021 Imaging   US liver  IMPRESSION: 1. Numerous liver lesions are identified compatible with diffuse liver metastases. 2. Gallbladder wall thickness is upper limits of normal at 3 mm. This is nonspecific and may reflect incomplete distention.     03/05/2021 -  Chemotherapy    Patient is on Treatment Plan: BREAST CARBOPLATIN / GEMCITABINE D1,8 Q21D       Chemotherapy   Second-line Carboplatin and Gemcitabine 2 weeks  on/1 weeks off starting this week       CURRENT THERAPY:  Second-line Carboplatin and Gemcitabine 2 weeks on/1 weeks off starting 03/05/21.  INTERVAL HISTORY:  Victoria Coffey is here for a follow up. She was last seen by me 03/03/21. She presents to the clinic with her husband. She notes with start of next line treatment, she is stable with fatigue and able to eat 3 small meals with 4 ensure to maintain her weight. She notes her dark urine is intermittent. Today her urine was clear this morning but right before her visit her urine had darkened. She notes she tries to also drink enough water. She is not doing much housework overall. She notes she feels like she is getting enough sleep at night. Her family does not want her to do much at home or go outside alone much. She feels her balance is mostly stable. She denies pain or nausea.  She notes she had jaundice in 1993 during her first breast cancer. She notes this yellowing was in her hands. She was able to continue chemo AC with jaundice at the time.     REVIEW OF SYSTEMS:   Constitutional: Denies fevers, chills or abnormal  weight loss (+) Fatigue  Eyes: Denies blurriness of vision Ears, nose, mouth, throat, and face: Denies mucositis or sore throat Respiratory: Denies cough, dyspnea or wheezes Cardiovascular: Denies palpitation, chest discomfort or lower extremity swelling Gastrointestinal:  Denies nausea, heartburn or change in bowel habits Skin: Denies abnormal skin rashes Lymphatics: Denies new lymphadenopathy or easy bruising Neurological:Denies numbness, tingling or new weaknesses Behavioral/Psych: Mood is stable, no new changes  All other systems were reviewed with the patient and are negative.  MEDICAL HISTORY:  Past Medical History:  Diagnosis Date  . Aortic atherosclerosis ([place])   . Breast cancer ([place]) dx'd 1993   left  . Fatty liver   . History of left breast cancer   . HIV infection ([place])   . Hypertension   . Port-A-Cath in place     SURGICAL HISTORY: Past Surgical History:  Procedure Laterality Date  . AXILLARY LYMPH NODE DISSECTION Right 10/14/2020   Procedure: left axilla removal of two drains right axillary lymph node dissection;  Surgeon: [name], MD;  Location: [facility];  Service: General;  Laterality: Right;  PEC BLOCK, RNFA OR PA  . BREAST BIOPSY Bilateral 03/22/2020   Procedure: BILATERAL BREAST PUNCH BIOPSIES;  Surgeon: [name], MD;  Location: [facility];  Service: General;  Laterality: Bilateral;  . BREAST LUMPECTOMY    . CESAREAN SECTION    . left breast lumpectomy  1993  . MASTECTOMY W/ SENTINEL NODE BIOPSY Bilateral 10/01/2020  . MASTECTOMY W/ SENTINEL NODE BIOPSY Bilateral 10/01/2020   Procedure: BILATERAL MASTECTOMIES WITH RIGHT SENTINEL LYMPH NODE BIOPSIES,RIGHT TARGETED RADIOACTIVE SEED LYMPH NODE, LEFT BREAST MODIFIED RADICAL MASTECTOMY ;  Surgeon: [name], MD;  Location: [facility];  Service: General;  Laterality: Bilateral;  PEC BLOCK, RNFA  . PORTACATH PLACEMENT Right 03/22/2020   Procedure: INSERTION PORT-A-CATH WITH ULTRASOUND GUIDANCE;  Surgeon: [name], MD;  Location: [facility];  Service: General;  Laterality: Right;  . RADIOACTIVE SEED GUIDED AXILLARY SENTINEL LYMPH NODE Right 10/01/2020   Procedure: RADIOACTIVE SEED GUIDED  TARGETED RIGHT SENTINEL LYMPH NODE BIOPSY;  Surgeon: [name], MD;  Location: [facility];  Service: General;  Laterality: Right;  PEC BLOCK, RNFA    I have reviewed the social history and family history with the patient and they are unchanged  from previous note.  ALLERGIES:  is allergic to codeine, other, grapefruit bioflavonoid complex, pomegranate [punica], and shellfish allergy.  MEDICATIONS:  Current Outpatient Medications  Medication Sig Dispense Refill  . acetaminophen (TYLENOL) 500 MG tablet Take 1,000 mg by mouth every 6 (six) hours as needed for moderate pain or headache.     . eltrombopag (PROMACTA) 50 MG tablet Take 1 tablet (50 mg total) by mouth daily. Take on an empty stomach 1 hour before a meal or 2 hours after 30 tablet 2  . emtricitabine-rilpivir-tenofovir AF (ODEFSEY) 200-25-25 MG TABS tablet Take 1 tablet by mouth daily with breakfast. 30 tablet 11  . hydrochlorothiazide (HYDRODIURIL) 25 MG tablet TAKE 1 TABLET(25 MG) BY MOUTH DAILY (Patient taking differently: Take 25 mg by mouth daily.) 30 tablet 5  . lidocaine-prilocaine (EMLA) cream Apply 1 application topically See admin instructions. Apply 1-2 hours prior to access    . loratadine (CLARITIN) 10 MG tablet Take 10 mg by mouth See admin instructions. Takes 3-4 days before and after treatment    . ondansetron (ZOFRAN) 8 MG tablet Take 1 tablet (8 mg total) by mouth every 8 (eight) hours as needed for nausea or vomiting. 20 tablet 0  . potassium chloride SA (KLOR-CON) 20 MEQ tablet Take 1 tablet (20 mEq total) by mouth daily. 30 tablet 0  . prochlorperazine (COMPAZINE) 10 MG tablet Take 1 tablet (10 mg total) by mouth every 6 (six) hours as needed (Nausea or vomiting). 30 tablet 2  . traMADol (ULTRAM) 50 MG tablet Take 1 tablet (50 mg total) by  mouth every 6 (six) hours as needed (mild pain). 30 tablet 1   No current facility-administered medications for this visit.    PHYSICAL EXAMINATION: ECOG PERFORMANCE STATUS: 2 - Symptomatic, <50% confined to bed  Vitals:   03/10/21 1215  BP: (!) 139/99  Pulse: (!) 118  Resp: 16  Temp: 97.8 F (36.6 C)  SpO2: 100%   Filed Weights   03/10/21 1215  Weight: 122 lb 14.4 oz (55.7 kg)    GENERAL:alert, no distress and comfortable (+) Slower to response  SKIN: skin color, texture, turgor are normal, no rashes or significant lesions EYES: normal, Conjunctiva are pink and non-injected (+) Increased jaundice of eye   NECK: supple, thyroid normal size, non-tender, without nodularity LYMPH:  no palpable lymphadenopathy in the cervical, axillary  LUNGS: clear to auscultation and percussion with normal breathing effort HEART: regular rate & rhythm and no murmurs and no lower extremity edema ABDOMEN:abdomen soft, non-tender and normal bowel sounds Musculoskeletal:no cyanosis of digits and no clubbing  NEURO: alert & oriented x 3 with fluent speech, no focal motor/sensory deficits  LABORATORY DATA:  I have reviewed the data as listed CBC Latest Ref Rng & Units 03/10/2021 03/03/2021 02/26/2021  WBC 4.0 - 10.5 K/uL 6.4 9.2 9.7  Hemoglobin 12.0 - 15.0 g/dL 9.1(L) 9.9(L) 9.6(L)  Hematocrit 36.0 - 46.0 % 26.6(L) 29.5(L) 29.2(L)  Platelets 150 - 400 K/uL 31(L) 73(L) 73(L)     CMP Latest Ref Rng & Units 03/10/2021 03/03/2021 02/26/2021  Glucose 70 - 99 mg/dL 159(H) 149(H) 107(H)  BUN 8 - 23 mg/dL 38(H) 19 14  Creatinine 0.44 - 1.00 mg/dL 0.97 0.85 0.82  Sodium 135 - 145 mmol/L 140 132(L) 131(L)  Potassium 3.5 - 5.1 mmol/L 3.9 3.8 4.1  Chloride 98 - 111 mmol/L 102 99 101  CO2 22 - 32 mmol/L 20(L) 22 18(L)  Calcium 8.9 - 10.3 mg/dL 8.8(L) 8.5(L) 8.5(L)  Total Protein  6.5 - 8.1 g/dL 6.1(L) 6.0(L) 6.0(L)  Total Bilirubin 0.3 - 1.2 mg/dL 12.4(HH) 5.9(HH) 4.8(HH)  Alkaline Phos 38 - 126 U/L 2,329(H)  2,512(H) 2,308(H)  AST 15 - 41 U/L 304(HH) 353(HH) 295(HH)  ALT 0 - 44 U/L 117(H) 71(H) 90(H)      RADIOGRAPHIC STUDIES: I have personally reviewed the radiological images as listed and agreed with the findings in the report. No results found.   ASSESSMENT & PLAN:  ROQUEL BURGIN is a 65 y.o. female with   1.InflammatoryLeft breast cancer,pT4N0Mx,ypT4N0,triple negativeAND Right breast cancer withaxillary node metastasis,pT3N2aM0,ERweakly+,PR/HER2-,ypT3N2a, triple negative,GradeIII, diffuse liver metastasis(ER weakly+/PR-/HER-)in 12/2020 -She was diagnosed with left breast cancer in 02/2020.She presented with inflammatory left breast cancer. Biopsy of left breast mass and right axillary LN were positive for Invasive ductal carcinoma.Further work up showed 9cm nodular area in right breast and skin biopsy confirmed cancer involvement.Staging scansin May 2021were negative for distant metastasis. -She completed AC X4 and weekly TC 03/22/20-08/23/20. She underwent b/l mastectomy with Dr [name] on 10/01/20.Shealsounderwent right axillary lymph node dissection on 10/14/2020 and completed Adjuvant radiation with Dr Moody2/8/22-3/25/22. -Given recentpublish [employer] 522 trial data,I started her on Immunotherapy Keytruda q3weeks for 1 year of treatment beginning 08/23/20.Due tohersignificantresidual diseasein LNs,Istarted her onadjuvant Xelodafor 6 months on 11/18/20. Unfortunately she progressed/recurred with diffuse liver mets on 01/13/21 PET and 01/15/21 Liver biopsy (Her/PR negative/ER 20% weakly positive).Keytruda/Xeloda d/c.  -I have requested [facility] one on her biopsy sample,results still pending.  -Istarted her on first-line IV Trodelvy 2 weeks on/1 week offbeginning 01/27/21.After first dose she developed moderate pancytopenia. Her thrombocytopenia did not improve off treatmentso I reducedTrodelvy dose by 50% and change to every 2 weeks starting 02/12/21  (second dose). -Since she started chemotherapy(Trodelvy), LFTs slightly improved. Unfortunately s/p 2 doses, her LFTs and tbili significantly worsened. Work up with liver US showed diffuse liver mets but no CBD obstruction as etiology. This is from her liver mets. The only way to improve her jaundice and liver function is to treat her cancer. However, with decreased liver function, her treatment options are very limited.  -I started her on second-line Carboplatin and Gemcitabine 2 weeks on/1 weeks off starting 03/05/21.  -She tolerated first dose well with stable fatigue and able to maintain weight. Labs show, Hg 9.1, plt 31K. With this drop in platelets. I recommend postponing second dose to 5/16, if labs improve. I discussed we amy change regimen to every 2 weeks if better tolerable.  -If she is not able to tolerate treatment I discussed, stopping chemo altogether given risk outweigh the benefit.  -F/u next week 5/16   2.Transaminitis, hyperbilirubinemia, Jaundice, secondary toliver metastasis -Since she started chemotherapy(Trodelvy), LFTs slightly improved.  -However, 02/26/21 labs show AST 295, ALT 90, Alk Phos 2308, Tbili 4.8. She has had dark urine since 3 days before. She also has hepatomegaly and mild jaundice of eyes on exam (02/26/21).  -Her 02/26/21 US showed known liver mets and Gallbladder wall thickness is upper limits of normal at 3 mm. No obvious CBD obstruction.  -In the past week, she continues to have clinical jaundice and her hepatomegaly has increased on exam (03/03/21). Her labs today show, AST 353, ALT 71, Alk Phos 2512, Tbili 5.9.  -Will d/c Trodelvy. Her future treatment options are limited given her abnormal liver function -Her jaundice is mostly clear at home (with urine and eyes), but becomes more jaundice when in clinic. I will call with LFT results from today (03/10/21)  3.Cytopenias, Moderate Thrombocytopenia -secondary to chemo(Trodelvy) -  Mild and stableanemia  and intermittent leukopenia.Continue monitoring labs weekly -After C1D1 Trodelvy her blood counts dropped.After being off Trodelvy for 2 weeks,labs improved,but her plt remain low. Dose reduced to every 2 weeks form 02/12/21.Will only giveGCSFas needed. -Her Plt remain in low ranges. I started her on Promacta 67m daily (02/12/21). If not enough, will given NPlate. -With start of second line Gem/Carbo, her plt did decrease further at 31K (03/10/21). I recommend increase Promacta to 517mBID.   4. H/o Left breast cancer, Dx in 1993s/p lumpectomy, radiation, chemo, Tamoxifen.Genetic Testingfrom 01/14/21 wasnegative for pathogenetic mutations  5. Comorbidities: HIV(+)Dx 1993, HTN -Her HIV has been undetectable and well controlled. She is currently being seen by ID Dr SnGraylon GoodIpreviouslyreferredher for Evusheld injection for COVID prevention  6. Goal of care discussion  -We previously discussed the incurable nature of her cancer, and the overall garded prognosis, due to the rapid disease progression, and worsening liver function -The patient understands the goal of care is palliative. -I recommend DNR/DNI, patient is agreeable, however her husband is not ready for that decision.  We will give patient and her family to process and discuss  -I discussed with decreased liver function and increased disease burden, she will likely become more symptomatic and may require more medical help at home. I recommend Palliative home care for this extra medical help. She will think about it.    PLAN: -Increase Promacta to 10052maily  -Labs reviewed with worsened thrombocytopenia. No chemo this week.  -Lab, flush, F/u and Carbo/Gem on 5/16   No problem-specific Assessment & Plan notes found for this encounter.   No orders of the defined types were placed in this encounter.  All questions were answered. The patient knows to call the clinic with any problems, questions or concerns. No barriers  to learning was detected. The total time spent in the appointment was 30 minutes.     YanTruitt MerleD 03/10/2021   I, AmoJoslyn Devonm acting as scribe for YanTruitt MerleD.   I have reviewed the above documentation for accuracy and completeness, and I agree with the above.

## 2021-03-10 ENCOUNTER — Encounter: Payer: Self-pay | Admitting: Hematology

## 2021-03-10 ENCOUNTER — Other Ambulatory Visit: Payer: Self-pay

## 2021-03-10 ENCOUNTER — Inpatient Hospital Stay: Payer: BC Managed Care – PPO

## 2021-03-10 ENCOUNTER — Inpatient Hospital Stay (HOSPITAL_BASED_OUTPATIENT_CLINIC_OR_DEPARTMENT_OTHER): Payer: BC Managed Care – PPO | Admitting: Hematology

## 2021-03-10 VITALS — BP 139/99 | HR 118 | Temp 97.8°F | Resp 16 | Ht 60.0 in | Wt 122.9 lb

## 2021-03-10 DIAGNOSIS — C50412 Malignant neoplasm of upper-outer quadrant of left female breast: Secondary | ICD-10-CM

## 2021-03-10 DIAGNOSIS — Z170 Estrogen receptor positive status [ER+]: Secondary | ICD-10-CM

## 2021-03-10 DIAGNOSIS — C50411 Malignant neoplasm of upper-outer quadrant of right female breast: Secondary | ICD-10-CM | POA: Diagnosis not present

## 2021-03-10 DIAGNOSIS — C787 Secondary malignant neoplasm of liver and intrahepatic bile duct: Secondary | ICD-10-CM | POA: Diagnosis not present

## 2021-03-10 DIAGNOSIS — Z5111 Encounter for antineoplastic chemotherapy: Secondary | ICD-10-CM | POA: Diagnosis not present

## 2021-03-10 DIAGNOSIS — Z95828 Presence of other vascular implants and grafts: Secondary | ICD-10-CM

## 2021-03-10 LAB — CBC WITH DIFFERENTIAL (CANCER CENTER ONLY)
Abs Immature Granulocytes: 0.05 10*3/uL (ref 0.00–0.07)
Basophils Absolute: 0 10*3/uL (ref 0.0–0.1)
Basophils Relative: 0 %
Eosinophils Absolute: 0 10*3/uL (ref 0.0–0.5)
Eosinophils Relative: 0 %
HCT: 26.6 % — ABNORMAL LOW (ref 36.0–46.0)
Hemoglobin: 9.1 g/dL — ABNORMAL LOW (ref 12.0–15.0)
Immature Granulocytes: 1 %
Lymphocytes Relative: 6 %
Lymphs Abs: 0.4 10*3/uL — ABNORMAL LOW (ref 0.7–4.0)
MCH: 34.9 pg — ABNORMAL HIGH (ref 26.0–34.0)
MCHC: 34.2 g/dL (ref 30.0–36.0)
MCV: 101.9 fL — ABNORMAL HIGH (ref 80.0–100.0)
Monocytes Absolute: 0.2 10*3/uL (ref 0.1–1.0)
Monocytes Relative: 4 %
Neutro Abs: 5.8 10*3/uL (ref 1.7–7.7)
Neutrophils Relative %: 89 %
Platelet Count: 31 10*3/uL — ABNORMAL LOW (ref 150–400)
RBC: 2.61 MIL/uL — ABNORMAL LOW (ref 3.87–5.11)
RDW: 19 % — ABNORMAL HIGH (ref 11.5–15.5)
WBC Count: 6.4 10*3/uL (ref 4.0–10.5)
nRBC: 2 % — ABNORMAL HIGH (ref 0.0–0.2)

## 2021-03-10 LAB — CMP (CANCER CENTER ONLY)
ALT: 117 U/L — ABNORMAL HIGH (ref 0–44)
AST: 304 U/L (ref 15–41)
Albumin: 1.6 g/dL — ABNORMAL LOW (ref 3.5–5.0)
Alkaline Phosphatase: 2329 U/L — ABNORMAL HIGH (ref 38–126)
Anion gap: 18 — ABNORMAL HIGH (ref 5–15)
BUN: 38 mg/dL — ABNORMAL HIGH (ref 8–23)
CO2: 20 mmol/L — ABNORMAL LOW (ref 22–32)
Calcium: 8.8 mg/dL — ABNORMAL LOW (ref 8.9–10.3)
Chloride: 102 mmol/L (ref 98–111)
Creatinine: 0.97 mg/dL (ref 0.44–1.00)
GFR, Estimated: >60 mL/min (ref >60)
Glucose, Bld: 159 mg/dL — ABNORMAL HIGH (ref 70–99)
Potassium: 3.9 mmol/L (ref 3.5–5.1)
Sodium: 140 mmol/L (ref 135–145)
Total Bilirubin: 12.4 mg/dL (ref 0.3–1.2)
Total Protein: 6.1 g/dL — ABNORMAL LOW (ref 6.5–8.1)

## 2021-03-10 MED ORDER — SODIUM CHLORIDE 0.9% FLUSH
10.0000 mL | Freq: Once | INTRAVENOUS | Status: AC
  Administered 2021-03-10: 10 mL
  Filled 2021-03-10: qty 10

## 2021-03-10 MED ORDER — HEPARIN SOD (PORK) LOCK FLUSH 100 UNIT/ML IV SOLN
500.0000 [IU] | Freq: Once | INTRAVENOUS | Status: AC
  Administered 2021-03-10: 500 [IU]
  Filled 2021-03-10: qty 5

## 2021-03-10 MED ORDER — ELTROMBOPAG OLAMINE 50 MG PO TABS: 100.0000 mg | ORAL_TABLET | Freq: Every day | ORAL | 2 refills | Status: DC

## 2021-03-10 NOTE — Progress Notes (Signed)
Critical Value: T. Bili 12.4 AST 304 Dr Burr Medico notified

## 2021-03-11 ENCOUNTER — Other Ambulatory Visit: Payer: Self-pay | Admitting: Hematology

## 2021-03-12 ENCOUNTER — Ambulatory Visit: Payer: BC Managed Care – PPO

## 2021-03-12 ENCOUNTER — Telehealth: Payer: Self-pay | Admitting: Hematology

## 2021-03-12 ENCOUNTER — Telehealth: Payer: Self-pay

## 2021-03-12 NOTE — Telephone Encounter (Signed)
Scheduled appt per 5/11 sch msg. Pt aware.  

## 2021-03-12 NOTE — Telephone Encounter (Signed)
Reviewed with Victoria Coffey need to stop promacta and start nplate.  I told her our scheduling dept will cal her with injection appt for nplate.  She verbalized understanding.

## 2021-03-14 ENCOUNTER — Emergency Department (HOSPITAL_COMMUNITY): Payer: BC Managed Care – PPO

## 2021-03-14 ENCOUNTER — Inpatient Hospital Stay (HOSPITAL_COMMUNITY)
Admission: EM | Admit: 2021-03-14 | Discharge: 2021-04-02 | DRG: 871 | Disposition: E | Payer: BC Managed Care – PPO | Attending: Internal Medicine | Admitting: Internal Medicine

## 2021-03-14 ENCOUNTER — Telehealth: Payer: Self-pay | Admitting: Hematology

## 2021-03-14 ENCOUNTER — Inpatient Hospital Stay: Payer: BC Managed Care – PPO

## 2021-03-14 DIAGNOSIS — R404 Transient alteration of awareness: Secondary | ICD-10-CM | POA: Diagnosis present

## 2021-03-14 DIAGNOSIS — N179 Acute kidney failure, unspecified: Secondary | ICD-10-CM | POA: Diagnosis present

## 2021-03-14 DIAGNOSIS — Z171 Estrogen receptor negative status [ER-]: Secondary | ICD-10-CM

## 2021-03-14 DIAGNOSIS — R6521 Severe sepsis with septic shock: Secondary | ICD-10-CM

## 2021-03-14 DIAGNOSIS — C50919 Malignant neoplasm of unspecified site of unspecified female breast: Secondary | ICD-10-CM | POA: Diagnosis not present

## 2021-03-14 DIAGNOSIS — E875 Hyperkalemia: Secondary | ICD-10-CM

## 2021-03-14 DIAGNOSIS — Z515 Encounter for palliative care: Secondary | ICD-10-CM

## 2021-03-14 DIAGNOSIS — Z9109 Other allergy status, other than to drugs and biological substances: Secondary | ICD-10-CM

## 2021-03-14 DIAGNOSIS — E872 Acidosis: Secondary | ICD-10-CM | POA: Diagnosis present

## 2021-03-14 DIAGNOSIS — N19 Unspecified kidney failure: Secondary | ICD-10-CM | POA: Diagnosis present

## 2021-03-14 DIAGNOSIS — E871 Hypo-osmolality and hyponatremia: Secondary | ICD-10-CM | POA: Diagnosis present

## 2021-03-14 DIAGNOSIS — C787 Secondary malignant neoplasm of liver and intrahepatic bile duct: Secondary | ICD-10-CM | POA: Diagnosis present

## 2021-03-14 DIAGNOSIS — K529 Noninfective gastroenteritis and colitis, unspecified: Secondary | ICD-10-CM | POA: Diagnosis present

## 2021-03-14 DIAGNOSIS — C50412 Malignant neoplasm of upper-outer quadrant of left female breast: Secondary | ICD-10-CM | POA: Diagnosis present

## 2021-03-14 DIAGNOSIS — K7200 Acute and subacute hepatic failure without coma: Secondary | ICD-10-CM | POA: Diagnosis present

## 2021-03-14 DIAGNOSIS — J690 Pneumonitis due to inhalation of food and vomit: Secondary | ICD-10-CM | POA: Diagnosis present

## 2021-03-14 DIAGNOSIS — D6481 Anemia due to antineoplastic chemotherapy: Secondary | ICD-10-CM | POA: Diagnosis present

## 2021-03-14 DIAGNOSIS — K7201 Acute and subacute hepatic failure with coma: Secondary | ICD-10-CM | POA: Diagnosis not present

## 2021-03-14 DIAGNOSIS — Z95828 Presence of other vascular implants and grafts: Secondary | ICD-10-CM

## 2021-03-14 DIAGNOSIS — Z20822 Contact with and (suspected) exposure to covid-19: Secondary | ICD-10-CM | POA: Diagnosis present

## 2021-03-14 DIAGNOSIS — I10 Essential (primary) hypertension: Secondary | ICD-10-CM | POA: Diagnosis present

## 2021-03-14 DIAGNOSIS — D6959 Other secondary thrombocytopenia: Secondary | ICD-10-CM | POA: Diagnosis present

## 2021-03-14 DIAGNOSIS — Z7189 Other specified counseling: Secondary | ICD-10-CM | POA: Diagnosis not present

## 2021-03-14 DIAGNOSIS — Z9911 Dependence on respirator [ventilator] status: Secondary | ICD-10-CM

## 2021-03-14 DIAGNOSIS — I469 Cardiac arrest, cause unspecified: Secondary | ICD-10-CM | POA: Diagnosis present

## 2021-03-14 DIAGNOSIS — A419 Sepsis, unspecified organism: Principal | ICD-10-CM

## 2021-03-14 DIAGNOSIS — R55 Syncope and collapse: Secondary | ICD-10-CM | POA: Diagnosis present

## 2021-03-14 DIAGNOSIS — R109 Unspecified abdominal pain: Secondary | ICD-10-CM

## 2021-03-14 DIAGNOSIS — Z923 Personal history of irradiation: Secondary | ICD-10-CM | POA: Diagnosis not present

## 2021-03-14 DIAGNOSIS — J9601 Acute respiratory failure with hypoxia: Secondary | ICD-10-CM | POA: Diagnosis present

## 2021-03-14 DIAGNOSIS — Z66 Do not resuscitate: Secondary | ICD-10-CM | POA: Diagnosis present

## 2021-03-14 DIAGNOSIS — Z170 Estrogen receptor positive status [ER+]: Secondary | ICD-10-CM

## 2021-03-14 DIAGNOSIS — Z79899 Other long term (current) drug therapy: Secondary | ICD-10-CM

## 2021-03-14 DIAGNOSIS — I468 Cardiac arrest due to other underlying condition: Secondary | ICD-10-CM | POA: Diagnosis present

## 2021-03-14 DIAGNOSIS — Z9221 Personal history of antineoplastic chemotherapy: Secondary | ICD-10-CM | POA: Diagnosis not present

## 2021-03-14 DIAGNOSIS — R7989 Other specified abnormal findings of blood chemistry: Secondary | ICD-10-CM

## 2021-03-14 DIAGNOSIS — I700 Atherosclerosis of aorta: Secondary | ICD-10-CM | POA: Diagnosis present

## 2021-03-14 DIAGNOSIS — K760 Fatty (change of) liver, not elsewhere classified: Secondary | ICD-10-CM | POA: Diagnosis present

## 2021-03-14 DIAGNOSIS — T451X5A Adverse effect of antineoplastic and immunosuppressive drugs, initial encounter: Secondary | ICD-10-CM | POA: Diagnosis present

## 2021-03-14 DIAGNOSIS — Z21 Asymptomatic human immunodeficiency virus [HIV] infection status: Secondary | ICD-10-CM | POA: Diagnosis present

## 2021-03-14 LAB — I-STAT ARTERIAL BLOOD GAS, ED
Acid-base deficit: 10 mmol/L — ABNORMAL HIGH (ref 0.0–2.0)
Bicarbonate: 17.3 mmol/L — ABNORMAL LOW (ref 20.0–28.0)
Calcium, Ion: 0.92 mmol/L — ABNORMAL LOW (ref 1.15–1.40)
HCT: 26 % — ABNORMAL LOW (ref 36.0–46.0)
Hemoglobin: 8.8 g/dL — ABNORMAL LOW (ref 12.0–15.0)
O2 Saturation: 100 %
Patient temperature: 95.8
Potassium: 4.8 mmol/L (ref 3.5–5.1)
Sodium: 124 mmol/L — ABNORMAL LOW (ref 135–145)
TCO2: 18 mmol/L — ABNORMAL LOW (ref 22–32)
pCO2 arterial: 38.3 mmHg (ref 32.0–48.0)
pH, Arterial: 7.253 — ABNORMAL LOW (ref 7.350–7.450)
pO2, Arterial: 387 mmHg — ABNORMAL HIGH (ref 83.0–108.0)

## 2021-03-14 LAB — COMPREHENSIVE METABOLIC PANEL
ALT: 318 U/L — ABNORMAL HIGH (ref 0–44)
AST: 1439 U/L — ABNORMAL HIGH (ref 15–41)
Albumin: 1.3 g/dL — ABNORMAL LOW (ref 3.5–5.0)
Alkaline Phosphatase: 2160 U/L — ABNORMAL HIGH (ref 38–126)
Anion gap: 21 — ABNORMAL HIGH (ref 5–15)
BUN: 57 mg/dL — ABNORMAL HIGH (ref 8–23)
CO2: 9 mmol/L — ABNORMAL LOW (ref 22–32)
Calcium: 7.7 mg/dL — ABNORMAL LOW (ref 8.9–10.3)
Chloride: 88 mmol/L — ABNORMAL LOW (ref 98–111)
Creatinine, Ser: 1.72 mg/dL — ABNORMAL HIGH (ref 0.44–1.00)
GFR, Estimated: 33 mL/min — ABNORMAL LOW (ref >60)
Glucose, Bld: 156 mg/dL — ABNORMAL HIGH (ref 70–99)
Potassium: 6.4 mmol/L (ref 3.5–5.1)
Sodium: 118 mmol/L — CL (ref 135–145)
Total Bilirubin: 12 mg/dL — ABNORMAL HIGH (ref 0.3–1.2)
Total Protein: 4.7 g/dL — ABNORMAL LOW (ref 6.5–8.1)

## 2021-03-14 LAB — CBC WITH DIFFERENTIAL/PLATELET
Abs Immature Granulocytes: 0.24 10*3/uL — ABNORMAL HIGH (ref 0.00–0.07)
Basophils Absolute: 0 10*3/uL (ref 0.0–0.1)
Basophils Relative: 0 %
Eosinophils Absolute: 0 10*3/uL (ref 0.0–0.5)
Eosinophils Relative: 0 %
HCT: 25.5 % — ABNORMAL LOW (ref 36.0–46.0)
Hemoglobin: 8.3 g/dL — ABNORMAL LOW (ref 12.0–15.0)
Immature Granulocytes: 2 %
Lymphocytes Relative: 6 %
Lymphs Abs: 0.7 10*3/uL (ref 0.7–4.0)
MCH: 35.6 pg — ABNORMAL HIGH (ref 26.0–34.0)
MCHC: 32.5 g/dL (ref 30.0–36.0)
MCV: 109.4 fL — ABNORMAL HIGH (ref 80.0–100.0)
Monocytes Absolute: 0.6 10*3/uL (ref 0.1–1.0)
Monocytes Relative: 5 %
Neutro Abs: 10.5 10*3/uL — ABNORMAL HIGH (ref 1.7–7.7)
Neutrophils Relative %: 87 %
Platelets: UNDETERMINED 10*3/uL (ref 150–400)
RBC: 2.33 MIL/uL — ABNORMAL LOW (ref 3.87–5.11)
RDW: 20.1 % — ABNORMAL HIGH (ref 11.5–15.5)
WBC: 12.1 10*3/uL — ABNORMAL HIGH (ref 4.0–10.5)
nRBC: 24.6 % — ABNORMAL HIGH (ref 0.0–0.2)

## 2021-03-14 LAB — TROPONIN I (HIGH SENSITIVITY)
Troponin I (High Sensitivity): 139 ng/L (ref <18)
Troponin I (High Sensitivity): 199 ng/L (ref <18)

## 2021-03-14 LAB — I-STAT CHEM 8, ED
BUN: 65 mg/dL — ABNORMAL HIGH (ref 8–23)
Calcium, Ion: 0.94 mmol/L — ABNORMAL LOW (ref 1.15–1.40)
Chloride: 94 mmol/L — ABNORMAL LOW (ref 98–111)
Creatinine, Ser: 1.6 mg/dL — ABNORMAL HIGH (ref 0.44–1.00)
Glucose, Bld: 143 mg/dL — ABNORMAL HIGH (ref 70–99)
HCT: 27 % — ABNORMAL LOW (ref 36.0–46.0)
Hemoglobin: 9.2 g/dL — ABNORMAL LOW (ref 12.0–15.0)
Potassium: 6.3 mmol/L (ref 3.5–5.1)
Sodium: 117 mmol/L — CL (ref 135–145)
TCO2: 13 mmol/L — ABNORMAL LOW (ref 22–32)

## 2021-03-14 LAB — LACTIC ACID, PLASMA: Lactic Acid, Venous: 10.8 mmol/L (ref 0.5–1.9)

## 2021-03-14 LAB — GLUCOSE, CAPILLARY: Glucose-Capillary: 155 mg/dL — ABNORMAL HIGH (ref 70–99)

## 2021-03-14 LAB — HEMOGLOBIN A1C
Hgb A1c MFr Bld: 4.6 % — ABNORMAL LOW (ref 4.8–5.6)
Mean Plasma Glucose: 85.32 mg/dL

## 2021-03-14 LAB — PROTIME-INR
INR: 4.2 (ref 0.8–1.2)
Prothrombin Time: 40.4 seconds — ABNORMAL HIGH (ref 11.4–15.2)

## 2021-03-14 LAB — APTT: aPTT: 96 seconds — ABNORMAL HIGH (ref 24–36)

## 2021-03-14 LAB — AMMONIA: Ammonia: 106 umol/L — ABNORMAL HIGH (ref 9–35)

## 2021-03-14 MED ORDER — NOREPINEPHRINE 4 MG/250ML-% IV SOLN
INTRAVENOUS | Status: AC
  Administered 2021-03-14: 8 ug/min via INTRAVENOUS
  Filled 2021-03-14: qty 250

## 2021-03-14 MED ORDER — ROCURONIUM BROMIDE 50 MG/5ML IV SOLN
INTRAVENOUS | Status: AC | PRN
  Administered 2021-03-14: 100 mg via INTRAVENOUS

## 2021-03-14 MED ORDER — IPRATROPIUM-ALBUTEROL 0.5-2.5 (3) MG/3ML IN SOLN
3.0000 mL | Freq: Four times a day (QID) | RESPIRATORY_TRACT | Status: DC
  Administered 2021-03-15 (×2): 3 mL via RESPIRATORY_TRACT
  Filled 2021-03-14 (×2): qty 3

## 2021-03-14 MED ORDER — MIDAZOLAM HCL 2 MG/2ML IJ SOLN: 2.0000 mg | INTRAMUSCULAR | Status: DC | PRN

## 2021-03-14 MED ORDER — ETOMIDATE 2 MG/ML IV SOLN
INTRAVENOUS | Status: AC | PRN
  Administered 2021-03-14: 20 mg via INTRAVENOUS

## 2021-03-14 MED ORDER — MIDAZOLAM HCL 2 MG/2ML IJ SOLN
2.0000 mg | INTRAMUSCULAR | Status: DC | PRN
  Filled 2021-03-14: qty 2

## 2021-03-14 MED ORDER — CALCIUM GLUCONATE-NACL 1-0.675 GM/50ML-% IV SOLN
1.0000 g | Freq: Once | INTRAVENOUS | Status: AC
  Administered 2021-03-14: 1000 mg via INTRAVENOUS
  Filled 2021-03-14: qty 50

## 2021-03-14 MED ORDER — NOREPINEPHRINE 4 MG/250ML-% IV SOLN
INTRAVENOUS | Status: AC
  Administered 2021-03-14: 35 ug/min via INTRAVENOUS
  Filled 2021-03-14: qty 250

## 2021-03-14 MED ORDER — MIDAZOLAM BOLUS VIA INFUSION
1.0000 mg | INTRAVENOUS | Status: DC | PRN
Start: 2021-03-14 — End: 2021-03-14
  Filled 2021-03-14: qty 2

## 2021-03-14 MED ORDER — MIDAZOLAM 50MG/50ML (1MG/ML) PREMIX INFUSION: 0.0000 mg/h | INTRAVENOUS | Status: DC

## 2021-03-14 MED ORDER — FENTANYL 2500MCG IN NS 250ML (10MCG/ML) PREMIX INFUSION
25.0000 ug/h | INTRAVENOUS | Status: DC
  Administered 2021-03-14: 50 ug/h via INTRAVENOUS
  Filled 2021-03-14: qty 250

## 2021-03-14 MED ORDER — NOREPINEPHRINE 4 MG/250ML-% IV SOLN
INTRAVENOUS | Status: AC | PRN
  Administered 2021-03-14: 10 ug/min via INTRAVENOUS

## 2021-03-14 MED ORDER — NOREPINEPHRINE 4 MG/250ML-% IV SOLN
INTRAVENOUS | Status: AC
  Filled 2021-03-14: qty 250

## 2021-03-14 MED ORDER — LACTATED RINGERS IV SOLN: INTRAVENOUS | Status: AC

## 2021-03-14 MED ORDER — INSULIN ASPART 100 UNIT/ML IJ SOLN
0.0000 [IU] | INTRAMUSCULAR | Status: DC
  Administered 2021-03-14: 1 [IU] via SUBCUTANEOUS
  Administered 2021-03-15: 2 [IU] via SUBCUTANEOUS

## 2021-03-14 MED ORDER — SODIUM CHLORIDE 0.9 % IV BOLUS (SEPSIS)
1000.0000 mL | Freq: Once | INTRAVENOUS | Status: AC
  Administered 2021-03-14: 1000 mL via INTRAVENOUS

## 2021-03-14 MED ORDER — PROPOFOL 1000 MG/100ML IV EMUL
INTRAVENOUS | Status: AC
  Filled 2021-03-14: qty 100

## 2021-03-14 MED ORDER — FENTANYL BOLUS VIA INFUSION
50.0000 ug | INTRAVENOUS | Status: DC | PRN
  Filled 2021-03-14: qty 50

## 2021-03-14 MED ORDER — NOREPINEPHRINE 4 MG/250ML-% IV SOLN
0.0000 ug/min | INTRAVENOUS | Status: DC
  Administered 2021-03-15: 40 ug/min via INTRAVENOUS
  Filled 2021-03-14: qty 500

## 2021-03-14 MED ORDER — DOCUSATE SODIUM 100 MG PO CAPS: 100.0000 mg | ORAL_CAPSULE | Freq: Two times a day (BID) | ORAL | Status: DC | PRN

## 2021-03-14 MED ORDER — SODIUM ZIRCONIUM CYCLOSILICATE 10 G PO PACK
10.0000 g | PACK | Freq: Once | ORAL | Status: AC
  Administered 2021-03-14: 10 g
  Filled 2021-03-14: qty 1

## 2021-03-14 MED ORDER — SODIUM BICARBONATE 8.4 % IV SOLN
50.0000 meq | Freq: Once | INTRAVENOUS | Status: AC
  Administered 2021-03-14: 50 meq via INTRAVENOUS
  Filled 2021-03-14: qty 50

## 2021-03-14 MED ORDER — METRONIDAZOLE 500 MG/100ML IV SOLN
500.0000 mg | Freq: Three times a day (TID) | INTRAVENOUS | Status: DC
  Administered 2021-03-14 – 2021-03-15 (×2): 500 mg via INTRAVENOUS
  Filled 2021-03-14 (×2): qty 100

## 2021-03-14 MED ORDER — POLYETHYLENE GLYCOL 3350 17 G PO PACK: 17.0000 g | PACK | Freq: Every day | ORAL | Status: DC | PRN

## 2021-03-14 MED ORDER — LACTATED RINGERS IV SOLN: INTRAVENOUS | Status: DC

## 2021-03-14 MED ORDER — SODIUM BICARBONATE 8.4 % IV SOLN
INTRAVENOUS | Status: AC | PRN
  Administered 2021-03-14: 50 meq via INTRAVENOUS

## 2021-03-14 NOTE — Progress Notes (Incomplete)
Victoria Coffey   [phone] [phone]   Clinic Follow up Note   Patient Care Team: [name], MD as PCP - General (Infectious Diseases) [name], MD as PCP - Infectious Diseases (Infectious Diseases) [name], MD as Consulting Physician (General Surgery) [name], MD as Consulting Physician (Hematology) [name], MD as Consulting Physician (Radiation Oncology) [name], RN as Oncology Nurse Navigator [name], RN as Oncology Nurse Navigator  Date of Service:  03/08/2021  CHIEF COMPLAINT: F/u ofbilateralbreast cancer  SUMMARY OF ONCOLOGIC HISTORY: Oncology History Overview Note  Cancer Staging Bilateral breast cancer [facility]) Staging form: Breast, AJCC 8th Edition - Pathologic stage from 10/01/2020: Stage IIIC (pT3, pN2a, cM0, G3, ER-, PR-, HER2-) - Signed by Alla Feeling, NP on 10/24/2020  Malignant neoplasm of upper-outer quadrant of left breast in female, estrogen receptor positive ([place]) Staging form: Breast, AJCC 8th Edition - Clinical stage from 03/01/2020: Stage IIIC (cT4, cN0, cM0, G3, ER+, PR-, HER2-) - Signed by [name], MD on 08/22/2020 - Pathologic stage from 10/01/2020: Stage IIIC (pT4b, pN0, cM0, G3, ER-, PR-, HER2-) - Signed by Alla Feeling, NP on 10/24/2020    Malignant neoplasm of upper-outer quadrant of left breast in female, estrogen receptor negative ([place])  02/28/2020 Mammogram   Diagnostic Mammogram 02/28/20  IMPRESSION The 3.9 cm ill defined mass in the left breast posterior depth central 4.3cm to the nipple seen on the mediolateral oblique view onlt with associated difssue skin thickening is highly suspicious for malignancy.    03/01/2020 Cancer Staging   Staging form: Breast, AJCC 8th Edition - Clinical stage from 03/01/2020: Stage IIIC (cT4, cN0, cM0, G3, ER+, PR-, HER2-) - Signed by [name], MD on 08/22/2020   03/01/2020 Initial Biopsy   Diagnosis 03/01/20  1. Breast, left,  needle core biopsy, 9 o'clock, 3-4cmfn - INVASIVE DUCTAL CARCINOMA. SEE NOTE 2. Lymph node, needle/core biopsy, right axilla - INVASIVE DUCTAL CARCINOMA. SEE NOTE Diagnosis Note 1. Invasive carcinoma measures 1.5 cm in greatest linear dimension and appears grade 3. Dr. [name] reviewed the case and concurs with the diagnosis. A breast prognostic profile (ER, PR, Ki-67 and HER2) is pending and will be reported in an addendum. Dr. [name] was notified on 03/04/2020. 2. Carcinoma measures 0.6 cm in greatest linear dimension and appears grade 3. Lymphoid tissue is not identified - this may represent a completely replaced lymph node. A breast prognostic profile (ER, PR, Ki-67 and HER2) is pending and will be reported in an addendum. Dr. [name] reviewed the case and concurs with the diagnosis. Dr. [name] was notified on 03/04/2020.   03/01/2020 Receptors her2   1. PROGNOSTIC INDICATORS Results: IMMUNOHISTOCHEMICAL AND MORPHOMETRIC ANALYSIS PERFORMED MANUALLY The tumor cells are NEGATIVE for Her2 (0). Estrogen Receptor: 20%, POSITIVE, WEAK STAINING INTENSITY Progesterone Receptor: 0%, NEGATIVE Proliferation Marker Ki67: 40% COMMENT: The negative hormone receptor study(ies) in this case has an internal positive control.    2. PROGNOSTIC INDICATORS Results: IMMUNOHISTOCHEMICAL AND MORPHOMETRIC ANALYSIS PERFORMED MANUALLY The tumor cells are NEGATIVE for Her2 (1+). Estrogen Receptor: 10%, POSITIVE, WEAK STAINING INTENSITY Progesterone Receptor: 0%, NEGATIVE Proliferation Marker Ki67: 40% COMMENT: The negative hormone receptor study(ies) in this case has no internal positive control.   03/05/2020 Initial Diagnosis   Malignant neoplasm of upper-outer quadrant of left breast in female, estrogen receptor positive ([place])   03/18/2020 Breast MRI   IMPRESSION: 1. 7 x 9 x 6 cm area of suspicious non masslike enhancement within the central/UPPER RIGHT breast  with anterior skin thickening.  Given biopsy-proven metastatic RIGHT axillary lymph node, tissue sampling of the anterior and posterior aspects of this non masslike RIGHT breast enhancement is recommended to exclude malignancy. 2. 7 x 6 x 5 cm diffuse masslike and nonmasslike enhancement within the majority of the remaining LEFT breast with diffuse LEFT breast skin thickening, compatible with malignancy. Abnormal enhancement is noted along the anterior aspect of the LEFT pectoralis muscle. 3. Three abnormal RIGHT axillary lymph nodes, 1 which is biopsy proven to be metastatic disease. No abnormal LEFT axillary lymph nodes identified.   03/20/2020 Echocardiogram   Baseline ECHO  IMPRESSIONS     1. The patient was reported to be in pain during the study and not able  to participate in this study. All that can be said is that the LV and RV  function are grossly normal. Would recommend to repeat this study when/if  the patient is able to tolerate  the exam.   2. Left ventricular ejection fraction, by estimation, is 60 to 65%. The  left ventricle has normal function. Left ventricular endocardial border  not optimally defined to evaluate regional wall motion. Left ventricular  diastolic function could not be  evaluated.   3. Right ventricular systolic function is normal. The right ventricular  size is normal.   4. The mitral valve was not well visualized. No evidence of mitral valve  regurgitation.   5. The aortic valve was not well visualized. Aortic valve regurgitation  is not visualized.   6. The inferior vena cava is normal in size with greater than 50%  respiratory variability, suggesting right atrial pressure of 3 mmHg.    03/20/2020 Imaging   CT CAP W contrast  IMPRESSION: 1. Small right axillary/subpectoral lymph nodes. Difficult to exclude metastatic disease. 2. Subtle 6 mm low-attenuation lesion in the dome of the liver, not well seen previously. Lesion is too small to characterize. Further evaluation  could be performed with MR abdomen without and with contrast, as clinically indicated. 3. Hepatic steatosis. 4. Aortic atherosclerosis (ICD10-I70.0). Coronary artery calcification.     03/20/2020 Imaging   Whole body bone scan  IMPRESSION: 1. Increased activity noted over the right breast, possibly related to the patient's right breast cancer.   2. Punctate area of increased activity over the left maxillary region, most likely related to sinus disease or dental disease. Increased activity noted over the left mandible also possibly related to dental disease. No other focal bony abnormalities to suggest metastatic disease identified.   03/22/2020 Procedure   PAC placement by Dr [name]    03/22/2020 - 08/23/2020 Chemotherapy   AC q2weeks for 4 cycles 03/22/20-05/03/20 followed by weekly Taxol and Carboplatin q3weeks for 12 weeks starting 05/17/20.                  Week 2 Taxol reduced to half dose and CT was dose reduced starting with week 4 due to thrombocytopenia. Given pancytopenia, will change Carboplatin to 50% dose weekly with Taxol 22m/m2 starting with week 7 (06/13/20). Will hold Carboplatin as needed based on labs. Carboplatin increased to 80% starting with week 8. Carboplatin dose reduced for C11 and held with C12. C12 Taxol postponed to 08/23/20   03/22/2020 Pathology Results   FINAL MICROSCOPIC DIAGNOSIS:   A. BREAST, RIGHT, PUNCH BIOPSY:  - Carcinoma involving dermal lymphatics.  - See comment.   B. BREAST, LEFT, PUNCH BIOPSY:  - Carcinoma involving dermal lymphatics.  - See comment.   COMMENT:  The  morphology is compatible with ductal breast carcinoma.    ADDENDUM:   PROGNOSTIC INDICATOR RESULTS:   Immunohistochemical and morphometric analysis performed manually   The tumor cells are NEGATIVE for Her2 (0%).   Estrogen Receptor:       NEGATIVE, 0%  Progesterone Receptor:   NEGATIVE, 0%    08/13/2020 Breast MRI   IMPRESSION: 1. Interval significant response to  treatment of the diffuse non-mass enhancement involving the UPPER RIGHT breast. There is a solitary residual 5 mm indeterminate mass in the UPPER OUTER QUADRANT at MIDDLE depth with plateau kinetics. 2. Minimal response to treatment of the diffuse non-mass enhancement throughout the LEFT breast. 3. Resolution of enhancement of the skin and nipple-areolar complex of the RIGHT breast. 4. Near complete resolution of enhancement of the skin and nipple-areolar complex of the LEFT breast. Minimal residual nipple-areola complex enhancement persists. 5. The 3 previously identified pathologic RIGHT axillary lymph nodes are now normal in appearance. There is no pathologic lymphadenopathy currently.   08/23/2020 - 01/13/2021 Chemotherapy   Immunotherapy Keytruda q3weeks starting 08/23/20 for 1 year treatment. D/c on 01/13/21 given disease recurrence/progression   10/01/2020 Surgery   MASTECTOMIES WITH RIGHT SENTINEL LYMPH NODE BIOPSIES,RIGHT TARGETED RADIOACTIVE SEED LYMPH NODE, LEFT BREAST MODIFIED RADICAL MASTECTOMY  and RADIOACTIVE SEED GUIDED  TARGETED RIGHT SENTINEL LYMPH NODE BIOPSY by Dr [name]     10/01/2020 Pathology Results   FINAL MICROSCOPIC DIAGNOSIS:   A. BREAST, LEFT, MASTECTOMY:  - Invasive ductal carcinoma, 7 cm.  - Anterior and posterior soft tissue margins not involved by tumor.  - Foci of skin involvement by tumor, see comment.  - Nipple involvement by tumor.  - See oncology table and comment.   B. LYMPH NODE, LEFT AXILLARY, CONTENTS:  - One lymph node with no metastatic carcinoma (0/1).   C. BREAST, RIGHT, MASTECTOMY:  - Invasive ductal carcinoma, greater than 5 cm.  - Margins not involved.  - Subareolar breast tissue involved by tumor.  - See oncology table and comment.   D. LYMPH NODE, RIGHT #2, SENTINEL, EXCISION:  - Metastatic carcinoma in one lymph node, 0.5 cm (1/1).   E. LYMPH NODE, RIGHT #1, SENTINEL, EXCISION:  - Metastatic carcinoma in one lymph node,  1.5 cm (1/1).  - Biopsy site and biopsy clip.   F. LYMPH NODE, RIGHT #3, SENTINEL, EXCISION:  - Metastatic carcinoma in one lymph node, 1.5 cm (1/1).   G. LYMPH NODE, RIGHT #4, SENTINEL, EXCISION:  - One lymph node with no metastatic carcinoma (0/1).   H. LYMPH NODE, RIGHT #5, SENTINEL, EXCISION:  - Metastatic carcinoma in one lymph node, 0.5 cm (1/1).   I. LYMPH NODE, RIGHT #6, SENTINEL, EXCISION:  - One lymph node with no metastatic carcinoma (0/1).     A.  LEFT BREAST PROGNOSTIC INDICATOR RESULTS:   Immunohistochemical and morphometric analysis performed manually   The tumor cells are NEGATIVE for Her2 (0).   Estrogen Receptor:       0%, Negative  Progesterone Receptor:   0%, Negative  Proliferation Marker Ki-67:   80%   Comment: The negative hormone receptor study(ies) in the case has an  internal positive control.   Reference Range Estrogen and Progesterone Receptor       Negative  0%       Positive  >1%   All controls stained appropriately.   C. RIGHT BREAST PROGNOSTIC INDICATOR RESULTS:   Immunohistochemical and morphometric analysis performed manually   The tumor cells are NEGATIVE for Her2 (0).  Estrogen Receptor:       0%, Negative  Progesterone Receptor:   0%, Negative  Proliferation Marker Ki-67:   90%   10/01/2020 Cancer Staging   Staging form: Breast, AJCC 8th Edition - Pathologic stage from 10/01/2020: Stage IIIC (pT4b, pN0, cM0, G3, ER-, PR-, HER2-) - Signed by Alla Feeling, NP on 10/24/2020   10/14/2020 Surgery   RIGHT AXILLARY LYMPH NODE DISSECTION by Dr [name]  -1 /13 positive LN for metastatic carcinoma    11/18/2020 - 01/20/2021 Chemotherapy   Adjuvant Xeloda 1500 mg BID days 1-14 q21 days x6 months, starting 11/18/2020 (will reduce to 1500 mg Am/1000 mg Pm M-F during radiation). Stopped 01/20/21 due to liver mets/disease recurrence   12/10/2020 - 01/24/2021 Radiation Therapy   Adjuvant Radiation to right chest wall and axilla with Dr Lisbeth Renshaw  on 12/10/20-01/24/21   12/16/2020 Tumor Marker   CA 27.29 at 582.2 on 11/14/20   Ca 27.29 at 1387.9 on 12/16/20   01/13/2021 Progression   PET IMPRESSION: 1. Interval development of bulky hypermetabolic liver metastases involving both hepatic lobes. 2. Hypermetabolic lesion in the posterior left hemithorax. This is obscured by small pleural effusion on CT imaging and could be a pulmonary nodule or pleural nodule. Given superimposition of the pleural fluid, pleural nodule is favored. 3. Tiny subtle soft tissue nodule anterior paramidline left hemithorax with low level FDG uptake raises concern for a second site of pleural disease in the left chest. 4.  Aortic Atherosclerois (ICD10-170.0)   01/15/2021 Pathology Results   FINAL MICROSCOPIC DIAGNOSIS:   A. LIVER, RIGHT LOBE, NEEDLE CORE BIOPSY:  - Metastatic carcinoma to liver, consistent with patient's clinical  history of primary breast carcinoma.  See comment    PROGNOSTIC INDICATOR RESULTS:   Immunohistochemical and morphometric analysis performed manually   The tumor cells are NEGATIVE for Her2 (1+).   Estrogen Receptor:       POSITIVE, 20%, WEAK STAINING  Progesterone Receptor:   NEGATIVE  Proliferation Marker Ki-67:   30%    01/27/2021 - 03/03/2021 Chemotherapy   First-line IV Trodelvy 2 weeks on/1 week off starting 01/27/21. After first dose she developed moderate pancytopenia. Her thrombocytopenia did not improve off treatment. With second dose on 02/12/21, will reduce Trodelvy dose by 50% and change to every 2 weeks.  --D/c 03/03/21 due to worsened liver function and progressed liver mets.     Genetic Testing   Negative genetic testing. No pathogenic variants identified on the [facility] CustomNext+RNA panel. The report date is 01/29/2021.   The CustomNext-Cancer + RNAinsight panel  includes sequencing and/or deletion duplication testing of the following 47 genes: APC, ATM, AXIN2, BARD1, BMPR1A, BRCA1, BRCA2, BRIP1, CDH1, CDK4, CDKN2A,  CHEK2, DICER1, MEN1, MLH1, MSH2, MSH3, MSH6, MUTYH, NBN, NF1, NF2, NTHL1, PALB2, PMS2, PTEN, RAD51C, RAD51D, RECQL, RET, SDHA, SDHAF2, SDHB, SDHC, SDHD, SMAD4, SMARCA4, STK11, TP53, TSC1, TSC2 and VHL (sequencing and deletion/duplication); HOXB13, POLD1 and POLE (sequencing only); EPCAM and GREM1 (deletion/duplication only). RNA data is routinely analyzed for use in variant interpretation for all genes.   02/26/2021 Imaging   US liver  IMPRESSION: 1. Numerous liver lesions are identified compatible with diffuse liver metastases. 2. Gallbladder wall thickness is upper limits of normal at 3 mm. This is nonspecific and may reflect incomplete distention.     03/05/2021 -  Chemotherapy    Patient is on Treatment Plan: BREAST CARBOPLATIN / GEMCITABINE D1,8 Q21D       Chemotherapy   Second-line Carboplatin and Gemcitabine 2 weeks  on/1 weeks off starting this week       CURRENT THERAPY:  Second-line Carboplatin and Gemcitabine 2 weeks on/1 weeks off starting 03/05/21.  INTERVAL HISTORY: *** Victoria Coffey is here for a follow up. She presents to the clinic alone.    REVIEW OF SYSTEMS:  *** Constitutional: Denies fevers, chills or abnormal weight loss Eyes: Denies blurriness of vision Ears, nose, mouth, throat, and face: Denies mucositis or sore throat Respiratory: Denies cough, dyspnea or wheezes Cardiovascular: Denies palpitation, chest discomfort or lower extremity swelling Gastrointestinal:  Denies nausea, heartburn or change in bowel habits Skin: Denies abnormal skin rashes Lymphatics: Denies new lymphadenopathy or easy bruising Neurological:Denies numbness, tingling or new weaknesses Behavioral/Psych: Mood is stable, no new changes  All other systems were reviewed with the patient and are negative.  MEDICAL HISTORY:  Past Medical History:  Diagnosis Date  . Aortic atherosclerosis ([place])   . Breast cancer ([place]) dx'd 1993   left  . Fatty liver   . History of left breast cancer    . HIV infection ([place])   . Hypertension   . Port-A-Cath in place     SURGICAL HISTORY: Past Surgical History:  Procedure Laterality Date  . AXILLARY LYMPH NODE DISSECTION Right 10/14/2020   Procedure: left axilla removal of two drains right axillary lymph node dissection;  Surgeon: [name], MD;  Location: [facility];  Service: General;  Laterality: Right;  PEC BLOCK, RNFA OR PA  . BREAST BIOPSY Bilateral 03/22/2020   Procedure: BILATERAL BREAST PUNCH BIOPSIES;  Surgeon: [name], MD;  Location: [facility];  Service: General;  Laterality: Bilateral;  . BREAST LUMPECTOMY    . CESAREAN SECTION    . left breast lumpectomy  1993  . MASTECTOMY W/ SENTINEL NODE BIOPSY Bilateral 10/01/2020  . MASTECTOMY W/ SENTINEL NODE BIOPSY Bilateral 10/01/2020   Procedure: BILATERAL MASTECTOMIES WITH RIGHT SENTINEL LYMPH NODE BIOPSIES,RIGHT TARGETED RADIOACTIVE SEED LYMPH NODE, LEFT BREAST MODIFIED RADICAL MASTECTOMY ;  Surgeon: [name], MD;  Location: [facility];  Service: General;  Laterality: Bilateral;  PEC BLOCK, RNFA  . PORTACATH PLACEMENT Right 03/22/2020   Procedure: INSERTION PORT-A-CATH WITH ULTRASOUND GUIDANCE;  Surgeon: [name], MD;  Location: [facility];  Service: General;  Laterality: Right;  . RADIOACTIVE SEED GUIDED AXILLARY SENTINEL LYMPH NODE Right 10/01/2020   Procedure: RADIOACTIVE SEED GUIDED  TARGETED RIGHT SENTINEL LYMPH NODE BIOPSY;  Surgeon: [name], MD;  Location: [facility];  Service: General;  Laterality: Right;  PEC BLOCK, RNFA    I have reviewed the social history and family history with the patient and they are unchanged from previous note.  ALLERGIES:  is allergic to codeine, other, grapefruit bioflavonoid complex, pomegranate [punica], and shellfish allergy.  MEDICATIONS:  Current Outpatient Medications  Medication Sig Dispense Refill  . acetaminophen (TYLENOL) 500 MG tablet Take 1,000 mg by mouth every 6 (six) hours as needed for moderate pain or headache.     .  emtricitabine-rilpivir-tenofovir AF (ODEFSEY) 200-25-25 MG TABS tablet Take 1 tablet by mouth daily with breakfast. 30 tablet 11  . hydrochlorothiazide (HYDRODIURIL) 25 MG tablet TAKE 1 TABLET(25 MG) BY MOUTH DAILY (Patient taking differently: Take 25 mg by mouth daily.) 30 tablet 5  . lidocaine-prilocaine (EMLA) cream Apply 1 application topically See admin instructions. Apply 1-2 hours prior to access    . loratadine (CLARITIN) 10 MG tablet Take 10 mg by mouth See admin instructions. Takes 3-4 days before and after treatment    . ondansetron (ZOFRAN) 8 MG  tablet Take 1 tablet (8 mg total) by mouth every 8 (eight) hours as needed for nausea or vomiting. 20 tablet 0  . potassium chloride SA (KLOR-CON) 20 MEQ tablet Take 1 tablet (20 mEq total) by mouth daily. 30 tablet 0  . prochlorperazine (COMPAZINE) 10 MG tablet Take 1 tablet (10 mg total) by mouth every 6 (six) hours as needed (Nausea or vomiting). 30 tablet 2  . traMADol (ULTRAM) 50 MG tablet Take 1 tablet (50 mg total) by mouth every 6 (six) hours as needed (mild pain). 30 tablet 1   No current facility-administered medications for this visit.    PHYSICAL EXAMINATION: ECOG PERFORMANCE STATUS: {CHL ONC ECOG PS:[id number]}  There were no vitals filed for this visit. There were no vitals filed for this visit. *** GENERAL:alert, no distress and comfortable SKIN: skin color, texture, turgor are normal, no rashes or significant lesions EYES: normal, Conjunctiva are pink and non-injected, sclera clear {OROPHARYNX:no exudate, no erythema and lips, buccal mucosa, and tongue normal}  NECK: supple, thyroid normal size, non-tender, without nodularity LYMPH:  no palpable lymphadenopathy in the cervical, axillary {or inguinal} LUNGS: clear to auscultation and percussion with normal breathing effort HEART: regular rate & rhythm and no murmurs and no lower extremity edema ABDOMEN:abdomen soft, non-tender and normal bowel sounds Musculoskeletal:no  cyanosis of digits and no clubbing  NEURO: alert & oriented x 3 with fluent speech, no focal motor/sensory deficits  LABORATORY DATA:  I have reviewed the data as listed CBC Latest Ref Rng & Units 03/10/2021 03/03/2021 02/26/2021  WBC 4.0 - 10.5 K/uL 6.4 9.2 9.7  Hemoglobin 12.0 - 15.0 g/dL 9.1(L) 9.9(L) 9.6(L)  Hematocrit 36.0 - 46.0 % 26.6(L) 29.5(L) 29.2(L)  Platelets 150 - 400 K/uL 31(L) 73(L) 73(L)     CMP Latest Ref Rng & Units 03/10/2021 03/03/2021 02/26/2021  Glucose 70 - 99 mg/dL 159(H) 149(H) 107(H)  BUN 8 - 23 mg/dL 38(H) 19 14  Creatinine 0.44 - 1.00 mg/dL 0.97 0.85 0.82  Sodium 135 - 145 mmol/L 140 132(L) 131(L)  Potassium 3.5 - 5.1 mmol/L 3.9 3.8 4.1  Chloride 98 - 111 mmol/L 102 99 101  CO2 22 - 32 mmol/L 20(L) 22 18(L)  Calcium 8.9 - 10.3 mg/dL 8.8(L) 8.5(L) 8.5(L)  Total Protein 6.5 - 8.1 g/dL 6.1(L) 6.0(L) 6.0(L)  Total Bilirubin 0.3 - 1.2 mg/dL 12.4(HH) 5.9(HH) 4.8(HH)  Alkaline Phos 38 - 126 U/L 2,329(H) 2,512(H) 2,308(H)  AST 15 - 41 U/L 304(HH) 353(HH) 295(HH)  ALT 0 - 44 U/L 117(H) 71(H) 90(H)      RADIOGRAPHIC STUDIES: I have personally reviewed the radiological images as listed and agreed with the findings in the report. No results found.   ASSESSMENT & PLAN:  Victoria Coffey is a 65 y.o. female with    1.InflammatoryLeft breast cancer,pT4N0Mx,ypT4N0,triple negativeAND Right breast cancer withaxillary node metastasis,pT3N2aM0,ERweakly+,PR/HER2-,ypT3N2a, triple negative,GradeIII, diffuse liver metastasis(ER weakly+/PR-/HER-)in 12/2020 -She was diagnosed with left breast cancer in 02/2020.She presented with inflammatory left breast cancer. Biopsy of left breast mass and right axillary LN were positive for Invasive ductal carcinoma.Further work up showed 9cm nodular area in right breast and skin biopsy confirmed cancer involvement.Staging scansin May 2021were negative for distant metastasis. -She completed AC X4 and weekly TC 03/22/20-08/23/20.  She underwent b/l mastectomy with Dr [name] on 10/01/20.Shealsounderwent right axillary lymph node dissection on 10/14/2020 and completed Adjuvant radiation with Dr Moody2/8/22-3/25/22. -Given recentpublish [employer] 522 trial data,I started her on Immunotherapy Keytruda q3weeks for 1 year of treatment beginning 08/23/20.Due tohersignificantresidual diseasein  LNs,Istarted her onadjuvant Xelodafor 6 months on 11/18/20. Unfortunately she progressed/recurred with diffuse liver mets on 01/13/21 PET and 01/15/21 Liver biopsy (Her/PR negative/ER 20% weakly positive).Keytruda/Xeloda d/c.  -I have requested [facility] one on her biopsy sample,results still pending.  -Istarted her on first-line IV Trodelvy 2 weeks on/1 week offbeginning 01/27/21.After first dose she developed moderate pancytopenia. Her thrombocytopenia did not improve off treatmentso I reducedTrodelvy dose by 50% and change to every 2 weeks starting 02/12/21 (second dose). -Since she started chemotherapy(Trodelvy), LFTs slightly improved.Unfortunately s/p 2 doses, her LFTs and tbili significantly worsened. Work up with liver US showed diffuse liver mets but no CBD obstruction as etiology. This is from her liver mets. The only way to improve her jaundice and liver function is to treat her cancer. However, with decreased liver function, her treatment options are very limited.  -I started her on second-lineCarboplatin and Gemcitabine 2 weeks on/1 weeks off starting 03/05/21.  -She tolerated first dose well with stable fatigue and able to maintain weight. Labs show, Hg 9.1, plt 31K. With this drop in platelets. I recommend postponing second dose to 5/16, if labs improve. I discussed we amy change regimen to every 2 weeks if better tolerable.  -If she is not able to tolerate treatment I discussed, stopping chemo altogether given risk outweigh the benefit.  -F/u next week 5/16   2.Transaminitis, hyperbilirubinemia, Jaundice,  secondary toliver metastasis -Since she started chemotherapy(Trodelvy), LFTs slightly improved.  -However,02/26/21 labsshow AST 295, ALT 90, Alk Phos 2308, Tbili 4.8. She has had dark urinesince 3 days before.She also has hepatomegaly and mild jaundice of eyes on exam(02/26/21).  -Her 02/26/21 US showed known liver mets andGallbladder wall thickness is upper limits of normal at 3 mm. No obvious CBD obstruction.  -In the past week, she continues to have clinical jaundice and her hepatomegaly has increased on exam (03/03/21). Her labs today show, AST 353, ALT 71, Alk Phos 2512, Tbili 5.9.  -Will d/c Trodelvy. Her future treatment options are limited given herabnormal liver function -Her jaundice is mostly clear at home (with urine and eyes), but becomes more jaundice when in clinic. I will call with LFT results from today (03/10/21)  3.Cytopenias, Moderate Thrombocytopenia -secondary to chemo(Trodelvy) -Mild and stableanemia and intermittent leukopenia.Continue monitoring labs weekly -After C1D1 Trodelvy her blood counts dropped.After being off Trodelvy for 2 weeks,labs improved,but her plt remain low. Dose reduced to every 2 weeks form 02/12/21.Will only giveGCSFas needed. -Her Plt remain in low ranges. I started her on Promacta $RemoveBef'50mg'jvByFVAZCz$  daily (02/12/21). If not enough, will given NPlate. -With start of second line Gem/Carbo, her plt did decrease further at 31K (03/10/21). I recommend increase Promacta to $RemoveBef'50mg'QTfQcpXBdW$  BID.   4. H/o Left breast cancer, Dx in 1993s/p lumpectomy, radiation, chemo, Tamoxifen.Genetic Testingfrom 01/14/21 wasnegative for pathogenetic mutations  5. Comorbidities: HIV(+)Dx 1993, HTN -Her HIV has been undetectable and well controlled. She is currently being seen by ID Dr Graylon Good -Ipreviouslyreferredher for Evusheld injection for COVID prevention  6.Goal of care discussion  -We previously discussed the incurable nature of her cancer, and the  overallgardedprognosis, due to the rapid disease progression, and worsening liver function -The patient understands the goal of care is palliative. -I recommend DNR/DNI,patient is agreeable, however her husband is not ready for that decision. We will give patient and her family to process and discuss  -I discussed with decreased liver function and increased disease burden, she will likely become more symptomatic and may require more medical help at home. I recommend Palliative  home care for this extra medical help. She will think about it.   PLAN: -Increase Promacta to 170m daily  -Labs reviewed with worsened thrombocytopenia. No chemo this week.  -Lab, flush, F/u and Carbo/Gem on 5/16   No problem-specific Assessment & Plan notes found for this encounter.   No orders of the defined types were placed in this encounter.  All questions were answered. The patient knows to call the clinic with any problems, questions or concerns. No barriers to learning was detected. The total time spent in the appointment was {CHL ONC TIME VISIT - SYEBXI:[id number]}     AJoslyn Devon5/13/2022   IOneal Deputy am acting as scribe for YTruitt Merle MD.   {Add scribe attestation statement}

## 2021-03-14 NOTE — Telephone Encounter (Signed)
Scheduled follow-up appointment per 5/9 los. Patient is aware. ?

## 2021-03-14 NOTE — ED Triage Notes (Signed)
Pt arrived via [facility] unresponsive with assisted ventilations. EMS reports that pt had witnessed syncope by daughter at home which prompted her to call for EMS. FD found pt to be pulseless and apneic and began CPR with "no shock advised." EMS further reports that they continued CPR for approx 5-10 minutes with lucas, 1 epi, and 25g d10 with initial CBG of 32. IO in place.

## 2021-03-14 NOTE — H&P (Signed)
Victoria Coffey is an 66 y.o. female.   Chief Complaint: cardiac arrest HPI: Victoria Coffey is a 65 year old lady with PMH significant for hypertension and recurrent breast cancer with metastasis to the liver.  The patient was first diagnosed in 26.  At that time she was treated with lumpectomy, chemotherapy and radiotherapy.  Family reported that patient was found to have recurrent disease in April of 2021 and again was treated with chemotherapy and XRT. Family reported that patient was again found to have recurrent breast cancer with mets to the liver this time.  Her last treatment of chemotherapy was 4 weeks ago.  Patient 's daughter reported that her mother called out to her asking her to assit her in the bathroom because she needed to change her depends and wanted to have a shower.  Daughter had noted that patient's speech was somewhat slurred earlier in the day.  Daughter reported that her Mum had a large amount of stool soiling and while assisting her mother with personal hygeine, the patient had a syncopal event and became unresponsive.  EMS was called and directed the daughter to begin CPR.  Daughter reported that it took about 8 minutes for EMS to arrive and EMS reported about 20 minutes to ROSC.  The patient was brought to the ED where she was intubated and started on Levophed drip along with IVF resuscitation.  CT of the head and neck were unremarkable for acute pathology.  The CT abdomen and pelvis revealed infiltrates consistent with aspiration in lower lung lobes, liver metastasis, gallbladder sludge and colitis.  I have seen and examined the patient.  She could not contribute to this consult.  She was already on the ventilator and on Levophed drip.    No past medical history on file.   No family history on file. Social History:  has no history on file for tobacco use, alcohol use, and drug use.  Allergies:  Allergies  Allergen Reactions  . Grapefruit Flavor [Flavoring Agent]  Hives  . Pomegranate (Punica Granatum) Hives    (Not in a hospital admission)   Results for orders placed or performed during the hospital encounter of 03/12/2021 (from the past 48 hour(s))  CBC with Differential/Platelet     Status: Abnormal   Collection Time: 03/28/2021  6:24 PM  Result Value Ref Range   WBC 12.1 (H) 4.0 - 10.5 K/uL   RBC 2.33 (L) 3.87 - 5.11 MIL/uL   Hemoglobin 8.3 (L) 12.0 - 15.0 g/dL   HCT 25.5 (L) 36.0 - 46.0 %   MCV 109.4 (H) 80.0 - 100.0 fL   MCH 35.6 (H) 26.0 - 34.0 pg   MCHC 32.5 30.0 - 36.0 g/dL   RDW 20.1 (H) 11.5 - 15.5 %   Platelets PLATELET CLUMPS NOTED ON SMEAR, UNABLE TO ESTIMATE 150 - 400 K/uL    Comment: Immature Platelet Fraction may be clinically indicated, consider ordering this additional test [id number]    nRBC 24.6 (H) 0.0 - 0.2 %    Comment: REPEATED TO VERIFY   Neutrophils Relative % 87 %   Neutro Abs 10.5 (H) 1.7 - 7.7 K/uL   Lymphocytes Relative 6 %   Lymphs Abs 0.7 0.7 - 4.0 K/uL   Monocytes Relative 5 %   Monocytes Absolute 0.6 0.1 - 1.0 K/uL   Eosinophils Relative 0 %   Eosinophils Absolute 0.0 0.0 - 0.5 K/uL   Basophils Relative 0 %   Basophils Absolute 0.0 0.0 - 0.1  K/uL   Immature Granulocytes 2 %   Abs Immature Granulocytes 0.24 (H) 0.00 - 0.07 K/uL    Comment: Performed at [facility], [place] [address], [place], [place] [address]  Comprehensive metabolic panel     Status: Abnormal   Collection Time: 03/23/2021  6:24 PM  Result Value Ref Range   Sodium 118 (LL) 135 - 145 mmol/L    Comment: CRITICAL RESULT CALLED TO, READ BACK BY AND VERIFIED WITH: M.COCHRANE,RN @2012  03/30/2021 VANG.J    Potassium 6.4 (HH) 3.5 - 5.1 mmol/L    Comment: CRITICAL RESULT CALLED TO, READ BACK BY AND VERIFIED WITH: M.COCHRANE,RN @2012  03/11/2021 VANG.J    Chloride 88 (L) 98 - 111 mmol/L   CO2 9 (L) 22 - 32 mmol/L   Glucose, Bld 156 (H) 70 - 99 mg/dL    Comment: Glucose reference range applies only to samples taken after fasting for at  least 8 hours.   BUN 57 (H) 8 - 23 mg/dL   Creatinine, Ser 1.72 (H) 0.44 - 1.00 mg/dL   Calcium 7.7 (L) 8.9 - 10.3 mg/dL   Total Protein 4.7 (L) 6.5 - 8.1 g/dL   Albumin 1.3 (L) 3.5 - 5.0 g/dL   AST 1,439 (H) 15 - 41 U/L   ALT 318 (H) 0 - 44 U/L    Comment: RESULTS CONFIRMED BY MANUAL DILUTION   Alkaline Phosphatase 2,160 (H) 38 - 126 U/L    Comment: RESULTS CONFIRMED BY MANUAL DILUTION   Total Bilirubin 12.0 (H) 0.3 - 1.2 mg/dL   GFR, Estimated 33 (L) >60 mL/min    Comment: (NOTE) Calculated using the CKD-EPI Creatinine Equation (2021)    Anion gap 21 (H) 5 - 15    Comment: Performed at [facility], [place] [address], [place], [place] [address]  Troponin I (High Sensitivity)     Status: Abnormal   Collection Time: 03/05/2021  6:24 PM  Result Value Ref Range   Troponin I (High Sensitivity) 139 (HH) <18 ng/L    Comment: CRITICAL RESULT CALLED TO, READ BACK BY AND VERIFIED WITH: M.COCHRANE,RN @2012  03/26/2021 VANG.J (NOTE) Elevated high sensitivity troponin I (hsTnI) values and significant  changes across serial measurements may suggest ACS but many other  chronic and acute conditions are known to elevate hsTnI results.  Refer to the Links section for chest pain algorithms and additional  guidance. Performed at [facility], [place] [address], [place], [place] [address]   I-stat chem 8, ED (not at [facility] or [facility])     Status: Abnormal   Collection Time: 03/02/2021  6:28 PM  Result Value Ref Range   Sodium 117 (LL) 135 - 145 mmol/L   Potassium 6.3 (HH) 3.5 - 5.1 mmol/L   Chloride 94 (L) 98 - 111 mmol/L   BUN 65 (H) 8 - 23 mg/dL   Creatinine, Ser 1.60 (H) 0.44 - 1.00 mg/dL   Glucose, Bld 143 (H) 70 - 99 mg/dL    Comment: Glucose reference range applies only to samples taken after fasting for at least 8 hours.   Calcium, Ion 0.94 (L) 1.15 - 1.40 mmol/L   TCO2 13 (L) 22 - 32 mmol/L   Hemoglobin 9.2 (L) 12.0 - 15.0 g/dL   HCT 27.0 (L) 36.0 - 46.0 %   Comment NOTIFIED PHYSICIAN    I-Stat arterial blood gas, ED     Status: Abnormal   Collection Time: 03/21/2021  7:38 PM  Result Value Ref Range   pH,  Arterial 7.253 (L) 7.350 - 7.450   pCO2 arterial 38.3 32.0 - 48.0 mmHg   pO2, Arterial 387 (H) 83.0 - 108.0 mmHg   Bicarbonate 17.3 (L) 20.0 - 28.0 mmol/L   TCO2 18 (L) 22 - 32 mmol/L   O2 Saturation 100.0 %   Acid-base deficit 10.0 (H) 0.0 - 2.0 mmol/L   Sodium 124 (L) 135 - 145 mmol/L   Potassium 4.8 3.5 - 5.1 mmol/L   Calcium, Ion 0.92 (L) 1.15 - 1.40 mmol/L   HCT 26.0 (L) 36.0 - 46.0 %   Hemoglobin 8.8 (L) 12.0 - 15.0 g/dL   Patient temperature 95.8 F    Collection site Radial    Drawn by Operator    Sample type ARTERIAL   Hemoglobin A1c     Status: Abnormal   Collection Time: 03-25-21  9:23 PM  Result Value Ref Range   Hgb A1c MFr Bld 4.6 (L) 4.8 - 5.6 %    Comment: (NOTE) Pre diabetes:          5.7%-6.4%  Diabetes:              >6.4%  Glycemic control for   <7.0% adults with diabetes    Mean Plasma Glucose 85.32 mg/dL    Comment: Performed at [facility], [address] [place],  [address]   CT Head Wo Contrast  Result Date: 25-Mar-2021 CLINICAL DATA:  Mental status change.  Unknown cause.  Syncope. EXAM: CT HEAD WITHOUT CONTRAST CT CERVICAL SPINE WITHOUT CONTRAST TECHNIQUE: Multidetector CT imaging of the head and cervical spine was performed following the standard protocol without intravenous contrast. Multiplanar CT image reconstructions of the cervical spine were also generated. COMPARISON:  PET CT 01/13/2021 FINDINGS: CT HEAD FINDINGS Brain: Cerebral ventricle sizes are concordant with the degree of cerebral volume loss. Patchy and confluent areas of decreased attenuation are noted throughout the deep and periventricular white matter of the cerebral hemispheres bilaterally, compatible with chronic microvascular ischemic disease. No evidence of large-territorial acute infarction. No parenchymal hemorrhage. No mass lesion. No extra-axial  collection. No mass effect or midline shift. No hydrocephalus. Basilar cisterns are patent. Vascular: No hyperdense vessel. Skull: No acute fracture or focal lesion. Sinuses/Orbits: Paranasal sinuses and mastoid air cells are clear. The orbits are unremarkable. Other: Secretions within the naso-oropharynx. CT CERVICAL SPINE FINDINGS Alignment: Normal. Skull base and vertebrae: Densely sclerotic lesion within the right C2 articular facet and C2 vertebral body (3:29) that likely represent bone islands and are not noted to be hypermetabolic on the PET CT 17/51/0258. No acute fracture. No aggressive appearing focal osseous lesion or focal pathologic process. Soft tissues and spinal canal: No prevertebral fluid or swelling. No visible canal hematoma. Upper chest: Unremarkable. Other: Partially visualized endotracheal tube within the trachea and enteric tube within the esophagus. Bilateral, left greater than right, mandibular lytic and blastic lesions (6:5, 9). IMPRESSION: 1. No acute intracranial abnormality. 2. No acute displaced fracture or traumatic listhesis of the cervical spine. 3. Indeterminate bilateral, left greater than right, mandibular lytic and blastic lesions. Recommend nonemergent CT max face for further evaluation. Electronically Signed   By: Iven Finn M.D.   On: 03/25/21 20:36   CT Cervical Spine Wo Contrast  Result Date: 03/25/21 CLINICAL DATA:  Mental status change.  Unknown cause.  Syncope. EXAM: CT HEAD WITHOUT CONTRAST CT CERVICAL SPINE WITHOUT CONTRAST TECHNIQUE: Multidetector CT imaging of the head and cervical spine was performed following the standard protocol without intravenous contrast. Multiplanar CT  image reconstructions of the cervical spine were also generated. COMPARISON:  PET CT 01/13/2021 FINDINGS: CT HEAD FINDINGS Brain: Cerebral ventricle sizes are concordant with the degree of cerebral volume loss. Patchy and confluent areas of decreased attenuation are noted  throughout the deep and periventricular white matter of the cerebral hemispheres bilaterally, compatible with chronic microvascular ischemic disease. No evidence of large-territorial acute infarction. No parenchymal hemorrhage. No mass lesion. No extra-axial collection. No mass effect or midline shift. No hydrocephalus. Basilar cisterns are patent. Vascular: No hyperdense vessel. Skull: No acute fracture or focal lesion. Sinuses/Orbits: Paranasal sinuses and mastoid air cells are clear. The orbits are unremarkable. Other: Secretions within the naso-oropharynx. CT CERVICAL SPINE FINDINGS Alignment: Normal. Skull base and vertebrae: Densely sclerotic lesion within the right C2 articular facet and C2 vertebral body (3:29) that likely represent bone islands and are not noted to be hypermetabolic on the PET CT 123456. No acute fracture. No aggressive appearing focal osseous lesion or focal pathologic process. Soft tissues and spinal canal: No prevertebral fluid or swelling. No visible canal hematoma. Upper chest: Unremarkable. Other: Partially visualized endotracheal tube within the trachea and enteric tube within the esophagus. Bilateral, left greater than right, mandibular lytic and blastic lesions (6:5, 9). IMPRESSION: 1. No acute intracranial abnormality. 2. No acute displaced fracture or traumatic listhesis of the cervical spine. 3. Indeterminate bilateral, left greater than right, mandibular lytic and blastic lesions. Recommend nonemergent CT max face for further evaluation. Electronically Signed   By: Iven Finn M.D.   On: 03/30/2021 20:36   DG Chest Port 1 View  Result Date: 03/22/2021 CLINICAL DATA:  Hypoxia.  History of breast carcinoma.  Recent CPR EXAM: PORTABLE CHEST 1 VIEW COMPARISON:  Chest radiograph Mar 22, 2021; PET-CT January 13, 2021 FINDINGS: Endotracheal tube tip is 3.1 cm above the carina. Nasogastric tube tip and side port in stomach. Port-A-Cath tip is at the cavoatrial junction. No  pneumothorax. The lungs are clear. The heart size and pulmonary vascular normal. No appreciable adenopathy. There are surgical clips in each axillary region. No acute fracture evident. IMPRESSION: Tube and catheter positions as described. No pneumothorax. No edema or airspace opacity. Heart size within normal limits. Electronically Signed   By: Lowella Grip III M.D.   On: 03/20/2021 19:35   CT CHEST ABDOMEN PELVIS WO CONTRAST  Result Date: 03/06/2021 CLINICAL DATA:  Syncope pulseless apnea EXAM: CT CHEST, ABDOMEN AND PELVIS WITHOUT CONTRAST TECHNIQUE: Multidetector CT imaging of the chest, abdomen and pelvis was performed following the standard protocol without IV contrast. COMPARISON:  Ultrasound 02/26/2021, PET CT 01/13/2021, CT 03/20/2020 FINDINGS: CT CHEST FINDINGS Cardiovascular: Limited evaluation without intravenous contrast. Nonaneurysmal aorta. Coronary vascular calcification. Normal cardiac size. Trace pericardial effusion. Right-sided central venous port tip at the cavoatrial region. Mediastinum/Nodes: Midline trachea. No thyroid mass. Endotracheal tube tip several cm superior to carina. No suspicious adenopathy. Esophageal tube in the esophagus. Lungs/Pleura: No pleural effusion or pneumothorax. Partial consolidations within the posterior right upper lobe and the bilateral lower lobes. Musculoskeletal: Possible nondisplaced fracture involving the upper body of the sternum. Minimal superior endplate deformity [id number]. Faint foci of sclerosis T11 and T3 without change. CT ABDOMEN PELVIS FINDINGS Hepatobiliary: Hypodense liver masses vaguely visualized corresponding to history of metastatic disease. Liver is enlarged measuring 19 cm craniocaudad. Hyperdense sludge or stones in the gallbladder. The gallbladder wall appears diffusely thickened. No biliary dilatation Pancreas: Mild peripancreatic edema.  No ductal dilatation Spleen: Normal in size without focal abnormality. Adrenals/Urinary Tract: Adrenal  glands  are normal. Kidneys show no hydronephrosis. Foley catheter in the bladder Stomach/Bowel: Esophageal tube tip in the body of the stomach. Decompressed stomach. No dilated small bowel. Negative appendix. Mild fluid in the colon. Possible mild wall thickening at the hepatic flexure. Vascular/Lymphatic: Mild aortic atherosclerosis. No aneurysm. No suspicious nodes Reproductive: Right uterine fundal hyperdensity possibly due to fibroid. No adnexal mass Other: No free air. Small free fluid in the pelvis. Small perihepatic and perisplenic ascites. Musculoskeletal: No acute or suspicious osseous abnormality. IMPRESSION: 1. Partial consolidations within the posterior right upper lobe as well as the posteromedial lower lobes, suspicious for aspiration. 2. Indistinct appearance of pancreas with possible peripancreatic edema as may be seen with pancreatitis. Correlate with appropriate enzymes. 3. Enlarged liver with multiple masses consistent with hepatic metastatic disease. Small amount of ascites within the abdomen and pelvis. 4. Sludge within the gallbladder. Gallbladder wall appears diffusely thickened which is a nonspecific finding and may be correlated with ultrasound as indicated. 5. Fluid within the colon. Possible mild wall thickening at hepatic flexure as may be seen with a colitis. Electronically Signed   By: Donavan Foil M.D.   On: 03/30/2021 20:46    Review of Systems  Reason unable to perform ROS: intubated.    Blood pressure 104/87, pulse (!) 101, temperature (!) 95.8 F (35.4 C), temperature source Temporal, resp. rate 16, height 5\' 2"  (1.575 m), SpO2 100 %. Physical Exam Vitals reviewed.  Constitutional:      Comments: Comfortable on ventilator  HENT:     Head: Normocephalic and atraumatic.     Nose: Nose normal.     Mouth/Throat:     Comments: ET tube in place Eyes:     General: Scleral icterus present.     Comments: Pupils symmetrical but un reactive to light reflerx   Cardiovascular:     Rate and Rhythm: Regular rhythm. Tachycardia present.     Heart sounds: Normal heart sounds. No murmur heard.     Comments: Chest: bilateral mastectomies  Pulmonary:     Breath sounds: No wheezing or rhonchi.     Comments: Vent: PRVC, RR 18, TV 400, PEEP 5, FIO2 50% Abdominal:     Comments: Distended, in audile bowel sounds  Musculoskeletal:        General: Swelling present.     Cervical back: Neck supple.     Comments: 2+ oedema bilateral lower extremities Trace oedema bilateral upper extremities  Skin:    Coloration: Skin is jaundiced.     Comments: Mottling at bilateral knees, cyanotic toes on both feet  Neurological:     Comments: No cough, no gag reflex at the time of this exam, RASS -5, no responses to noxious stimuli      Assessment/Plan 1. Cardiopulmonary arrest secondary to hyperkalemia.  Patient had bystander CPR. EMS took 8 minutes to arrive and time to ROS 20 minutes. Plan: did not initiate hypothermic protocol since patient has terminal illness with < 6 months of Expected mortality.  Will continue to monitor for neurological recovery.  2. Hyperkalemia has been treated. Plan: check phosphorus level. R/O tumour lysis syndrome. Patient is not on any meds that Would cause hyperkalemia. Monitor K+ levels, hydrate.  3. Septic shock secondary to colitis and aspiration pneumonia Plan: vasopressor support as needed. Add vasopressin to Levophed as needed. Flagyl antibiotics. F/U cultures.  4. Acute hypoxic respiratory failure secondary to cardiac arrest and aspiration pneumonia Plan: lung protective vent protocol, sputum culture, empirical ABX with Flagyl, Duo nebs.  5. Acute hepatic failure with obstructive jaundice from metastatic breast cancer- liver mets Plan; trend LFTs, glucose monitoring, check coags and correct for bleeding. check ammonia levels.  6.Acute kidney injury secondary to hypoperfusion from cardiac arrest superimposed on ATN Plan:  avoid nephrotoxins.  7. Chronic anemia and thrombocytopenia ( clumped platelets) secondary to chemotherapy. Plan: transfuse per protocol.  8. Acute coronary syndrome secondary to cardiac arrest from hyperkalemia. Plan: trend troponin.  9. Recurrent breast cancer with liver metastasis. Plan: palliative care medicine consult.  Drips: LR, Levophed Lines: Porta Cath, PIV Prophylaxis: Protonix, SCD Nutrition: NPO Code: FULL  Family: husband and 2 children updated several times.  Thank you for allowing me the privilege to care for this patient.    I have dedicated a total of 80 minutes in critical care time minus all appropriate exclusions.  [name] Dragon, MD 03/19/2021, 10:21 PM

## 2021-03-14 NOTE — Progress Notes (Signed)
eLink Physician-Brief Progress Note Patient Name: LOTTA FRANKENFIELD DOB: 1956-06-21 MRN: [id number]   Date of Service  03-16-2021  HPI/Events of Note  Patient on a Norepinephrine IV infusion via Chemo port. Order for Norepinephrine is expiring.   eICU Interventions  Plan: 1. Norepinephrine IV infusion. Titrate to MAP >= 65.     Intervention Category Major Interventions: Hypotension - evaluation and management  Lysle Dingwall 16-Mar-2021, 10:52 PM

## 2021-03-14 NOTE — ED Provider Notes (Signed)
[facility] EMERGENCY DEPARTMENT Provider Note   CSN: [id number] Arrival date & time: 03/31/2021  1817     History Chief Complaint  Patient presents with  . Cardiac Arrest    Victoria Coffey is a 65 y.o. female.  HPI 65 year old female with history of breast cancer with metastases to the liver presents the emergency department after CPR.  Patient is unresponsive upon arrival, undergoing BVM respirations.  Unable to contribute to history and review of systems.  Per EMS, report that daughter saw patient syncopized.  Was found to be pulseless and apneic.  Daughter started CPR.  EMS continued CPR for about 5 to 10 minutes.  They gave 1 round of epi and 25 g of D10.  Left tibial IO was placed.  ROSC was achieved prior to transportation.    No past medical history on file.  Patient Active Problem List   Diagnosis Date Noted  . Cardiac arrest ([place]) 03/23/2021   Medical history reviewed, no pertinent medical history.   OB History   No obstetric history on file.     No family history on file.     Home Medications Prior to Admission medications   Medication Sig Start Date End Date Taking? Authorizing Provider  hydrochlorothiazide (HYDRODIURIL) 25 MG tablet Take 25 mg by mouth daily. 02/18/21  Yes [provider]  ondansetron (ZOFRAN) 8 MG tablet Take 8 mg by mouth every 8 (eight) hours as needed for nausea or vomiting. 03/03/21  Yes [provider]  traMADol (ULTRAM) 50 MG tablet Take 50 mg by mouth 4 (four) times daily as needed for moderate pain. 03/03/21  Yes [provider]    Allergies    Grapefruit flavor [flavoring agent] and Pomegranate (punica granatum)  Review of Systems   Review of Systems  Unable to perform ROS: Patient unresponsive    Physical Exam Updated Vital Signs BP 104/87   Pulse (!) 101   Temp (!) 95.8 F (35.4 C) (Temporal)   Resp 16   Ht _0  (1.575 m)   Wt 56.4 kg   SpO2 100%   BMI 22.74 kg/m    Physical Exam Vitals and nursing note reviewed.  Constitutional:      Comments: Eyes open, not responding to physical environment  HENT:     Head: Atraumatic.     Right Ear: External ear normal.     Left Ear: External ear normal.  Eyes:     General: Scleral icterus present.     Comments: 4 mm pupils bilaterally, not responding briskly to light  Cardiovascular:     Rate and Rhythm: Normal rate and regular rhythm.  Pulmonary:     Comments: Bilateral breath sounds present Abdominal:     General: There is distension.     Palpations: Abdomen is soft.  Musculoskeletal:     Cervical back: Neck supple.     Right lower leg: Edema present.     Left lower leg: Edema present.     Comments: Bilateral mastectomies  Skin:    Capillary Refill: Capillary refill takes less than 2 seconds.     Coloration: Skin is jaundiced.     Findings: Bruising present.  Neurological:     GCS: GCS eye subscore is 4. GCS verbal subscore is 2. GCS motor subscore is 2.  Psychiatric:     Comments: Deferred     ED Results / Procedures / Treatments   Labs (all labs ordered are listed, but only abnormal results are  displayed) Labs Reviewed  CBC WITH DIFFERENTIAL/PLATELET - Abnormal; Notable for the following components:      Result Value   WBC 12.1 (*)    RBC 2.33 (*)    Hemoglobin 8.3 (*)    HCT 25.5 (*)    MCV 109.4 (*)    MCH 35.6 (*)    RDW 20.1 (*)    nRBC 24.6 (*)    Neutro Abs 10.5 (*)    Abs Immature Granulocytes 0.24 (*)    All other components within normal limits  COMPREHENSIVE METABOLIC PANEL - Abnormal; Notable for the following components:   Sodium 118 (*)    Potassium 6.4 (*)    Chloride 88 (*)    CO2 9 (*)    Glucose, Bld 156 (*)    BUN 57 (*)    Creatinine, Ser 1.72 (*)    Calcium 7.7 (*)    Total Protein 4.7 (*)    Albumin 1.3 (*)    AST 1,439 (*)    ALT 318 (*)    Alkaline Phosphatase 2,160 (*)    Total Bilirubin 12.0 (*)    GFR, Estimated 33 (*)    Anion gap 21 (*)     All other components within normal limits  AMMONIA - Abnormal; Notable for the following components:   Ammonia 106 (*)    All other components within normal limits  LACTIC ACID, PLASMA - Abnormal; Notable for the following components:   Lactic Acid, Venous 10.8 (*)    All other components within normal limits  HEMOGLOBIN A1C - Abnormal; Notable for the following components:   Hgb A1c MFr Bld 4.6 (*)    All other components within normal limits  PROTIME-INR - Abnormal; Notable for the following components:   Prothrombin Time 40.4 (*)    INR 4.2 (*)    All other components within normal limits  APTT - Abnormal; Notable for the following components:   aPTT 96 (*)    All other components within normal limits  GLUCOSE, CAPILLARY - Abnormal; Notable for the following components:   Glucose-Capillary 155 (*)    All other components within normal limits  I-STAT CHEM 8, ED - Abnormal; Notable for the following components:   Sodium 117 (*)    Potassium 6.3 (*)    Chloride 94 (*)    BUN 65 (*)    Creatinine, Ser 1.60 (*)    Glucose, Bld 143 (*)    Calcium, Ion 0.94 (*)    TCO2 13 (*)    Hemoglobin 9.2 (*)    HCT 27.0 (*)    All other components within normal limits  I-STAT ARTERIAL BLOOD GAS, ED - Abnormal; Notable for the following components:   pH, Arterial 7.253 (*)    pO2, Arterial 387 (*)    Bicarbonate 17.3 (*)    TCO2 18 (*)    Acid-base deficit 10.0 (*)    Sodium 124 (*)    Calcium, Ion 0.92 (*)    HCT 26.0 (*)    Hemoglobin 8.8 (*)    All other components within normal limits  TROPONIN I (HIGH SENSITIVITY) - Abnormal; Notable for the following components:   Troponin I (High Sensitivity) 139 (*)    All other components within normal limits  TROPONIN I (HIGH SENSITIVITY) - Abnormal; Notable for the following components:   Troponin I (High Sensitivity) 199 (*)    All other components within normal limits  CULTURE, BLOOD (SINGLE)  SARS CORONAVIRUS 2 (TAT 6-24 HRS)  CULTURE, RESPIRATORY W GRAM STAIN  CULTURE, BLOOD (ROUTINE X 2)  CULTURE, BLOOD (ROUTINE X 2)  MRSA PCR SCREENING  PATHOLOGIST SMEAR REVIEW  BASIC METABOLIC PANEL  CBC  MAGNESIUM  BLOOD GAS, ARTERIAL  PHOSPHORUS  HIV ANTIBODY (ROUTINE TESTING W REFLEX)  LACTIC ACID, PLASMA  HEPATIC FUNCTION PANEL  URINALYSIS, ROUTINE W REFLEX MICROSCOPIC    EKG None  Radiology CT Head Wo Contrast  Result Date: 03/22/2021 CLINICAL DATA:  Mental status change.  Unknown cause.  Syncope. EXAM: CT HEAD WITHOUT CONTRAST CT CERVICAL SPINE WITHOUT CONTRAST TECHNIQUE: Multidetector CT imaging of the head and cervical spine was performed following the standard protocol without intravenous contrast. Multiplanar CT image reconstructions of the cervical spine were also generated. COMPARISON:  PET CT 01/13/2021 FINDINGS: CT HEAD FINDINGS Brain: Cerebral ventricle sizes are concordant with the degree of cerebral volume loss. Patchy and confluent areas of decreased attenuation are noted throughout the deep and periventricular white matter of the cerebral hemispheres bilaterally, compatible with chronic microvascular ischemic disease. No evidence of large-territorial acute infarction. No parenchymal hemorrhage. No mass lesion. No extra-axial collection. No mass effect or midline shift. No hydrocephalus. Basilar cisterns are patent. Vascular: No hyperdense vessel. Skull: No acute fracture or focal lesion. Sinuses/Orbits: Paranasal sinuses and mastoid air cells are clear. The orbits are unremarkable. Other: Secretions within the naso-oropharynx. CT CERVICAL SPINE FINDINGS Alignment: Normal. Skull base and vertebrae: Densely sclerotic lesion within the right C2 articular facet and C2 vertebral body (3:29) that likely represent bone islands and are not noted to be hypermetabolic on the PET CT 28/76/8115. No acute fracture. No aggressive appearing focal osseous lesion or focal pathologic process. Soft tissues and spinal canal: No  prevertebral fluid or swelling. No visible canal hematoma. Upper chest: Unremarkable. Other: Partially visualized endotracheal tube within the trachea and enteric tube within the esophagus. Bilateral, left greater than right, mandibular lytic and blastic lesions (6:5, 9). IMPRESSION: 1. No acute intracranial abnormality. 2. No acute displaced fracture or traumatic listhesis of the cervical spine. 3. Indeterminate bilateral, left greater than right, mandibular lytic and blastic lesions. Recommend nonemergent CT max face for further evaluation. Electronically Signed   By: Iven Finn M.D.   On: 03/24/2021 20:36   CT Cervical Spine Wo Contrast  Result Date: 03/17/2021 CLINICAL DATA:  Mental status change.  Unknown cause.  Syncope. EXAM: CT HEAD WITHOUT CONTRAST CT CERVICAL SPINE WITHOUT CONTRAST TECHNIQUE: Multidetector CT imaging of the head and cervical spine was performed following the standard protocol without intravenous contrast. Multiplanar CT image reconstructions of the cervical spine were also generated. COMPARISON:  PET CT 01/13/2021 FINDINGS: CT HEAD FINDINGS Brain: Cerebral ventricle sizes are concordant with the degree of cerebral volume loss. Patchy and confluent areas of decreased attenuation are noted throughout the deep and periventricular white matter of the cerebral hemispheres bilaterally, compatible with chronic microvascular ischemic disease. No evidence of large-territorial acute infarction. No parenchymal hemorrhage. No mass lesion. No extra-axial collection. No mass effect or midline shift. No hydrocephalus. Basilar cisterns are patent. Vascular: No hyperdense vessel. Skull: No acute fracture or focal lesion. Sinuses/Orbits: Paranasal sinuses and mastoid air cells are clear. The orbits are unremarkable. Other: Secretions within the naso-oropharynx. CT CERVICAL SPINE FINDINGS Alignment: Normal. Skull base and vertebrae: Densely sclerotic lesion within the right C2 articular facet and C2  vertebral body (3:29) that likely represent bone islands and are not noted to be hypermetabolic on the PET CT 72/62/0355. No acute fracture. No aggressive appearing focal osseous lesion or  focal pathologic process. Soft tissues and spinal canal: No prevertebral fluid or swelling. No visible canal hematoma. Upper chest: Unremarkable. Other: Partially visualized endotracheal tube within the trachea and enteric tube within the esophagus. Bilateral, left greater than right, mandibular lytic and blastic lesions (6:5, 9). IMPRESSION: 1. No acute intracranial abnormality. 2. No acute displaced fracture or traumatic listhesis of the cervical spine. 3. Indeterminate bilateral, left greater than right, mandibular lytic and blastic lesions. Recommend nonemergent CT max face for further evaluation. Electronically Signed   By: Iven Finn M.D.   On: 03/26/2021 20:36   DG Chest Port 1 View  Result Date: 04/01/2021 CLINICAL DATA:  Hypoxia.  History of breast carcinoma.  Recent CPR EXAM: PORTABLE CHEST 1 VIEW COMPARISON:  Chest radiograph Mar 22, 2021; PET-CT January 13, 2021 FINDINGS: Endotracheal tube tip is 3.1 cm above the carina. Nasogastric tube tip and side port in stomach. Port-A-Cath tip is at the cavoatrial junction. No pneumothorax. The lungs are clear. The heart size and pulmonary vascular normal. No appreciable adenopathy. There are surgical clips in each axillary region. No acute fracture evident. IMPRESSION: Tube and catheter positions as described. No pneumothorax. No edema or airspace opacity. Heart size within normal limits. Electronically Signed   By: Lowella Grip III M.D.   On: 03/07/2021 19:35   CT CHEST ABDOMEN PELVIS WO CONTRAST  Result Date: 03/21/2021 CLINICAL DATA:  Syncope pulseless apnea EXAM: CT CHEST, ABDOMEN AND PELVIS WITHOUT CONTRAST TECHNIQUE: Multidetector CT imaging of the chest, abdomen and pelvis was performed following the standard protocol without IV contrast. COMPARISON:   Ultrasound 02/26/2021, PET CT 01/13/2021, CT 03/20/2020 FINDINGS: CT CHEST FINDINGS Cardiovascular: Limited evaluation without intravenous contrast. Nonaneurysmal aorta. Coronary vascular calcification. Normal cardiac size. Trace pericardial effusion. Right-sided central venous port tip at the cavoatrial region. Mediastinum/Nodes: Midline trachea. No thyroid mass. Endotracheal tube tip several cm superior to carina. No suspicious adenopathy. Esophageal tube in the esophagus. Lungs/Pleura: No pleural effusion or pneumothorax. Partial consolidations within the posterior right upper lobe and the bilateral lower lobes. Musculoskeletal: Possible nondisplaced fracture involving the upper body of the sternum. Minimal superior endplate deformity A12. Faint foci of sclerosis T11 and T3 without change. CT ABDOMEN PELVIS FINDINGS Hepatobiliary: Hypodense liver masses vaguely visualized corresponding to history of metastatic disease. Liver is enlarged measuring 19 cm craniocaudad. Hyperdense sludge or stones in the gallbladder. The gallbladder wall appears diffusely thickened. No biliary dilatation Pancreas: Mild peripancreatic edema.  No ductal dilatation Spleen: Normal in size without focal abnormality. Adrenals/Urinary Tract: Adrenal glands are normal. Kidneys show no hydronephrosis. Foley catheter in the bladder Stomach/Bowel: Esophageal tube tip in the body of the stomach. Decompressed stomach. No dilated small bowel. Negative appendix. Mild fluid in the colon. Possible mild wall thickening at the hepatic flexure. Vascular/Lymphatic: Mild aortic atherosclerosis. No aneurysm. No suspicious nodes Reproductive: Right uterine fundal hyperdensity possibly due to fibroid. No adnexal mass Other: No free air. Small free fluid in the pelvis. Small perihepatic and perisplenic ascites. Musculoskeletal: No acute or suspicious osseous abnormality. IMPRESSION: 1. Partial consolidations within the posterior right upper lobe as well as  the posteromedial lower lobes, suspicious for aspiration. 2. Indistinct appearance of pancreas with possible peripancreatic edema as may be seen with pancreatitis. Correlate with appropriate enzymes. 3. Enlarged liver with multiple masses consistent with hepatic metastatic disease. Small amount of ascites within the abdomen and pelvis. 4. Sludge within the gallbladder. Gallbladder wall appears diffusely thickened which is a nonspecific finding and may be correlated with ultrasound as  indicated. 5. Fluid within the colon. Possible mild wall thickening at hepatic flexure as may be seen with a colitis. Electronically Signed   By: Donavan Foil M.D.   On: 03/08/2021 20:46    Procedures Procedure Name: Intubation Date/Time: 03/18/2021 7:27 PM Performed by: Suzan Nailer, DO Pre-anesthesia Checklist: Emergency Drugs available and Patient identified Oxygen Delivery Method: Ambu bag Preoxygenation: Pre-oxygenation with 100% oxygen Induction Type: Rapid sequence Ventilation: Nasal airway inserted- appropriate to patient size Laryngoscope Size: Mac and 4 Grade View: Grade II Tube size: 7.5 mm Number of attempts: 2 Airway Equipment and Method: Rigid stylet Placement Confirmation: ETT inserted through vocal cords under direct vision Secured at: 22 cm Tube secured with: ETT holder Dental Injury: Teeth and Oropharynx as per pre-operative assessment  Difficulty Due To: Difficult Airway- due to anterior larynx    ARTERIAL LINE  Date/Time: 03/05/2021 7:28 PM Performed by: Suzan Nailer, DO Authorized by: Drenda Freeze, MD   Consent:    Consent obtained:  Emergent situation Universal protocol:    Patient identity confirmed:  Verbally with patient Indications:    Indications: hemodynamic monitoring and multiple ABGs   Pre-procedure details:    Skin preparation:  Chlorhexidine   Preparation: Patient was prepped and draped in sterile fashion   Sedation:    Sedation type:   None Anesthesia:    Anesthesia method:  None Procedure details:    Location:  L radial   Needle gauge:  22 G   Placement technique:  Seldinger   Number of attempts:  1   Transducer: waveform confirmed   Post-procedure details:    Post-procedure:  Sterile dressing applied   CMS:  Unchanged   Procedure completion:  Tolerated well, no immediate complications     Medications Ordered in ED Medications  lactated ringers infusion ( Intravenous IV Pump Association 03/05/2021 2230)  propofol (DIPRIVAN) 1000 MG/100ML infusion (  Not Given 03/24/2021 1857)  fentaNYL 2528mg in NS 2538m(1043mml) infusion-PREMIX (50 mcg/hr Intravenous IV Pump Association 03/26/2021 2230)  fentaNYL (SUBLIMAZE) bolus via infusion 50 mcg (has no administration in time range)  midazolam (VERSED) injection 2 mg (has no administration in time range)  midazolam (VERSED) injection 2 mg (has no administration in time range)  norepinephrine (LEVOPHED) 4-5 MG/250ML-% infusion SOLN (0 mcg/kg/min  Stopped 03/16/2021 2303)  docusate sodium (COLACE) capsule 100 mg (has no administration in time range)  polyethylene glycol (MIRALAX / GLYCOLAX) packet 17 g (has no administration in time range)  insulin aspart (novoLOG) injection 0-6 Units (1 Units Subcutaneous Given 03/04/2021 2315)  metroNIDAZOLE (FLAGYL) IVPB 500 mg (500 mg Intravenous New Bag/Given 03/24/2021 2308)  ipratropium-albuterol (DUONEB) 0.5-2.5 (3) MG/3ML nebulizer solution 3 mL (has no administration in time range)  norepinephrine (LEVOPHED) 4mg46m 250mL33mmix infusion (35 mcg/min Intravenous New Bag/Given 03/02/2021 2230)  sodium bicarbonate injection (50 mEq Intravenous Given 03/16/2021 1816)  etomidate (AMIDATE) injection (20 mg Intravenous Given 03/16/2021 1817)  rocuronium (ZEMURON) injection (100 mg Intravenous Given 03/05/2021 1818)  sodium chloride 0.9 % bolus 1,000 mL (0 mLs Intravenous Stopped 03/23/2021 1912)  norepinephrine (LEVOPHED) 4mg i45m50mL p56mx infusion ( Intravenous  Infusion Verify 03/13/2021 2144)  calcium gluconate 1 g/ 50 mL sodium chloride IVPB (0 g Intravenous Stopped 03/05/2021 2118)  sodium bicarbonate injection 50 mEq (50 mEq Intravenous Given 03/20/2021 1921)  sodium zirconium cyclosilicate (LOKELMA) packet 10 g (10 g Per Tube Given 03/05/2021 1950)    ED Course  I have reviewed the triage vital signs and the nursing notes.  Pertinent labs & imaging results that were available during my care of the patient were reviewed by me and considered in my medical decision making (see chart for details).    MDM Rules/Calculators/A&P                          64 year old female with history of metastatic breast cancer presents the emergency department for syncopal episode and cardiac arrest.  ROSC obtained.  Upon arrival, does have bilateral breath sounds.  Is not protecting her airway.  Blood pressure confirmed to be hypertensive.  Fluids started.  Patient given bicarbonate and calcium due to concern for hyperkalemia based on prehospital EKG with widened QRS.  Left EJ IV placed by myself, nursing staff accessed right chest port.  Confirmed with EMS and chart that patient has not had history of DNR CODE STATUS.  7.5 ET tube placed for airway protection.   After intubation, patient had slowly downtrending blood pressures.  Levophed drip started at 10 and quickly up trended to 30 through Port-A-Cath.  Fentanyl infusion hung for sedation.  ET tube confirmed by chest x-ray.  I-STAT CHEM panel came back concerning for hyperkalemia.  Initial dose of bicarb and calcium given.  A line placed at bedside for better monitoring of hemodynamic status by myself.  Will order imaging from head to pelvis.  We will also order labs following cardiac arrest.  Daughter updated in consultation room.  CT chest abdomen and pelvis shows likely aspiration.  Also shows metastatic disease and gallbladder wall thickening.  Possible colitis.  Head without acute bleed or stroke.  Labs show  hyperkalemia, anemia, leukocytosis, bilirubinemia, hyponatremia, hyperglycemia, anion gap acidosis, elevation of alkaline phosphatase and liver enzymes.  Creatinine is elevated, uncertain of baseline.  Troponin 139.  Suspect hyperkalemia as cause of cardiac arrest.  Lokelma given via OG.  Patient is critically ill and requires monitoring and treatment in ICU.  Admitted with no acute change in status.  Final Clinical Impression(s) / ED Diagnoses Final diagnoses:  Cardiac arrest [facility])  Elevated LFTs    Rx / DC Orders ED Discharge Orders    None       Suzan Nailer, DO 03/24/2021 2346    Drenda Freeze, MD 03/18/21 [phone]

## 2021-03-14 NOTE — ED Notes (Signed)
Pt returned from CT °

## 2021-03-14 NOTE — ED Notes (Signed)
Pt transported to CT w/ RN and RT

## 2021-03-15 ENCOUNTER — Inpatient Hospital Stay (HOSPITAL_COMMUNITY): Payer: BC Managed Care – PPO

## 2021-03-15 DIAGNOSIS — Z515 Encounter for palliative care: Secondary | ICD-10-CM

## 2021-03-15 DIAGNOSIS — Z7189 Other specified counseling: Secondary | ICD-10-CM

## 2021-03-15 DIAGNOSIS — Z66 Do not resuscitate: Secondary | ICD-10-CM | POA: Diagnosis not present

## 2021-03-15 LAB — BASIC METABOLIC PANEL
Anion gap: 24 — ABNORMAL HIGH (ref 5–15)
BUN: 52 mg/dL — ABNORMAL HIGH (ref 8–23)
CO2: 11 mmol/L — ABNORMAL LOW (ref 22–32)
Calcium: 7.1 mg/dL — ABNORMAL LOW (ref 8.9–10.3)
Chloride: 89 mmol/L — ABNORMAL LOW (ref 98–111)
Creatinine, Ser: 1.6 mg/dL — ABNORMAL HIGH (ref 0.44–1.00)
GFR, Estimated: 36 mL/min — ABNORMAL LOW (ref >60)
Glucose, Bld: 77 mg/dL (ref 70–99)
Potassium: 4.4 mmol/L (ref 3.5–5.1)
Sodium: 124 mmol/L — ABNORMAL LOW (ref 135–145)

## 2021-03-15 LAB — GLUCOSE, CAPILLARY
Glucose-Capillary: 109 mg/dL — ABNORMAL HIGH (ref 70–99)
Glucose-Capillary: 202 mg/dL — ABNORMAL HIGH (ref 70–99)
Glucose-Capillary: 41 mg/dL — CL (ref 70–99)
Glucose-Capillary: 130 mg/dL — ABNORMAL HIGH (ref 70–99)

## 2021-03-15 LAB — POCT I-STAT 7, (LYTES, BLD GAS, ICA,H+H)
Acid-base deficit: 13 mmol/L — ABNORMAL HIGH (ref 0.0–2.0)
Acid-base deficit: 12 mmol/L — ABNORMAL HIGH (ref 0.0–2.0)
Bicarbonate: 11.9 mmol/L — ABNORMAL LOW (ref 20.0–28.0)
Bicarbonate: 11.6 mmol/L — ABNORMAL LOW (ref 20.0–28.0)
Calcium, Ion: 0.88 mmol/L — CL (ref 1.15–1.40)
Calcium, Ion: 0.83 mmol/L — CL (ref 1.15–1.40)
HCT: 26 % — ABNORMAL LOW (ref 36.0–46.0)
HCT: 29 % — ABNORMAL LOW (ref 36.0–46.0)
Hemoglobin: 8.8 g/dL — ABNORMAL LOW (ref 12.0–15.0)
Hemoglobin: 9.9 g/dL — ABNORMAL LOW (ref 12.0–15.0)
O2 Saturation: 97 %
O2 Saturation: 98 %
Patient temperature: 98.7
Patient temperature: 34.4
Potassium: 5.2 mmol/L — ABNORMAL HIGH (ref 3.5–5.1)
Potassium: 5.1 mmol/L (ref 3.5–5.1)
Sodium: 123 mmol/L — ABNORMAL LOW (ref 135–145)
Sodium: 123 mmol/L — ABNORMAL LOW (ref 135–145)
TCO2: 13 mmol/L — ABNORMAL LOW (ref 22–32)
TCO2: 12 mmol/L — ABNORMAL LOW (ref 22–32)
pCO2 arterial: 25.2 mmHg — ABNORMAL LOW (ref 32.0–48.0)
pCO2 arterial: 18.6 mmHg — CL (ref 32.0–48.0)
pH, Arterial: 7.282 — ABNORMAL LOW (ref 7.350–7.450)
pH, Arterial: 7.389 (ref 7.350–7.450)
pO2, Arterial: 100 mmHg (ref 83.0–108.0)
pO2, Arterial: 97 mmHg (ref 83.0–108.0)

## 2021-03-15 LAB — HEPATIC FUNCTION PANEL
ALT: 584 U/L — ABNORMAL HIGH (ref 0–44)
AST: 3656 U/L — ABNORMAL HIGH (ref 15–41)
Albumin: 1.3 g/dL — ABNORMAL LOW (ref 3.5–5.0)
Alkaline Phosphatase: 2042 U/L — ABNORMAL HIGH (ref 38–126)
Bilirubin, Direct: 7.9 mg/dL — ABNORMAL HIGH (ref 0.0–0.2)
Indirect Bilirubin: 5 mg/dL — ABNORMAL HIGH (ref 0.3–0.9)
Total Bilirubin: 12.9 mg/dL — ABNORMAL HIGH (ref 0.3–1.2)
Total Protein: 4.3 g/dL — ABNORMAL LOW (ref 6.5–8.1)

## 2021-03-15 LAB — CBC
HCT: 26.1 % — ABNORMAL LOW (ref 36.0–46.0)
HCT: 24 % — ABNORMAL LOW (ref 36.0–46.0)
Hemoglobin: 8.8 g/dL — ABNORMAL LOW (ref 12.0–15.0)
Hemoglobin: 8.3 g/dL — ABNORMAL LOW (ref 12.0–15.0)
MCH: 35.3 pg — ABNORMAL HIGH (ref 26.0–34.0)
MCH: 35.6 pg — ABNORMAL HIGH (ref 26.0–34.0)
MCHC: 33.7 g/dL (ref 30.0–36.0)
MCHC: 34.6 g/dL (ref 30.0–36.0)
MCV: 104.8 fL — ABNORMAL HIGH (ref 80.0–100.0)
MCV: 103 fL — ABNORMAL HIGH (ref 80.0–100.0)
Platelets: 21 10*3/uL — CL (ref 150–400)
Platelets: 18 10*3/uL — CL (ref 150–400)
RBC: 2.49 MIL/uL — ABNORMAL LOW (ref 3.87–5.11)
RBC: 2.33 MIL/uL — ABNORMAL LOW (ref 3.87–5.11)
RDW: 19 % — ABNORMAL HIGH (ref 11.5–15.5)
RDW: 19.9 % — ABNORMAL HIGH (ref 11.5–15.5)
WBC: 18.2 10*3/uL — ABNORMAL HIGH (ref 4.0–10.5)
WBC: 18 10*3/uL — ABNORMAL HIGH (ref 4.0–10.5)
nRBC: 24.5 % — ABNORMAL HIGH (ref 0.0–0.2)
nRBC: 24.2 % — ABNORMAL HIGH (ref 0.0–0.2)

## 2021-03-15 LAB — URINALYSIS, ROUTINE W REFLEX MICROSCOPIC
Glucose, UA: 50 mg/dL — AB
Ketones, ur: 5 mg/dL — AB
Leukocytes,Ua: NEGATIVE
Nitrite: NEGATIVE
Protein, ur: 100 mg/dL — AB
Specific Gravity, Urine: 1.015 (ref 1.005–1.030)
pH: 6 (ref 5.0–8.0)

## 2021-03-15 LAB — MRSA PCR SCREENING: MRSA by PCR: NEGATIVE

## 2021-03-15 LAB — MAGNESIUM: Magnesium: 2.8 mg/dL — ABNORMAL HIGH (ref 1.7–2.4)

## 2021-03-15 LAB — SARS CORONAVIRUS 2 (TAT 6-24 HRS): SARS Coronavirus 2: NEGATIVE

## 2021-03-15 LAB — PHOSPHORUS: Phosphorus: 9.8 mg/dL — ABNORMAL HIGH (ref 2.5–4.6)

## 2021-03-15 LAB — LACTIC ACID, PLASMA: Lactic Acid, Venous: >11 mmol/L (ref 0.5–1.9)

## 2021-03-15 LAB — HIV ANTIBODY (ROUTINE TESTING W REFLEX): HIV Screen 4th Generation wRfx: REACTIVE — AB

## 2021-03-15 MED ORDER — GLYCOPYRROLATE 1 MG PO TABS
1.0000 mg | ORAL_TABLET | ORAL | Status: DC | PRN
  Filled 2021-03-15: qty 1

## 2021-03-15 MED ORDER — DEXTROSE 50 % IV SOLN
INTRAVENOUS | Status: AC
  Administered 2021-03-15: 50 mL
  Filled 2021-03-15: qty 50

## 2021-03-15 MED ORDER — ACETAMINOPHEN 650 MG RE SUPP: 650.0000 mg | Freq: Four times a day (QID) | RECTAL | Status: DC | PRN

## 2021-03-15 MED ORDER — GLYCOPYRROLATE 0.2 MG/ML IJ SOLN: 0.2000 mg | INTRAMUSCULAR | Status: DC | PRN

## 2021-03-15 MED ORDER — STERILE WATER FOR INJECTION IV SOLN
INTRAVENOUS | Status: DC
  Filled 2021-03-15 (×3): qty 1000

## 2021-03-15 MED ORDER — MORPHINE SULFATE (PF) 2 MG/ML IV SOLN
2.0000 mg | INTRAVENOUS | Status: DC | PRN
Start: 2021-03-15 — End: 2021-03-16

## 2021-03-15 MED ORDER — SODIUM ZIRCONIUM CYCLOSILICATE 10 G PO PACK
10.0000 g | PACK | Freq: Once | ORAL | Status: AC
  Administered 2021-03-15: 10 g
  Filled 2021-03-15: qty 1

## 2021-03-15 MED ORDER — POLYVINYL ALCOHOL 1.4 % OP SOLN
1.0000 [drp] | Freq: Four times a day (QID) | OPHTHALMIC | Status: DC | PRN
  Filled 2021-03-15: qty 15

## 2021-03-15 MED ORDER — ORAL CARE MOUTH RINSE
15.0000 mL | OROMUCOSAL | Status: DC
  Administered 2021-03-15 (×4): 15 mL via OROMUCOSAL

## 2021-03-15 MED ORDER — CHLORHEXIDINE GLUCONATE CLOTH 2 % EX PADS
6.0000 | MEDICATED_PAD | Freq: Every day | CUTANEOUS | Status: DC
  Administered 2021-03-15: 6 via TOPICAL

## 2021-03-15 MED ORDER — INSULIN ASPART 100 UNIT/ML IV SOLN
10.0000 [IU] | Freq: Once | INTRAVENOUS | Status: AC
  Administered 2021-03-15: 10 [IU] via INTRAVENOUS

## 2021-03-15 MED ORDER — DIPHENHYDRAMINE HCL 50 MG/ML IJ SOLN: 25.0000 mg | INTRAMUSCULAR | Status: DC | PRN

## 2021-03-15 MED ORDER — DEXTROSE 5 % IV SOLN: INTRAVENOUS | Status: DC

## 2021-03-15 MED ORDER — ACETAMINOPHEN 325 MG PO TABS: 650.0000 mg | ORAL_TABLET | Freq: Four times a day (QID) | ORAL | Status: DC | PRN

## 2021-03-15 MED ORDER — DEXTROSE 50 % IV SOLN
1.0000 | Freq: Once | INTRAVENOUS | Status: AC
  Administered 2021-03-15: 50 mL via INTRAVENOUS
  Filled 2021-03-15: qty 50

## 2021-03-15 MED ORDER — PANTOPRAZOLE SODIUM 40 MG IV SOLR: 40.0000 mg | INTRAVENOUS | Status: DC

## 2021-03-15 MED ORDER — CALCIUM GLUCONATE-NACL 1-0.675 GM/50ML-% IV SOLN
1.0000 g | Freq: Once | INTRAVENOUS | Status: AC
  Administered 2021-03-15: 1000 mg via INTRAVENOUS
  Filled 2021-03-15: qty 50

## 2021-03-15 MED ORDER — PIPERACILLIN-TAZOBACTAM 3.375 G IVPB
3.3750 g | Freq: Three times a day (TID) | INTRAVENOUS | Status: DC
  Administered 2021-03-15: 3.375 g via INTRAVENOUS
  Filled 2021-03-15: qty 50

## 2021-03-15 MED ORDER — CHLORHEXIDINE GLUCONATE 0.12% ORAL RINSE (MEDLINE KIT)
15.0000 mL | Freq: Two times a day (BID) | OROMUCOSAL | Status: DC
  Administered 2021-03-15: 15 mL via OROMUCOSAL

## 2021-03-15 MED ORDER — PANTOPRAZOLE SODIUM 40 MG IV SOLR
40.0000 mg | Freq: Once | INTRAVENOUS | Status: AC
  Administered 2021-03-15: 40 mg via INTRAVENOUS
  Filled 2021-03-15: qty 40

## 2021-03-15 MED ORDER — VASOPRESSIN 20 UNITS/100 ML INFUSION FOR SHOCK
0.0000 [IU]/min | INTRAVENOUS | Status: DC
  Administered 2021-03-15: 0.03 [IU]/min via INTRAVENOUS
  Filled 2021-03-15: qty 100

## 2021-03-15 MED ORDER — MIDAZOLAM HCL 2 MG/2ML IJ SOLN: 2.0000 mg | INTRAMUSCULAR | Status: DC | PRN

## 2021-03-15 MED ORDER — MORPHINE BOLUS VIA INFUSION
5.0000 mg | INTRAVENOUS | Status: DC | PRN
  Filled 2021-03-15: qty 5

## 2021-03-15 MED ORDER — NOREPINEPHRINE 16 MG/250ML-% IV SOLN
0.0000 ug/min | INTRAVENOUS | Status: DC
  Administered 2021-03-15 (×2): 40 ug/min via INTRAVENOUS
  Administered 2021-03-15: 45 ug/min via INTRAVENOUS
  Filled 2021-03-15 (×3): qty 250

## 2021-03-15 MED ORDER — MORPHINE 100MG IN NS 100ML (1MG/ML) PREMIX INFUSION
0.0000 mg/h | INTRAVENOUS | Status: DC
  Administered 2021-03-15: 5 mg/h via INTRAVENOUS
  Filled 2021-03-15: qty 100

## 2021-03-15 MED ORDER — SODIUM CHLORIDE 0.9 % IV BOLUS
500.0000 mL | Freq: Once | INTRAVENOUS | Status: AC
  Administered 2021-03-15: 500 mL via INTRAVENOUS

## 2021-03-15 MED ORDER — SODIUM BICARBONATE 8.4 % IV SOLN
100.0000 meq | Freq: Once | INTRAVENOUS | Status: AC
  Administered 2021-03-15: 100 meq via INTRAVENOUS
  Filled 2021-03-15: qty 100

## 2021-03-17 ENCOUNTER — Inpatient Hospital Stay: Payer: BC Managed Care – PPO

## 2021-03-17 ENCOUNTER — Inpatient Hospital Stay: Payer: BC Managed Care – PPO | Admitting: Hematology

## 2021-03-17 LAB — HIV-1/2 AB - DIFFERENTIATION
HIV 1 Ab: REACTIVE — AB
HIV 2 Ab: UNDETERMINED

## 2021-03-17 LAB — POCT I-STAT 7, (LYTES, BLD GAS, ICA,H+H)
Acid-base deficit: 17 mmol/L — ABNORMAL HIGH (ref 0.0–2.0)
Bicarbonate: 10.3 mmol/L — ABNORMAL LOW (ref 20.0–28.0)
Calcium, Ion: 0.82 mmol/L — CL (ref 1.15–1.40)
HCT: 29 % — ABNORMAL LOW (ref 36.0–46.0)
Hemoglobin: 9.9 g/dL — ABNORMAL LOW (ref 12.0–15.0)
O2 Saturation: 98 %
Patient temperature: 98.7
Potassium: 8.2 mmol/L (ref 3.5–5.1)
Sodium: 117 mmol/L — CL (ref 135–145)
TCO2: 11 mmol/L — ABNORMAL LOW (ref 22–32)
pCO2 arterial: 28.9 mmHg — ABNORMAL LOW (ref 32.0–48.0)
pH, Arterial: 7.161 — CL (ref 7.350–7.450)
pO2, Arterial: 133 mmHg — ABNORMAL HIGH (ref 83.0–108.0)

## 2021-03-17 LAB — PATHOLOGIST SMEAR REVIEW

## 2021-03-18 ENCOUNTER — Telehealth: Payer: Self-pay | Admitting: *Deleted

## 2021-03-18 DIAGNOSIS — N179 Acute kidney failure, unspecified: Secondary | ICD-10-CM | POA: Diagnosis present

## 2021-03-18 DIAGNOSIS — J9601 Acute respiratory failure with hypoxia: Secondary | ICD-10-CM | POA: Diagnosis present

## 2021-03-18 DIAGNOSIS — K7200 Acute and subacute hepatic failure without coma: Secondary | ICD-10-CM | POA: Diagnosis present

## 2021-03-18 DIAGNOSIS — C50919 Malignant neoplasm of unspecified site of unspecified female breast: Secondary | ICD-10-CM | POA: Diagnosis present

## 2021-03-18 NOTE — Telephone Encounter (Signed)
Notified by Dr Baxter Flattery of patient's passing; relayed information to [facility]. Confirmed all upcoming appointments are cancelled.  Landis Gandy, RN

## 2021-03-19 ENCOUNTER — Ambulatory Visit: Payer: BC Managed Care – PPO

## 2021-03-19 LAB — CULTURE, BLOOD (SINGLE)
Culture: NO GROWTH
Special Requests: ADEQUATE

## 2021-03-19 LAB — HIV-1/HIV-2 QUAL RNA
HIV-1 RNA, Qualitative: REACTIVE — AB
HIV-2 RNA, Qualitative: NONREACTIVE

## 2021-03-20 LAB — CULTURE, BLOOD (ROUTINE X 2)
Culture: NO GROWTH
Culture: NO GROWTH

## 2021-03-24 ENCOUNTER — Ambulatory Visit: Payer: BC Managed Care – PPO | Admitting: Internal Medicine

## 2021-04-02 NOTE — Progress Notes (Signed)
NAME:  Victoria Coffey, MRN:  [id number], DOB:  10-09-1956, LOS: 1 ADMISSION DATE:  03/24/2021, CONSULTATION DATE: 03/15/2019 REFERRING MD: Emergency department, CHIEF COMPLAINT: Back arrest  History of Present Illness:  65 year old lady, past medical history of hypertension, recurrent breast cancer with metastasis to the liver.  First diagnosed in 1993.  Initially treated with lumpectomy, chemotherapy and radiation.  Patient found to have recurrence of disease in April 2021 again treated with chemotherapy and XRT.  Last treatment was 4 weeks ago.  Patient found down in the bathroom.  Syncopal event was unresponsive.  Daughter started CPR.  At 8 minutes of chest compressions while EMS arrived.  Additional 20 minutes of chest compressions.  CT scan of the chest consistent with aspiration on arrival patient was intubated placed on life support vasopressors CT imaging confirmed liver metastasis fluid within the gallbladder and diffuse colitis.  Pertinent  Medical History  Aortic sclerosis, breast cancer, fatty liver, HIV infection, hypertension  MEDICAL HISTORY:      Past Medical History:  Diagnosis Date  . Aortic atherosclerosis ([place])   . Breast cancer ([place]) dx'd 1993   left  . Fatty liver   . History of left breast cancer   . HIV infection ([place])   . Hypertension   . Port-A-Cath in place     SURGICAL HISTORY:      Past Surgical History:  Procedure Laterality Date  . AXILLARY LYMPH NODE DISSECTION Right 10/14/2020   Procedure: left axilla removal of two drains right axillary lymph node dissection;  Surgeon: [name], MD;  Location: [facility];  Service: General;  Laterality: Right;  PEC BLOCK, RNFA OR PA  . BREAST BIOPSY Bilateral 03/22/2020   Procedure: BILATERAL BREAST PUNCH BIOPSIES;  Surgeon: [name], MD;  Location: [facility];  Service: General;  Laterality: Bilateral;  . BREAST LUMPECTOMY    . CESAREAN SECTION    . left breast lumpectomy  1993  . MASTECTOMY W/  SENTINEL NODE BIOPSY Bilateral 10/01/2020  . MASTECTOMY W/ SENTINEL NODE BIOPSY Bilateral 10/01/2020   Procedure: BILATERAL MASTECTOMIES WITH RIGHT SENTINEL LYMPH NODE BIOPSIES,RIGHT TARGETED RADIOACTIVE SEED LYMPH NODE, LEFT BREAST MODIFIED RADICAL MASTECTOMY ;  Surgeon: [name], MD;  Location: [facility];  Service: General;  Laterality: Bilateral;  PEC BLOCK, RNFA  . PORTACATH PLACEMENT Right 03/22/2020   Procedure: INSERTION PORT-A-CATH WITH ULTRASOUND GUIDANCE;  Surgeon: [name], MD;  Location: [facility];  Service: General;  Laterality: Right;  . RADIOACTIVE SEED GUIDED AXILLARY SENTINEL LYMPH NODE Right 10/01/2020   Procedure: RADIOACTIVE SEED GUIDED  TARGETED RIGHT SENTINEL LYMPH NODE BIOPSY;  Surgeon: [name], MD;  Location: [facility];  Service: General;  Laterality: Right;  PEC BLOCK, RNFA     Significant Hospital Events: Including procedures, antibiotic start and stop dates in addition to other pertinent events   . Cardiac arrest  Interim History / Subjective:  Critically ill intubated on life support  Objective   Blood pressure 132/79, pulse 99, temperature (!) 93.92 F (34.4 C), temperature source Esophageal, resp. rate (!) 31, height '5\' 2"'  (1.575 m), weight 56.4 kg, SpO2 100 %.    Vent Mode: PRVC FiO2 (%):  [40 %-100 %] 40 % Set Rate:  [18 bmp-21 bmp] 21 bmp Vt Set:  [400 mL] 400 mL PEEP:  [5 cmH20] 5 cmH20 Plateau Pressure:  [22 cmH20-25 cmH20] 25 cmH20   Intake/Output Summary (Last 24 hours) at Apr 06, 2021 0921 Last data filed at 2021/04/06 0800 Gross per 24 hour  Intake 4042.36  ml  Output 515 ml  Net 3527.36 ml   Filed Weights   03/10/2021 2230 Apr 01, 2021 0600  Weight: 56.4 kg 56.4 kg    Examination: General: Female, intubated mechanical life support critically ill HENT: Tracking appropriately, endotracheal tube in place Lungs: Bilateral mechanically ventilated breath sounds Cardiovascular: Regular rate rhythm, S1-S2 Abdomen: Mildly distended no tenderness  to palpation Extremities: Moves all 4 extremities Neuro: Alert on mechanical support able to nod head yes and no GU: Deferred  Labs/imaging that I havepersonally reviewed  (right click and "Reselect all SmartList Selections" daily)  Sodium 124 Bicarb 11 BUN 52 Serum creatinine 1.6 Alk phos 2000 AST 3600 Lactate greater than 11  White blood cell count 18.2.  Resolved Hospital Problem list     Assessment & Plan:   Cardiac arrest possibly due to hyperkalemia Metastatic breast cancer, end-stage, oncology recommended outpatient palliative care and DNR status Acute liver injury, jaundice likely from metastatic breast, liver mets Septic shock, diffuse colitis, aspiration pneumonia postarrest Acute kidney injury, acute renal failure secondary to above Do not think she is had a acute coronary syndrome Acute hypoxemic respiratory failure requiring intubation mechanical ventilation secondary to above HIV infection  Plan: Remains on adult mechanical vent support Sedation with PAD guidelines Check uric acid and LDH Check right upper quadrant ultrasound Continue antibiotics, pharmacy consult for Zosyn Continue norepinephrine and vasopressin to maintain mean arterial pressure greater than 65 mmHg Multiple medical comorbidities Will need pharmacy to help restart HIV meds.    Best practice (right click and "Reselect all SmartList Selections" daily)  Diet:  NPO Pain/Anxiety/Delirium protocol (if indicated): Yes (RASS goal -1) VAP protocol (if indicated): Yes DVT prophylaxis: Subcutaneous Heparin GI prophylaxis: H2B Glucose control:  SSI Yes Central venous access:  N/A Arterial line:  Yes, and it is still needed Foley:  Yes, and it is still needed Mobility:  bed rest  PT consulted: N/A Last date of multidisciplinary goals of care discussion [pending] Code Status:  full code Disposition: ICU   Labs   CBC: Recent Labs  Lab 03/29/2021 1824 03/05/2021 1828 03/24/2021 1938  Apr 01, 2021 0123 01-Apr-2021 0406 04/01/2021 0530  WBC 12.1*  --   --   --   --  18.2*  NEUTROABS 10.5*  --   --   --   --   --   HGB 8.3* 9.2* 8.8* 9.9* 8.8* 8.8*  HCT 25.5* 27.0* 26.0* 29.0* 26.0* 26.1*  MCV 109.4*  --   --   --   --  104.8*  PLT PLATELET CLUMPS NOTED ON SMEAR, UNABLE TO ESTIMATE  --   --   --   --  21*    Basic Metabolic Panel: Recent Labs  Lab 03/24/2021 1824 03/07/2021 1828 03/06/2021 1938 2021/04/01 0123 Apr 01, 2021 0406 04/01/21 0530  NA 118* 117* 124* 117* 123* 124*  K 6.4* 6.3* 4.8 8.2* 5.2* 4.4  CL 88* 94*  --   --   --  89*  CO2 9*  --   --   --   --  11*  GLUCOSE 156* 143*  --   --   --  77  BUN 57* 65*  --   --   --  52*  CREATININE 1.72* 1.60*  --   --   --  1.60*  CALCIUM 7.7*  --   --   --   --  7.1*  MG  --   --   --   --   --  2.8*  PHOS  --   --   --   --   --  9.8*   GFR: Estimated Creatinine Clearance: 28.1 mL/min (A) (by C-G formula based on SCr of 1.6 mg/dL (H)). Recent Labs  Lab 03/19/2021 1824 03/23/2021 2120 2021-03-20 0530  WBC 12.1*  --  18.2*  LATICACIDVEN  --  10.8* >11.0*    Liver Function Tests: Recent Labs  Lab 03/09/2021 1824 03/20/2021 0530  AST 1,439* 3,656*  ALT 318* 584*  ALKPHOS 2,160* 2,042*  BILITOT 12.0* 12.9*  PROT 4.7* 4.3*  ALBUMIN 1.3* 1.3*   No results for input(s): LIPASE, AMYLASE in the last 168 hours. Recent Labs  Lab 03/02/2021 2118  AMMONIA 106*    ABG    Component Value Date/Time   PHART 7.282 (L) 03/20/21 0406   PCO2ART 25.2 (L) 20-Mar-2021 0406   PO2ART 100 Mar 20, 2021 0406   HCO3 11.9 (L) 2021-03-20 0406   TCO2 13 (L) 2021-03-20 0406   ACIDBASEDEF 13.0 (H) 2021-03-20 0406   O2SAT 97.0 03-20-21 0406     Coagulation Profile: Recent Labs  Lab 03/05/2021 2129  INR 4.2*    Cardiac Enzymes: No results for input(s): CKTOTAL, CKMB, CKMBINDEX, TROPONINI in the last 168 hours.  HbA1C: Hgb A1c MFr Bld  Date/Time Value Ref Range Status  03/03/2021 09:23 PM 4.6 (L) 4.8 - 5.6 % Final    Comment:     (NOTE) Pre diabetes:          5.7%-6.4%  Diabetes:              >6.4%  Glycemic control for   <7.0% adults with diabetes     CBG: Recent Labs  Lab 03/17/2021 2311 03/20/2021 0222 20-Mar-2021 0304 03/20/21 0802 03-20-2021 0845  GLUCAP 155* 109* 202* 41* 130*    Review of Systems:   Unable to obtain   Past Medical History:  She,  has no past medical history on file.   Surgical History:   Previous breast surgeries  Social History:      Family History:  Her family history is not on file.   Allergies Allergies  Allergen Reactions  . Grapefruit Flavor [Flavoring Agent] Hives  . Pomegranate (Punica Granatum) Hives     Home Medications  Prior to Admission medications   Medication Sig Start Date End Date Taking? Authorizing Provider  hydrochlorothiazide (HYDRODIURIL) 25 MG tablet Take 25 mg by mouth daily. 02/18/21  Yes [provider]  ondansetron (ZOFRAN) 8 MG tablet Take 8 mg by mouth every 8 (eight) hours as needed for nausea or vomiting. 03/03/21  Yes [provider]  traMADol (ULTRAM) 50 MG tablet Take 50 mg by mouth 4 (four) times daily as needed for moderate pain. 03/03/21  Yes [provider]      This patient is critically ill with multiple organ system failure; which, requires frequent high complexity decision making, assessment, support, evaluation, and titration of therapies. This was completed through the application of advanced monitoring technologies and extensive interpretation of multiple databases. During this encounter critical care time was devoted to patient care services described in this note for 34 minutes.  Garner Nash, DO [facility] 03/20/2021 9:42 AM

## 2021-04-02 NOTE — Progress Notes (Signed)
eLink Physician-Brief Progress Note Patient Name: Victoria Coffey DOB: 03-05-1956 MRN: [id number]   Date of Service  03/22/2021  HPI/Events of Note  ABG on 50%/PRVC 21/TV 400/P 5 = 7.28/25.2/100/11.9  eICU Interventions  Plan: 1. Continue present ventilator management and NaHCO3 IV infusion. 2. Repeat ABG at 10 AM.     Intervention Category Major Interventions: Acid-Base disturbance - evaluation and management;Respiratory failure - evaluation and management  Lysle Dingwall 03/11/2021, 4:41 AM

## 2021-04-02 NOTE — Progress Notes (Signed)
eLink Physician-Brief Progress Note Patient Name: Victoria Coffey DOB: 12/09/1955 MRN: [id number]   Date of Service  03-21-2021  HPI/Events of Note  Multiple issues: 1. ABG on 50%/PRVC 18/TV 400/P 5 = 7.16/28.9/133/10.3 and 2. Hyperkalemia - K+ = 8.2.  eICU Interventions  Plan: 1. Increase PRVC rate to 21. 2. NaHCO3 100 meq IV X 1 now. 3. NaHCO3 IV infusion to run at 125 mL/hour. 4. Calcium gluconate 1 gm IV now. 5. D50 1 amp IV now. 6. Novolog 10 units IV X 1 now. 7. Lokelma 10 gm per tube now. 8. Repeat already ABG ordered for 5 AM. 9. Repeat BMP at 8 AM.     Intervention Category Major Interventions: Acid-Base disturbance - evaluation and management;Respiratory failure - evaluation and management;Electrolyte abnormality - evaluation and management  [name] Eugene Mar 21, 2021, 2:05 AM

## 2021-04-02 NOTE — Progress Notes (Signed)
eLink Physician-Brief Progress Note Patient Name: Victoria Coffey DOB: 03/10/1956 MRN: [id number]   Date of Service  04-12-2021  HPI/Events of Note  Episode of Hypotension - BP now = 109/71 by A-line.  eICU Interventions  Plan: 1. Bolus with 0.9 NaCl 500 mL IV over 30 minutes now. 2. ABG STAT. 3. Add Vasopressin IV infusion at shock dose. 4. Increase ceiling on Norepinephrine IV infusion to 60 mcg/min.     Intervention Category Major Interventions: Hypotension - evaluation and management  Lysle Dingwall 04/12/2021, 12:47 AM

## 2021-04-02 NOTE — Progress Notes (Signed)
Patient expired at 1644, pronounced by two RN's. Family at bedside. Physician and e-link notified.

## 2021-04-02 NOTE — Progress Notes (Signed)
Pharmacy Antibiotic Note  Victoria Coffey is a 65 y.o. female admitted on 03/05/2021 s/p cardiac arrest now with concern for intra-abdominal infection.  Pharmacy has been consulted for Zosyn dosing. WBC elevated 18.2, LA >11, Scr 1.6, CrCl 28 ml/min, afebrile, remains on pressors, cultures pending.  Plan: Zosyn 3.375 gm IV q8 hrs (4-hr infusion) Monitor renal function, cultures/sensitivities, clinical progression   Height: 5\' 2"  (157.5 cm) Weight: 56.4 kg (124 lb 5.4 oz) IBW/kg (Calculated) : 50.1  Temp (24hrs), Avg:93.4 F (34.1 C), Min:92.66 F (33.7 C), Max:95.8 F (35.4 C)  Recent Labs  Lab 03/12/2021 1824 03/11/2021 1828 03/09/2021 2120 04-06-21 0530  WBC 12.1*  --   --  18.2*  CREATININE 1.72* 1.60*  --  1.60*  LATICACIDVEN  --   --  10.8* >11.0*    Estimated Creatinine Clearance: 28.1 mL/min (A) (by C-G formula based on SCr of 1.6 mg/dL (H)).    Allergies  Allergen Reactions  . Grapefruit Flavor [Flavoring Agent] Hives  . Pomegranate (Punica Granatum) Hives    Antimicrobials this admission: Zosyn 5/14 >>  Flagyl 5/13 >> 5/14  Dose adjustments this admission: None  Microbiology results: 5/14 BCx: pend 5/13 MRSA PCR: neg  Richardine Service, PharmD, BCPS PGY2 Cardiology Pharmacy Resident Phone: [phone] 04-06-2021  9:48 AM  Please check [email or web address] for unit-specific pharmacy phone numbers.

## 2021-04-02 NOTE — Consult Note (Addendum)
Palliative Medicine Inpatient Consult Note  Reason for consult:  Goals of Care "metastatic breast cancer to liver, cardiac arrest, oncology recommended palliative care"  HPI:  Per intake H&P --> 65 year old lady, past medical history of hypertension, recurrent breast cancer with metastasis to the liver.  First diagnosed in 1993.  Initially treated with lumpectomy, chemotherapy and radiation.  Patient found to have recurrence of disease in April 2021 again treated with chemotherapy and XRT.  Last treatment was 4 weeks ago.  Patient found down in the bathroom.  Syncopal event was unresponsive.  Daughter started CPR.  At 8 minutes of chest compressions while EMS arrived.  Additional 20 minutes of chest compressions.  CT scan of the chest consistent with aspiration on arrival patient was intubated placed on life support vasopressors CT imaging confirmed liver metastasis fluid within the gallbladder and diffuse colitis.  Palliative care was asked to get involved in the setting of incurable breast cancer with metastasis to the liver.  Patient suffered a cardiac arrest requiring CPR and is presently in multisystem organ failure.  Clinical Assessment/Goals of Care:  *Please note that this is a verbal dictation therefore any spelling or grammatical errors are due to the "[facility]" system interpretation.  I have reviewed medical records including EPIC notes, labs and imaging, received report from bedside RN, assessed the patient who is intubated and on multiple vasopressors   I met with Laverna Peace (husband) and Aasiya Creasey (son)  to further discuss diagnosis prognosis, GOC, EOL wishes, disposition and options.   I introduced Palliative Medicine as specialized medical care for people living with serious illness. It focuses on providing relief from the symptoms and stress of a serious illness. The goal is to improve quality of life for both the patient and the family.  Me is from [place]  originally.  She has been married to her husband for over 24 years in July.  She has both a son and a daughter as well as a grandson.  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  She is a very well educated well spoken woman.  She loves her family tremendously.  She is a woman of [employer].  Prior to hospitalization patient was fully mobile and able to complete all basic activities of daily living independently.  Laverna Peace discusses that she was walking and going to the gym regularly.  I asked for me to give me an impression of what he understands about Devery's past medical history.  He reviews with me that Jacqualin has been battling breast cancer for the past almost 30 years.  Laverna Peace shares that he has seen her through the ups and downs of her health deficits and Merilyn has remained optimistic throughout all of them.  Laverna Peace discusses that Brittania's cancer came back in her liver and as of recently they were told that her cancer is not treatable and is at the "end stages".  Clide Dales the topic of CODE STATUS and Laverna Peace shares with me that he had a very poor encounter with the patient's oncologist and "will not discuss DNR status with me".  Laverna Peace goes on to share that he had been ready to proceed with DNR and now has decided to pursue some homeopathic options.  I shared with Laverna Peace my goal is not to force he or his family to pursue any path but we should discuss Jaselyn's present health state to make further decisions regarding her care.  I asked Laverna Peace to  provide me with information about family's present health state.  He expresses that he believes she went into cardiac arrest in the setting of having a colon massage and a large bowel movement.  He shares with me that Alejandro has been seeing a homeopathic doctor who has been dictating care Rickey Primus [phone].  Asked what her impression of the patient's present situation is and Laverna Peace said he did not know.  I shared with  Laverna Peace and Roxy Manns that it might be of utility to have Dr. Valeta Harms involved in this conversation to provide a more comprehensive medical update enabling them to make informed decisions.  Dr. Valeta Harms joined the number conversation.  He was able to share with Laverna Peace that Jourdan had "died" prior to resuscitation efforts.  Reviewed that medicine had brought her back though she has endured tremendous trauma to her liver, heart, kidneys, lungs in the setting of this resuscitation event and her underlying incurable metastatic disease.  Reviewed that no matter what we do we are unable to cure multisystem organ failure and that is only a matter time before she suffers another cardiac arrest.  Jimmy processed this for quite some time.  During this time his son, Roxy Manns came back and Dr. Valeta Harms repeated the same information.  Dr. Valeta Harms went on to share that in his opinion it would be appropriate to enable all family and friends to visit with Delcie Roch and then to make her comfortable and allow her to pass away with dignity and peace.  Inpatient comfort care was described. Patient will now be DNAR code status.   Visitation policy was described.  Dr. Valeta Harms completed comfort care orders for when the family determines they are ready to proceed with compassionate extubation.  Decision Maker: Raya Mckinstry (spouse) [phone]  SUMMARY OF RECOMMENDATIONS   DNAR  Plan for compassionate extubation once family visits  Comfort care orders placed by CCM  Spiritual support will be provided by patient's own congregation  Ongoing supportive care  Code Status/Advance Care Planning: DNAR   Palliative Prophylaxis:   Oral care, turn every 2 hours,  Additional Recommendations (Limitations, Scope, Preferences):  Plan to transition to comfort care once family has visited   Psycho-social/Spiritual:   Desire for further Chaplaincy support:  No  Additional Recommendations:  Education on postcardiac arrest   Prognosis:   Prognosis post extubation is limited to hours  Discharge Planning:  Discharge will likely be celestial   Vitals:   2021-03-30 1208 2021-03-30 1209  BP: (!) 137/95 (!) 137/95  Pulse: 95 96  Resp: (!) 27 (!) 35  Temp: (!) 95.18 F (35.1 C) (!) 95.36 F (35.2 C)  SpO2: 99% 98%    Intake/Output Summary (Last 24 hours) at 03/30/21 1329 Last data filed at Mar 30, 2021 1200 Gross per 24 hour  Intake 4719.58 ml  Output 540 ml  Net 4179.58 ml   Last Weight  Most recent update: 03-30-2021  6:10 AM   Weight  56.4 kg (124 lb 5.4 oz)           Gen: African-American female intubated HEENT: ETT in place CV: irregular rate and rhythm PULM: Ventilator ABD: soft/nontender  EXT: No edema  Neuro: Somnolent  PPS: 10%   This conversation/these recommendations were discussed with patient primary care team, Dr. Valeta Harms  Time In: 1210 Time Out: 1330 Total Time: 80 Greater than 50%  of this time was spent counseling and coordinating care related to the above assessment and plan.  [facility] Team  Team Cell Phone: [phone] Please utilize secure chat with additional questions, if there is no response within 30 minutes please call the above phone number  Palliative Medicine Team providers are available by phone from 7am to 7pm daily and can be reached through the team cell phone.  Should this patient require assistance outside of these hours, please call the patient's attending physician.

## 2021-04-02 NOTE — Progress Notes (Signed)
ABG collected and results called in to [facility].  

## 2021-04-02 NOTE — Progress Notes (Signed)
PCCM:  PCCM Goals of Care Discussion and Advanced Care Planning:   Date: 02-Apr-2021   Present Parties: Son, husband, Tacey Ruiz, NP (Palliative Care Team)   What was discussed:   I met with family in the conference room. We discussed her multiple medical co-morbidities, metastatic breast cancer, mets to liver, renal failure and persistent shock   I explained that she died at home, was coded, with ROSC, now in multi-organ failure and incurable metastatic breast cancer.   Outcome:   DNR Comfort care measure orders placed   22 mins of time was spent discussing the goals of care, advanced care planning options such as code status as well as do not resuscitate forms. [phone])

## 2021-04-02 NOTE — Progress Notes (Signed)
RT turned vent off after the patient expired. TOD called at 16:44.  ETT removed per family request.

## 2021-04-02 NOTE — Death Summary Note (Signed)
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  First diagnosed in 02-20-92.  Initially treated with lumpectomy, chemotherapy and radiation.  Patient found to have recurrence of disease in Feb 20, 2020 again treated with chemotherapy and XRT.  Last treatment was 4 weeks ago.  Patient found down in the bathroom.  Syncopal event was unresponsive.  Daughter started CPR.  At 8 minutes of chest compressions while EMS arrived.  Additional 20 minutes of chest compressions.  CT scan of the chest consistent with aspiration on arrival patient was intubated placed on life support vasopressors CT imaging confirmed liver metastasis fluid within the gallbladder and diffuse colitis.  Diagnoses  Preliminary cause of death: Lactic acidosis Secondary Diagnoses (including complications and co-morbidities):  Active Problems:   Malignant neoplasm of upper-outer quadrant of left breast in female, estrogen receptor negative ([place])   Cardiac arrest ([place])   Breast cancer, stage 4 ([facility])   Acute liver failure   Acute renal failure (ARF) ([place])   Acute hypoxemic respiratory failure [facility])   Brief Hospital Course (including significant findings, care, treatment, and services provided and events leading to death)  Victoria Coffey is a 65 y.o. 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  First diagnosed in 1992-02-20.  Initially treated with lumpectomy, chemotherapy and radiation.  Patient found to have recurrence of disease in 02/20/2020 again treated with chemotherapy and XRT.  Last treatment  was 4 weeks ago.  Patient found down in the bathroom.  Syncopal event was unresponsive.  Daughter started CPR.  At 8 minutes of chest compressions while EMS arrived.  Additional 20 minutes of chest compressions.  CT scan of the chest consistent with aspiration on arrival patient was intubated placed on life support vasopressors CT imaging confirmed liver metastasis fluid within the gallbladder and diffuse colitis.  Cardiac arrest possibly due to hyperkalemia Metastatic breast cancer, end-stage, oncology recommended outpatient palliative care and DNR status Acute liver injury, jaundice likely from metastatic breast, liver mets Septic shock, diffuse colitis, aspiration pneumonia postarrest Acute kidney injury, acute renal failure secondary to above Do not think she is had a acute coronary syndrome Acute hypoxemic respiratory failure requiring intubation mechanical ventilation secondary to above HIV infection  Patient has multiple medical problems prior to admission.  End-stage metastatic breast cancer with multiple metastasis to the liver.  After long discussion with patient's family after suffering cardiac arrest we made the decision for comfort care withdrawal.  Patient was placed on comfort care measures and ultimately passed on mechanical support before family had made decisions for liberation.  Patient passed with family at bedside on comfort measures as a DNR  Pertinent Labs and Studies  Significant Diagnostic Studies CT Head Wo Contrast  Result Date: 03/25/2021 CLINICAL DATA:  Mental status change.  Unknown cause.  Syncope. EXAM: CT HEAD WITHOUT CONTRAST CT CERVICAL SPINE WITHOUT CONTRAST TECHNIQUE: Multidetector CT imaging of the head and cervical spine was performed following the standard protocol without intravenous contrast. Multiplanar CT image reconstructions of the cervical spine were also generated. COMPARISON:  PET CT 01/13/2021 FINDINGS: CT HEAD  FINDINGS Brain: Cerebral ventricle  sizes are concordant with the degree of cerebral volume loss. Patchy and confluent areas of decreased attenuation are noted throughout the deep and periventricular white matter of the cerebral hemispheres bilaterally, compatible with chronic microvascular ischemic disease. No evidence of large-territorial acute infarction. No parenchymal hemorrhage. No mass lesion. No extra-axial collection. No mass effect or midline shift. No hydrocephalus. Basilar cisterns are patent. Vascular: No hyperdense vessel. Skull: No acute fracture or focal lesion. Sinuses/Orbits: Paranasal sinuses and mastoid air cells are clear. The orbits are unremarkable. Other: Secretions within the naso-oropharynx. CT CERVICAL SPINE FINDINGS Alignment: Normal. Skull base and vertebrae: Densely sclerotic lesion within the right C2 articular facet and C2 vertebral body (3:29) that likely represent bone islands and are not noted to be hypermetabolic on the PET CT 123456. No acute fracture. No aggressive appearing focal osseous lesion or focal pathologic process. Soft tissues and spinal canal: No prevertebral fluid or swelling. No visible canal hematoma. Upper chest: Unremarkable. Other: Partially visualized endotracheal tube within the trachea and enteric tube within the esophagus. Bilateral, left greater than right, mandibular lytic and blastic lesions (6:5, 9). IMPRESSION: 1. No acute intracranial abnormality. 2. No acute displaced fracture or traumatic listhesis of the cervical spine. 3. Indeterminate bilateral, left greater than right, mandibular lytic and blastic lesions. Recommend nonemergent CT max face for further evaluation. Electronically Signed   By: Iven Finn M.D.   On: 03/23/2021 20:36   CT Cervical Spine Wo Contrast  Result Date: 03/19/2021 CLINICAL DATA:  Mental status change.  Unknown cause.  Syncope. EXAM: CT HEAD WITHOUT CONTRAST CT CERVICAL SPINE WITHOUT CONTRAST TECHNIQUE: Multidetector CT imaging of the head and  cervical spine was performed following the standard protocol without intravenous contrast. Multiplanar CT image reconstructions of the cervical spine were also generated. COMPARISON:  PET CT 01/13/2021 FINDINGS: CT HEAD FINDINGS Brain: Cerebral ventricle sizes are concordant with the degree of cerebral volume loss. Patchy and confluent areas of decreased attenuation are noted throughout the deep and periventricular white matter of the cerebral hemispheres bilaterally, compatible with chronic microvascular ischemic disease. No evidence of large-territorial acute infarction. No parenchymal hemorrhage. No mass lesion. No extra-axial collection. No mass effect or midline shift. No hydrocephalus. Basilar cisterns are patent. Vascular: No hyperdense vessel. Skull: No acute fracture or focal lesion. Sinuses/Orbits: Paranasal sinuses and mastoid air cells are clear. The orbits are unremarkable. Other: Secretions within the naso-oropharynx. CT CERVICAL SPINE FINDINGS Alignment: Normal. Skull base and vertebrae: Densely sclerotic lesion within the right C2 articular facet and C2 vertebral body (3:29) that likely represent bone islands and are not noted to be hypermetabolic on the PET CT 123456. No acute fracture. No aggressive appearing focal osseous lesion or focal pathologic process. Soft tissues and spinal canal: No prevertebral fluid or swelling. No visible canal hematoma. Upper chest: Unremarkable. Other: Partially visualized endotracheal tube within the trachea and enteric tube within the esophagus. Bilateral, left greater than right, mandibular lytic and blastic lesions (6:5, 9). IMPRESSION: 1. No acute intracranial abnormality. 2. No acute displaced fracture or traumatic listhesis of the cervical spine. 3. Indeterminate bilateral, left greater than right, mandibular lytic and blastic lesions. Recommend nonemergent CT max face for further evaluation. Electronically Signed   By: Iven Finn M.D.   On: 03/13/2021  20:36   US Abdomen Complete  Result Date: 02/26/2021 CLINICAL DATA:  Jaundice. EXAM: ABDOMEN ULTRASOUND COMPLETE COMPARISON:  None. FINDINGS: Gallbladder: Partially collapsed gallbladder noted. There is mild wall thickening measuring 3 mm. No  gallstones, pericholecystic fluid or sonographic Murphy's sign. Common bile duct: Diameter: 3.9 mm Liver: The liver appears diffusely heterogeneous. Again seen are numerous liver lesions are identified as demonstrated on recent PET-CT compatible with multiple liver metastases. Portal vein is patent on color Doppler imaging with normal direction of blood flow towards the liver. IVC: No abnormality visualized. Pancreas: Visualized portion unremarkable. Spleen: Size and appearance within normal limits. Right Kidney: Length: 8.7 cm. Echogenicity within normal limits. No mass or hydronephrosis visualized. Left Kidney: Length: 8.9 cm. Echogenicity within normal limits. No mass or hydronephrosis visualized. Abdominal aorta: No aneurysm visualized. Other findings: None. IMPRESSION: 1. Numerous liver lesions are identified compatible with diffuse liver metastases. 2. Gallbladder wall thickness is upper limits of normal at 3 mm. This is nonspecific and may reflect incomplete distention. Electronically Signed   By: Kerby Moors M.D.   On: 02/26/2021 12:06   DG Chest Port 1 View  Result Date: 03/19/2021 CLINICAL DATA:  Hypoxia.  History of breast carcinoma.  Recent CPR EXAM: PORTABLE CHEST 1 VIEW COMPARISON:  Chest radiograph Mar 22, 2021; PET-CT January 13, 2021 FINDINGS: Endotracheal tube tip is 3.1 cm above the carina. Nasogastric tube tip and side port in stomach. Port-A-Cath tip is at the cavoatrial junction. No pneumothorax. The lungs are clear. The heart size and pulmonary vascular normal. No appreciable adenopathy. There are surgical clips in each axillary region. No acute fracture evident. IMPRESSION: Tube and catheter positions as described. No pneumothorax. No edema or  airspace opacity. Heart size within normal limits. Electronically Signed   By: Lowella Grip III M.D.   On: 03/11/2021 19:35   CT CHEST ABDOMEN PELVIS WO CONTRAST  Result Date: 03/10/2021 CLINICAL DATA:  Syncope pulseless apnea EXAM: CT CHEST, ABDOMEN AND PELVIS WITHOUT CONTRAST TECHNIQUE: Multidetector CT imaging of the chest, abdomen and pelvis was performed following the standard protocol without IV contrast. COMPARISON:  Ultrasound 02/26/2021, PET CT 01/13/2021, CT 03/20/2020 FINDINGS: CT CHEST FINDINGS Cardiovascular: Limited evaluation without intravenous contrast. Nonaneurysmal aorta. Coronary vascular calcification. Normal cardiac size. Trace pericardial effusion. Right-sided central venous port tip at the cavoatrial region. Mediastinum/Nodes: Midline trachea. No thyroid mass. Endotracheal tube tip several cm superior to carina. No suspicious adenopathy. Esophageal tube in the esophagus. Lungs/Pleura: No pleural effusion or pneumothorax. Partial consolidations within the posterior right upper lobe and the bilateral lower lobes. Musculoskeletal: Possible nondisplaced fracture involving the upper body of the sternum. Minimal superior endplate deformity [id number]. Faint foci of sclerosis T11 and T3 without change. CT ABDOMEN PELVIS FINDINGS Hepatobiliary: Hypodense liver masses vaguely visualized corresponding to history of metastatic disease. Liver is enlarged measuring 19 cm craniocaudad. Hyperdense sludge or stones in the gallbladder. The gallbladder wall appears diffusely thickened. No biliary dilatation Pancreas: Mild peripancreatic edema.  No ductal dilatation Spleen: Normal in size without focal abnormality. Adrenals/Urinary Tract: Adrenal glands are normal. Kidneys show no hydronephrosis. Foley catheter in the bladder Stomach/Bowel: Esophageal tube tip in the body of the stomach. Decompressed stomach. No dilated small bowel. Negative appendix. Mild fluid in the colon. Possible mild wall thickening at  the hepatic flexure. Vascular/Lymphatic: Mild aortic atherosclerosis. No aneurysm. No suspicious nodes Reproductive: Right uterine fundal hyperdensity possibly due to fibroid. No adnexal mass Other: No free air. Small free fluid in the pelvis. Small perihepatic and perisplenic ascites. Musculoskeletal: No acute or suspicious osseous abnormality. IMPRESSION: 1. Partial consolidations within the posterior right upper lobe as well as the posteromedial lower lobes, suspicious for aspiration. 2. Indistinct appearance of pancreas with possible peripancreatic  edema as may be seen with pancreatitis. Correlate with appropriate enzymes. 3. Enlarged liver with multiple masses consistent with hepatic metastatic disease. Small amount of ascites within the abdomen and pelvis. 4. Sludge within the gallbladder. Gallbladder wall appears diffusely thickened which is a nonspecific finding and may be correlated with ultrasound as indicated. 5. Fluid within the colon. Possible mild wall thickening at hepatic flexure as may be seen with a colitis. Electronically Signed   By: Donavan Foil M.D.   On: 03/20/2021 20:46   US Abdomen Limited RUQ (LIVER/GB)  Result Date: 04-13-2021 CLINICAL DATA:  Septic shock EXAM: ULTRASOUND ABDOMEN LIMITED RIGHT UPPER QUADRANT COMPARISON:  Abdominal CT from yesterday FINDINGS: Gallbladder: Prominent gallbladder wall thickness in the setting of under distension. Negative for stone. Murphy sign cannot be assessed in the setting of intubation. Common bile duct: Diameter: 6 mm.  Where visualized, no filling defect. Liver: Heterogeneous liver with lobulation. The liver has a normal appearance on 04/17/2020 MRI. There is known widespread metastatic disease. Portal vein is patent on color Doppler imaging with normal direction of blood flow towards the liver. IMPRESSION: 1. Thick walled gallbladder which is attributed to under distension. Negative for gallbladder calculus. 2. Extensive hepatic metastatic  disease. Electronically Signed   By: Monte Fantasia M.D.   On: 04/13/2021 10:37    Microbiology Recent Results (from the past 240 hour(s))  Culture, blood (single)     Status: None (Preliminary result)   Collection Time: 03/17/2021  7:20 PM   Specimen: BLOOD  Result Value Ref Range Status   Specimen Description BLOOD LEFT A-LINE DRAW  Final   Special Requests   Final    BOTTLES DRAWN AEROBIC AND ANAEROBIC Blood Culture adequate volume   Culture   Final    NO GROWTH 4 DAYS Performed at [facility], [address], [place], [place] [address]    Report Status PENDING  Incomplete  SARS CORONAVIRUS 2 (TAT 6-24 HRS) Nasopharyngeal Nasopharyngeal Swab     Status: None   Collection Time: 03/06/2021  9:25 PM   Specimen: Nasopharyngeal Swab  Result Value Ref Range Status   SARS Coronavirus 2 NEGATIVE NEGATIVE Final    Comment: (NOTE) SARS-CoV-2 target nucleic acids are NOT DETECTED.  The SARS-CoV-2 RNA is generally detectable in upper and lower respiratory specimens during the acute phase of infection. Negative results do not preclude SARS-CoV-2 infection, do not rule out co-infections with other pathogens, and should not be used as the sole basis for treatment or other patient management decisions. Negative results must be combined with clinical observations, patient history, and epidemiological information. The expected result is Negative.  Fact Sheet for Patients: SugarRoll.be  Fact Sheet for Healthcare Providers: https://www.woods-mathews.com/  This test is not yet approved or cleared by the [place] FDA and  has been authorized for detection and/or diagnosis of SARS-CoV-2 by FDA under an Emergency Use Authorization (EUA). This EUA will remain  in effect (meaning this test can be used) for the duration of the COVID-19 declaration under Se ction 564(b)(1) of the Act, 21 U.S.C. section 360bbb-3(b)(1), unless the authorization is  terminated or revoked sooner.  Performed at [facility], [place] [address] [place], [place] [address]   MRSA PCR Screening     Status: None   Collection Time: 03/06/2021 10:47 PM   Specimen: Nasopharyngeal  Result Value Ref Range Status   MRSA by PCR NEGATIVE NEGATIVE Final    Comment:        The  GeneXpert MRSA Assay (FDA approved for NASAL specimens only), is one component of a comprehensive MRSA colonization surveillance program. It is not intended to diagnose MRSA infection nor to guide or monitor treatment for MRSA infections. Performed at [facility], [place] [address], [place], [place] [address]   Culture, blood (routine x 2)     Status: None (Preliminary result)   Collection Time: 03/11/2021 12:46 AM   Specimen: BLOOD LEFT HAND  Result Value Ref Range Status   Specimen Description BLOOD LEFT HAND  Final   Special Requests   Final    BOTTLES DRAWN AEROBIC ONLY Blood Culture results may not be optimal due to an inadequate volume of blood received in culture bottles   Culture   Final    NO GROWTH 3 DAYS Performed at [facility], [place] [address], [place], [place] [address]    Report Status PENDING  Incomplete  Culture, blood (routine x 2)     Status: None (Preliminary result)   Collection Time: 03/11/2021  1:09 AM   Specimen: BLOOD RIGHT HAND  Result Value Ref Range Status   Specimen Description BLOOD RIGHT HAND  Final   Special Requests   Final    BOTTLES DRAWN AEROBIC ONLY Blood Culture results may not be optimal due to an inadequate volume of blood received in culture bottles   Culture   Final    NO GROWTH 3 DAYS Performed at [facility], [place] [address], [place], [place] [address]    Report Status PENDING  Incomplete    Lab Basic Metabolic Panel: Recent Labs  Lab 03-25-2021 1824 Mar 25, 2021 1828 03/25/2021 1938 03/11/2021 0123 03/18/2021 0406 03/13/2021 0530 03/02/2021 0946  NA 118* 117* 124* 117* 123* 124* 123*  K 6.4* 6.3* 4.8 8.2* 5.2* 4.4 5.1   CL 88* 94*  --   --   --  89*  --   CO2 9*  --   --   --   --  11*  --   GLUCOSE 156* 143*  --   --   --  77  --   BUN 57* 65*  --   --   --  52*  --   CREATININE 1.72* 1.60*  --   --   --  1.60*  --   CALCIUM 7.7*  --   --   --   --  7.1*  --   MG  --   --   --   --   --  2.8*  --   PHOS  --   --   --   --   --  9.8*  --    Liver Function Tests: Recent Labs  Lab 03-25-21 1824 03/28/2021 0530  AST 1,439* 3,656*  ALT 318* 584*  ALKPHOS 2,160* 2,042*  BILITOT 12.0* 12.9*  PROT 4.7* 4.3*  ALBUMIN 1.3* 1.3*   No results for input(s): LIPASE, AMYLASE in the last 168 hours. Recent Labs  Lab 2021-03-25 2118  AMMONIA 106*   CBC: Recent Labs  Lab 03-25-2021 1824 Mar 25, 2021 1828 03/25/2021 0123 03/23/2021 0406 03/12/2021 0530 03/22/2021 0946 03/13/2021 1139  WBC 12.1*  --   --   --  18.2*  --  18.0*  NEUTROABS 10.5*  --   --   --   --   --   --   HGB 8.3*   < > 9.9* 8.8* 8.8* 9.9* 8.3*  HCT 25.5*   < > 29.0* 26.0* 26.1* 29.0*  24.0*  MCV 109.4*  --   --   --  104.8*  --  103.0*  PLT PLATELET CLUMPS NOTED ON SMEAR, UNABLE TO ESTIMATE  --   --   --  21*  --  18*   < > = values in this interval not displayed.   Cardiac Enzymes: No results for input(s): CKTOTAL, CKMB, CKMBINDEX, TROPONINI in the last 168 hours. Sepsis Labs: Recent Labs  Lab 03/20/2021 1824 03/03/2021 2120 Mar 28, 2021 0530 03/28/2021 1139  WBC 12.1*  --  18.2* 18.0*  LATICACIDVEN  --  10.8* >11.0*  --     Procedures/Operations   ETT   Sanam Marmo L Quindell Shere 03/18/2021, 10:49 AM

## 2021-04-02 DEATH — deceased

## 2021-06-16 ENCOUNTER — Encounter: Payer: Self-pay | Admitting: Hematology

## 2021-08-16 IMAGING — US US BIOPSY CORE LIVER
1 series · 13 of 15 positions shown · non-contrast
Comparison: none

INDICATION: 64-year-old with history of breast cancer and new liver lesions.
Tissue diagnosis is needed.

[Series 1: us core biopsy (liver) · 13 of 15 slices shown]
[im 1/15]
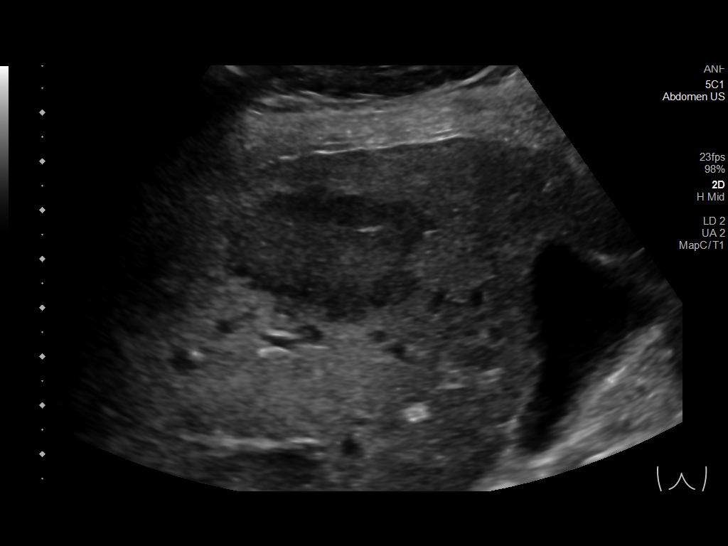
[im 2/15]
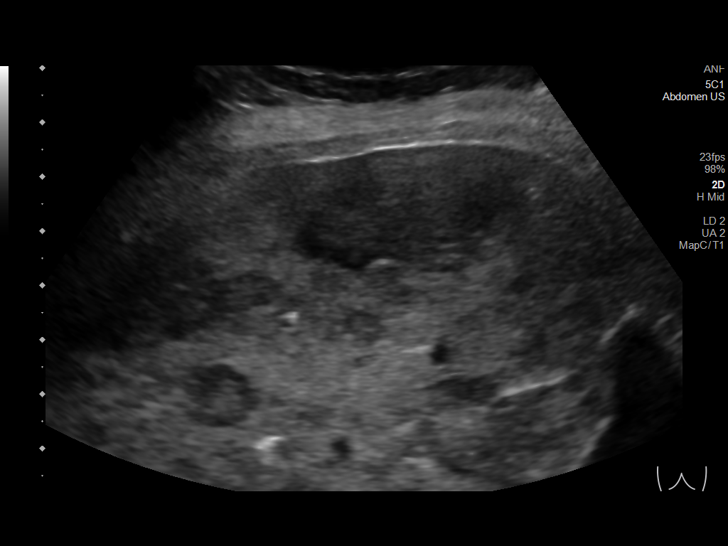
[im 3/15]
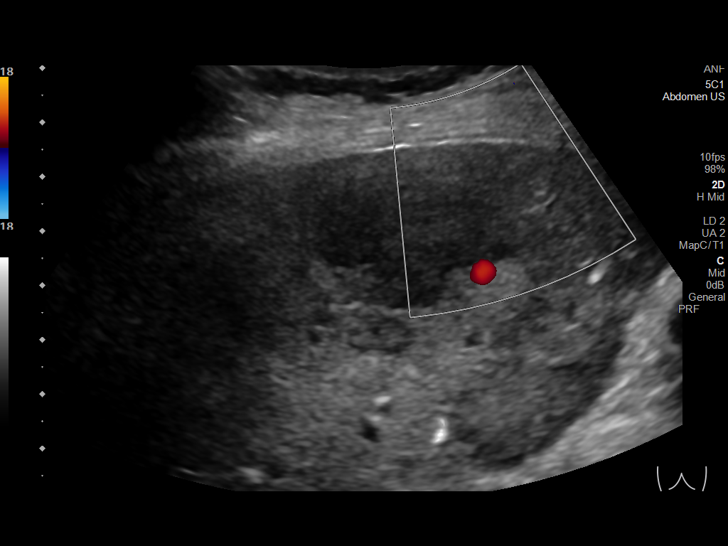
[im 5/15]
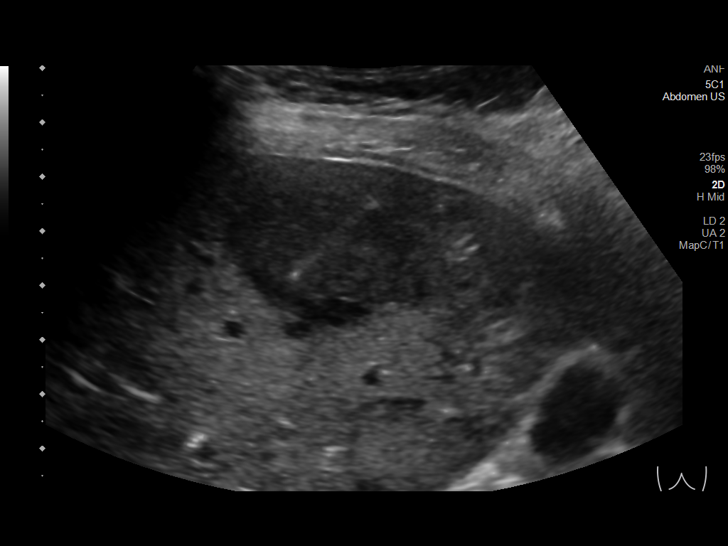
[im 6/15]
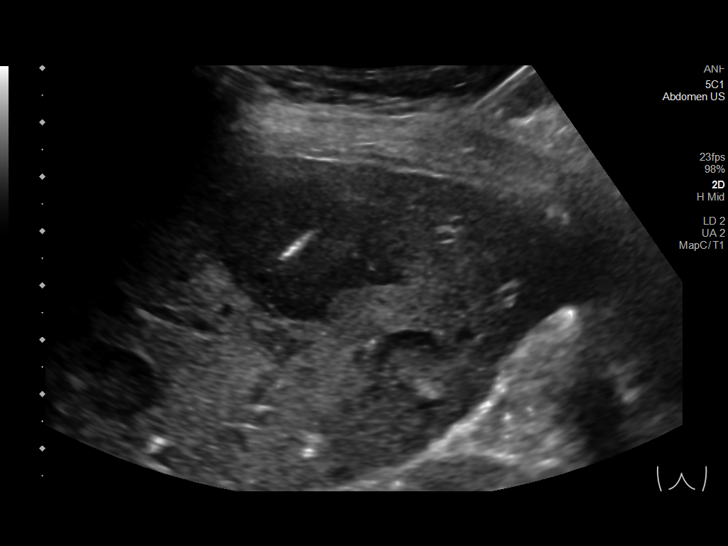
[im 7/15]
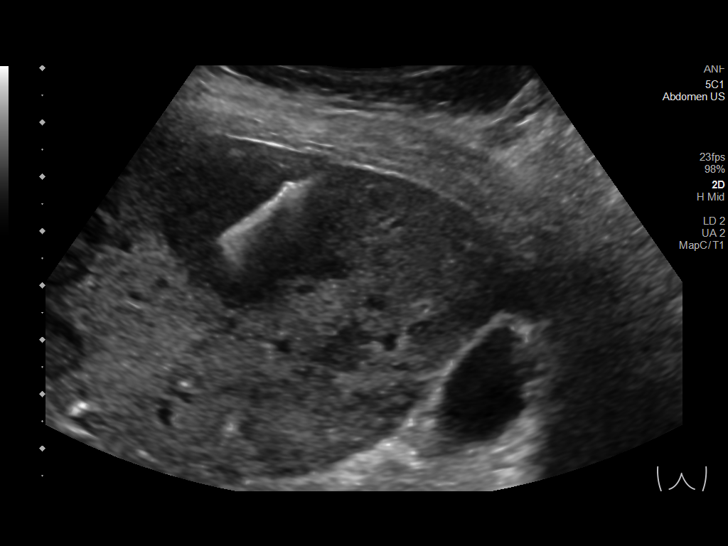
[im 8/15]
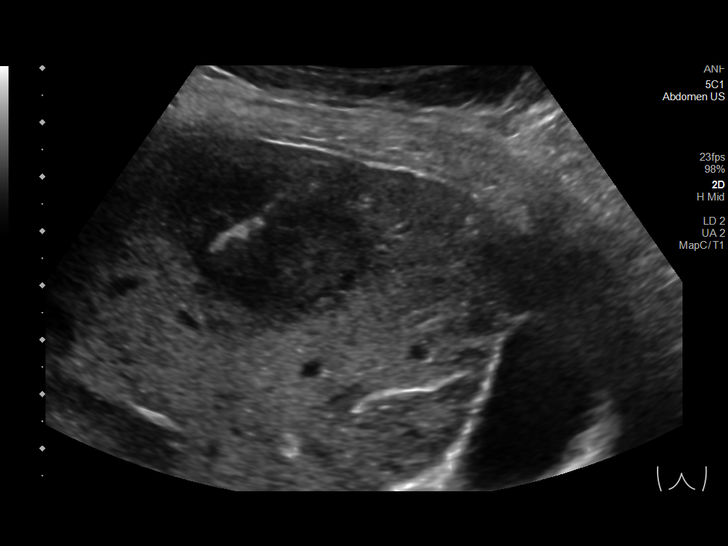
[im 9/15]
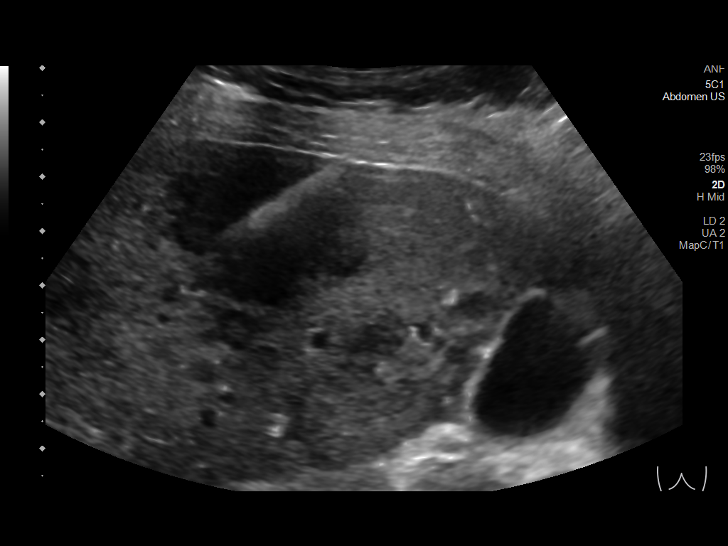
[im 10/15]
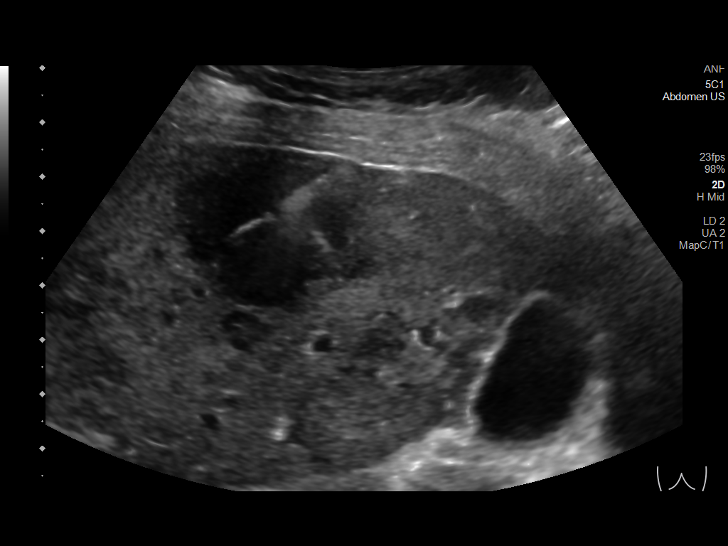
[im 11/15]
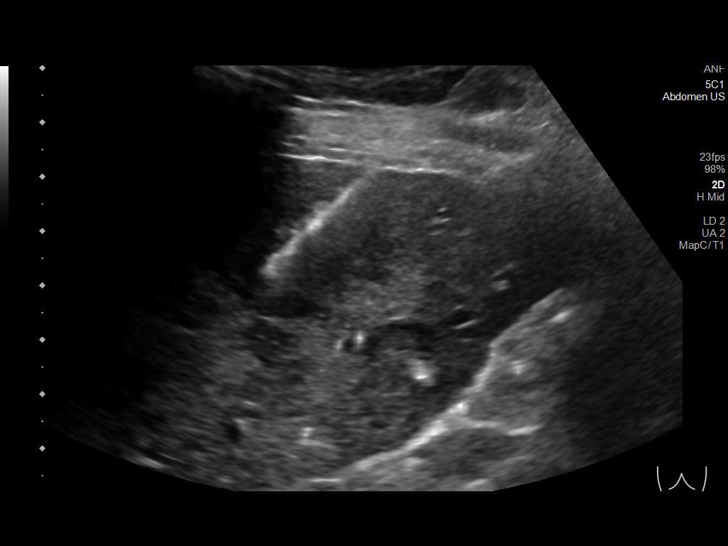
[im 13/15]
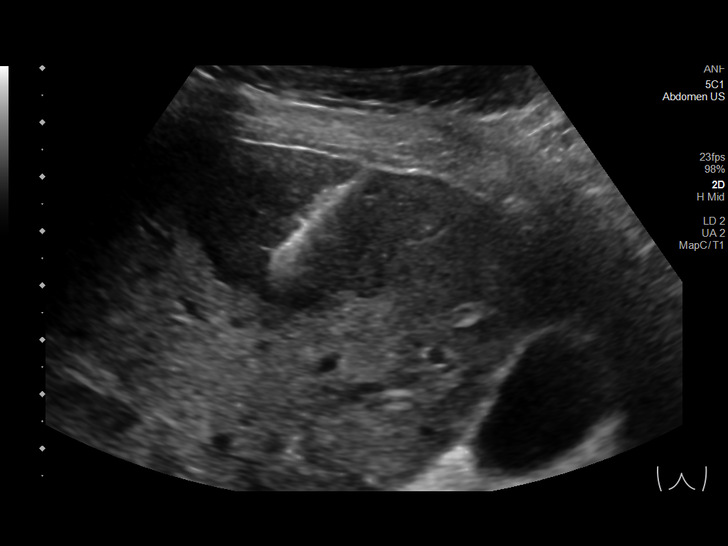
[im 14/15]
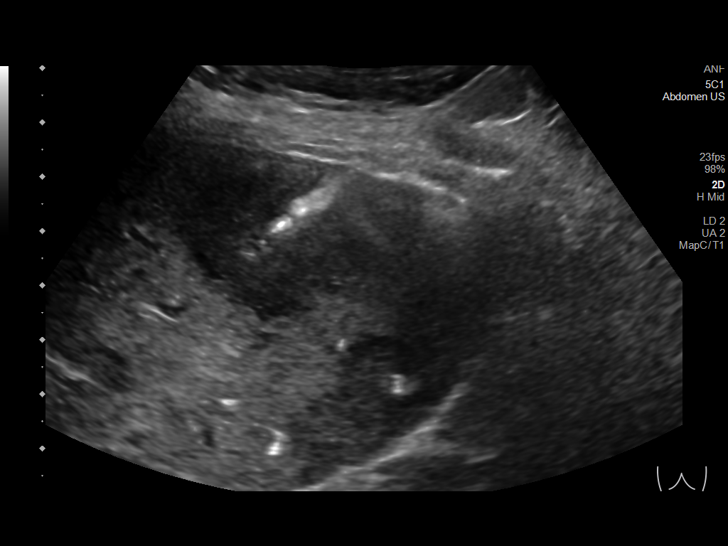
[im 15/15]
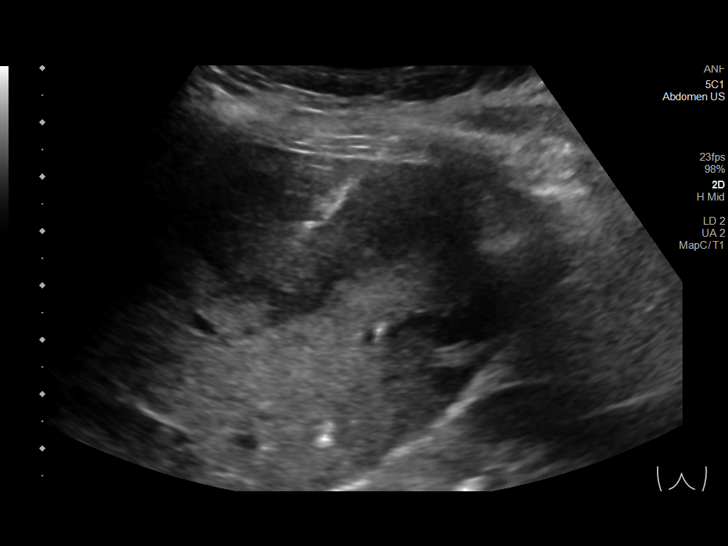

[13 of 15 positions shown; findings below may reference images not displayed]

EXAM:
ULTRASOUND-GUIDED CORE BIOPSY OF LIVER LESION

MEDICATIONS:
Moderate sedation

ANESTHESIA/SEDATION:
Moderate (conscious) sedation was employed during this procedure. A
total of Versed 1.0 mg and Fentanyl 50 mcg was administered
intravenously.

Moderate Sedation Time: 21 minutes. The patient's level of
consciousness and vital signs were monitored continuously by
radiology nursing throughout the procedure under my direct
supervision.

FLUOROSCOPY TIME:  None

COMPLICATIONS:
None immediate.

PROCEDURE:
Informed written consent was obtained from the patient after a
thorough discussion of the procedural risks, benefits and
alternatives. All questions were addressed. A timeout was performed
prior to the initiation of the procedure.

The liver was evaluated with ultrasound. Large hypoechoic lesion in
the right hepatic lobe was identified and targeted. Right side of
the abdomen was prepped with chlorhexidine and sterile field was
created. Skin was anesthetized using 1% lidocaine. A small incision
was made. Using ultrasound guidance, 17 gauge coaxial needle was
directed into the hypoechoic liver lesion. Multiple core biopsies
were obtained with an 18 gauge device. Adequate specimens were
obtained and placed in formalin. Gel-Foam slurry was injected as the
needle was removed from the liver. Bandage placed over the puncture
site.
FINDINGS: Scattered hypoechoic lesions in the right hepatic lobe. Prominent
lesion in the right hepatic lobe was targeted. Needle position was
confirmed within the lesion. No immediate bleeding or hematoma
formation following the core biopsies.
IMPRESSION: Successful ultrasound-guided core biopsy of a right hepatic lesion.

## 2021-09-27 IMAGING — US US ABDOMEN COMPLETE
1 series · 13 of 25 positions shown · non-contrast
Comparison: None.

CLINICAL DATA: Jaundice.

EXAM:
ABDOMEN ULTRASOUND COMPLETE

[Series 1: us abdomen complete · 13 of 99 slices shown]
[im 1/99]
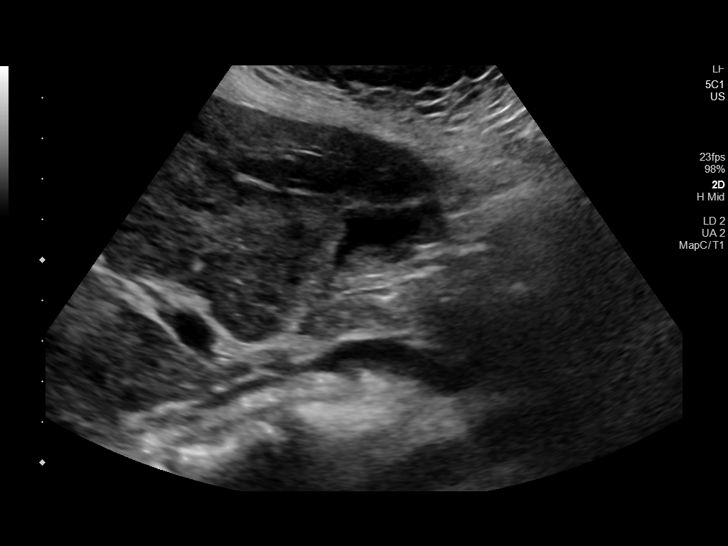
[im 9/99]
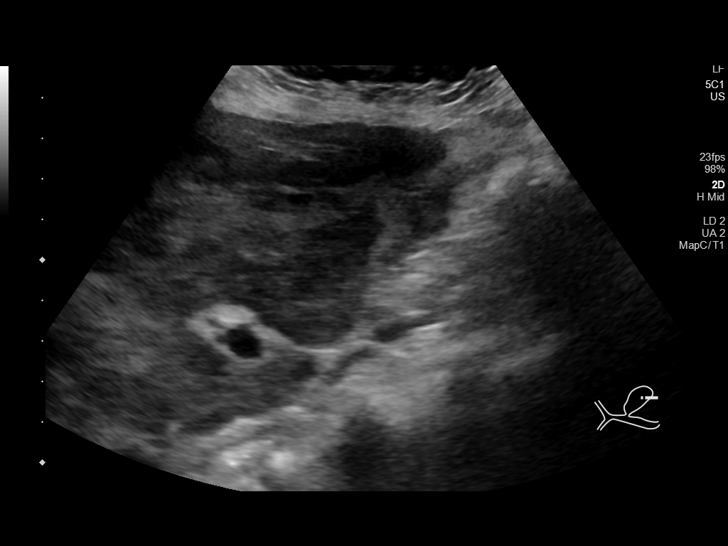
[im 17/99]
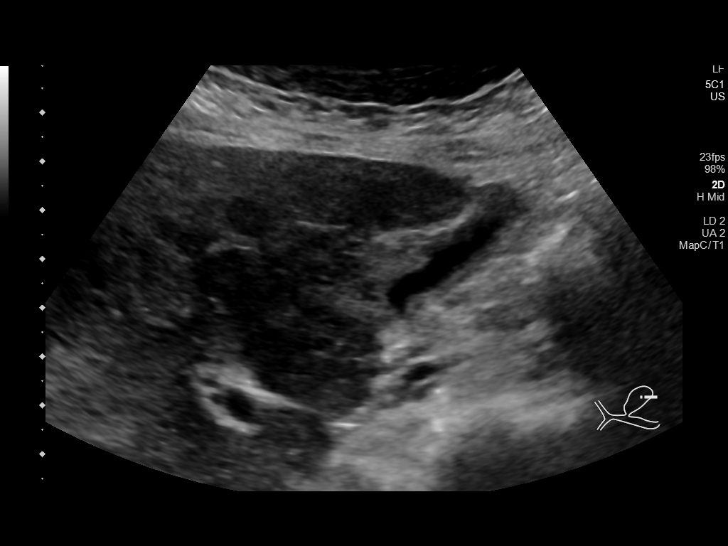
[im 25/99]
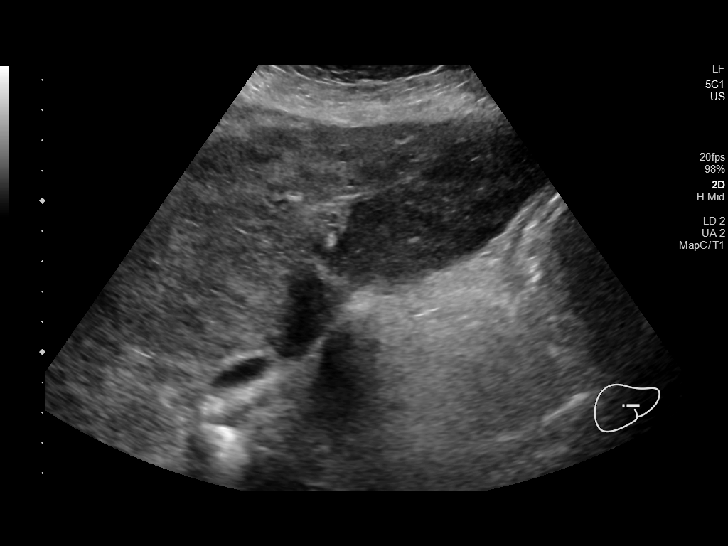
[im 33/99]
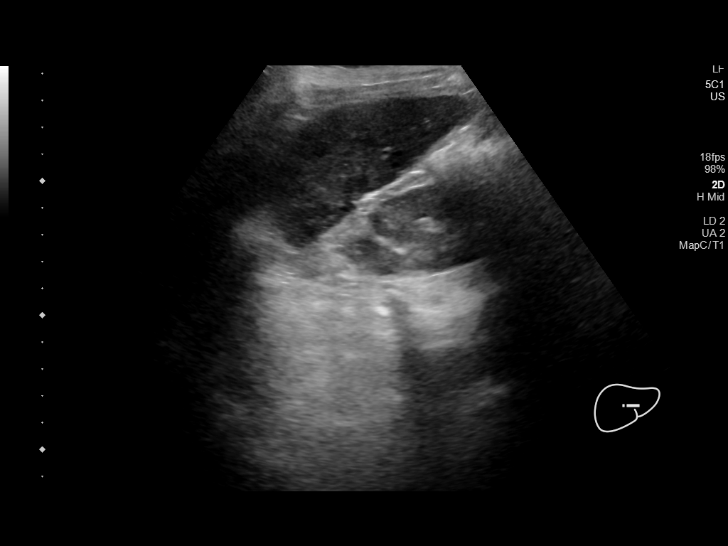
[im 41/99]
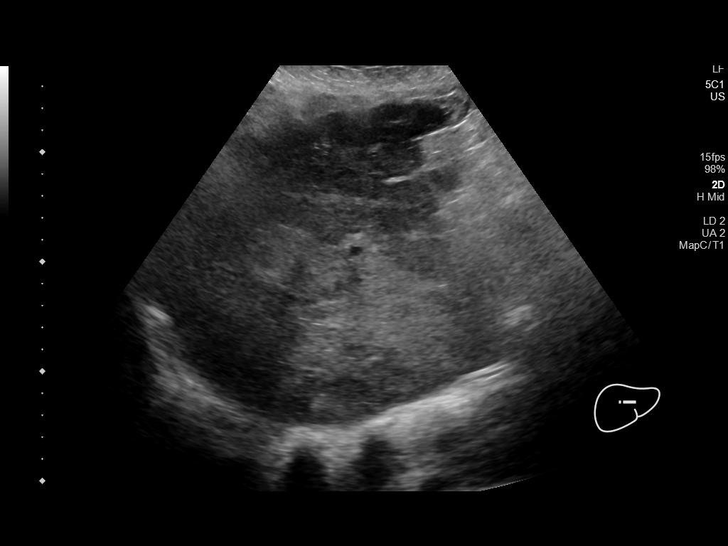
[im 50/99]
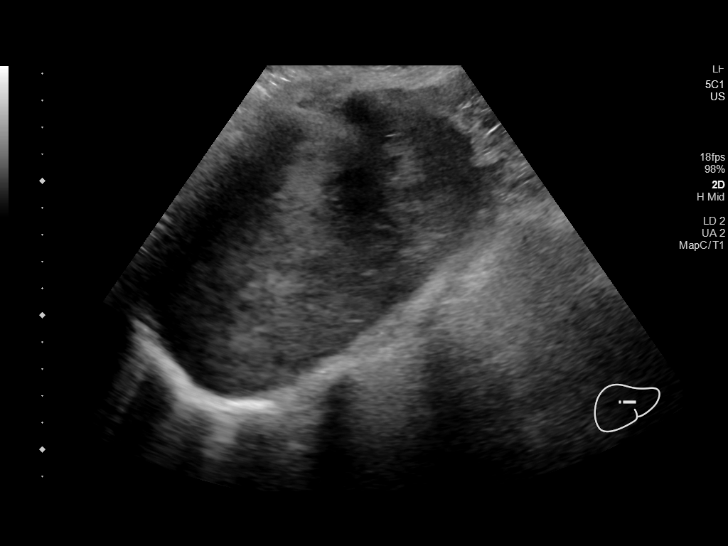
[im 58/99]
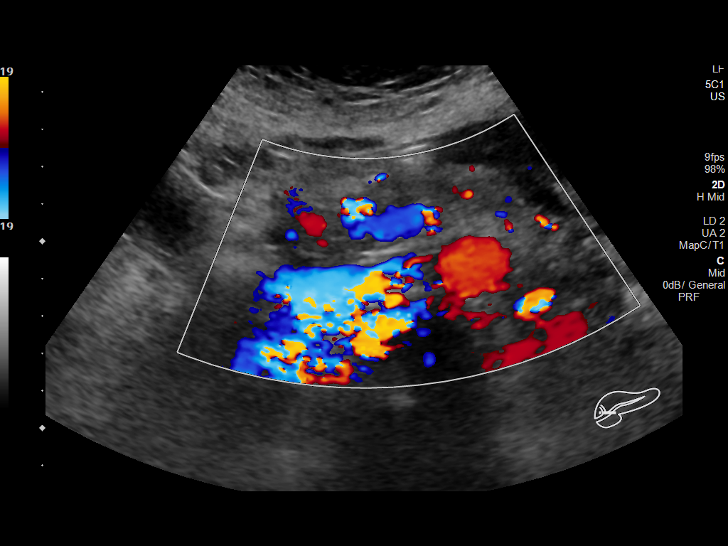
[im 66/99]
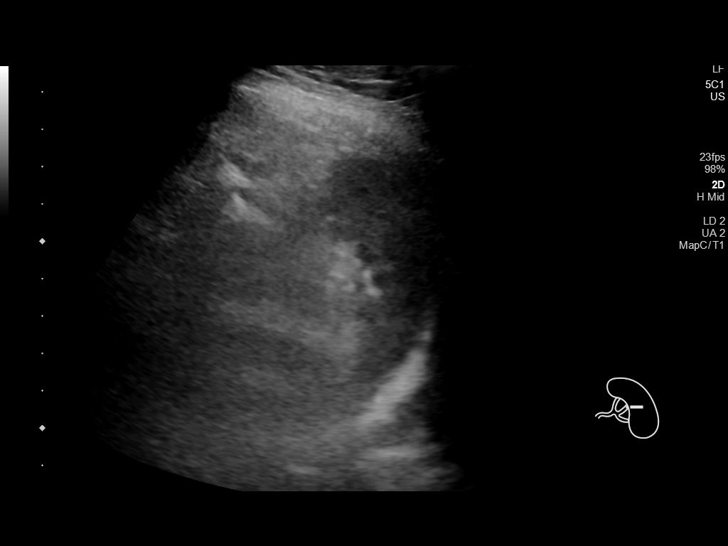
[im 74/99]
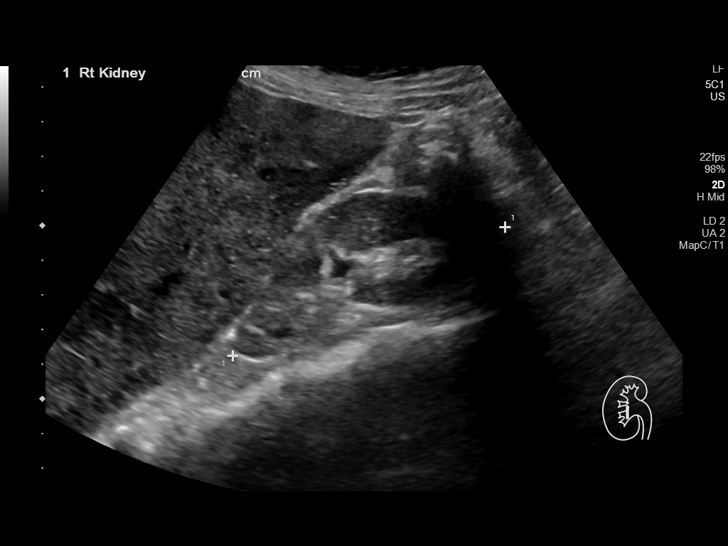
[im 82/99]
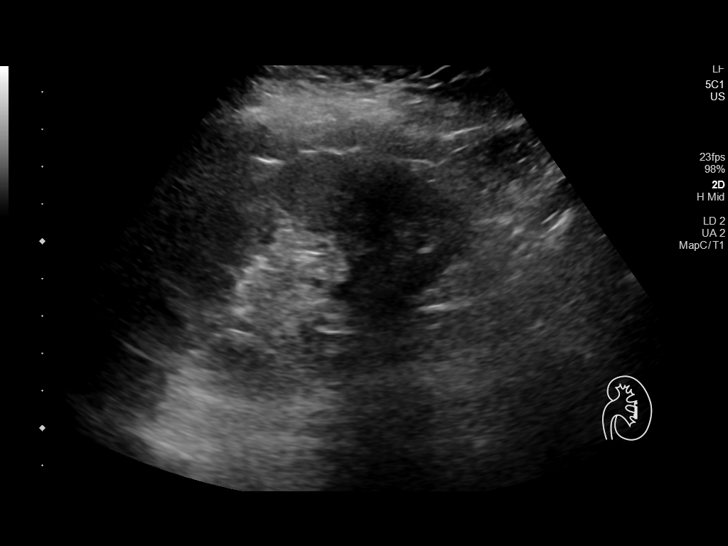
[im 90/99]
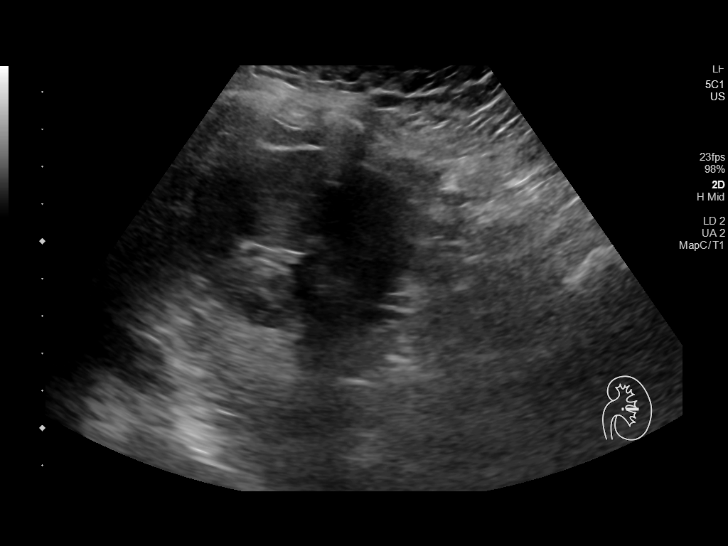
[im 99/99]
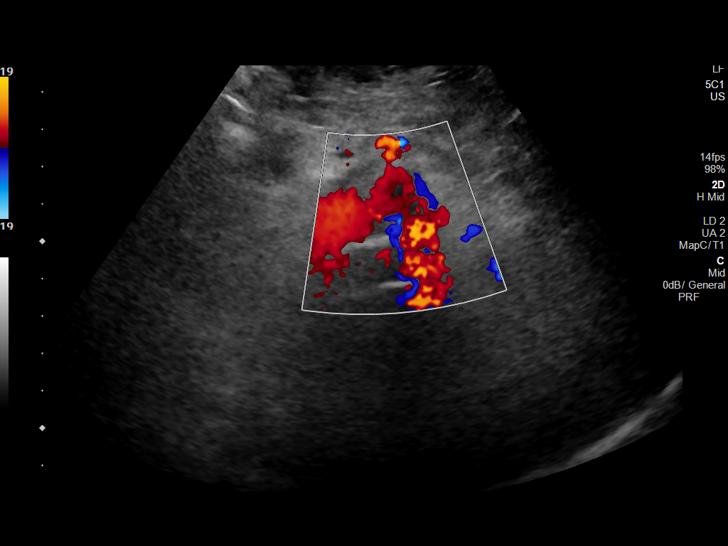

[13 of 25 positions shown; findings below may reference images not displayed]

FINDINGS: Gallbladder: Partially collapsed gallbladder noted. There is mild
wall thickening measuring 3 mm. No gallstones, pericholecystic fluid
or sonographic Murphy's sign.

Common bile duct: Diameter: 3.9 mm

Liver: The liver appears diffusely heterogeneous. Again seen are
numerous liver lesions are identified as demonstrated on recent
PET-CT compatible with multiple liver metastases. Portal vein is
patent on color Doppler imaging with normal direction of blood flow
towards the liver.

IVC: No abnormality visualized.

Pancreas: Visualized portion unremarkable.

Spleen: Size and appearance within normal limits.

Right Kidney: Length: 8.7 cm. Echogenicity within normal limits. No
mass or hydronephrosis visualized.

Left Kidney: Length: 8.9 cm. Echogenicity within normal limits. No
mass or hydronephrosis visualized.

Abdominal aorta: No aneurysm visualized.

Other findings: None.
IMPRESSION: 1. Numerous liver lesions are identified compatible with diffuse
liver metastases.
2. Gallbladder wall thickness is upper limits of normal at 3 mm.
This is nonspecific and may reflect incomplete distention.

## 2021-10-13 IMAGING — CT CT HEAD W/O CM
3 of 4 series · 14 of 47 positions shown, 16 images · non-contrast
Comparison: PET CT 01/13/2021

CLINICAL DATA: Mental status change.  Unknown cause.  Syncope.

EXAM:
CT HEAD WITHOUT CONTRAST
CT CERVICAL SPINE WITHOUT CONTRAST
TECHNIQUE: Multidetector CT imaging of the head and cervical spine was
performed following the standard protocol without intravenous
contrast. Multiplanar CT image reconstructions of the cervical spine
were also generated.

[Series 4: head 2.0 h70h · axial · 0.48mm/px · z∈[+1007,+1129]mm · 8 of 77 slices shown, 10 images]
[im 8/77  brain]
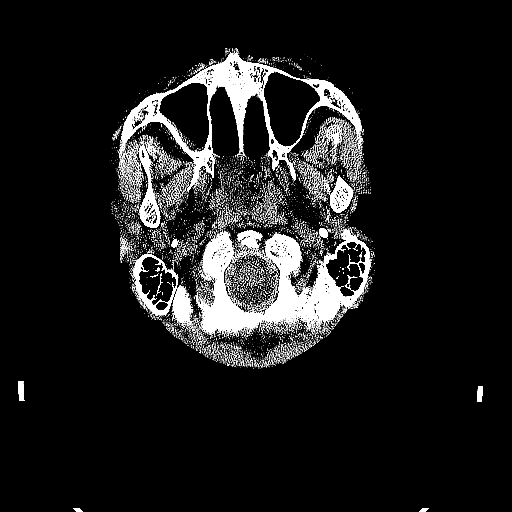
[im 8/77  bone]
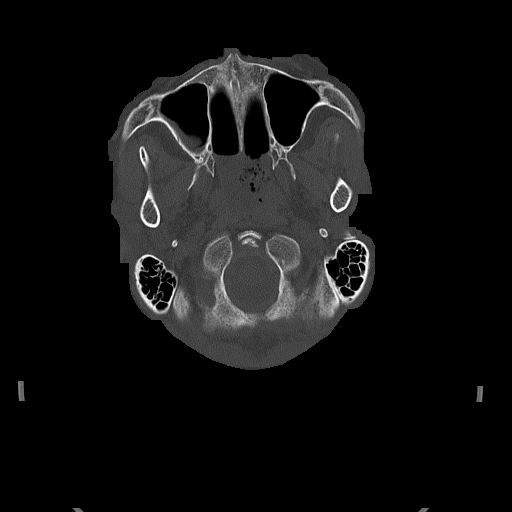
[im 16/77  brain]
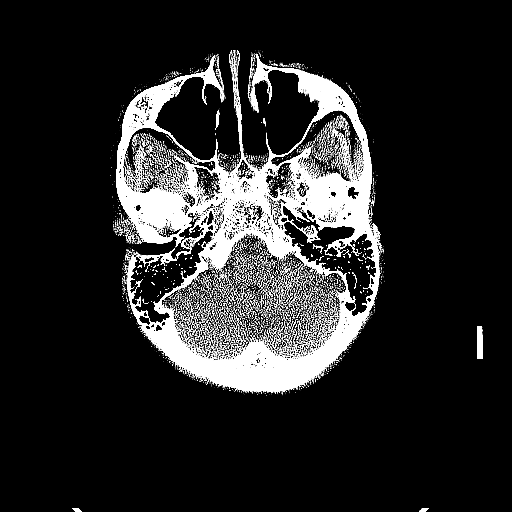
[im 23/77  brain]
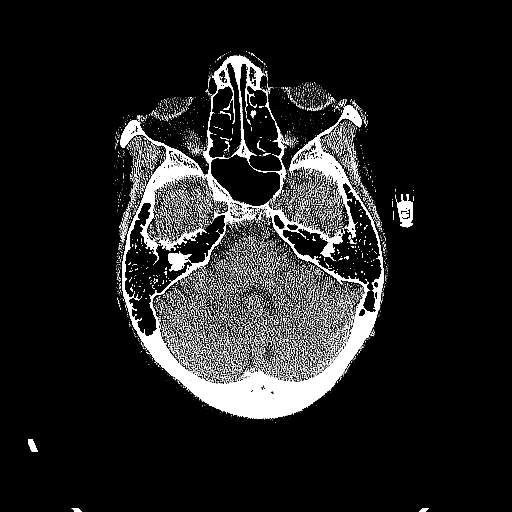
[im 35/77  brain]
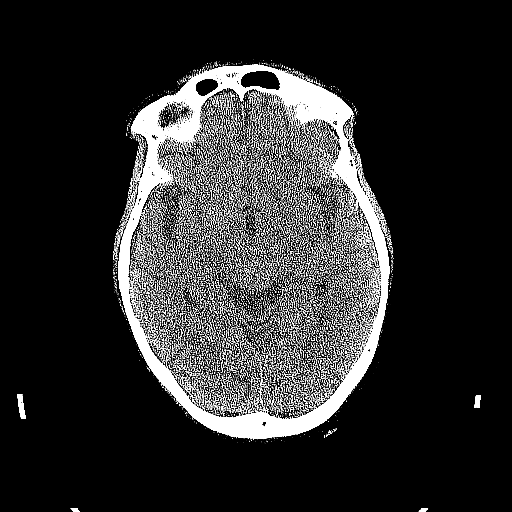
[im 42/77  brain]
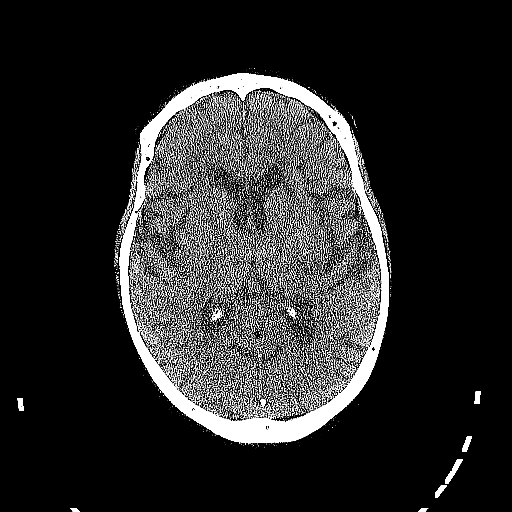
[im 42/77  bone]
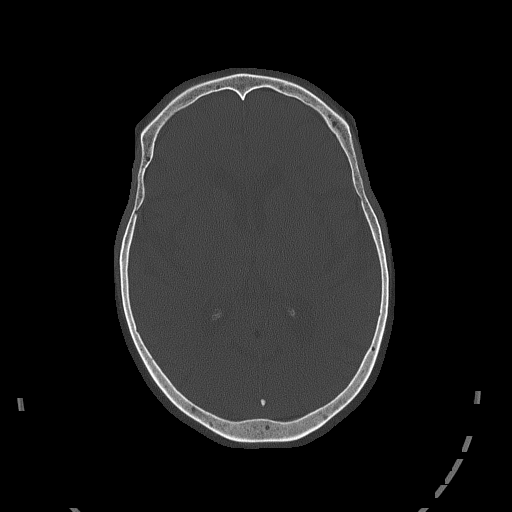
[im 54/77  brain]
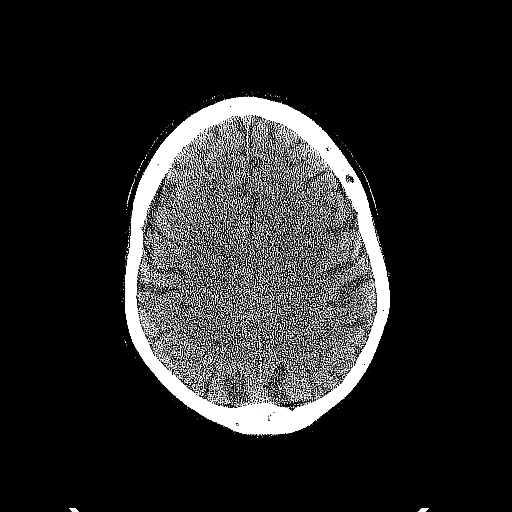
[im 61/77  brain]
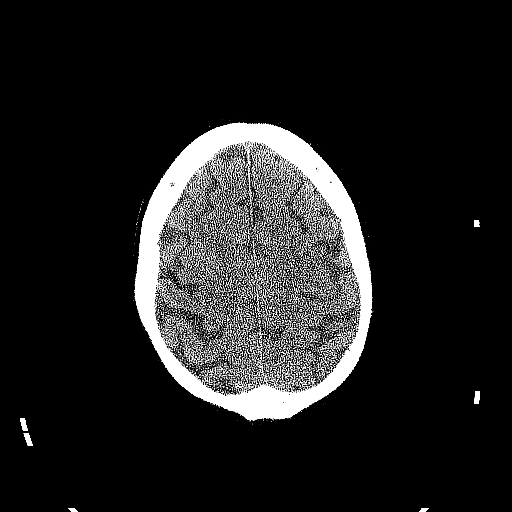
[im 69/77  brain]
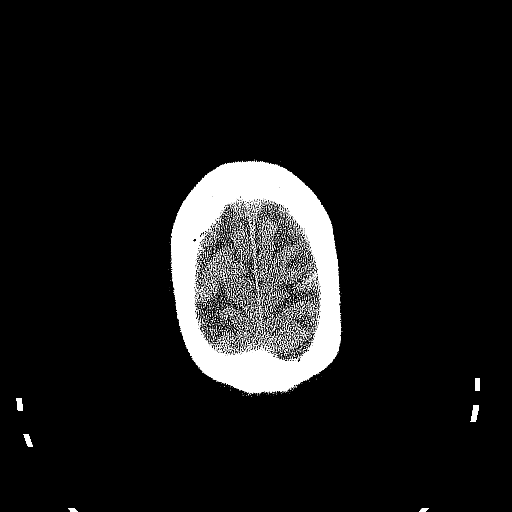

[Series 5: head 3.0 mpr cor · coronal · 0.33mm/px · 3 of 74 slices shown]
[im 25/74  brain]
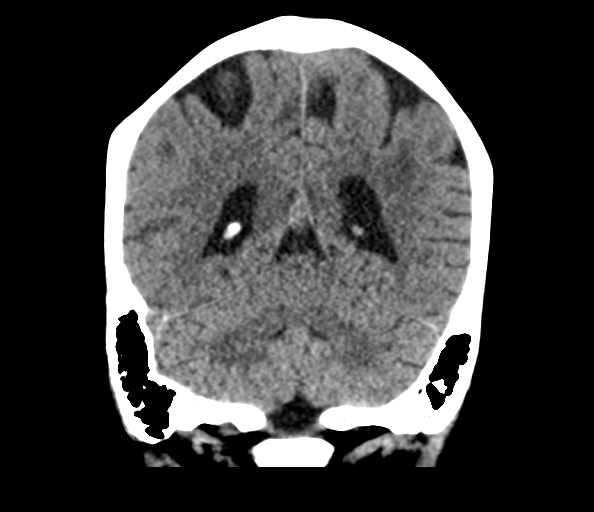
[im 33/74  brain]
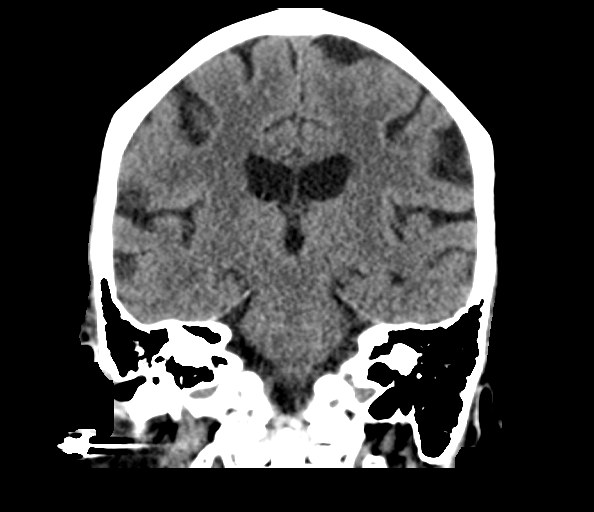
[im 41/74  brain]
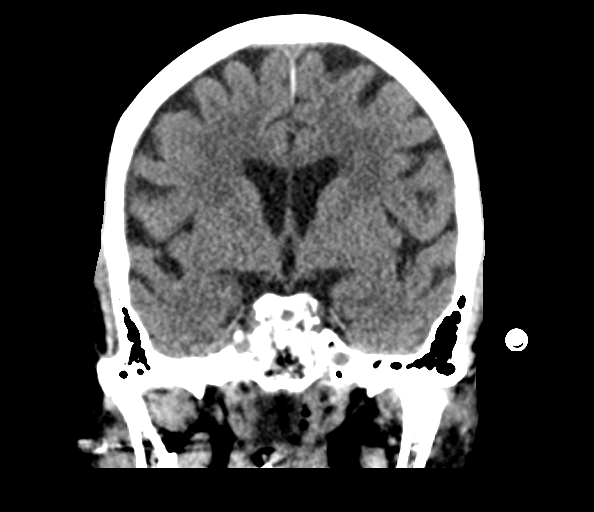

[Series 6: head 3.0 mpr sag · sagittal · 0.30mm/px · 3 of 62 slices shown]
[im 21/62  brain]
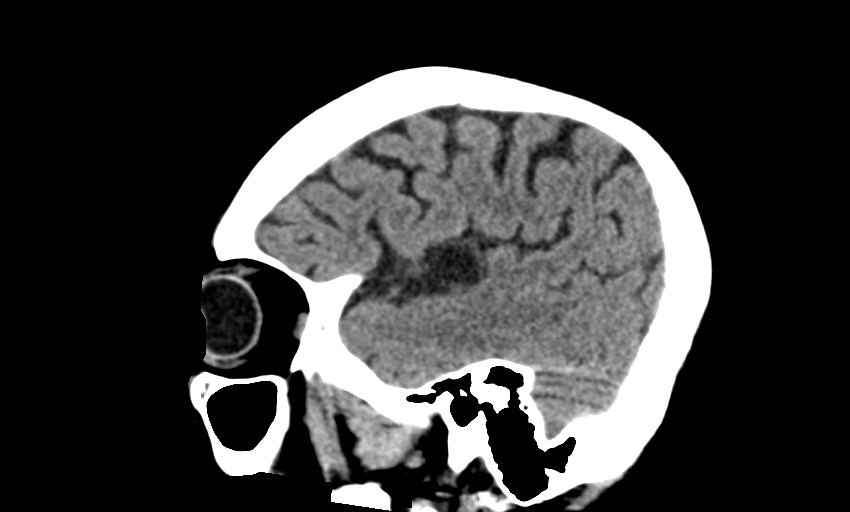
[im 31/62  brain]
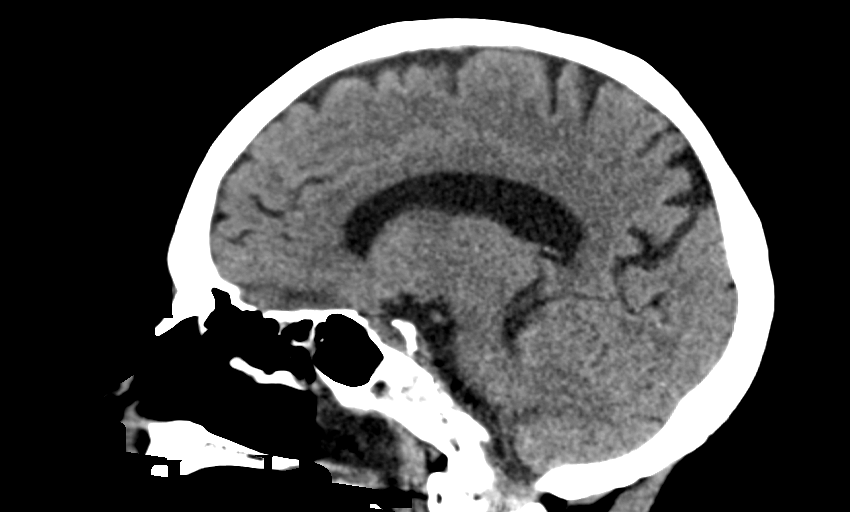
[im 41/62  brain]
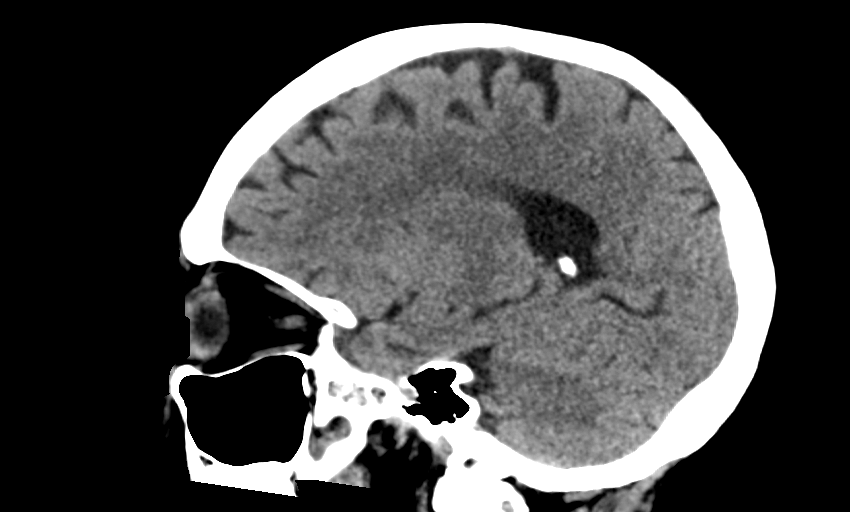

[14 of 47 positions shown; findings below may reference images not displayed]

FINDINGS: CT HEAD FINDINGS

Brain:

Cerebral ventricle sizes are concordant with the degree of cerebral
volume loss. Patchy and confluent areas of decreased attenuation are
noted throughout the deep and periventricular white matter of the
cerebral hemispheres bilaterally, compatible with chronic
microvascular ischemic disease.

No evidence of large-territorial acute infarction. No parenchymal
hemorrhage. No mass lesion. No extra-axial collection.

No mass effect or midline shift. No hydrocephalus. Basilar cisterns
are patent.

Vascular: No hyperdense vessel.

Skull: No acute fracture or focal lesion.

Sinuses/Orbits: Paranasal sinuses and mastoid air cells are clear.
The orbits are unremarkable.

Other: Secretions within the naso-oropharynx.

CT CERVICAL SPINE FINDINGS

Alignment: Normal.

Skull base and vertebrae: Densely sclerotic lesion within the right
C2 articular facet and C2 vertebral body ([DATE]) that likely
represent bone islands and are not noted to be hypermetabolic on the
PET CT 01/13/2021. No acute fracture. No aggressive appearing focal
osseous lesion or focal pathologic process.

Soft tissues and spinal canal: No prevertebral fluid or swelling. No
visible canal hematoma.

Upper chest: Unremarkable.

Other: Partially visualized endotracheal tube within the trachea and
enteric tube within the esophagus. Bilateral, left greater than
right, mandibular lytic and blastic lesions ([DATE], 9).
IMPRESSION: 1. No acute intracranial abnormality.
2. No acute displaced fracture or traumatic listhesis of the
cervical spine.
3. Indeterminate bilateral, left greater than right, mandibular
lytic and blastic lesions. Recommend nonemergent CT max face for
further evaluation.

## 2021-10-14 IMAGING — US US ABDOMEN LIMITED RUQ/ASCITES
1 series · 14 of 25 positions shown · non-contrast
Comparison: Abdominal CT from yesterday

CLINICAL DATA: Septic shock

EXAM:
ULTRASOUND ABDOMEN LIMITED RIGHT UPPER QUADRANT

[Series 1: us abdomen limited ruq (liver/gb) · 42 acquisitions, 14 frames shown]
[im 1/42]
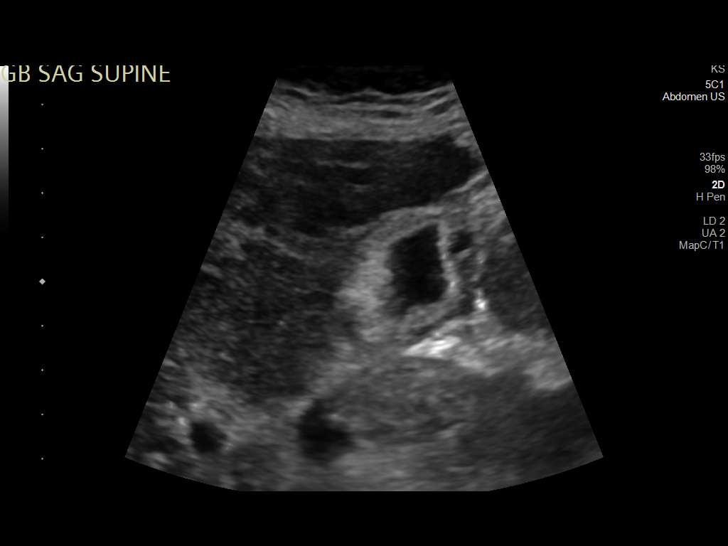
[im 4/42]
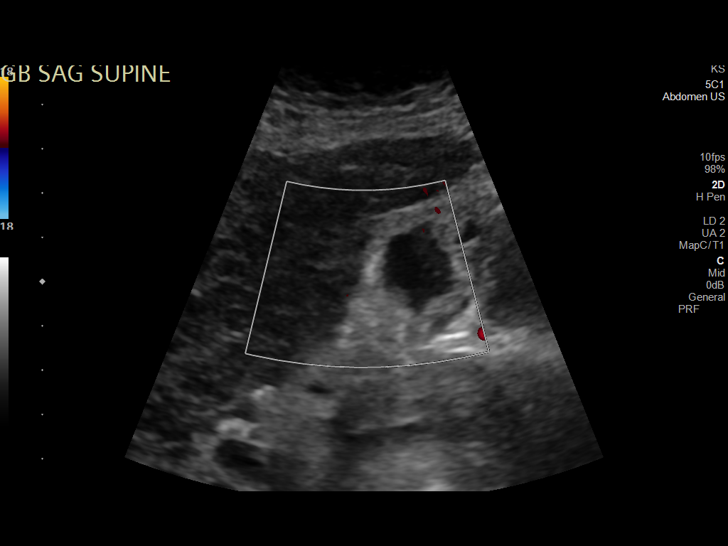
[im 7/42]
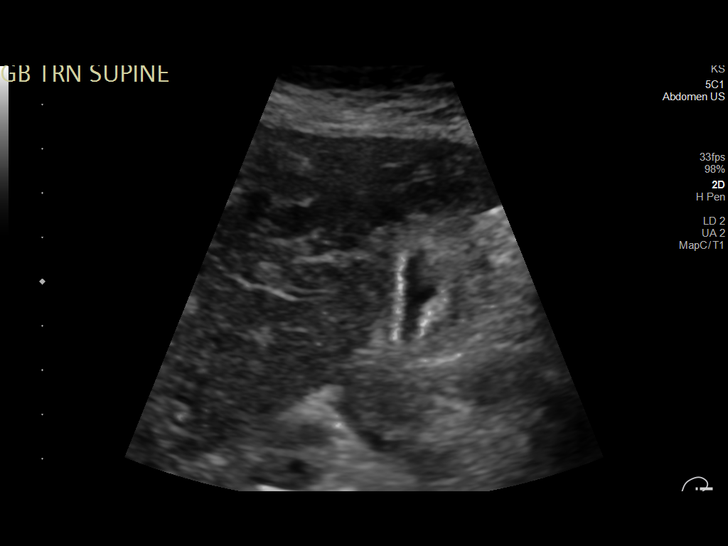
[im 11/42]
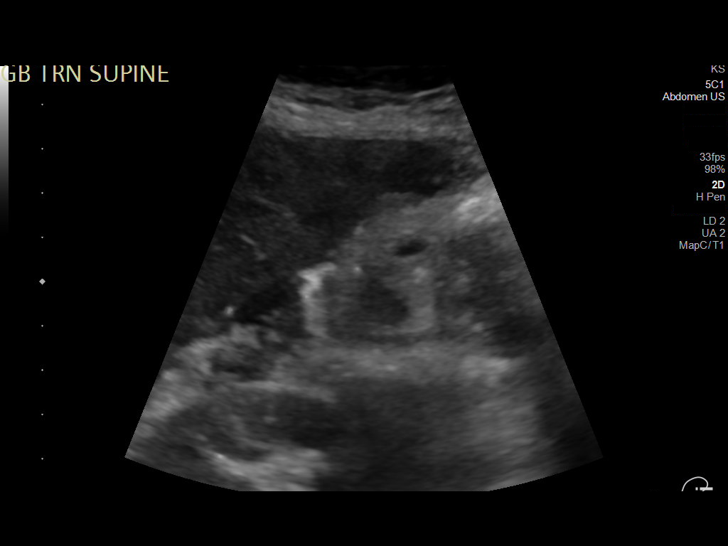
[im 14/42]
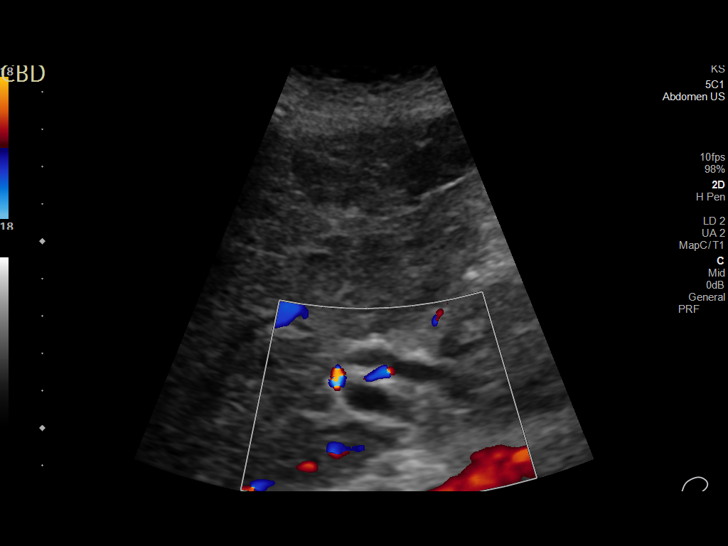
[im 16/42]
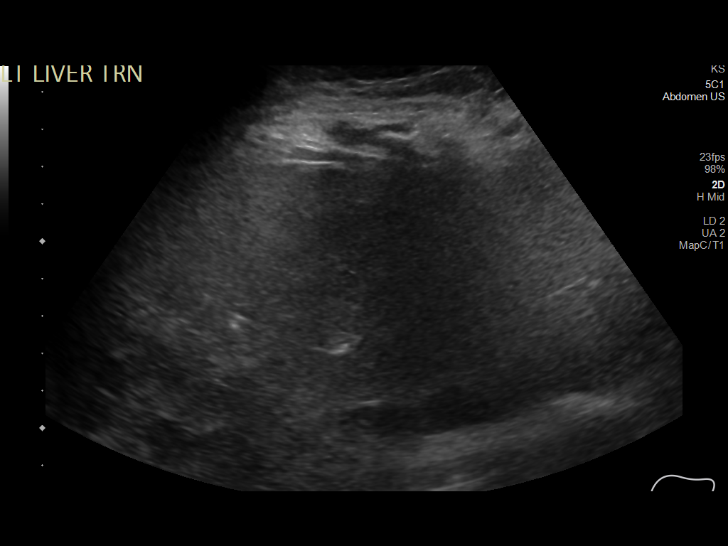
[im 19/42]
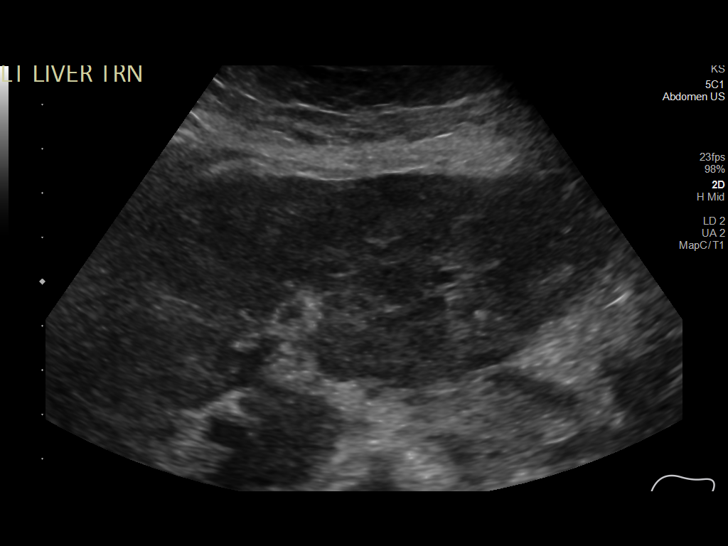
[im 23/42]
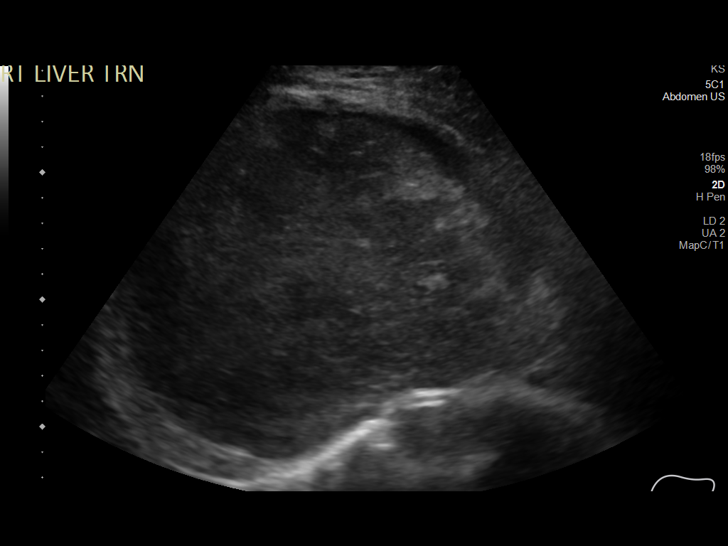
[im 26/42]
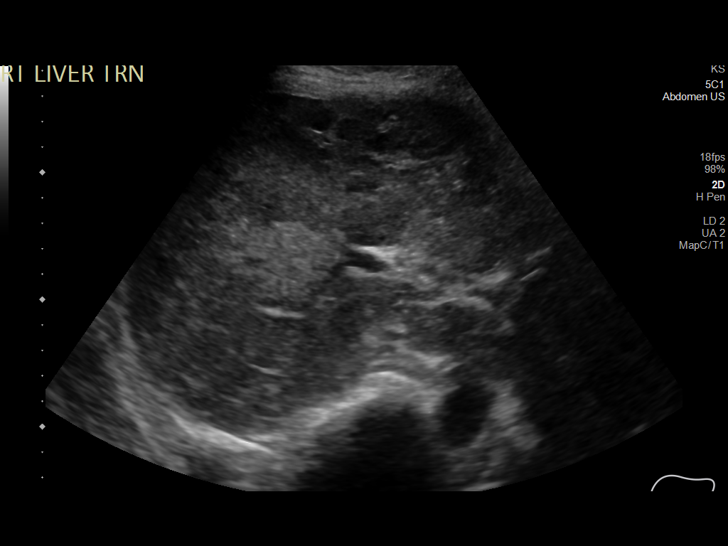
[im 28/42]
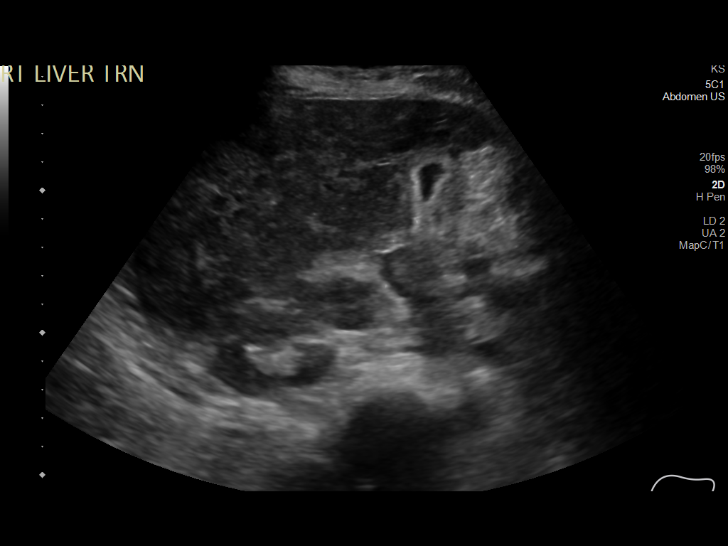
[im 31/42]
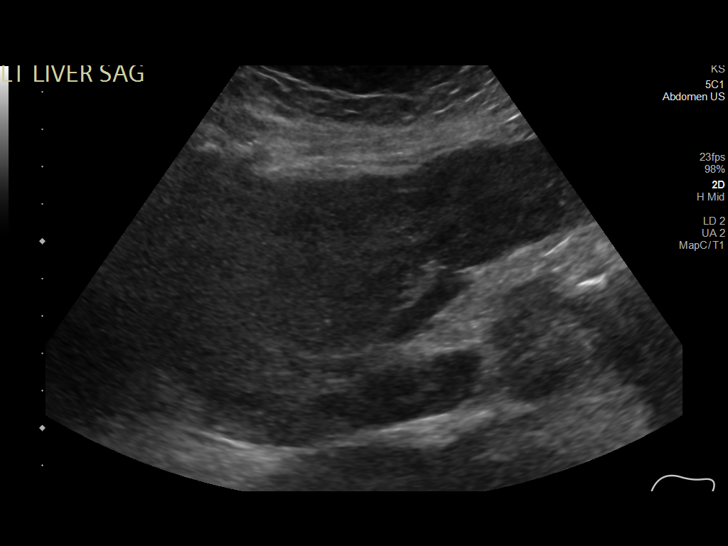
[im 35/42]
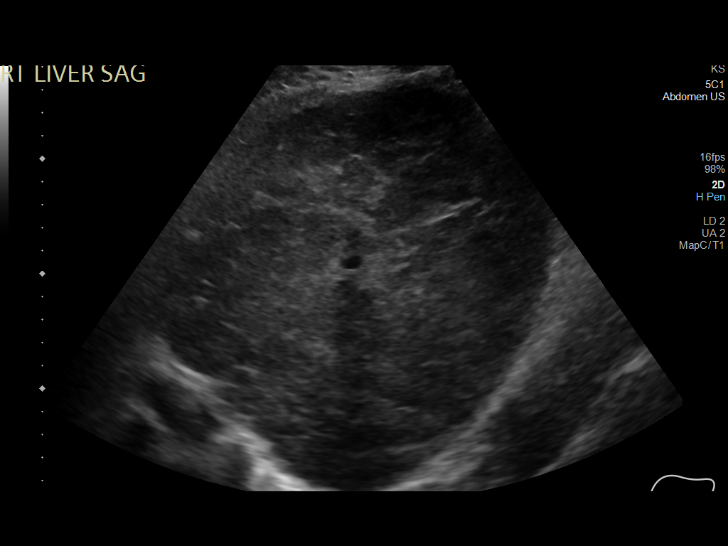
[im 38/42]
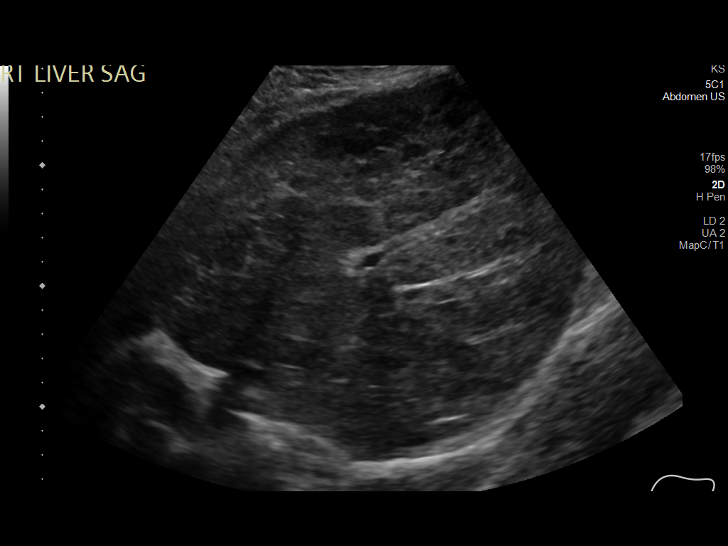
[im 42/42]
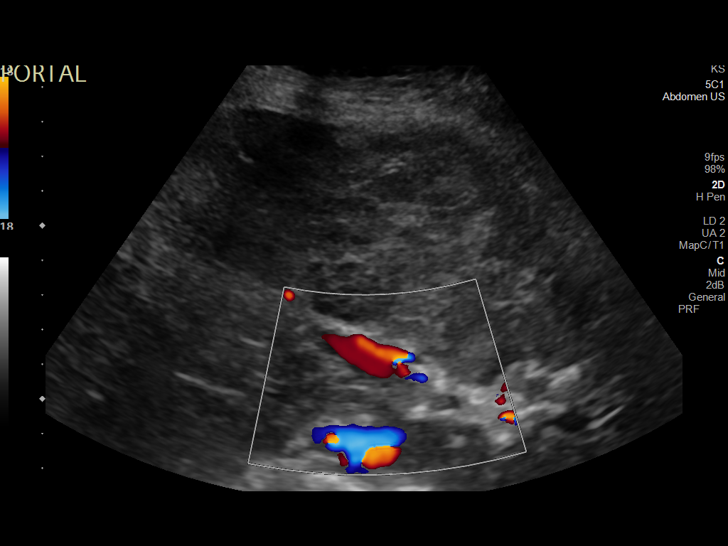

[14 of 25 positions shown; findings below may reference images not displayed]

FINDINGS: Gallbladder:

Prominent gallbladder wall thickness in the setting of under
distension. Negative for stone. Murphy sign cannot be assessed in
the setting of intubation.

Common bile duct:

Diameter: 6 mm.  Where visualized, no filling defect.

Liver:

Heterogeneous liver with lobulation. The liver has a normal
appearance on 04/17/2020 MRI. There is known widespread metastatic
disease. Portal vein is patent on color Doppler imaging with normal
direction of blood flow towards the liver.
IMPRESSION: 1. Thick walled gallbladder which is attributed to under distension.
Negative for gallbladder calculus.
2. Extensive hepatic metastatic disease.

## 2021-12-16 ENCOUNTER — Encounter (HOSPITAL_COMMUNITY): Payer: Self-pay

## 2023-03-10 ENCOUNTER — Other Ambulatory Visit: Payer: Self-pay | Admitting: Hematology and Oncology
